# Patient Record
Sex: Female | Born: 1955
Health system: Southern US, Community
[De-identification: ages and names within clinical notes are randomized; demographics above are authoritative.]

## PROBLEM LIST (undated history)

## (undated) DIAGNOSIS — F329 Major depressive disorder, single episode, unspecified: Secondary | ICD-10-CM

## (undated) DIAGNOSIS — F32A Depression, unspecified: Secondary | ICD-10-CM

## (undated) DIAGNOSIS — G4730 Sleep apnea, unspecified: Secondary | ICD-10-CM

## (undated) DIAGNOSIS — E039 Hypothyroidism, unspecified: Secondary | ICD-10-CM

## (undated) DIAGNOSIS — E079 Disorder of thyroid, unspecified: Secondary | ICD-10-CM

## (undated) DIAGNOSIS — E7800 Pure hypercholesterolemia, unspecified: Secondary | ICD-10-CM

## (undated) DIAGNOSIS — E119 Type 2 diabetes mellitus without complications: Secondary | ICD-10-CM

## (undated) DIAGNOSIS — T8859XA Other complications of anesthesia, initial encounter: Secondary | ICD-10-CM

## (undated) DIAGNOSIS — I10 Essential (primary) hypertension: Secondary | ICD-10-CM

## (undated) DIAGNOSIS — T4145XA Adverse effect of unspecified anesthetic, initial encounter: Secondary | ICD-10-CM

## (undated) DIAGNOSIS — I639 Cerebral infarction, unspecified: Secondary | ICD-10-CM

## (undated) HISTORY — PX: APPENDECTOMY: SHX54

## (undated) HISTORY — PX: OTHER SURGICAL HISTORY: SHX169

## (undated) HISTORY — PX: TRACHEOSTOMY: SUR1362

## (undated) HISTORY — PX: ABDOMINAL HYSTERECTOMY: SHX81

---

## 1997-11-30 ENCOUNTER — Emergency Department (HOSPITAL_COMMUNITY): Admission: EM | Admit: 1997-11-30 | Discharge: 1997-11-30 | Payer: Self-pay | Admitting: Emergency Medicine

## 2010-05-19 ENCOUNTER — Emergency Department (HOSPITAL_COMMUNITY)
Admission: EM | Admit: 2010-05-19 | Discharge: 2010-05-19 | Payer: Self-pay | Source: Home / Self Care | Admitting: Emergency Medicine

## 2012-06-06 ENCOUNTER — Emergency Department (HOSPITAL_COMMUNITY)
Admission: EM | Admit: 2012-06-06 | Discharge: 2012-06-06 | Disposition: A | Discharge status: Home / Self Care | Payer: Self-pay | Attending: Emergency Medicine | Admitting: Emergency Medicine

## 2012-06-06 ENCOUNTER — Encounter (HOSPITAL_COMMUNITY): Payer: Self-pay | Admitting: Family Medicine

## 2012-06-06 ENCOUNTER — Emergency Department (HOSPITAL_COMMUNITY): Payer: Self-pay

## 2012-06-06 DIAGNOSIS — Z8639 Personal history of other endocrine, nutritional and metabolic disease: Secondary | ICD-10-CM | POA: Insufficient documentation

## 2012-06-06 DIAGNOSIS — E7800 Pure hypercholesterolemia, unspecified: Secondary | ICD-10-CM | POA: Insufficient documentation

## 2012-06-06 DIAGNOSIS — Z862 Personal history of diseases of the blood and blood-forming organs and certain disorders involving the immune mechanism: Secondary | ICD-10-CM | POA: Insufficient documentation

## 2012-06-06 DIAGNOSIS — Z791 Long term (current) use of non-steroidal anti-inflammatories (NSAID): Secondary | ICD-10-CM | POA: Insufficient documentation

## 2012-06-06 DIAGNOSIS — Z79899 Other long term (current) drug therapy: Secondary | ICD-10-CM | POA: Insufficient documentation

## 2012-06-06 DIAGNOSIS — I10 Essential (primary) hypertension: Secondary | ICD-10-CM | POA: Insufficient documentation

## 2012-06-06 DIAGNOSIS — J40 Bronchitis, not specified as acute or chronic: Secondary | ICD-10-CM | POA: Insufficient documentation

## 2012-06-06 DIAGNOSIS — R609 Edema, unspecified: Secondary | ICD-10-CM | POA: Insufficient documentation

## 2012-06-06 DIAGNOSIS — R0602 Shortness of breath: Secondary | ICD-10-CM | POA: Insufficient documentation

## 2012-06-06 HISTORY — DX: Disorder of thyroid, unspecified: E07.9

## 2012-06-06 HISTORY — DX: Essential (primary) hypertension: I10

## 2012-06-06 HISTORY — DX: Pure hypercholesterolemia, unspecified: E78.00

## 2012-06-06 LAB — POCT I-STAT, CHEM 8
BUN: 17 mg/dL (ref 6–23)
Calcium, Ion: 1.21 mmol/L (ref 1.12–1.23)
Chloride: 107 mEq/L (ref 96–112)
Creatinine, Ser: 0.9 mg/dL (ref 0.50–1.10)
Glucose, Bld: 79 mg/dL (ref 70–99)
HCT: 45 % (ref 36.0–46.0)
Hemoglobin: 15.3 g/dL — ABNORMAL HIGH (ref 12.0–15.0)
Potassium: 4.3 mEq/L (ref 3.5–5.1)
Sodium: 136 mEq/L (ref 135–145)
TCO2: 26 mmol/L (ref 0–100)

## 2012-06-06 LAB — PRO B NATRIURETIC PEPTIDE: Pro B Natriuretic peptide (BNP): 9.2 pg/mL (ref 0–125)

## 2012-06-06 MED ORDER — AZITHROMYCIN 250 MG PO TABS: 250.0000 mg | ORAL_TABLET | Freq: Every day | ORAL | Status: DC

## 2012-06-06 MED ORDER — HYDROCOD POLST-CHLORPHEN POLST 10-8 MG/5ML PO LQCR: 5.0000 mL | Freq: Two times a day (BID) | ORAL | Status: DC | PRN

## 2012-06-06 MED ORDER — LISINOPRIL-HYDROCHLOROTHIAZIDE 20-12.5 MG PO TABS: 1.0000 | ORAL_TABLET | Freq: Every day | ORAL | Status: DC

## 2012-06-06 NOTE — ED Provider Notes (Signed)
History   This chart was scribed for Nelia Shi, MD, by Frederik Pear, ER scribe. The patient was seen in room TR05C/TR05C and the patient's care was started at 1318.   CSN: [id number]  Arrival date & time 06/06/12  1305   First MD Initiated Contact with Patient 06/06/12 1318      Chief Complaint  Patient presents with  . Cough    (Consider location/radiation/quality/duration/timing/severity/associated sxs/prior treatment) HPI Comments: Morgan Collins is a 56 y.o. female with a h/o of hypertension and high cholesterol who presents to the Emergency Department complaining of constant, moderate productive cough with associated SOB and abdominal pain that began 2 weeks ago. She also states that when the symptoms because she had associated wheezing, which has since resolved. She also reports associated lower leg edema, and has not been taking her hypertension medication. In ED her BP is 172/115. She reports that she is a nonsmoker. She states that she currently sleeps on two pillows at night and has for a long time because one pillow is uncomfortable.  No current PCP. Dr. Renaye Rakers is previous PCP.   Past Medical History  Diagnosis Date  . Hypertension   . Thyroid disease   . High cholesterol     History reviewed. No pertinent past surgical history.  History reviewed. No pertinent family history.  History  Substance Use Topics  . Smoking status: Never Smoker   . Smokeless tobacco: Not on file  . Alcohol Use: No    OB History    Grav Para Term Preterm Abortions TAB SAB Ect Mult Living                  Review of Systems A complete 10 system review of systems was obtained and all systems are negative except as noted in the HPI and PMH.   Allergies  Review of patient's allergies indicates no known allergies.  Home Medications   Current Outpatient Rx  Name  Route  Sig  Dispense  Refill  . BENAZEPRIL HCL 20 MG PO TABS   Oral   Take 20 mg by mouth at bedtime.         . CORICIDIN HBP CONGESTION/COUGH PO   Oral   Take 1 tablet by mouth 2 (two) times daily as needed. For congestion & cold         . NAPROXEN SODIUM 220 MG PO TABS   Oral   Take 220 mg by mouth 2 (two) times daily with a meal.         . AZITHROMYCIN 250 MG PO TABS   Oral   Take 1 tablet (250 mg total) by mouth daily. Take first 2 tablets together, then 1 every day until finished.   6 tablet   0   . LISINOPRIL-HYDROCHLOROTHIAZIDE 20-12.5 MG PO TABS   Oral   Take 1 tablet by mouth daily.   30 tablet   0     BP 172/115  Temp 98.4 F (36.9 C) (Oral)  Resp 22  SpO2 97%  Physical Exam  Nursing note and vitals reviewed. Constitutional: She is oriented to person, place, and time. She appears well-developed and well-nourished. No distress.  HENT:  Head: Normocephalic and atraumatic.  Eyes: Pupils are equal, round, and reactive to light.  Neck: Normal range of motion.  Cardiovascular: Normal rate and intact distal pulses.   Pulmonary/Chest: No respiratory distress. She has no wheezes. She has no rales.  Abdominal: Normal appearance. She exhibits no distension.  Musculoskeletal: Normal range of motion.  Neurological: She is alert and oriented to person, place, and time. No cranial nerve deficit.  Skin: Skin is warm and dry. No rash noted.  Psychiatric: She has a normal mood and affect. Her behavior is normal.    ED Course  Procedures (including critical care time)  DIAGNOSTIC STUDIES: Oxygen Saturation is 97% on room air, normal by my interpretation.    COORDINATION OF CARE:  14:21- Discussed planned course of treatment with the patient, including blood work, who is agreeable at this time.  Results for orders placed during the hospital encounter of 06/06/12  PRO B NATRIURETIC PEPTIDE      Component Value Range   Pro B Natriuretic peptide (BNP) 9.2  0 - 125 pg/mL  POCT I-STAT, CHEM 8      Component Value Range   Sodium 136  135 - 145 mEq/L   Potassium 4.3   3.5 - 5.1 mEq/L   Chloride 107  96 - 112 mEq/L   BUN 17  6 - 23 mg/dL   Creatinine, Ser 1.61  0.50 - 1.10 mg/dL   Glucose, Bld 79  70 - 99 mg/dL   Calcium, Ion 0.96  0.45 - 1.23 mmol/L   TCO2 26  0 - 100 mmol/L   Hemoglobin 15.3 (*) 12.0 - 15.0 g/dL   HCT 40.9  81.1 - 91.4 %     Labs Reviewed  POCT I-STAT, CHEM 8 - Abnormal; Notable for the following:    Hemoglobin 15.3 (*)     All other components within normal limits  PRO B NATRIURETIC PEPTIDE   Dg Chest 2 View  06/06/2012  *RADIOLOGY REPORT*  Clinical Data: Cough, wheezing, shortness of breath  CHEST - 2 VIEW  Comparison: None.  Findings: Borderline cardiomegaly noted.  No acute infiltrate or pulmonary edema.  Central mild bronchitic changes.  Mild degenerative changes thoracic spine.  IMPRESSION: No acute infiltrate or pulmonary edema.  Central mild bronchitic changes.   Original Report Authenticated By: Natasha Mead, M.D.      1. Bronchitis   2. Hypertension       MDM  I personally performed the services described in this documentation, which was scribed in my presence. The recorded information has been reviewed and considered.      Nelia Shi, MD 06/06/12 [phone]

## 2012-06-06 NOTE — ED Notes (Signed)
Per pt sts productive cough x 2 weeks. sts also she has been out of her BP meds for a while.

## 2012-12-11 ENCOUNTER — Emergency Department (HOSPITAL_COMMUNITY)
Admission: EM | Admit: 2012-12-11 | Discharge: 2012-12-11 | Disposition: A | Discharge status: Home / Self Care | Payer: BC Managed Care – PPO | Attending: Emergency Medicine | Admitting: Emergency Medicine

## 2012-12-11 ENCOUNTER — Emergency Department (HOSPITAL_COMMUNITY)
Admission: EM | Admit: 2012-12-11 | Discharge: 2012-12-11 | Disposition: A | Discharge status: Other Acute Inpatient Hospital | Payer: BC Managed Care – PPO | Source: Home / Self Care | Attending: Emergency Medicine | Admitting: Emergency Medicine

## 2012-12-11 ENCOUNTER — Encounter (HOSPITAL_COMMUNITY): Payer: Self-pay | Admitting: Emergency Medicine

## 2012-12-11 ENCOUNTER — Emergency Department (HOSPITAL_COMMUNITY): Payer: BC Managed Care – PPO

## 2012-12-11 ENCOUNTER — Encounter (HOSPITAL_COMMUNITY): Payer: Self-pay

## 2012-12-11 DIAGNOSIS — R079 Chest pain, unspecified: Secondary | ICD-10-CM

## 2012-12-11 DIAGNOSIS — R0609 Other forms of dyspnea: Secondary | ICD-10-CM

## 2012-12-11 DIAGNOSIS — R110 Nausea: Secondary | ICD-10-CM | POA: Insufficient documentation

## 2012-12-11 DIAGNOSIS — Z862 Personal history of diseases of the blood and blood-forming organs and certain disorders involving the immune mechanism: Secondary | ICD-10-CM | POA: Insufficient documentation

## 2012-12-11 DIAGNOSIS — R0602 Shortness of breath: Secondary | ICD-10-CM | POA: Insufficient documentation

## 2012-12-11 DIAGNOSIS — R0600 Dyspnea, unspecified: Secondary | ICD-10-CM

## 2012-12-11 DIAGNOSIS — Z791 Long term (current) use of non-steroidal anti-inflammatories (NSAID): Secondary | ICD-10-CM | POA: Insufficient documentation

## 2012-12-11 DIAGNOSIS — R609 Edema, unspecified: Secondary | ICD-10-CM

## 2012-12-11 DIAGNOSIS — Z79899 Other long term (current) drug therapy: Secondary | ICD-10-CM | POA: Insufficient documentation

## 2012-12-11 DIAGNOSIS — R0989 Other specified symptoms and signs involving the circulatory and respiratory systems: Secondary | ICD-10-CM

## 2012-12-11 DIAGNOSIS — R0789 Other chest pain: Secondary | ICD-10-CM | POA: Insufficient documentation

## 2012-12-11 DIAGNOSIS — Z792 Long term (current) use of antibiotics: Secondary | ICD-10-CM | POA: Insufficient documentation

## 2012-12-11 DIAGNOSIS — Z8639 Personal history of other endocrine, nutritional and metabolic disease: Secondary | ICD-10-CM | POA: Insufficient documentation

## 2012-12-11 DIAGNOSIS — R61 Generalized hyperhidrosis: Secondary | ICD-10-CM | POA: Insufficient documentation

## 2012-12-11 DIAGNOSIS — I10 Essential (primary) hypertension: Secondary | ICD-10-CM | POA: Insufficient documentation

## 2012-12-11 DIAGNOSIS — Z8659 Personal history of other mental and behavioral disorders: Secondary | ICD-10-CM | POA: Insufficient documentation

## 2012-12-11 HISTORY — DX: Major depressive disorder, single episode, unspecified: F32.9

## 2012-12-11 HISTORY — DX: Depression, unspecified: F32.A

## 2012-12-11 LAB — COMPREHENSIVE METABOLIC PANEL
ALT: 18 U/L (ref 0–35)
AST: 18 U/L (ref 0–37)
Albumin: 3.5 g/dL (ref 3.5–5.2)
Alkaline Phosphatase: 95 U/L (ref 39–117)
BUN: 18 mg/dL (ref 6–23)
CO2: 26 mEq/L (ref 19–32)
Calcium: 10.7 mg/dL — ABNORMAL HIGH (ref 8.4–10.5)
Chloride: 104 mEq/L (ref 96–112)
Creatinine, Ser: 0.84 mg/dL (ref 0.50–1.10)
GFR calc Af Amer: 88 mL/min — ABNORMAL LOW (ref >90)
GFR calc non Af Amer: 76 mL/min — ABNORMAL LOW (ref >90)
Glucose, Bld: 115 mg/dL — ABNORMAL HIGH (ref 70–99)
Potassium: 3.9 mEq/L (ref 3.5–5.1)
Sodium: 139 mEq/L (ref 135–145)
Total Bilirubin: 0.1 mg/dL — ABNORMAL LOW (ref 0.3–1.2)
Total Protein: 7.5 g/dL (ref 6.0–8.3)

## 2012-12-11 LAB — CBC WITH DIFFERENTIAL/PLATELET
Basophils Absolute: 0 10*3/uL (ref 0.0–0.1)
Basophils Relative: 0 % (ref 0–1)
Eosinophils Absolute: 0.1 10*3/uL (ref 0.0–0.7)
Eosinophils Relative: 1 % (ref 0–5)
HCT: 39 % (ref 36.0–46.0)
Hemoglobin: 12.8 g/dL (ref 12.0–15.0)
Lymphocytes Relative: 31 % (ref 12–46)
Lymphs Abs: 2.8 10*3/uL (ref 0.7–4.0)
MCH: 28.5 pg (ref 26.0–34.0)
MCHC: 32.8 g/dL (ref 30.0–36.0)
MCV: 86.9 fL (ref 78.0–100.0)
Monocytes Absolute: 0.7 10*3/uL (ref 0.1–1.0)
Monocytes Relative: 8 % (ref 3–12)
Neutro Abs: 5.3 10*3/uL (ref 1.7–7.7)
Neutrophils Relative %: 60 % (ref 43–77)
Platelets: 238 10*3/uL (ref 150–400)
RBC: 4.49 MIL/uL (ref 3.87–5.11)
RDW: 14.3 % (ref 11.5–15.5)
WBC: 8.8 10*3/uL (ref 4.0–10.5)

## 2012-12-11 LAB — POCT I-STAT TROPONIN I: Troponin i, poc: 0.01 ng/mL (ref 0.00–0.08)

## 2012-12-11 LAB — PRO B NATRIURETIC PEPTIDE: Pro B Natriuretic peptide (BNP): 20.7 pg/mL (ref 0–125)

## 2012-12-11 MED ORDER — SODIUM CHLORIDE 0.9 % IV SOLN
Freq: Once | INTRAVENOUS | Status: AC
  Administered 2012-12-11: 19:00:00 via INTRAVENOUS

## 2012-12-11 MED ORDER — LEVOTHYROXINE SODIUM 50 MCG PO TABS: 75.0000 ug | ORAL_TABLET | Freq: Every day | ORAL | Status: DC

## 2012-12-11 MED ORDER — LISINOPRIL-HYDROCHLOROTHIAZIDE 20-12.5 MG PO TABS: 1.0000 | ORAL_TABLET | Freq: Every day | ORAL | Status: DC

## 2012-12-11 NOTE — ED Notes (Addendum)
Reports 2 week duration discomfort left mid chest/clavical area , worse w exertion, movement of arms , deep breath. C/o swelling in both legs that usually goes away w rest and elevation, but not so much lately . NAD at present. Also co pain and numbness  in left thumb States she has not taken her BP or thyroid medication in at least 6 months

## 2012-12-11 NOTE — ED Notes (Signed)
Patient is an [facility] transfer, complaining of chest pain for last two weeks, patient is chest pain free at this time.  Patient at [facility] with hypertension 136/106.  Patient is 137/86 at this time of transfer.  Patient does not have any complaints at this time.

## 2012-12-11 NOTE — ED Notes (Signed)
Pt denies any questions upon discharge. 

## 2012-12-11 NOTE — ED Provider Notes (Signed)
I saw and evaluated the patient, reviewed the resident's note and I agree with the findings and plan.   Pt with h/o HTN, no h/o ACS, intermittent, atypical CP for past several weeks, seen at urgent care, sent to the ED for further eval.  Pt has no sig ST or T wave abn';s to suggest acute ischemia, troponin is negative.  Pt denies any CP currently.  I agree with Dr. Fayrene Fearing' ECG interpretation.  Will recommend close follow up with cardiology and will refer to [facility] cardiology.  Pt and family are comfortable going home, watchful waiting over weekend, can return if worse, will follow up with cardiology.      Gavin Pound. Cordell Coke, MD 12/11/12 4098

## 2012-12-11 NOTE — ED Provider Notes (Signed)
Medical screening examination/treatment/procedure(s) were performed by non-physician practitioner and as supervising physician I was immediately available for consultation/collaboration.  Leslee Home, M.D.  Reuben Likes, MD 12/11/12 [phone]

## 2012-12-11 NOTE — ED Provider Notes (Signed)
History    CSN: [id number] Arrival date & time 12/11/12  1954  First MD Initiated Contact with Patient 12/11/12 2019     Chief Complaint  Patient presents with  . Chest Pain  . Hypertension   (Consider location/radiation/quality/duration/timing/severity/associated sxs/prior Treatment) Patient is a 57 y.o. female presenting with chest pain. The history is provided by the patient.  Chest Pain Pain location:  L chest Pain quality: aching   Pain radiates to:  Does not radiate Pain radiates to the back: no   Pain severity:  Mild Onset quality:  Gradual Duration:  2 weeks Timing:  Intermittent Progression:  Worsening (becmoing more frequent. ) Chronicity:  New Context comment:  Pt cannot associated with anything Relieved by:  Nothing Worsened by:  Nothing tried Ineffective treatments:  None tried Associated symptoms: diaphoresis, nausea and shortness of breath (with exertion)   Associated symptoms: no abdominal pain, no cough, no dizziness, no fever, not vomiting and no weakness    Past Medical History  Diagnosis Date  . Hypertension   . Thyroid disease   . High cholesterol   . Depressed    History reviewed. No pertinent past surgical history. No family history on file. History  Substance Use Topics  . Smoking status: Never Smoker   . Smokeless tobacco: Not on file  . Alcohol Use: No   OB History   Grav Para Term Preterm Abortions TAB SAB Ect Mult Living                 Review of Systems  Constitutional: Positive for diaphoresis. Negative for fever and chills.  HENT: Negative for congestion and rhinorrhea.   Respiratory: Positive for shortness of breath (with exertion). Negative for cough and chest tightness.   Cardiovascular: Positive for chest pain. Negative for leg swelling.  Gastrointestinal: Positive for nausea. Negative for vomiting, abdominal pain, diarrhea and rectal pain.  Genitourinary: Negative for dysuria.  Neurological: Negative for dizziness and  weakness.  All other systems reviewed and are negative.    Allergies  Review of patient's allergies indicates no known allergies.  Home Medications   Current Outpatient Rx  Name  Route  Sig  Dispense  Refill  . azithromycin (ZITHROMAX) 250 MG tablet   Oral   Take 1 tablet (250 mg total) by mouth daily. Take first 2 tablets together, then 1 every day until finished.   6 tablet   0   . benazepril (LOTENSIN) 20 MG tablet   Oral   Take 20 mg by mouth at bedtime.         . chlorpheniramine-HYDROcodone (TUSSIONEX PENNKINETIC ER) 10-8 MG/5ML LQCR   Oral   Take 5 mLs by mouth every 12 (twelve) hours as needed.   140 mL   0   . Dextromethorphan-Guaifenesin (CORICIDIN HBP CONGESTION/COUGH PO)   Oral   Take 1 tablet by mouth 2 (two) times daily as needed. For congestion & cold         . lisinopril-hydrochlorothiazide (PRINZIDE) 20-12.5 MG per tablet   Oral   Take 1 tablet by mouth daily.   30 tablet   0   . naproxen sodium (ANAPROX) 220 MG tablet   Oral   Take 220 mg by mouth 2 (two) times daily with a meal.          BP 153/110  Pulse 80  Temp(Src) 97.4 F (36.3 C) (Oral)  Resp 16  SpO2 97% Physical Exam  Nursing note and vitals reviewed. Constitutional: She is oriented  to person, place, and time. She appears well-developed and well-nourished. No distress.  HENT:  Head: Normocephalic and atraumatic.  Mouth/Throat: Oropharynx is clear and moist.  Eyes: EOM are normal. Pupils are equal, round, and reactive to light.  Neck: Normal range of motion. Neck supple.  Cardiovascular: Normal rate, regular rhythm and normal heart sounds.  Exam reveals no friction rub.   No murmur heard. Pulmonary/Chest: Effort normal and breath sounds normal. No respiratory distress. She has no wheezes. She has no rales.  Abdominal: Soft. There is no tenderness. There is no rebound and no guarding.  Obese.   Musculoskeletal: Normal range of motion. She exhibits edema (trace bilateral  pitting edema BLEs. ). She exhibits no tenderness.  Lymphadenopathy:    She has no cervical adenopathy.  Neurological: She is alert and oriented to person, place, and time.  Skin: Skin is warm and dry. No rash noted.  Psychiatric: She has a normal mood and affect. Her behavior is normal.    ED Course  Procedures (including critical care time) Labs Reviewed  COMPREHENSIVE METABOLIC PANEL - Abnormal; Notable for the following:    Glucose, Bld 115 (*)    Calcium 10.7 (*)    Total Bilirubin 0.1 (*)    GFR calc non Af Amer 76 (*)    GFR calc Af Amer 88 (*)    All other components within normal limits  CBC WITH DIFFERENTIAL  PRO B NATRIURETIC PEPTIDE  POCT I-STAT TROPONIN I   Dg Chest 2 View  12/11/2012   *RADIOLOGY REPORT*  Clinical Data: Chest pain, shortness of breath, lower extremity swelling  CHEST - 2 VIEW  Comparison: 06/06/2012  Findings:  Grossly unchanged borderline enlarged cardiac silhouette. Atherosclerotic calcifications within the aortic arch.  There is mild diffuse thickening of the pulmonary interstitium.  Grossly unchanged minimal left basilar linear heterogeneous opacities favored to represent atelectasis.  No focal airspace opacity.  No pleural effusion or pneumothorax.  No definite evidence of edema. Unchanged bones.  IMPRESSION: Mild bronchitic change without acute cardiopulmonary disease.   Original Report Authenticated By: Tacey Ruiz, MD   Date: 12/11/2012  Rate: 83  Rhythm: normal sinus rhythm  QRS Axis: normal  Intervals: normal  ST/T Wave abnormalities: normal  Conduction Disutrbances:none  Narrative Interpretation: LVH  Old EKG Reviewed: unchanged   1. Chest pain   2. Hypertension     MDM  84:88 PM 57 year old female with a history of hypertension, hyperlipidemia and hypothyroidism presenting with 2 weeks of intermittent left chest pain has progressively getting more frequent with nausea and diaphoresis. She also endorses worsening dyspnea on exertion.  She states she has to sleep with 3 pillows due to the fact she is "uncomfortable". Patient was in urgent care today and was sent to the ED for further evaluation of her chest pain and elevated blood pressure. She has not been on antihypertensives for the past 6 months as she lost her insurance. She appears well on exam. Lung sounds are clear. Will check labs, troponin, BNP, EKG and chest x-ray.  11:28 PM labs show no significant abnormality. Chest x-ray cleaned. Patient has remained chest pain-free while here. Do not feel delta tn is necessary at this has been ongoing for 2 weeks and she has had no pain here. Discussed with cardiology on the phone and they will followup. She is also provided with resources to call to establish with a primary physician. This was discussed with the patient, she voiced understanding. She was given strong return  precautions and discharged home in stable condition.  Caren Hazy, MD 12/11/12 [phone]

## 2012-12-11 NOTE — ED Provider Notes (Signed)
History    CSN: [id number] Arrival date & time 12/11/12  1701  First MD Initiated Contact with Patient 12/11/12 1824     Chief Complaint  Patient presents with  . Chest Pain   (Consider location/radiation/quality/duration/timing/severity/associated sxs/prior Treatment) HPI Comments: 57 year old morbidly obese female presents with left anterior chest pain for approximately 2 weeks. She describes it as both a tightness and pressure feeling. It is intermittent and exacerbated with exertion and sometimes movement. Is also complaining of exertional dyspnea and occasional unexpected diaphoresis. She has intermittent lower extremity edema that in the past few days the edema has not ameliorated as it usually does. Her history and risk factors include uncontrolled hypertension, peripheral edema, morbid obesity, 3 pillow orthopnea, borderline cardiomegaly, EKG suggestive of ventricular hypertrophy and sedentary lifestyle.  Patient is a 57 y.o. female presenting with chest pain.  Chest Pain Associated symptoms: fatigue and shortness of breath   Associated symptoms: no cough and no fever    Past Medical History  Diagnosis Date  . Hypertension   . Thyroid disease   . High cholesterol   . Depressed    History reviewed. No pertinent past surgical history. History reviewed. No pertinent family history. History  Substance Use Topics  . Smoking status: Never Smoker   . Smokeless tobacco: Not on file  . Alcohol Use: No   OB History   Grav Para Term Preterm Abortions TAB SAB Ect Mult Living                 Review of Systems  Constitutional: Positive for fatigue. Negative for fever and appetite change.  HENT: Negative.   Respiratory: Positive for chest tightness and shortness of breath. Negative for cough and wheezing.   Cardiovascular: Positive for chest pain and leg swelling.  Gastrointestinal: Negative.   Genitourinary: Negative.   Neurological: Negative.     Allergies  Review of  patient's allergies indicates no known allergies.  Home Medications   Current Outpatient Rx  Name  Route  Sig  Dispense  Refill  . azithromycin (ZITHROMAX) 250 MG tablet   Oral   Take 1 tablet (250 mg total) by mouth daily. Take first 2 tablets together, then 1 every day until finished.   6 tablet   0   . benazepril (LOTENSIN) 20 MG tablet   Oral   Take 20 mg by mouth at bedtime.         . chlorpheniramine-HYDROcodone (TUSSIONEX PENNKINETIC ER) 10-8 MG/5ML LQCR   Oral   Take 5 mLs by mouth every 12 (twelve) hours as needed.   140 mL   0   . Dextromethorphan-Guaifenesin (CORICIDIN HBP CONGESTION/COUGH PO)   Oral   Take 1 tablet by mouth 2 (two) times daily as needed. For congestion & cold         . lisinopril-hydrochlorothiazide (PRINZIDE) 20-12.5 MG per tablet   Oral   Take 1 tablet by mouth daily.   30 tablet   0   . naproxen sodium (ANAPROX) 220 MG tablet   Oral   Take 220 mg by mouth 2 (two) times daily with a meal.          BP 136/106  Pulse 90  Temp(Src) 97.8 F (36.6 C) (Oral)  Resp 12  SpO2 100% Physical Exam  Nursing note and vitals reviewed. Constitutional: She is oriented to person, place, and time. She appears well-nourished. No distress.  Morbidly obese. Currently sitting up on bed with relaxed posturing and in no acute distress.  She is obviously concerned about her symptoms and is currently having chest tightness in the left anterior chest now. No dyspnea at rest.  Eyes: Conjunctivae and EOM are normal.  Neck: Normal range of motion.  Cardiovascular: Normal rate, regular rhythm and normal heart sounds.   Pulmonary/Chest: Effort normal and breath sounds normal. No respiratory distress.  Musculoskeletal: She exhibits edema.  Neurological: She is alert and oriented to person, place, and time.  Skin: Skin is warm and dry. No rash noted. No erythema.  Psychiatric: She has a normal mood and affect.    ED Course  Procedures (including critical  care time) Labs Reviewed - No data to display No results found. No diagnosis found.  MDM  Patient is being transferred to [facility] for evaluation of left anterior chest pain for the past 2 weeks. Associated symptoms include orthopnea, exertional dyspnea, peripheral edema and diaphoresis She is currently stable with a normal O2 sat. Blood pressure 136/106  Hayden Rasmussen, NP 12/11/12 [place]  Hayden Rasmussen, NP 12/11/12 [phone]

## 2012-12-11 NOTE — ED Notes (Signed)
Report to ED  CN, Weston Anna; advised pending transfer ; called [facility], will send truck ASAP

## 2013-06-17 DIAGNOSIS — I639 Cerebral infarction, unspecified: Secondary | ICD-10-CM

## 2013-06-17 HISTORY — DX: Cerebral infarction, unspecified: I63.9

## 2013-06-27 ENCOUNTER — Emergency Department (INDEPENDENT_AMBULATORY_CARE_PROVIDER_SITE_OTHER)
Admission: EM | Admit: 2013-06-27 | Discharge: 2013-06-27 | Disposition: A | Discharge status: Home / Self Care | Payer: BC Managed Care – PPO | Source: Home / Self Care | Attending: Family Medicine | Admitting: Family Medicine

## 2013-06-27 ENCOUNTER — Encounter (HOSPITAL_COMMUNITY): Payer: Self-pay | Admitting: Emergency Medicine

## 2013-06-27 DIAGNOSIS — J40 Bronchitis, not specified as acute or chronic: Secondary | ICD-10-CM

## 2013-06-27 MED ORDER — ALBUTEROL SULFATE HFA 108 (90 BASE) MCG/ACT IN AERS: 1.0000 | INHALATION_SPRAY | Freq: Four times a day (QID) | RESPIRATORY_TRACT | Status: DC | PRN

## 2013-06-27 MED ORDER — AZITHROMYCIN 250 MG PO TABS: 250.0000 mg | ORAL_TABLET | Freq: Every day | ORAL | Status: DC

## 2013-06-27 NOTE — ED Notes (Signed)
Patient reports wheezing this morning, reports cough, productive cough-yellow phlegm.  Reports aching all over.  Denies fever.  Patient reports intermittent left chest pain, seen here for the same and sent to the ed for evaluation and told not heart related.  No chest pain now-pain is present with coughing spells per patient.  Pain is not dull, somewhat sharp.  Also c/o sneezing and poor appetite.

## 2013-06-27 NOTE — Discharge Instructions (Signed)

## 2013-06-27 NOTE — ED Notes (Signed)
Patient requested work note at discharge

## 2013-06-27 NOTE — ED Provider Notes (Signed)
CSN: [id number]     Arrival date & time 06/27/13  1221 History   First MD Initiated Contact with Patient 06/27/13 1444     Chief Complaint  Patient presents with  . URI  . Chest Pain   (Consider location/radiation/quality/duration/timing/severity/associated sxs/prior Treatment) Patient is a 58 y.o. female presenting with URI and chest pain. The history is provided by the patient. No language interpreter was used.  URI Presenting symptoms: congestion and cough   Severity:  Moderate Onset quality:  Sudden Timing:  Constant Progression:  Worsening Chronicity:  New Relieved by:  Nothing Worsened by:  Nothing tried Ineffective treatments:  None tried Associated symptoms: headaches and wheezing   Chest Pain Associated symptoms: cough and headache     Past Medical History  Diagnosis Date  . Hypertension   . Thyroid disease   . High cholesterol   . Depressed    No past surgical history on file. No family history on file. History  Substance Use Topics  . Smoking status: Never Smoker   . Smokeless tobacco: Not on file  . Alcohol Use: No   OB History   Grav Para Term Preterm Abortions TAB SAB Ect Mult Living                 Review of Systems  HENT: Positive for congestion.   Respiratory: Positive for cough and wheezing.   Cardiovascular: Positive for chest pain.  Neurological: Positive for headaches.  All other systems reviewed and are negative.    Allergies  Review of patient's allergies indicates no known allergies.  Home Medications   Current Outpatient Rx  Name  Route  Sig  Dispense  Refill  . guaiFENesin (MUCINEX) 600 MG 12 hr tablet   Oral   Take by mouth 2 (two) times daily.         Marland Kitchen albuterol (PROVENTIL HFA;VENTOLIN HFA) 108 (90 BASE) MCG/ACT inhaler   Inhalation   Inhale 1-2 puffs into the lungs every 6 (six) hours as needed for wheezing or shortness of breath.   1 Inhaler   0   . azithromycin (ZITHROMAX) 250 MG tablet   Oral   Take 1 tablet  (250 mg total) by mouth daily. Take first 2 tablets together, then 1 every day until finished.   6 tablet   0   . benazepril (LOTENSIN) 20 MG tablet   Oral   Take 20 mg by mouth at bedtime.         Marland Kitchen levothyroxine (SYNTHROID, LEVOTHROID) 50 MCG tablet   Oral   Take 1.5 tablets (75 mcg total) by mouth daily before breakfast.   45 tablet   1   . lisinopril-hydrochlorothiazide (PRINZIDE) 20-12.5 MG per tablet   Oral   Take 1 tablet by mouth daily.   30 tablet   0   . lisinopril-hydrochlorothiazide (ZESTORETIC) 20-12.5 MG per tablet   Oral   Take 1 tablet by mouth daily.   30 tablet   1    BP 101/70  Pulse 108  Temp(Src) 98.9 F (37.2 C) (Oral)  Resp 18  SpO2 100% Physical Exam  Nursing note and vitals reviewed. Constitutional: She is oriented to person, place, and time. She appears well-developed and well-nourished.  HENT:  Head: Normocephalic.  Right Ear: External ear normal.  Left Ear: External ear normal.  Eyes: EOM are normal. Pupils are equal, round, and reactive to light.  Neck: Normal range of motion.  Pulmonary/Chest: Effort normal and breath sounds normal.  Abdominal: Soft.  She exhibits no distension.  Musculoskeletal: Normal range of motion.  Neurological: She is alert and oriented to person, place, and time. She has normal reflexes.  Psychiatric: She has a normal mood and affect.    ED Course  Procedures (including critical care time) Labs Review Labs Reviewed - No data to display Imaging Review No results found.  EKG Interpretation    Date/Time:    Ventricular Rate:    PR Interval:    QRS Duration:   QT Interval:    QTC Calculation:   R Axis:     Text Interpretation:              MDM   1. Bronchitis    zithromax and albuterol    Fransico Meadow, PA-C 06/27/13 1457

## 2013-06-28 NOTE — ED Provider Notes (Signed)
Medical screening examination/treatment/procedure(s) were performed by resident physician or non-physician practitioner and as supervising physician I was immediately available for consultation/collaboration.   Pauline Good MD.   Billy Fischer, MD 06/28/13 [phone]

## 2013-07-27 ENCOUNTER — Inpatient Hospital Stay (HOSPITAL_COMMUNITY): Payer: BC Managed Care – PPO

## 2013-07-27 ENCOUNTER — Emergency Department (HOSPITAL_COMMUNITY): Payer: BC Managed Care – PPO

## 2013-07-27 ENCOUNTER — Inpatient Hospital Stay (HOSPITAL_COMMUNITY)
Admission: EM | Admit: 2013-07-27 | Discharge: 2013-08-04 | DRG: 023 | Disposition: A | Discharge status: Rehabilitation Facility | Payer: BC Managed Care – PPO | Attending: Neurology | Admitting: Neurology

## 2013-07-27 ENCOUNTER — Encounter (HOSPITAL_COMMUNITY): Payer: Self-pay | Admitting: Radiology

## 2013-07-27 DIAGNOSIS — G911 Obstructive hydrocephalus: Secondary | ICD-10-CM

## 2013-07-27 DIAGNOSIS — K5900 Constipation, unspecified: Secondary | ICD-10-CM | POA: Diagnosis not present

## 2013-07-27 DIAGNOSIS — G936 Cerebral edema: Secondary | ICD-10-CM | POA: Diagnosis present

## 2013-07-27 DIAGNOSIS — F329 Major depressive disorder, single episode, unspecified: Secondary | ICD-10-CM | POA: Diagnosis present

## 2013-07-27 DIAGNOSIS — R51 Headache: Secondary | ICD-10-CM | POA: Diagnosis present

## 2013-07-27 DIAGNOSIS — Z6841 Body Mass Index (BMI) 40.0 and over, adult: Secondary | ICD-10-CM

## 2013-07-27 DIAGNOSIS — N390 Urinary tract infection, site not specified: Secondary | ICD-10-CM

## 2013-07-27 DIAGNOSIS — I10 Essential (primary) hypertension: Secondary | ICD-10-CM | POA: Diagnosis present

## 2013-07-27 DIAGNOSIS — B964 Proteus (mirabilis) (morganii) as the cause of diseases classified elsewhere: Secondary | ICD-10-CM

## 2013-07-27 DIAGNOSIS — E7800 Pure hypercholesterolemia, unspecified: Secondary | ICD-10-CM | POA: Diagnosis present

## 2013-07-27 DIAGNOSIS — E785 Hyperlipidemia, unspecified: Secondary | ICD-10-CM | POA: Diagnosis present

## 2013-07-27 DIAGNOSIS — Z9119 Patient's noncompliance with other medical treatment and regimen: Secondary | ICD-10-CM

## 2013-07-27 DIAGNOSIS — F29 Unspecified psychosis not due to a substance or known physiological condition: Secondary | ICD-10-CM | POA: Diagnosis present

## 2013-07-27 DIAGNOSIS — Z91199 Patient's noncompliance with other medical treatment and regimen due to unspecified reason: Secondary | ICD-10-CM

## 2013-07-27 DIAGNOSIS — R471 Dysarthria and anarthria: Secondary | ICD-10-CM | POA: Diagnosis present

## 2013-07-27 DIAGNOSIS — I619 Nontraumatic intracerebral hemorrhage, unspecified: Principal | ICD-10-CM | POA: Diagnosis present

## 2013-07-27 DIAGNOSIS — E039 Hypothyroidism, unspecified: Secondary | ICD-10-CM | POA: Diagnosis present

## 2013-07-27 DIAGNOSIS — I639 Cerebral infarction, unspecified: Secondary | ICD-10-CM | POA: Diagnosis present

## 2013-07-27 DIAGNOSIS — F3289 Other specified depressive episodes: Secondary | ICD-10-CM | POA: Diagnosis present

## 2013-07-27 DIAGNOSIS — G8190 Hemiplegia, unspecified affecting unspecified side: Secondary | ICD-10-CM | POA: Diagnosis present

## 2013-07-27 HISTORY — DX: Obstructive hydrocephalus: G91.1

## 2013-07-27 LAB — COMPREHENSIVE METABOLIC PANEL
ALT: 15 U/L (ref 0–35)
AST: 16 U/L (ref 0–37)
Albumin: 3.7 g/dL (ref 3.5–5.2)
Alkaline Phosphatase: 70 U/L (ref 39–117)
BUN: 14 mg/dL (ref 6–23)
CO2: 25 mEq/L (ref 19–32)
Calcium: 10.5 mg/dL (ref 8.4–10.5)
Chloride: 104 mEq/L (ref 96–112)
Creatinine, Ser: 0.75 mg/dL (ref 0.50–1.10)
GFR calc Af Amer: >90 mL/min (ref >90)
GFR calc non Af Amer: >90 mL/min (ref >90)
Glucose, Bld: 102 mg/dL — ABNORMAL HIGH (ref 70–99)
Potassium: 4.7 mEq/L (ref 3.7–5.3)
Sodium: 140 mEq/L (ref 137–147)
Total Bilirubin: <0.2 mg/dL — ABNORMAL LOW (ref 0.3–1.2)
Total Protein: 7.8 g/dL (ref 6.0–8.3)

## 2013-07-27 LAB — CBC
HCT: 38.5 % (ref 36.0–46.0)
Hemoglobin: 12.3 g/dL (ref 12.0–15.0)
MCH: 27.5 pg (ref 26.0–34.0)
MCHC: 31.9 g/dL (ref 30.0–36.0)
MCV: 85.9 fL (ref 78.0–100.0)
Platelets: 244 10*3/uL (ref 150–400)
RBC: 4.48 MIL/uL (ref 3.87–5.11)
RDW: 14.9 % (ref 11.5–15.5)
WBC: 6.6 10*3/uL (ref 4.0–10.5)

## 2013-07-27 LAB — URINALYSIS, ROUTINE W REFLEX MICROSCOPIC
Bilirubin Urine: NEGATIVE
Glucose, UA: NEGATIVE mg/dL
Hgb urine dipstick: NEGATIVE
Ketones, ur: NEGATIVE mg/dL
Leukocytes, UA: NEGATIVE
Nitrite: NEGATIVE
Protein, ur: NEGATIVE mg/dL
Specific Gravity, Urine: 1.006 (ref 1.005–1.030)
Urobilinogen, UA: 0.2 mg/dL (ref 0.0–1.0)
pH: 7.5 (ref 5.0–8.0)

## 2013-07-27 LAB — POCT I-STAT, CHEM 8
BUN: 14 mg/dL (ref 6–23)
Calcium, Ion: 1.45 mmol/L — ABNORMAL HIGH (ref 1.12–1.23)
Chloride: 105 mEq/L (ref 96–112)
Creatinine, Ser: 0.8 mg/dL (ref 0.50–1.10)
Glucose, Bld: 118 mg/dL — ABNORMAL HIGH (ref 70–99)
HCT: 44 % (ref 36.0–46.0)
Hemoglobin: 15 g/dL (ref 12.0–15.0)
Potassium: 4.6 mEq/L (ref 3.7–5.3)
Sodium: 139 mEq/L (ref 137–147)
TCO2: 25 mmol/L (ref 0–100)

## 2013-07-27 LAB — GLUCOSE, CAPILLARY: Glucose-Capillary: 106 mg/dL — ABNORMAL HIGH (ref 70–99)

## 2013-07-27 LAB — DIFFERENTIAL
Basophils Absolute: 0 10*3/uL (ref 0.0–0.1)
Basophils Relative: 0 % (ref 0–1)
Eosinophils Absolute: 0.1 10*3/uL (ref 0.0–0.7)
Eosinophils Relative: 2 % (ref 0–5)
Lymphocytes Relative: 25 % (ref 12–46)
Lymphs Abs: 1.7 10*3/uL (ref 0.7–4.0)
Monocytes Absolute: 0.4 10*3/uL (ref 0.1–1.0)
Monocytes Relative: 6 % (ref 3–12)
Neutro Abs: 4.5 10*3/uL (ref 1.7–7.7)
Neutrophils Relative %: 67 % (ref 43–77)

## 2013-07-27 LAB — TROPONIN I: Troponin I: <0.3 ng/mL (ref <0.30)

## 2013-07-27 LAB — MRSA PCR SCREENING: MRSA by PCR: NEGATIVE

## 2013-07-27 LAB — RAPID URINE DRUG SCREEN, HOSP PERFORMED
Amphetamines: NOT DETECTED
Barbiturates: NOT DETECTED
Benzodiazepines: NOT DETECTED
Cocaine: NOT DETECTED
Opiates: NOT DETECTED
Tetrahydrocannabinol: NOT DETECTED

## 2013-07-27 LAB — PROTIME-INR
INR: 0.92 (ref 0.00–1.49)
Prothrombin Time: 12.2 seconds (ref 11.6–15.2)

## 2013-07-27 LAB — ETHANOL: Alcohol, Ethyl (B): <11 mg/dL (ref 0–11)

## 2013-07-27 LAB — POCT I-STAT TROPONIN I: Troponin i, poc: 0.01 ng/mL (ref 0.00–0.08)

## 2013-07-27 LAB — APTT: aPTT: 28 seconds (ref 24–37)

## 2013-07-27 IMAGING — CT CT HEAD W/O CM
1 series · 16 of 30 positions shown, 20 images · non-contrast
Comparison: Prior scan same day

CLINICAL DATA: Worsening mental status left side weakness

EXAM:
CT HEAD WITHOUT CONTRAST
TECHNIQUE: Contiguous axial images were obtained from the base of the skull
through the vertex without intravenous contrast.

[Series 2: head 5.0 h30s · axial · 0.43mm/px · z∈[-91,+54]mm · 16 of 33 slices shown, 20 images]
[im 2/33  brain]
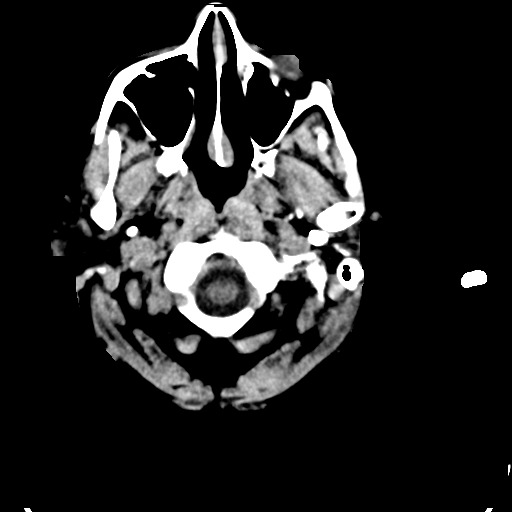
[im 2/33  bone]
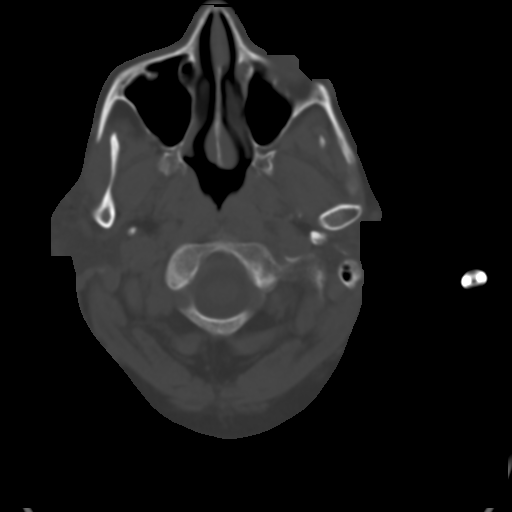
[im 4/33  brain]
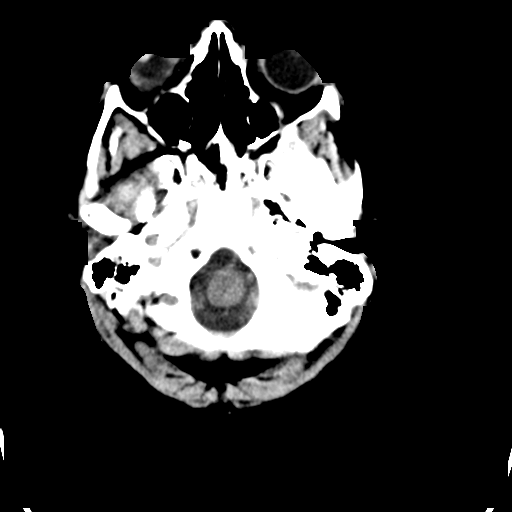
[im 6/33  brain]
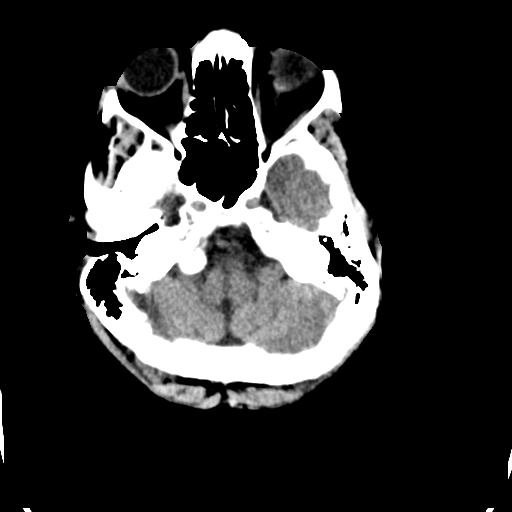
[im 8/33  brain]
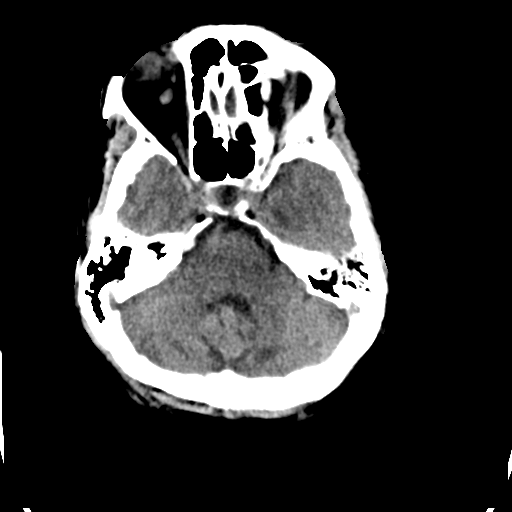
[im 9/33  brain]
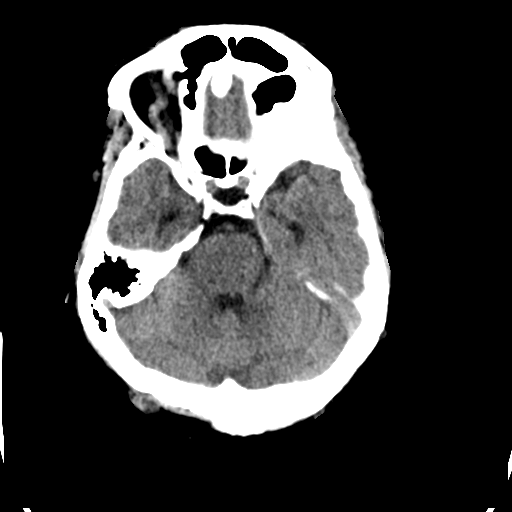
[im 9/33  bone]
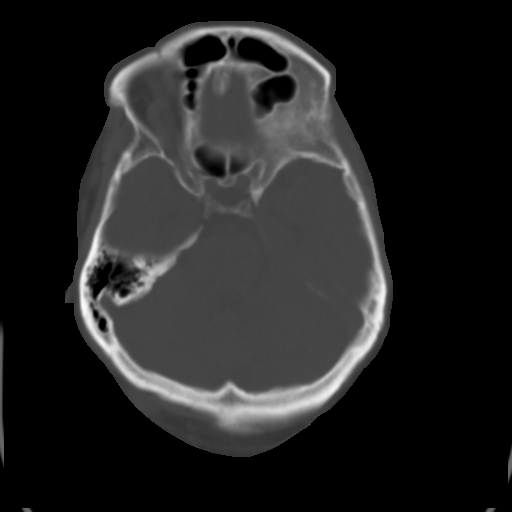
[im 12/33  brain]
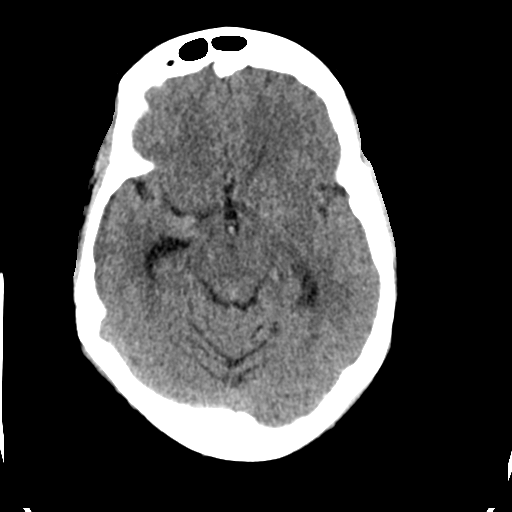
[im 14/33  brain]
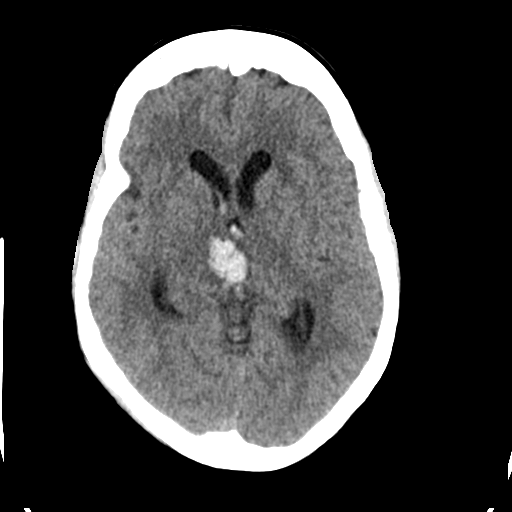
[im 16/33  brain]
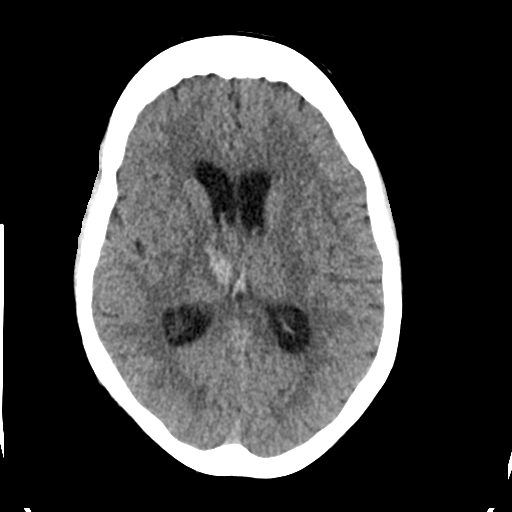
[im 17/33  brain]
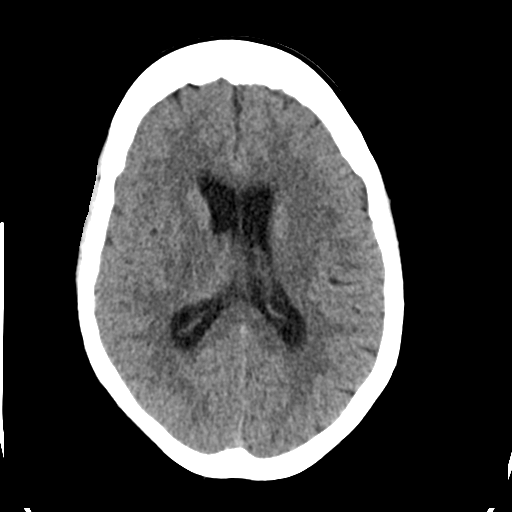
[im 17/33  bone]
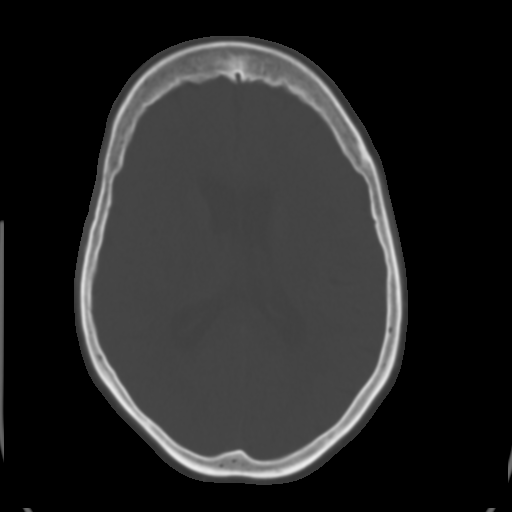
[im 19/33  brain]
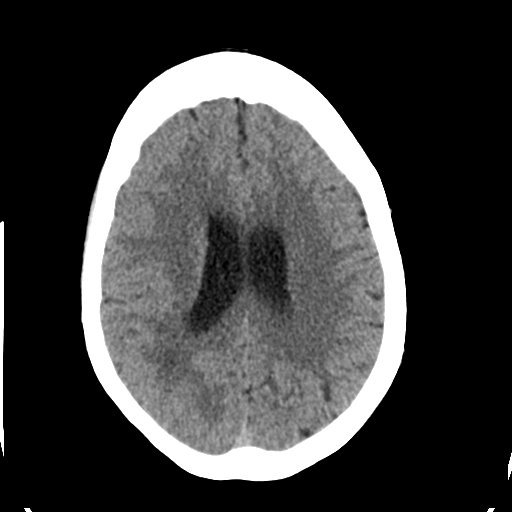
[im 21/33  brain]
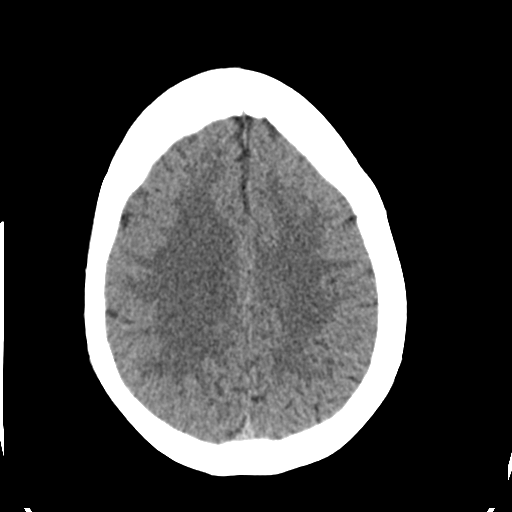
[im 24/33  brain]
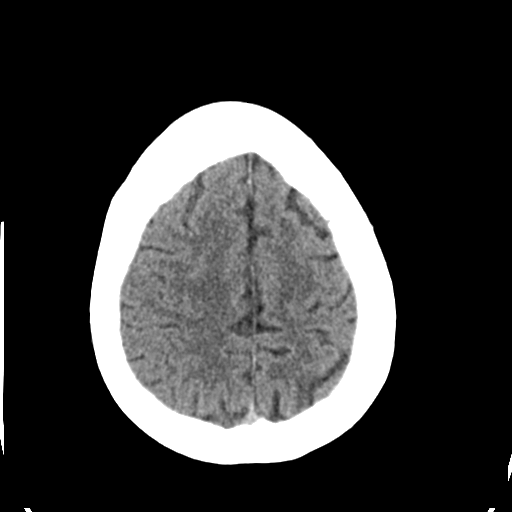
[im 25/33  brain]
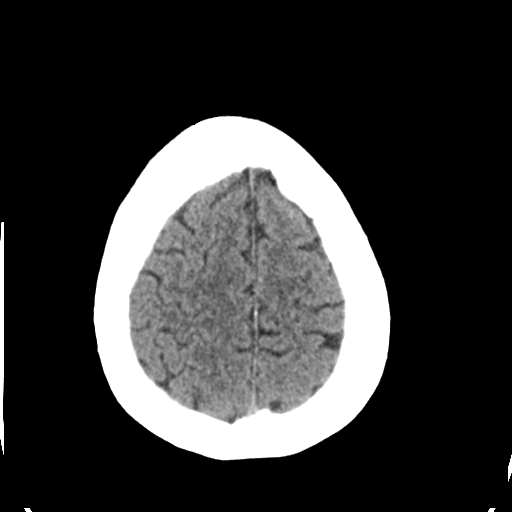
[im 25/33  bone]
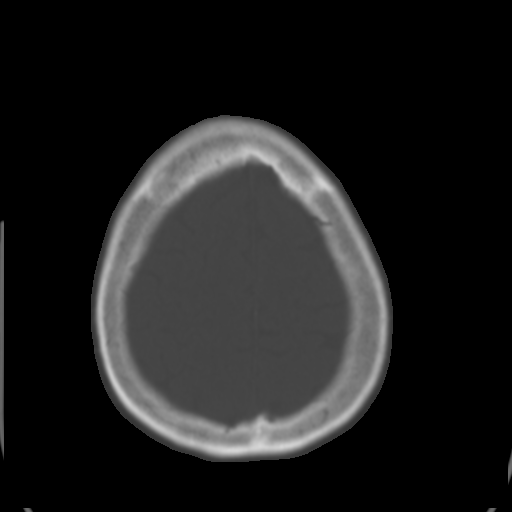
[im 27/33  brain]
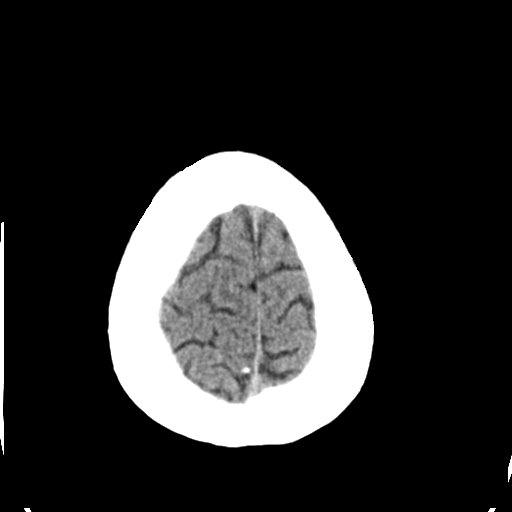
[im 29/33  brain]
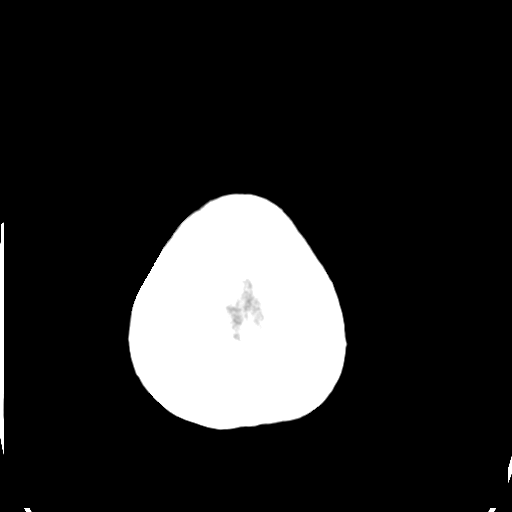
[im 31/33  brain]
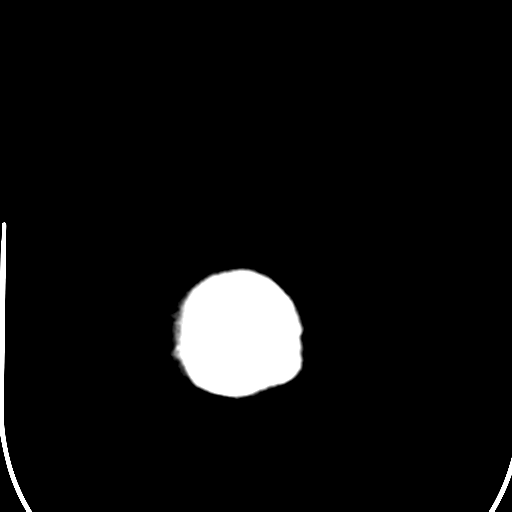

[16 of 30 positions shown; findings below may reference images not displayed]

FINDINGS: Again noted right thalamic hemorrhage with extension into the third
ventricle and some blood in anterior horn of the right lateral
ventricle. Again noted blood in duct of Sylvius to the top of fourth
ventricle. Mild increase in right to left mass effect axial image 14
measures about 5 mm. Again noted the obstructive hydrocephalus with
minimal progression. No new focus of hemorrhage is noted.
IMPRESSION: Again noted right thalamic hemorrhage with extension into the third
ventricle and some blood in anterior horn of the right lateral
ventricle. Again noted blood in duct of Sylvius to the top of fourth
ventricle. Mild increase in right to left mass effect axial image 14
measures about 5 mm. Again noted the obstructive hydrocephalus with
minimal progression. No new focus of hemorrhage is noted.

## 2013-07-27 MED ORDER — FENTANYL CITRATE 0.05 MG/ML IJ SOLN
100.0000 ug | Freq: Once | INTRAMUSCULAR | Status: AC
  Administered 2013-07-27: 100 ug via INTRAVENOUS

## 2013-07-27 MED ORDER — ACETAMINOPHEN 650 MG RE SUPP
650.0000 mg | RECTAL | Status: DC | PRN
  Filled 2013-07-27 (×2): qty 1

## 2013-07-27 MED ORDER — LABETALOL HCL 5 MG/ML IV SOLN
10.0000 mg | INTRAVENOUS | Status: DC | PRN
  Administered 2013-07-27 – 2013-07-28 (×2): 20 mg via INTRAVENOUS
  Administered 2013-07-28: 15 mg via INTRAVENOUS
  Administered 2013-07-31: 20 mg via INTRAVENOUS
  Filled 2013-07-27 (×4): qty 4

## 2013-07-27 MED ORDER — SODIUM CHLORIDE 0.9 % IV SOLN: INTRAVENOUS | Status: DC

## 2013-07-27 MED ORDER — INFLUENZA VAC SPLIT QUAD 0.5 ML IM SUSP
0.5000 mL | INTRAMUSCULAR | Status: DC
  Filled 2013-07-27: qty 0.5

## 2013-07-27 MED ORDER — BIOTENE DRY MOUTH MT LIQD
15.0000 mL | Freq: Two times a day (BID) | OROMUCOSAL | Status: DC
  Administered 2013-07-27 – 2013-08-02 (×11): 15 mL via OROMUCOSAL

## 2013-07-27 MED ORDER — SODIUM CHLORIDE 0.9 % IV SOLN
INTRAVENOUS | Status: DC
  Administered 2013-07-27 – 2013-08-01 (×8): via INTRAVENOUS

## 2013-07-27 MED ORDER — FENTANYL CITRATE 0.05 MG/ML IJ SOLN
INTRAMUSCULAR | Status: AC
  Administered 2013-07-27: 100 ug via INTRAVENOUS
  Filled 2013-07-27: qty 2

## 2013-07-27 MED ORDER — SENNOSIDES-DOCUSATE SODIUM 8.6-50 MG PO TABS
1.0000 | ORAL_TABLET | Freq: Two times a day (BID) | ORAL | Status: DC
  Administered 2013-07-27 – 2013-08-04 (×15): 1 via ORAL
  Filled 2013-07-27 (×18): qty 1

## 2013-07-27 MED ORDER — ACETAMINOPHEN 325 MG PO TABS
650.0000 mg | ORAL_TABLET | ORAL | Status: DC | PRN
  Administered 2013-07-27 – 2013-08-04 (×10): 650 mg via ORAL
  Filled 2013-07-27 (×13): qty 2

## 2013-07-27 MED ORDER — PANTOPRAZOLE SODIUM 40 MG IV SOLR
40.0000 mg | Freq: Every day | INTRAVENOUS | Status: DC
  Administered 2013-07-27 – 2013-07-28 (×2): 40 mg via INTRAVENOUS
  Filled 2013-07-27 (×3): qty 40

## 2013-07-27 MED ORDER — HYDRALAZINE HCL 20 MG/ML IJ SOLN
INTRAMUSCULAR | Status: AC
  Filled 2013-07-27: qty 1

## 2013-07-27 NOTE — Progress Notes (Signed)
UR completed.  Ame Heagle, RN BSN M[facility] CCM Trauma/Neuro ICU Case Manager [phone]  

## 2013-07-27 NOTE — ED Provider Notes (Signed)
Angiocath insertion Performed by: Osvaldo Shipper  Consent: Verbal consent obtained. Risks and benefits: risks, benefits and alternatives were discussed Time out: Immediately prior to procedure a "time out" was called to verify the correct patient, procedure, equipment, support staff and site/side marked as required.  Preparation: Patient was prepped and draped in the usual sterile fashion.  Vein Location: R AC  Yes Ultrasound Guided  Gauge: 20  Normal blood return and flush without difficulty Patient tolerance: Patient tolerated the procedure well with no immediate complications.     Osvaldo Shipper, MD 07/27/13 [id number]

## 2013-07-27 NOTE — ED Notes (Signed)
To CT on way to 3100. Resp therapy notifies to standby. Pt has snoring respirations, lethargic, arousable with sternal rubs. When moved onto CT table---pt talking, alert/oriented x3

## 2013-07-27 NOTE — ED Notes (Signed)
Apresoline 10mg  given IV

## 2013-07-27 NOTE — Progress Notes (Signed)
Op note [id number]. RIGHT IVC to the frontal ventricle

## 2013-07-27 NOTE — H&P (Signed)
Admission H&P    Chief Complaint: New-onset headache and side weakness and numbness.  HPI: Morgan Collins is an 58 y.o. female history hypertension, hyperlipidemia and hypothyroidism and poor compliance with taking prescribed medications presenting with complaint of headache this morning and sudden onset of weakness and numbness involving left side at 10 AM. Is no previous history of stroke nor TIA. CT scan of her head showed right thalamic hemorrhage with extension into lateral and third ventricles with mass effect on the third ventricle and signs of early ventricle enlargement. NIH stroke score was 11. Patient became increasingly more lethargic in the emergency room. Repeat CT of her head showed a slight increase in size of her lateral ventricles indicative of early hydrocephalus. There was no significant increase in size of patient's thalamic hemorrhage.  LSN: 10 AM on 07/27/2013 tPA Given: No: Acute thalamic hemorrhage MRankin: 3  Past Medical History  Diagnosis Date  . Hypertension   . Thyroid disease   . High cholesterol   . Depressed     No past surgical history on file.  No family history on file. Social History:  reports that she has never smoked. She does not have any smokeless tobacco history on file. She reports that she does not drink alcohol. Her drug history is not on file.  Allergies: No Known Allergies  Medications: Medications prior to admission reviewed.  ROS: History obtained from child and the patient  General ROS: negative for - chills, fatigue, fever, night sweats, weight gain or weight loss Psychological ROS: negative for - behavioral disorder, hallucinations, memory difficulties, mood swings or suicidal ideation Ophthalmic ROS: negative for - blurry vision, double vision, eye pain or loss of vision ENT ROS: negative for - epistaxis, nasal discharge, oral lesions, sore throat, tinnitus or vertigo Allergy and Immunology ROS: negative for - hives or  itchy/watery eyes Hematological and Lymphatic ROS: negative for - bleeding problems, bruising or swollen lymph nodes Endocrine ROS: negative for - galactorrhea, hair pattern changes, polydipsia/polyuria or temperature intolerance Respiratory ROS: negative for - cough, hemoptysis, shortness of breath or wheezing Cardiovascular ROS: negative for - chest pain, dyspnea on exertion, edema or irregular heartbeat Gastrointestinal ROS: negative for - abdominal pain, diarrhea, hematemesis, nausea/vomiting or stool incontinence Genito-Urinary ROS: negative for - dysuria, hematuria, incontinence or urinary frequency/urgency Musculoskeletal ROS: negative for - joint swelling or muscular weakness Neurological ROS: as noted in HPI Dermatological ROS: negative for rash and skin lesion changes  Physical Examination: There were no vitals taken for this visit.  HEENT-  Normocephalic, no lesions, without obvious abnormality.  Normal external eye and conjunctiva.  Normal TM's bilaterally.  Normal auditory canals and external ears. Normal external nose, mucus membranes and septum.  Normal pharynx. Neck supple with no masses, nodes, nodules or enlargement. Cardiovascular - regular rate and rhythm, S1, S2 normal, no murmur, click, rub or gallop Lungs - chest clear, no wheezing, rales, normal symmetric air entry, Heart exam - S1, S2 normal, no murmur, no gallop, rate regular Abdomen - soft, non-tender; bowel sounds normal; no masses,  no organomegaly Extremities - no joint deformities, effusion, or inflammation, no edema and no skin discoloration  Neurologic Examination: Mental Status: Lethargic, oriented, slightly confused.  Speech slightly dysarthric without evidence of aphasia. Able to follow commands without right extremities than left extremities. Cranial Nerves: II-Visual fields were normal. III/IV/VI-Pupils were equal and reacted. Extraocular movements were full and conjugate.    V/VII-no facial numbness  and no facial weakness. VIII-normal. X-mild dysarthria. Motor:  Moderate weakness of left upper extremity proximally and distally and mild weakness of left lower extremity proximally. Sensory: Reduced perception of tactile sensation over left extremities her extremities. Deep Tendon Reflexes: 2+ and symmetric. Plantars: Extensor on the left and flexor on the right Cerebellar: Unable to adequately assess.  Results for orders placed during the hospital encounter of 07/27/13 (from the past 48 hour(s))  PROTIME-INR     Status: None   Collection Time    07/27/13 11:21 AM      Result Value Range   Prothrombin Time 12.2  11.6 - 15.2 seconds   INR 0.92  0.00 - 1.49  APTT     Status: None   Collection Time    07/27/13 11:21 AM      Result Value Range   aPTT 28  24 - 37 seconds  CBC     Status: None   Collection Time    07/27/13 11:21 AM      Result Value Range   WBC 6.6  4.0 - 10.5 K/uL   RBC 4.48  3.87 - 5.11 MIL/uL   Hemoglobin 12.3  12.0 - 15.0 g/dL   HCT 09.3  81.8 - 29.9 %   MCV 85.9  78.0 - 100.0 fL   MCH 27.5  26.0 - 34.0 pg   MCHC 31.9  30.0 - 36.0 g/dL   RDW 37.1  69.6 - 78.9 %   Platelets 244  150 - 400 K/uL  DIFFERENTIAL     Status: None   Collection Time    07/27/13 11:21 AM      Result Value Range   Neutrophils Relative % 67  43 - 77 %   Neutro Abs 4.5  1.7 - 7.7 K/uL   Lymphocytes Relative 25  12 - 46 %   Lymphs Abs 1.7  0.7 - 4.0 K/uL   Monocytes Relative 6  3 - 12 %   Monocytes Absolute 0.4  0.1 - 1.0 K/uL   Eosinophils Relative 2  0 - 5 %   Eosinophils Absolute 0.1  0.0 - 0.7 K/uL   Basophils Relative 0  0 - 1 %   Basophils Absolute 0.0  0.0 - 0.1 K/uL  POCT I-STAT TROPONIN I     Status: None   Collection Time    07/27/13 11:39 AM      Result Value Range   Troponin i, poc 0.01  0.00 - 0.08 ng/mL   Comment 3            Comment: Due to the release kinetics of cTnI,     a negative result within the first hours     of the onset of symptoms does not rule out      myocardial infarction with certainty.     If myocardial infarction is still suspected,     repeat the test at appropriate intervals.  POCT I-STAT, CHEM 8     Status: Abnormal   Collection Time    07/27/13 11:41 AM      Result Value Range   Sodium 139  137 - 147 mEq/L   Potassium 4.6  3.7 - 5.3 mEq/L   Chloride 105  96 - 112 mEq/L   BUN 14  6 - 23 mg/dL   Creatinine, Ser 3.81  0.50 - 1.10 mg/dL   Glucose, Bld 017 (*) 70 - 99 mg/dL   Calcium, Ion 5.10 (*) 1.12 - 1.23 mmol/L   TCO2 25  0 - 100 mmol/L  Hemoglobin 15.0  12.0 - 15.0 g/dL   HCT 44.0  36.0 - 46.0 %   Ct Head Wo Contrast  07/27/2013   CLINICAL DATA:  Acute onset of left-sided weakness and slurred speech. Code stroke.  EXAM: CT HEAD WITHOUT CONTRAST  TECHNIQUE: Contiguous axial images were obtained from the base of the skull through the vertex without intravenous contrast.  COMPARISON:  None.  FINDINGS: There is an acute intracerebral hemorrhage in the right thalamus measuring 24 x 18 mm. The hemorrhage has ruptured into the third ventricle with some blood in the anterior horn of the right lateral ventricle with blood extending into the aqueduct of Sylvius to the top of the fourth ventricle.  The lateral ventricles appear slightly prominent including the temporal horns consistent with early hydrocephalus.  There is a faint area of white matter low density in the right posterior parietal lobe consistent with chronic small vessel ischemic disease. Osseous structures are normal.  IMPRESSION: 1. Acute hemorrhage in the right thalamus with extension into the third and right lateral ventricles and into the aqueduct of Sylvius. 2. Dilatation of the lateral ventricles consistent with obstructive hydrocephalus. Critical Value/emergent results were called by telephone at the time of interpretation on 07/27/2013 at 11:43 AM to Dr. Nicole Kindred, who verbally acknowledged these results.   Electronically Signed   By: Rozetta Nunnery M.D.   On: 07/27/2013  11:43    Assessment: 58 y.o. female with hypertension and poor compliance with treatment presenting with acute right thalamic hemorrhage with extension into lateral and third ventricles and early signs of obstructive hydrocephalus.  Stroke Risk Factors - family history, hyperlipidemia and hypertension  Plan: 1. Neurosurgical consultation for consideration for placement of external ventricular drain 2. MRI, MRA  of the brain without contrast 3. PT consult, OT consult, Speech consult 4. Echocardiogram 5. Carotid dopplers 6. Prophylactic therapy-None 7. Risk factor modification 8. HgbA1c, fasting lipid panel  Patient's admission evaluation and management required complex decision-making as well as repeat diagnostic studies because of worsening clinically, and neurosurgery consultation and family counseling, in addition to admission to intensive care unit. Total critical care time involved was 90 minutes.  C.R. Nicole Kindred, MD [facility] [phone]  07/27/2013, 12:03 PM

## 2013-07-27 NOTE — ED Notes (Signed)
Family at bedside-- snoring respirations will arouse with sternal rub

## 2013-07-27 NOTE — Consult Note (Signed)
Reason for Consult:ICH Referring Physician: JAZZMEN RESTIVO is an 58 y.o. female.  HPI: patient brought to the er after sudden onset of headache.ct showed ICH at the level of the right thalamus associated with early hydrocephalus  Past Medical History  Diagnosis Date  . Hypertension   . Thyroid disease   . High cholesterol   . Depressed     History reviewed. No pertinent past surgical history.  No family history on file.  Social History:  reports that she has never smoked. She does not have any smokeless tobacco history on file. She reports that she does not drink alcohol. Her drug history is not on file.  Allergies: No Known Allergies  Medications: see HP  Results for orders placed during the hospital encounter of 07/27/13 (from the past 48 hour(s))  PROTIME-INR     Status: None   Collection Time    07/27/13 11:21 AM      Result Value Range   Prothrombin Time 12.2  11.6 - 15.2 seconds   INR 0.92  0.00 - 1.49  APTT     Status: None   Collection Time    07/27/13 11:21 AM      Result Value Range   aPTT 28  24 - 37 seconds  CBC     Status: None   Collection Time    07/27/13 11:21 AM      Result Value Range   WBC 6.6  4.0 - 10.5 K/uL   RBC 4.48  3.87 - 5.11 MIL/uL   Hemoglobin 12.3  12.0 - 15.0 g/dL   HCT 38.5  36.0 - 46.0 %   MCV 85.9  78.0 - 100.0 fL   MCH 27.5  26.0 - 34.0 pg   MCHC 31.9  30.0 - 36.0 g/dL   RDW 14.9  11.5 - 15.5 %   Platelets 244  150 - 400 K/uL  DIFFERENTIAL     Status: None   Collection Time    07/27/13 11:21 AM      Result Value Range   Neutrophils Relative % 67  43 - 77 %   Neutro Abs 4.5  1.7 - 7.7 K/uL   Lymphocytes Relative 25  12 - 46 %   Lymphs Abs 1.7  0.7 - 4.0 K/uL   Monocytes Relative 6  3 - 12 %   Monocytes Absolute 0.4  0.1 - 1.0 K/uL   Eosinophils Relative 2  0 - 5 %   Eosinophils Absolute 0.1  0.0 - 0.7 K/uL   Basophils Relative 0  0 - 1 %   Basophils Absolute 0.0  0.0 - 0.1 K/uL  POCT I-STAT TROPONIN I      Status: None   Collection Time    07/27/13 11:39 AM      Result Value Range   Troponin i, poc 0.01  0.00 - 0.08 ng/mL   Comment 3            Comment: Due to the release kinetics of cTnI,     a negative result within the first hours     of the onset of symptoms does not rule out     myocardial infarction with certainty.     If myocardial infarction is still suspected,     repeat the test at appropriate intervals.  POCT I-STAT, CHEM 8     Status: Abnormal   Collection Time    07/27/13 11:41 AM      Result Value Range  Sodium 139  137 - 147 mEq/L   Potassium 4.6  3.7 - 5.3 mEq/L   Chloride 105  96 - 112 mEq/L   BUN 14  6 - 23 mg/dL   Creatinine, Ser 0.80  0.50 - 1.10 mg/dL   Glucose, Bld 118 (*) 70 - 99 mg/dL   Calcium, Ion 1.45 (*) 1.12 - 1.23 mmol/L   TCO2 25  0 - 100 mmol/L   Hemoglobin 15.0  12.0 - 15.0 g/dL   HCT 44.0  36.0 - 46.0 %  ETHANOL     Status: None   Collection Time    07/27/13 12:00 PM      Result Value Range   Alcohol, Ethyl (B) <11  0 - 11 mg/dL   Comment:            LOWEST DETECTABLE LIMIT FOR     SERUM ALCOHOL IS 11 mg/dL     FOR MEDICAL PURPOSES ONLY  COMPREHENSIVE METABOLIC PANEL     Status: Abnormal   Collection Time    07/27/13 12:00 PM      Result Value Range   Sodium 140  137 - 147 mEq/L   Potassium 4.7  3.7 - 5.3 mEq/L   Chloride 104  96 - 112 mEq/L   CO2 25  19 - 32 mEq/L   Glucose, Bld 102 (*) 70 - 99 mg/dL   BUN 14  6 - 23 mg/dL   Creatinine, Ser 0.75  0.50 - 1.10 mg/dL   Calcium 10.5  8.4 - 10.5 mg/dL   Total Protein 7.8  6.0 - 8.3 g/dL   Albumin 3.7  3.5 - 5.2 g/dL   AST 16  0 - 37 U/L   ALT 15  0 - 35 U/L   Alkaline Phosphatase 70  39 - 117 U/L   Total Bilirubin <0.2 (*) 0.3 - 1.2 mg/dL   GFR calc non Af Amer >90  >90 mL/min   GFR calc Af Amer >90  >90 mL/min   Comment: (NOTE)     The eGFR has been calculated using the CKD EPI equation.     This calculation has not been validated in all clinical situations.     eGFR's  persistently <90 mL/min signify possible Chronic Kidney     Disease.  TROPONIN I     Status: None   Collection Time    07/27/13 12:00 PM      Result Value Range   Troponin I <0.30  <0.30 ng/mL   Comment:            Due to the release kinetics of cTnI,     a negative result within the first hours     of the onset of symptoms does not rule out     myocardial infarction with certainty.     If myocardial infarction is still suspected,     repeat the test at appropriate intervals.  URINALYSIS, ROUTINE W REFLEX MICROSCOPIC     Status: None   Collection Time    07/27/13  1:33 PM      Result Value Range   Color, Urine YELLOW  YELLOW   APPearance CLEAR  CLEAR   Specific Gravity, Urine 1.006  1.005 - 1.030   pH 7.5  5.0 - 8.0   Glucose, UA NEGATIVE  NEGATIVE mg/dL   Hgb urine dipstick NEGATIVE  NEGATIVE   Bilirubin Urine NEGATIVE  NEGATIVE   Ketones, ur NEGATIVE  NEGATIVE mg/dL   Protein, ur NEGATIVE  NEGATIVE mg/dL   Urobilinogen, UA 0.2  0.0 - 1.0 mg/dL   Nitrite NEGATIVE  NEGATIVE   Leukocytes, UA NEGATIVE  NEGATIVE   Comment: MICROSCOPIC NOT DONE ON URINES WITH NEGATIVE PROTEIN, BLOOD, LEUKOCYTES, NITRITE, OR GLUCOSE <1000 mg/dL.    Ct Head Wo Contrast  07/27/2013   CLINICAL DATA:  Worsening mental status left side weakness  EXAM: CT HEAD WITHOUT CONTRAST  TECHNIQUE: Contiguous axial images were obtained from the base of the skull through the vertex without intravenous contrast.  COMPARISON:  Prior scan same day  FINDINGS: Again noted right thalamic hemorrhage with extension into the third ventricle and some blood in anterior horn of the right lateral ventricle. Again noted blood in duct of Sylvius to the top of fourth ventricle. Mild increase in right to left mass effect axial image 14 measures about 5 mm. Again noted the obstructive hydrocephalus with minimal progression. No new focus of hemorrhage is noted.  IMPRESSION: Again noted right thalamic hemorrhage with extension into the  third ventricle and some blood in anterior horn of the right lateral ventricle. Again noted blood in duct of Sylvius to the top of fourth ventricle. Mild increase in right to left mass effect axial image 14 measures about 5 mm. Again noted the obstructive hydrocephalus with minimal progression. No new focus of hemorrhage is noted.   Electronically Signed   By: Lahoma Crocker M.D.   On: 07/27/2013 12:50   Ct Head Wo Contrast  07/27/2013   CLINICAL DATA:  Acute onset of left-sided weakness and slurred speech. Code stroke.  EXAM: CT HEAD WITHOUT CONTRAST  TECHNIQUE: Contiguous axial images were obtained from the base of the skull through the vertex without intravenous contrast.  COMPARISON:  None.  FINDINGS: There is an acute intracerebral hemorrhage in the right thalamus measuring 24 x 18 mm. The hemorrhage has ruptured into the third ventricle with some blood in the anterior horn of the right lateral ventricle with blood extending into the aqueduct of Sylvius to the top of the fourth ventricle.  The lateral ventricles appear slightly prominent including the temporal horns consistent with early hydrocephalus.  There is a faint area of white matter low density in the right posterior parietal lobe consistent with chronic small vessel ischemic disease. Osseous structures are normal.  IMPRESSION: 1. Acute hemorrhage in the right thalamus with extension into the third and right lateral ventricles and into the aqueduct of Sylvius. 2. Dilatation of the lateral ventricles consistent with obstructive hydrocephalus. Critical Value/emergent results were called by telephone at the time of interpretation on 07/27/2013 at 11:43 AM to Dr. Nicole Kindred, who verbally acknowledged these results.   Electronically Signed   By: Rozetta Nunnery M.D.   On: 07/27/2013 11:43    Review of Systems  Constitutional: Negative.   Eyes: Negative.   Respiratory: Negative.   Cardiovascular:       Arterial hypertension  Gastrointestinal: Negative.    Musculoskeletal: Negative.   Skin: Negative.   Neurological: Positive for loss of consciousness. Negative for headaches.  Endo/Heme/Allergies: Negative.   Psychiatric/Behavioral: Negative.    Blood pressure 149/92, pulse 74, resp. rate 13, height '4\' 9"'  (1.448 m), weight 101.969 kg (224 lb 12.8 oz), SpO2 99.00%. Physical Exam HENT, NL. NECK, NL. CV, NL. LUGS CLEAR. ABDOMEN SOFT extremities, nl. NEURO obtunded, answer to name. No weakness. 2 ct head showed right thalamin ich with early hydrocephalus Assessment/Plan: i did speak with the family. Agree with insertion of IVC to help with the hydrocephalus  Landry Lookingbill M 07/27/2013, 2:21 PM

## 2013-07-27 NOTE — ED Notes (Signed)
TO ED via [facility] with c/o sudden onset left sided weakness and right sided severe headache. Last seen normal at10am. On arrival - responding to questions appropriately, sleepy acting. Able to move all extremities. Weaker left hand grasp-- EMS stated was able to walk to stretcher. To CT after ED clear at bridge.

## 2013-07-27 NOTE — ED Provider Notes (Signed)
CSN: [id number]     Arrival date & time 07/27/13  1118 History   First MD Initiated Contact with Patient 07/27/13 1120     No chief complaint on file.    HPI  Patient presents as a code stroke. Patient's lasting normal time was 1.5 hours prior to my evaluation.  I evaluated the patient initially on arrival to the emergency department to ascertain the patency of her airway, for initial neurologic exam. Patient herself cannot verbalize anything, this is a level V caveat. EMS reports the patient was last seen normal by coworkers, and found with altered mental status after complaining of headache on the right side. Per EMS the patient was hypertensive en route, otherwise hemodynamically stable. Coworkers reports the patient has been inconsistent with blood pressure medication.   Past Medical History  Diagnosis Date  . Hypertension   . Thyroid disease   . High cholesterol   . Depressed    No past surgical history on file. No family history on file. History  Substance Use Topics  . Smoking status: Never Smoker   . Smokeless tobacco: Not on file  . Alcohol Use: No   OB History   Grav Para Term Preterm Abortions TAB SAB Ect Mult Living                 Review of Systems  Unable to perform ROS: Mental status change      Allergies  Review of patient's allergies indicates no known allergies.  Home Medications   Current Outpatient Rx  Name  Route  Sig  Dispense  Refill  . albuterol (PROVENTIL HFA;VENTOLIN HFA) 108 (90 BASE) MCG/ACT inhaler   Inhalation   Inhale 1-2 puffs into the lungs every 6 (six) hours as needed for wheezing or shortness of breath.   1 Inhaler   0   . azithromycin (ZITHROMAX) 250 MG tablet   Oral   Take 1 tablet (250 mg total) by mouth daily. Take first 2 tablets together, then 1 every day until finished.   6 tablet   0   . benazepril (LOTENSIN) 20 MG tablet   Oral   Take 20 mg by mouth at bedtime.         Marland Kitchen guaiFENesin (MUCINEX) 600 MG 12 hr  tablet   Oral   Take by mouth 2 (two) times daily.         Marland Kitchen levothyroxine (SYNTHROID, LEVOTHROID) 50 MCG tablet   Oral   Take 1.5 tablets (75 mcg total) by mouth daily before breakfast.   45 tablet   1   . lisinopril-hydrochlorothiazide (PRINZIDE) 20-12.5 MG per tablet   Oral   Take 1 tablet by mouth daily.   30 tablet   0   . lisinopril-hydrochlorothiazide (ZESTORETIC) 20-12.5 MG per tablet   Oral   Take 1 tablet by mouth daily.   30 tablet   1    There were no vitals taken for this visit. Physical Exam  Constitutional: She appears distressed.  Obese middle-aged female listless  Eyes: Conjunctivae are normal. Right eye exhibits no discharge. Left eye exhibits no discharge.  Patient has disconjugate gaze, but does then recover capacity to track symmetrically, though with slowly. And no gross nystagmus, pupils are equal round and reactive.  Neck: No tracheal deviation present.  Cardiovascular: Normal rate and intact distal pulses.   Pulmonary/Chest: Effort normal. No stridor. No respiratory distress.  Abdominal: Soft. She exhibits no distension.  Musculoskeletal:  No gross musculoskeletal deformities  Neurological:  Patient is listless, with nearly flaccid left side 1/5 strength in the upper and lower extremity initially. Patient has inconsistent cooperation, but strength in the right upper extremities 5/5 initially. There is left facial droop, gross dysphasia  Skin: She is diaphoretic.  Psychiatric: She is slowed. Cognition and memory are impaired.    ED Course  Procedures (including critical care time) Labs Review Labs Reviewed  POCT I-STAT, CHEM 8 - Abnormal; Notable for the following:    Glucose, Bld 118 (*)    Calcium, Ion 1.45 (*)    All other components within normal limits  PROTIME-INR  APTT  CBC  DIFFERENTIAL  URINE RAPID DRUG SCREEN (HOSP PERFORMED)  URINALYSIS, ROUTINE W REFLEX MICROSCOPIC  ETHANOL  COMPREHENSIVE METABOLIC PANEL  TROPONIN I   POCT I-STAT TROPONIN I   Imaging Review Ct Head Wo Contrast  07/27/2013   CLINICAL DATA:  Acute onset of left-sided weakness and slurred speech. Code stroke.  EXAM: CT HEAD WITHOUT CONTRAST  TECHNIQUE: Contiguous axial images were obtained from the base of the skull through the vertex without intravenous contrast.  COMPARISON:  None.  FINDINGS: There is an acute intracerebral hemorrhage in the right thalamus measuring 24 x 18 mm. The hemorrhage has ruptured into the third ventricle with some blood in the anterior horn of the right lateral ventricle with blood extending into the aqueduct of Sylvius to the top of the fourth ventricle.  The lateral ventricles appear slightly prominent including the temporal horns consistent with early hydrocephalus.  There is a faint area of white matter low density in the right posterior parietal lobe consistent with chronic small vessel ischemic disease. Osseous structures are normal.  IMPRESSION: 1. Acute hemorrhage in the right thalamus with extension into the third and right lateral ventricles and into the aqueduct of Sylvius. 2. Dilatation of the lateral ventricles consistent with obstructive hydrocephalus. Critical Value/emergent results were called by telephone at the time of interpretation on 07/27/2013 at 11:43 AM to Dr. Nicole Kindred, who verbally acknowledged these results.   Electronically Signed   By: Rozetta Nunnery M.D.   On: 07/27/2013 11:43    Neuro exam at 11:20 on arrival - L sided weakness, Slow visual response, dysphasia  Update: On arrival to the exam room, the patient continues to have left-sided deficits, inability to describe these events.  Update: Thorough neurologic exam conducted with our neurology colleagues. Immediately discussed this case with our neurologist, including her presentation  Update: Head CT demonstrates thalamic bleed. On repeat exam the patient has persistently altered mental status. Initial vitals are notable for elevated blood  pressure. IV access was difficult, though it was obtained via ultrasound by a colleague.  NIH stroke scale 11     12:26 PM Patient w progressive decline in MS.  Repeat CT ordered.  MDM   Final diagnoses:  Stroke    Patient presents with gross neurologic deficits 1.5 hours after last being seen normal, and after complaining of headache.  Patient presentation is concerning for bleeding initially, and this is demonstrated on CT scan.  No intracranial hemorrhage, the competition in mental status, the patient was admitted to the ICU after discussing her care with our neurology team, obtaining IV access in the emergency department, and initial stabilization of BP.  With decreased interactivity the patient went from the ED, to CT scan in route to ICU and neurosurgical intervention. , The patient's CT scan at this point showed progression of her intracranial hemorrhage.    CRITICAL CARE Performed by: Vanita Panda,  Ermel Verne Total critical care time:40 Critical care time was exclusive of separately billable procedures and treating other patients. Critical care was necessary to treat or prevent imminent or life-threatening deterioration. Critical care was time spent personally by me on the following activities: development of treatment plan with patient and/or surrogate as well as nursing, discussions with consultants, evaluation of patient's response to treatment, examination of patient, obtaining history from patient or surrogate, ordering and performing treatments and interventions, ordering and review of laboratory studies, ordering and review of radiographic studies, pulse oximetry and re-evaluation of patient's condition.     Carmin Muskrat, MD 07/27/13 [phone]

## 2013-07-27 NOTE — Code Documentation (Signed)
58yo female arriving to [facility] via [place].  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  She was witnessed by coworkers to develop sudden onset left sided weakness.  When EMS arrived she was leaning to the left in the chair and hypertensive.  Code stroke called.  Patient has a history of hypertension and is noncompliant with taking her medications.  Patient taken to CT on arrival, CT showing ICH.  NIHSS 11 on arrival, see documentation for details.  Family at bedside and updated on plan of care.  Patient becoming increasingly lethargic in the ED, Dr. Nicole Kindred aware, Neurosurgery consulted.  Report given to [place], [place] RN by Santiago Glad, ED RN.  Patient taken for repeat CT scan, Dr. Nicole Kindred in CT to read scan.  Hydralazine IVP given in CT.  Patient transported to 3M11 and bedside handoff given to [place], [place].

## 2013-07-27 NOTE — ED Notes (Signed)
To 3100 

## 2013-07-28 DIAGNOSIS — I6350 Cerebral infarction due to unspecified occlusion or stenosis of unspecified cerebral artery: Secondary | ICD-10-CM

## 2013-07-28 MED ORDER — ONDANSETRON HCL 4 MG/2ML IJ SOLN
4.0000 mg | Freq: Four times a day (QID) | INTRAMUSCULAR | Status: DC | PRN
  Administered 2013-07-30 – 2013-07-31 (×2): 4 mg via INTRAVENOUS
  Filled 2013-07-28 (×2): qty 2

## 2013-07-28 MED ORDER — TRAMADOL HCL 50 MG PO TABS
100.0000 mg | ORAL_TABLET | Freq: Four times a day (QID) | ORAL | Status: DC | PRN
  Administered 2013-07-28 – 2013-08-04 (×12): 100 mg via ORAL
  Filled 2013-07-28 (×12): qty 2

## 2013-07-28 NOTE — Evaluation (Signed)
Speech Language Pathology Evaluation Patient Details Name: Morgan Collins MRN: [id number] DOB: Aug 08, 1955 Today's Date: 07/28/2013 Time: 6767-2094 SLP Time Calculation (min): 15 min  Problem List:  Patient Active Problem List   Diagnosis Date Noted  . Stroke 07/27/2013  . ICH (intracerebral hemorrhage) 07/27/2013  . Accelerated hypertension 07/27/2013  . Obstructive hydrocephalus 07/27/2013   Past Medical History:  Past Medical History  Diagnosis Date  . Hypertension   . Thyroid disease   . High cholesterol   . Depressed    Past Surgical History: History reviewed. No pertinent past surgical history. HPI:  58 y.o. female history hypertension, hyperlipidemia and hypothyroidism and poor compliance with taking prescribed medications presenting with complaint of headache this morning and sudden onset of weakness and numbness involving left side at 10 AM.  CT scan of her head showed right thalamic hemorrhage with extension into lateral and third ventricles with mass effect on the third ventricle and signs of early ventricle enlargement. NIH stroke score was 11. Patient became increasingly more lethargic in the emergency room. Repeat CT of her head showed a slight increase in size of her lateral ventricles indicative of early hydrocephalus.  On 2/11 pt. underwent insertion of a right frontal ventricular catheter for CSF.    Assessment / Plan / Recommendation Clinical Impression  Pt. exhibits moderate cognitive deficits in the areas of sustained attention, pragmatics, functional problem solving, awareness, reasoning, left inattention/neglect and working memory.  Pt.'s daughter states has an eccentric/headstrong personality at baseline and presently utilized humor frequently during interaction with SLP (trying to mask deficits?).  She would benefit from continued ST to facilitate cognitive independence on acute and inpatient rehab.    SLP Assessment  Patient needs continued Speech Lanaguage  Pathology Services    Follow Up Recommendations  Inpatient Rehab    Frequency and Duration min 2x/week  2 weeks   Pertinent Vitals/Pain WDL   SLP Goals  SLP Goals Potential to Achieve Goals: Good Potential Considerations: Cooperation/participation level  SLP Evaluation Prior Functioning  Cognitive/Linguistic Baseline: Within functional limits Vocation: Full time employment   Cognition  Overall Cognitive Status: Impaired/Different from baseline Arousal/Alertness: Awake/alert Orientation Level: Oriented to person;Oriented to place;Oriented to time;Oriented to situation Attention: Sustained Sustained Attention: Impaired Sustained Attention Impairment: Verbal basic Memory: Impaired Memory Impairment: Decreased recall of new information;Decreased short term memory;Retrieval deficit Decreased Short Term Memory: Verbal basic;Functional basic Awareness: Impaired Awareness Impairment: Intellectual impairment;Emergent impairment;Anticipatory impairment Problem Solving: Impaired Problem Solving Impairment: Verbal basic;Functional basic Executive Function: Self Monitoring;Self Correcting;Decision Making Behaviors: Poor frustration tolerance Safety/Judgment: Impaired    Comprehension  Auditory Comprehension Overall Auditory Comprehension: Appears within functional limits for tasks assessed Interfering Components: Attention;Processing speed Visual Recognition/Discrimination Discrimination: Not tested Reading Comprehension Reading Status:  (tba)    Expression Expression Primary Mode of Expression: Verbal Verbal Expression Overall Verbal Expression: Appears within functional limits for tasks assessed Initiation: No impairment Level of Generative/Spontaneous Verbalization: Conversation Repetition: No impairment Naming: No impairment Pragmatics: Impairment Impairments: Topic maintenance;Turn Taking Interfering Components: Attention Written Expression Written Expression:   (tba)   Oral / Motor Oral Motor/Sensory Function Overall Oral Motor/Sensory Function: Impaired Labial ROM: Reduced left Labial Symmetry: Abnormal symmetry left Labial Strength: Reduced Labial Sensation: Within Functional Limits Lingual ROM: Within Functional Limits Lingual Symmetry: Within Functional Limits Lingual Strength: Within Functional Limits Facial ROM: Within Functional Limits Facial Symmetry: Within Functional Limits Facial Strength: Within Functional Limits Mandible: Within Functional Limits Motor Speech Overall Motor Speech: Appears within functional limits for tasks assessed Respiration: Within  functional limits Phonation: Normal Resonance: Within functional limits Articulation: Within functional limitis Intelligibility: Intelligible Motor Planning: Witnin functional limits   GO     Orbie Pyo Tamaj Jurgens M.Ed [employer] [phone]  07/28/2013

## 2013-07-28 NOTE — Progress Notes (Addendum)
Pt reports intermittent blurriness in her eyes and pain behind her eyes that was present for weeks prior to the stroke but would resolve with rest. She mentioned that she meant to go to the doctor but was unable to. Pt reports that since the stroke, the pain & blurriness have been consistent. No change in neuro status noted. Will continue to monitor.   Latrelle Dodrill

## 2013-07-28 NOTE — Progress Notes (Signed)
Stroke Team Progress Note  HISTORY Morgan Collins is an 58 y.o. female history hypertension, hyperlipidemia and hypothyroidism and poor compliance with taking prescribed medications presenting with complaint of headache this morning and sudden onset of weakness and numbness involving left side at 10 AM 07/27/2013. She has no previous history of stroke nor TIA. CT scan of her head showed right thalamic hemorrhage with extension into lateral and third ventricles with mass effect on the third ventricle and signs of early ventricle enlargement. NIH stroke score was 11. Patient became increasingly more lethargic in the emergency room. Repeat CT of her head showed a slight increase in size of her lateral ventricles indicative of early hydrocephalus. There was no significant increase in size of patient's thalamic hemorrhage.  Patient was not administerd TPA secondary to hemorrhage. She was admitted to the neuro ICU for further evaluation and treatment.  SUBJECTIVE Her daughter is at the bedside.  Overall she feels her condition is stable. She is convinced her stroke is secondary to hemorrhage.  OBJECTIVE Most recent Vital Signs: Filed Vitals:   07/28/13 0500 07/28/13 0600 07/28/13 0630 07/28/13 0700  BP: 146/96 138/89 135/82 123/88  Pulse: 87 67 58 93  Temp:      TempSrc:      Resp: 19 17 18 22   Height:      Weight:      SpO2: 97% 98% 96% 96%   CBG (last 3)   Recent Labs  07/27/13 1549  GLUCAP 106*    IV Fluid Intake:   . sodium chloride 75 mL/hr at 07/28/13 0700    MEDICATIONS  . antiseptic oral rinse  15 mL Mouth Rinse BID  . influenza vac split quadrivalent PF  0.5 mL Intramuscular Tomorrow-1000  . pantoprazole (PROTONIX) IV  40 mg Intravenous QHS  . senna-docusate  1 tablet Oral BID   PRN:  acetaminophen, acetaminophen, labetalol  Diet:  Cardiac thin liquids Activity:  Bedrest DVT Prophylaxis:  SCDs   CLINICALLY SIGNIFICANT STUDIES Basic Metabolic Panel:  Recent Labs Lab  07/27/13 1141 07/27/13 1200  NA 139 140  K 4.6 4.7  CL 105 104  CO2  --  25  GLUCOSE 118* 102*  BUN 14 14  CREATININE 0.80 0.75  CALCIUM  --  10.5   Liver Function Tests:  Recent Labs Lab 07/27/13 1200  AST 16  ALT 15  ALKPHOS 70  BILITOT <0.2*  PROT 7.8  ALBUMIN 3.7   CBC:  Recent Labs Lab 07/27/13 1121 07/27/13 1141  WBC 6.6  --   NEUTROABS 4.5  --   HGB 12.3 15.0  HCT 38.5 44.0  MCV 85.9  --   PLT 244  --    Coagulation:  Recent Labs Lab 07/27/13 1121  LABPROT 12.2  INR 0.92   Cardiac Enzymes:  Recent Labs Lab 07/27/13 1200  TROPONINI <0.30   Urinalysis:  Recent Labs Lab 07/27/13 1333  COLORURINE YELLOW  LABSPEC 1.006  PHURINE 7.5  GLUCOSEU NEGATIVE  HGBUR NEGATIVE  BILIRUBINUR NEGATIVE  KETONESUR NEGATIVE  PROTEINUR NEGATIVE  UROBILINOGEN 0.2  NITRITE NEGATIVE  LEUKOCYTESUR NEGATIVE   Lipid Panel No results found for this basename: chol, trig, hdl, cholhdl, vldl, ldlcalc   HgbA1C  No results found for this basename: HGBA1C    Urine Drug Screen:     Component Value Date/Time   LABOPIA NONE DETECTED 07/27/2013 1333   COCAINSCRNUR NONE DETECTED 07/27/2013 1333   LABBENZ NONE DETECTED 07/27/2013 1333   AMPHETMU NONE DETECTED 07/27/2013 1333  THCU NONE DETECTED 07/27/2013 1333   LABBARB NONE DETECTED 07/27/2013 1333    Alcohol Level:  Recent Labs Lab 07/27/13 1200  ETH <11   Physical Exam: HEENT- Normocephalic, s/p EVD placement Neck supple with no masses, nodes, nodules or enlargement.  Cardiovascular - regular rate and rhythm, S1, S2 normal, no murmur, click, rub or gallop  Lungs - chest clear, no wheezing, rales, normal symmetric air entry, Heart exam - S1, S2 normal, no murmur, no gallop, rate regular  Abdomen - soft, non-tender; bowel sounds normal; no masses, no organomegaly  Extremities - no joint deformities, effusion, or inflammation, no edema and no skin discoloration  Neurologic Examination:  Mental Status:  oriented,  slightly confused. Speech slightly dysarthric without evidence of aphasia. Able to follow commands better with right extremities than left extremities.  Cranial Nerves:  II-Visual fields were normal.  III/IV/VI-Pupils were equal and reacted. Extraocular movements were full and conjugate.  V/VII-no facial numbness and no facial weakness.  VIII-normal.  X-mild dysarthria.  Motor: Moderate weakness of left upper extremity proximally and distally and mild weakness of left lower extremity proximally.  Sensory: Reduced perception of tactile sensation over left extremities her extremities.  Deep Tendon Reflexes: 2+ and symmetric.  Plantars: Extensor on the left and flexor on the right     CT of the brain   07/27/2013   Again noted right thalamic hemorrhage with extension into the third ventricle and some blood in anterior horn of the right lateral ventricle. Again noted blood in duct of Sylvius to the top of fourth ventricle. Mild increase in right to left mass effect axial image 14 measures about 5 mm. Again noted the obstructive hydrocephalus with minimal progression. No new focus of hemorrhage is noted.    07/27/2013   1. Acute hemorrhage in the right thalamus with extension into the third and right lateral ventricles and into the aqueduct of Sylvius. 2. Dilatation of the lateral ventricles consistent with obstructive hydrocephalus.  MRI of the brain    MRA of the brain    2D Echocardiogram    Carotid Doppler    CXR    EKG    Therapy Recommendations   Physical Exam    ASSESSMENT Morgan Collins is a 58 y.o. female presenting with new-onset headache and side weakness and numbness. Imaging confirms a right thalamic hemorrhage with intraventricular extension and cytotoxic cerebral edema, midline shift of 74mm, obstructive hydrocephalus. IVC placed by Dr. Joya Salm. Hemorrhage most likely secondary to accelerated hypertension.  On no antithrombotics prior to admission. Patient with  resultant left hemiparesis, headache. Work up underway.  Hypertension, 174/113 Hyperlipidemia Hx depression Thyroid disease  Hospital day # 1  TREATMENT/PLAN  Continue ICU level care  SBP goal < 160  Bedrest given IVC  Ultram for headache management along with tylenol.  Check MRI, MRA  CT head in am  Burnetta Sabin, MSN, RN, ANVP-BC, ANP-BC, [facility] Pager: [phone] 07/28/2013 7:49 AM  Patient's continued evaluation and management required complex decision-making as well as repeat diagnostic studies because of critical clinical status, discussed care with consultants and family. Total critical care time involved was 35 minutes.   Jim Like, DO Stroke-Neurology

## 2013-07-28 NOTE — Op Note (Signed)
NAMEMarland Kitchen  MAKITA, BLOW NO.:  [id number]  MEDICAL RECORD NO.:  [id number]  LOCATION:  3M11C                        FACILITY:  [facility]  PHYSICIAN:  Leeroy Cha, M.D.   DATE OF BIRTH:  02-21-1956  DATE OF PROCEDURE: DATE OF DISCHARGE:                              OPERATIVE REPORT   PREOPERATIVE DIAGNOSIS:  Right thalamic hemorrhage with early hydrocephalus.  POSTOPERATIVE DIAGNOSIS:  Right thalamic hemorrhage with early hydrocephalus.  PROCEDURE:  Insertion of a right frontal ventricular catheter for CSF drain.  SURGEON:  Leeroy Cha, MD  CLINICAL HISTORY:  Ms. Bisesi is a lady who was admitted after having sudden onset of headache with decreased level of conscious.  The patient had 2 CT scans done in the emergency room which showed thalamic hemorrhage with early hydrocephalus.  I spoke with the family.  I spoke with the neurologist, and all of [place] agreed with the need of intraventricular catheter to drain the CSF and prevent worsening of the hydrocephalus.  DESCRIPTION OF PROCEDURE:  The right side of the head was shaved and cleaned with Betadine and DuraPrep.  Then, the scalp was infiltrated and incision in front of the coronal suture was done.  With the help of the drill, we made a burr hole, and the dura mater was opened with the 18- gauge needle.  Then, catheter was inserted into the frontal area and CSF came with a pink-tint color with increase of pressure.  The catheter, the distal end, was tunneled underneath the scalp and brought through a different incision.  The previous incision was closed with nylon.  The catheter was secured in place and is going to be draining into the bag, 10 cm water.  The patient did well during the procedure.          ______________________________ Leeroy Cha, M.D.     EB/MEDQ  D:  07/27/2013  T:  07/28/2013  Job:  [id number]

## 2013-07-28 NOTE — Progress Notes (Signed)
PT Cancellation Note  Patient Details Name: Morgan Collins MRN: [id number] DOB: [birth year]   Cancelled Treatment:    Reason Eval/Treat Not Completed: Medical issues which prohibited therapy;Patient not medically ready. Pt continues to be on bedrest per RN due to IVC drain placement. Will attempt to see pt when activity orders are updated.    Elie Confer Allison, [place] 07/28/2013, 9:46 AM

## 2013-07-28 NOTE — Plan of Care (Signed)
Problem: Acute Treatment Outcomes Goal: Neuro exam at baseline or improved Outcome: Progressing improving

## 2013-07-28 NOTE — Progress Notes (Signed)
OT Cancellation Note  Patient Details Name: Morgan Collins MRN: [id number] DOB: Feb 25, 1956   Cancelled Treatment:    Reason Eval/Treat Not Completed: Patient not medically ready Please update activity orders when pt able to participate. [place], OTR/L  [phone] 07/28/2013 07/28/2013, 10:47 AM

## 2013-07-28 NOTE — Progress Notes (Signed)
Patient ID: Morgan Collins, female   DOB: 16-Feb-1956, 58 y.o.   MRN: [id number] Stable, ivc working well. Pinkish csf. To repeat ct head in the next24  - 48 hours

## 2013-07-28 NOTE — Progress Notes (Signed)
Received in report from day shift nurse that pt had vomited at [place]. Emesis was described as undigested food. Pt states she was coughing and the emesis came up. She says she did not experience nausea. Pt also reports intermittent blurry vision when trying to focus her eyes that had started prior to admission. She states she wears reading glasses at home. Pt neuro status intact, pupils round and reactive. Day shift nurse paged Neurology, but did not receive a response. I paged a 2nd time at 2021 and the unit secretary paged a 3rd time at 2055. Page was returned at 2057 by Dr. Aram Beecham. Orders given for zofran PRN and to continue to monitor.   2300 Pt has not had any nausea or vomiting since.

## 2013-07-29 ENCOUNTER — Inpatient Hospital Stay (HOSPITAL_COMMUNITY): Payer: BC Managed Care – PPO

## 2013-07-29 LAB — URINALYSIS, ROUTINE W REFLEX MICROSCOPIC
Bilirubin Urine: NEGATIVE
Glucose, UA: NEGATIVE mg/dL
Ketones, ur: NEGATIVE mg/dL
Nitrite: NEGATIVE
Protein, ur: 100 mg/dL — AB
Specific Gravity, Urine: 1.023 (ref 1.005–1.030)
Urobilinogen, UA: 0.2 mg/dL (ref 0.0–1.0)
pH: 8 (ref 5.0–8.0)

## 2013-07-29 LAB — URINE MICROSCOPIC-ADD ON

## 2013-07-29 MED ORDER — PANTOPRAZOLE SODIUM 40 MG PO TBEC
40.0000 mg | DELAYED_RELEASE_TABLET | Freq: Every day | ORAL | Status: DC
  Administered 2013-07-30 – 2013-08-03 (×6): 40 mg via ORAL
  Filled 2013-07-29 (×6): qty 1

## 2013-07-29 NOTE — Evaluation (Signed)
Physical Therapy Evaluation Patient Details Name: Morgan Collins MRN: [id number] DOB: 24-Aug-1955 Today's Date: 07/29/2013 Time: 7829-5621 PT Time Calculation (min): 21 min  PT Assessment / Plan / Recommendation History of Present Illness  Morgan Collins is an 58 y.o. female history hypertension, hyperlipidemia and hypothyroidism and poor compliance with taking prescribed medications presenting with complaint of headache this morning and sudden onset of weakness and numbness involving left side at 10 AM 07/27/2013. She has no previous history of stroke nor TIA. CT scan of her head showed right thalamic hemorrhage with extension into lateral and third ventricles with mass effect on the third ventricle and signs of early ventricle enlargement. NIH stroke score was 11. Patient became increasingly more lethargic in the emergency room. Repeat CT of her head showed a slight increase in size of her lateral ventricles indicative of early hydrocephalus. There was no significant increase in size of patient's thalamic hemorrhage.  Patient was not administerd TPA secondary to hemorrhage.  Clinical Impression  Pt adm due to the above. Pt presents with decreased independence with functional mobility secondary to deficits indicated below (see PT problem list). Pt to benefit from skilled PT to address deficits and increase mobility prior to D/C to next venue. Pt very defensive and becomes agitated with assessments and questions. Pt with decreased gaze to left; will benefit from more in depth visual exam, when pt is agreeable and will to participate. Pt to be a great candidate for CIR upon acute D/C. Pt was independent PTA and daughter reports family will be able to support her upon D/C.   PT Assessment  Patient needs continued PT services    Follow Up Recommendations  CIR    Does the patient have the potential to tolerate intense rehabilitation      Barriers to Discharge        Equipment Recommendations  Other (comment) (TBD)    Recommendations for Other Services OT consult;Rehab consult   Frequency Min 4X/week    Precautions / Restrictions Precautions Precautions: Fall Precaution Comments: IVC drain  Restrictions Weight Bearing Restrictions: No   Pertinent Vitals/Pain Stable t/o session; denies pain; c/o numbness/tingling in bil LEs.       Mobility  Bed Mobility Overal bed mobility: Needs Assistance Bed Mobility: Supine to Sit Supine to sit: Min assist;HOB elevated General bed mobility comments: pt requires incr time; cues for hand placement and sequencing; pt impulsive with bed mobility; cues for safety with lines/leads Transfers Overall transfer level: Needs assistance Equipment used: 2 person hand held assist Transfers: Sit to/from Stand;Stand Pivot Transfers Sit to Stand: +2 safety/equipment;Min assist Stand pivot transfers: +2 safety/equipment;Min assist General transfer comment: pt very impulsive and demo decr safety awareness during transfer; pt unsteady with initial sit to stand; + sway noted; (A) to maintain balance; max directional cues for sequencing  Ambulation/Gait General Gait Details: not assessed  Modified Rankin (Stroke Patients Only) Pre-Morbid Rankin Score: No symptoms Modified Rankin: Moderately severe disability         PT Diagnosis: Difficulty walking  PT Problem List: Decreased activity tolerance;Decreased balance;Decreased mobility;Decreased cognition;Decreased knowledge of use of DME;Decreased safety awareness;Decreased knowledge of precautions;Impaired sensation PT Treatment Interventions: DME instruction;Gait training;Stair training;Functional mobility training;Therapeutic activities;Therapeutic exercise;Balance training;Neuromuscular re-education;Patient/family education     PT Goals(Current goals can be found in the care plan section) Acute Rehab PT Goals Patient Stated Goal: to go home tomorrow PT Goal Formulation: With patient Time For  Goal Achievement: 08/12/13 Potential to Achieve Goals: Good  Visit Information  Last PT Received On: 07/29/13 Assistance Needed: +2 (for safety and lines/leads) History of Present Illness: Morgan Collins is an 58 y.o. female history hypertension, hyperlipidemia and hypothyroidism and poor compliance with taking prescribed medications presenting with complaint of headache this morning and sudden onset of weakness and numbness involving left side at 10 AM 07/27/2013. She has no previous history of stroke nor TIA. CT scan of her head showed right thalamic hemorrhage with extension into lateral and third ventricles with mass effect on the third ventricle and signs of early ventricle enlargement. NIH stroke score was 11. Patient became increasingly more lethargic in the emergency room. Repeat CT of her head showed a slight increase in size of her lateral ventricles indicative of early hydrocephalus. There was no significant increase in size of patient's thalamic hemorrhage.  Patient was not administerd TPA secondary to hemorrhage.       Prior [place] expects to be discharged to:: Private residence Living Arrangements: Alone Available Help at Discharge: Family;Available 24 hours/day Type of Home: Other(Comment) (split level apartment ) Home Access: Stairs to enter [employer] of Steps: 5 Entrance Stairs-Rails: Left;Right;Can reach both Home Layout: Two level;Bed/bath upstairs Alternate Level Stairs-Number of Steps: 12 Alternate Level Stairs-Rails: Left Home Equipment: None Prior Function Level of Independence: Independent Communication Communication: No difficulties Dominant Hand: Right    Cognition  Cognition Arousal/Alertness: Awake/alert Behavior During Therapy: Impulsive;Anxious;Agitated Overall Cognitive Status: Impaired/Different from baseline Area of Impairment: Orientation;Attention;Following commands;Safety/judgement;Awareness;Problem  solving Orientation Level: Disoriented to;Place;Situation Current Attention Level: Sustained Memory: Decreased short-term memory Following Commands: Follows one step commands with increased time Safety/Judgement: Decreased awareness of deficits;Decreased awareness of safety Awareness: Emergent Problem Solving: Slow processing;Difficulty sequencing;Requires verbal cues;Requires tactile cues General Comments: pt appeared to be very agitated with questions and during assessment; family present to help gather information regarding PLOF and baseline    Extremity/Trunk Assessment Upper Extremity Assessment Upper Extremity Assessment: Defer to OT evaluation Lower Extremity Assessment Lower Extremity Assessment: Overall WFL for tasks assessed (c/o bil LE "numbness/tingling" ) Cervical / Trunk Assessment Cervical / Trunk Assessment: Normal   Balance Balance Overall balance assessment: Needs assistance Sitting-balance support: Feet supported;Single extremity supported Sitting balance-Leahy Scale: Fair Sitting balance - Comments: tolerated sitting EOB ~4 min Standing balance support: During functional activity;Bilateral upper extremity supported Standing balance-Leahy Scale: Poor Standing balance comment: UEs supported by 2 person handheld (A)  General Comments General comments (skin integrity, edema, etc.): pt c/o bil LE numbness/tingling; seemed to perseverate on wanting candy from family entire session   End of Session PT - End of Session Equipment Utilized During Treatment: Gait belt Activity Tolerance: Patient tolerated treatment well Patient left: in chair;with call bell/phone within reach;with family/visitor present Nurse Communication: Mobility status;Precautions  GP     Gustavus Bryant, [place] 07/29/2013, 3:06 PM

## 2013-07-29 NOTE — Progress Notes (Signed)
Patient ID: Morgan Collins, female   DOB: [birth year], 58 y.o.   MRN: [id number] Satble, eating, no weakness. ivc working. Ct head in am

## 2013-07-29 NOTE — Progress Notes (Addendum)
Stroke Team Progress Note  HISTORY Morgan Collins is an 58 y.o. female history hypertension, hyperlipidemia and hypothyroidism and poor compliance with taking prescribed medications presenting with complaint of headache this morning and sudden onset of weakness and numbness involving left side at 10 AM 07/27/2013. She has no previous history of stroke nor TIA. CT scan of her head showed right thalamic hemorrhage with extension into lateral and third ventricles with mass effect on the third ventricle and signs of early ventricle enlargement. NIH stroke score was 11. Patient became increasingly more lethargic in the emergency room. Repeat CT of her head showed a slight increase in size of her lateral ventricles indicative of early hydrocephalus. There was no significant increase in size of patient's thalamic hemorrhage.  Patient was not administerd TPA secondary to hemorrhage. She was admitted to the neuro ICU for further evaluation and treatment.  SUBJECTIVE Had episode of emesis, neurologically stable, no further episodes. Had repeat head CT which was stable. Patient alert, responsive, mildly confused, reports emesis episode was 2 nights ago. Having leaking from around Foley site.   OBJECTIVE Most recent Vital Signs: Filed Vitals:   07/29/13 0324 07/29/13 0400 07/29/13 0500 07/29/13 0600  BP:  138/95 148/92 141/87  Pulse:  63 62 61  Temp: 98.4 F (36.9 C)     TempSrc: Oral     Resp:  12 11 10   Height:      Weight:      SpO2:  97% 93% 95%   CBG (last 3)   Recent Labs  07/27/13 1549  GLUCAP 106*    IV Fluid Intake:   . sodium chloride 75 mL/hr at 07/29/13 0700    MEDICATIONS  . antiseptic oral rinse  15 mL Mouth Rinse BID  . influenza vac split quadrivalent PF  0.5 mL Intramuscular Tomorrow-1000  . pantoprazole (PROTONIX) IV  40 mg Intravenous QHS  . senna-docusate  1 tablet Oral BID   PRN:  acetaminophen, acetaminophen, labetalol, ondansetron (ZOFRAN) IV, traMADol  Diet:   Cardiac thin liquids Activity:  Bedrest DVT Prophylaxis:  SCDs   CLINICALLY SIGNIFICANT STUDIES Basic Metabolic Panel:   Recent Labs Lab 07/27/13 1141 07/27/13 1200  NA 139 140  K 4.6 4.7  CL 105 104  CO2  --  25  GLUCOSE 118* 102*  BUN 14 14  CREATININE 0.80 0.75  CALCIUM  --  10.5   Liver Function Tests:   Recent Labs Lab 07/27/13 1200  AST 16  ALT 15  ALKPHOS 70  BILITOT <0.2*  PROT 7.8  ALBUMIN 3.7   CBC:   Recent Labs Lab 07/27/13 1121 07/27/13 1141  WBC 6.6  --   NEUTROABS 4.5  --   HGB 12.3 15.0  HCT 38.5 44.0  MCV 85.9  --   PLT 244  --    Coagulation:   Recent Labs Lab 07/27/13 1121  LABPROT 12.2  INR 0.92   Cardiac Enzymes:   Recent Labs Lab 07/27/13 1200  TROPONINI <0.30   Urinalysis:   Recent Labs Lab 07/27/13 1333  COLORURINE YELLOW  LABSPEC 1.006  PHURINE 7.5  GLUCOSEU NEGATIVE  HGBUR NEGATIVE  BILIRUBINUR NEGATIVE  KETONESUR NEGATIVE  PROTEINUR NEGATIVE  UROBILINOGEN 0.2  NITRITE NEGATIVE  LEUKOCYTESUR NEGATIVE   Lipid Panel No results found for this basename: chol,  trig,  hdl,  cholhdl,  vldl,  ldlcalc   HgbA1C  No results found for this basename: HGBA1C    Urine Drug Screen:     Component  Value Date/Time   LABOPIA NONE DETECTED 07/27/2013 1333   COCAINSCRNUR NONE DETECTED 07/27/2013 1333   LABBENZ NONE DETECTED 07/27/2013 1333   AMPHETMU NONE DETECTED 07/27/2013 1333   THCU NONE DETECTED 07/27/2013 1333   LABBARB NONE DETECTED 07/27/2013 1333    Alcohol Level:   Recent Labs Lab 07/27/13 1200  ETH <11   Physical Exam: HEENT- Normocephalic, s/p EVD placement Neck supple with no masses, nodes, nodules or enlargement.  Cardiovascular - regular rate and rhythm, S1, S2 normal, no murmur, click, rub or gallop  Lungs - chest clear, no wheezing, rales, normal symmetric air entry Abdomen - soft, non-tender; bowel sounds normal; no masses, no organomegaly  Extremities - no joint deformities, effusion, or  inflammation, no edema and no skin discoloration  Neurologic Examination:  Mental Status:  oriented, slightly confused. Speech slightly dysarthric without evidence of aphasia. Cranial Nerves:  II-Visual fields were normal.  III/IV/VI-Pupils were equal and reacted. Extraocular movements were full and conjugate.  V/VII-no facial numbness and no facial weakness.  VIII-normal.  X-mild dysarthria.  Motor: Moderate weakness of left upper extremity proximally and distally and mild weakness of left lower extremity proximally.  Sensory: Reduced perception of tactile sensation over left extremities her extremities.  Deep Tendon Reflexes: 2+ and symmetric.  Plantars: Extensor on the left and flexor on the right     CT of the brain   07/29/2013 1. Stable size of right the lytic hemorrhage with intraventricular extension into the third ventricle with small amount of blood present within the cerebral aqueduct. Localized mass effect with slight right-to-left midline shift at the level of the hemorrhage is unchanged. 2. Interval placement of right frontal approach ventricular catheter with tip near the foramen of Monro. Overall ventricular size is decreased relative to prior exam from 07/27/2013 without CT evidence of obstructive hydrocephalus. 07/27/2013   Again noted right thalamic hemorrhage with extension into the third ventricle and some blood in anterior horn of the right lateral ventricle. Again noted blood in duct of Sylvius to the top of fourth ventricle. Mild increase in right to left mass effect axial image 14 measures about 5 mm. Again noted the obstructive hydrocephalus with minimal progression. No new focus of hemorrhage is noted.    07/27/2013   1. Acute hemorrhage in the right thalamus with extension into the third and right lateral ventricles and into the aqueduct of Sylvius. 2. Dilatation of the lateral ventricles consistent with obstructive hydrocephalus.  MRI of the brain    MRA of the brain     EKG    Therapy Recommendations   ASSESSMENT Morgan Collins is a 58 y.o. female presenting with new-onset headache and side weakness and numbness. Imaging confirms a right thalamic hemorrhage with intraventricular extension and cytotoxic cerebral edema, midline shift of 18mm, obstructive hydrocephalus. IVC placed by Dr. Joya Salm. CT remains stable without evidence of hydrocephalus. Hemorrhage most likely secondary to accelerated hypertension.  On no antithrombotics prior to admission. Patient with resultant left hemiparesis, headache. Work up underway.  Hypertension, 174/113 Hyperlipidemia Hx depression Thyroid disease Headache, on ultram prn  Hospital day # 2  TREATMENT/PLAN  Continue ICU level care  SBP goal < 180, will restart home BP meds when indicated  Bedrest given IVC, IVC management per NSX  Check UA and culture  F/u MRI, MRA    Jim Like, DO Stroke-Neurology

## 2013-07-29 NOTE — Progress Notes (Signed)
OT Cancellation Note  Patient Details Name: Morgan Collins MRN: [id number] DOB: 1955-11-14   Cancelled Treatment:    Reason Eval/Treat Not Completed: Patient not medically ready (Pt on bedrest)  [place], OTR/L  [phone] 07/29/2013 07/29/2013, 9:41 AM

## 2013-07-29 NOTE — Progress Notes (Signed)
Rehab Admissions Coordinator Note:  Patient was screened by Retta Diones for appropriateness for an Inpatient Acute Rehab Consult.  At this time, we are recommending Inpatient Rehab consult.  Retta Diones 07/29/2013, 4:59 PM  I can be reached at [phone].

## 2013-07-30 ENCOUNTER — Inpatient Hospital Stay (HOSPITAL_COMMUNITY): Payer: BC Managed Care – PPO

## 2013-07-30 MED ORDER — SULFAMETHOXAZOLE-TMP DS 800-160 MG PO TABS
1.0000 | ORAL_TABLET | Freq: Two times a day (BID) | ORAL | Status: DC
  Administered 2013-07-30 – 2013-08-04 (×11): 1 via ORAL
  Filled 2013-07-30 (×13): qty 1

## 2013-07-30 NOTE — Progress Notes (Signed)
Physical Therapy Treatment Patient Details Name: Morgan Collins MRN: [id number] DOB: Nov 06, 1955 Today's Date: 07/30/2013 Time: 2595-6387 PT Time Calculation (min): 25 min  PT Assessment / Plan / Recommendation  History of Present Illness Morgan Collins is an 58 y.o. female history hypertension, hyperlipidemia and hypothyroidism and poor compliance with taking prescribed medications presenting with complaint of headache this morning and sudden onset of weakness and numbness involving left side at 10 AM 07/27/2013. She has no previous history of stroke nor TIA. CT scan of her head showed right thalamic hemorrhage with extension into lateral and third ventricles with mass effect on the third ventricle and signs of early ventricle enlargement. NIH stroke score was 11. Patient became increasingly more lethargic in the emergency room. Repeat CT of her head showed a slight increase in size of her lateral ventricles indicative of early hydrocephalus. There was no significant increase in size of patient's thalamic hemorrhage.  Patient was not administerd TPA secondary to hemorrhage.   PT Comments   Pt continued to deflect deficits throughout treatment session. Pt with Rt gaze throughout session. Able to increase ambulation today; pt demo gt abnormalities and decreased safety awareness. Pt is a fall risk. Pt continues to be a good candidate for CIR. Will cont to follow per POC.   Follow Up Recommendations  CIR     Does the patient have the potential to tolerate intense rehabilitation     Barriers to Discharge        Equipment Recommendations  Other (comment)    Recommendations for Other Services OT consult;Rehab consult  Frequency Min 4X/week   Progress towards PT Goals Progress towards PT goals: Progressing toward goals  Plan Current plan remains appropriate    Precautions / Restrictions Precautions Precautions: Fall Precaution Comments: IVC drain  Restrictions Weight Bearing  Restrictions: No   Pertinent Vitals/Pain C/o "pain in eyes" did not rate pain.     Mobility  Bed Mobility Overal bed mobility: Needs Assistance Bed Mobility: Supine to Sit Supine to sit: Min assist;HOB elevated General bed mobility comments: (A) to elevate trunk to sitting position on EOB; incr time to sequence  Transfers Overall transfer level: Needs assistance Equipment used: 2 person hand held assist Transfers: Sit to/from Stand Sit to Stand: Mod assist General transfer comment: (A) to maintain balance with upright standing; pt leaning posteriorly with standing; + sway Ambulation/Gait Ambulation/Gait assistance: Min assist Ambulation Distance (Feet): 50 Feet Assistive device: 1 person hand held assist Gait Pattern/deviations: Shuffle;Step-through pattern;Wide base of support;Decreased stride length;Drifts right/left Gait velocity: decreased Gait velocity interpretation: Below normal speed for age/gender General Gait Details: pt unsteady with ambulation; continues to deflect deficits; pt gazing Rt during ambulating; fatigues quickly; cues for sequencing; encouraged to increase step length  Modified Rankin (Stroke Patients Only) Pre-Morbid Rankin Score: No symptoms Modified Rankin: Moderately severe disability    Exercises Other Exercises Other Exercises: continued to attempt assessing vision; pt deflected and demo incr gaze to Rt throughout session    PT Diagnosis:    PT Problem List:   PT Treatment Interventions:     PT Goals (current goals can now be found in the care plan section) Acute Rehab PT Goals Patient Stated Goal: "im just ready to go home"  PT Goal Formulation: With patient Time For Goal Achievement: 08/12/13 Potential to Achieve Goals: Good  Visit Information  Last PT Received On: 07/30/13 Assistance Needed: +2 History of Present Illness: Morgan Collins is an 58 y.o. female history hypertension, hyperlipidemia and  hypothyroidism and poor compliance with  taking prescribed medications presenting with complaint of headache this morning and sudden onset of weakness and numbness involving left side at 10 AM 07/27/2013. She has no previous history of stroke nor TIA. CT scan of her head showed right thalamic hemorrhage with extension into lateral and third ventricles with mass effect on the third ventricle and signs of early ventricle enlargement. NIH stroke score was 11. Patient became increasingly more lethargic in the emergency room. Repeat CT of her head showed a slight increase in size of her lateral ventricles indicative of early hydrocephalus. There was no significant increase in size of patient's thalamic hemorrhage.  Patient was not administerd TPA secondary to hemorrhage.    Subjective Data  Subjective: Pt lying supine; communicated that she was just 'frustrated'; stated "you dont know what its like when people are doing things to you that you dont want" pt given max encouragement for participation  Patient Stated Goal: "im just ready to go home"    Cognition  Cognition Arousal/Alertness: Awake/alert Behavior During Therapy: Impulsive;Anxious;Agitated Overall Cognitive Status: Impaired/Different from baseline Area of Impairment: Problem solving;Safety/judgement Following Commands: Follows one step commands with increased time Safety/Judgement: Decreased awareness of deficits;Decreased awareness of safety General Comments: Pt continues to deflect when questioning      Balance  Balance Overall balance assessment: Needs assistance Sitting-balance support: Feet supported;Bilateral upper extremity supported Sitting balance-Leahy Scale: Fair Sitting balance - Comments: sat EOB for orthostatics; BP stable  Postural control: Posterior lean Standing balance support: During functional activity;Single extremity supported Standing balance-Leahy Scale: Fair Standing balance comment: Lt UE supported throughout session + sway noted at times   End of  Session PT - End of Session Equipment Utilized During Treatment: Gait belt Activity Tolerance: Patient tolerated treatment well Nurse Communication: Mobility status;Precautions   GP     Gustavus Bryant, [place] [phone] 07/30/2013, 4:31 PM

## 2013-07-30 NOTE — Progress Notes (Signed)
Stroke Team Progress Note  HISTORY Morgan Collins is an 57 y.o. female history hypertension, hyperlipidemia and hypothyroidism and poor compliance with taking prescribed medications presenting with complaint of headache this morning and sudden onset of weakness and numbness involving left side at 10 AM 07/27/2013. She has no previous history of stroke nor TIA. CT scan of her head showed right thalamic hemorrhage with extension into lateral and third ventricles with mass effect on the third ventricle and signs of early ventricle enlargement. NIH stroke score was 11. Patient became increasingly more lethargic in the emergency room. Repeat CT of her head showed a slight increase in size of her lateral ventricles indicative of early hydrocephalus. There was no significant increase in size of patient's thalamic hemorrhage.  Patient was not administerd TPA secondary to hemorrhage. She was admitted to the neuro ICU for further evaluation and treatment.  SUBJECTIVE Per RN no overnight events, blood pressure stable. Patient resting comfortably, slightly disoriented though, per family, this appears to be baseline   OBJECTIVE Most recent Vital Signs: Filed Vitals:   07/30/13 0400 07/30/13 0500 07/30/13 0600 07/30/13 0700  BP: 127/94 150/89 151/94 161/97  Pulse: 66 56 61 55  Temp:      TempSrc:      Resp: 11 9 12 17   Height:      Weight:      SpO2: 96% 93% 97% 95%   CBG (last 3)   Recent Labs  07/27/13 1549  GLUCAP 106*    IV Fluid Intake:   . sodium chloride 75 mL/hr at 07/30/13 0700    MEDICATIONS  . antiseptic oral rinse  15 mL Mouth Rinse BID  . influenza vac split quadrivalent PF  0.5 mL Intramuscular Tomorrow-1000  . pantoprazole  40 mg Oral QHS  . senna-docusate  1 tablet Oral BID   PRN:  acetaminophen, acetaminophen, labetalol, ondansetron (ZOFRAN) IV, traMADol  Diet:  Cardiac thin liquids Activity:  Bedrest DVT Prophylaxis:  SCDs   CLINICALLY SIGNIFICANT STUDIES Basic  Metabolic Panel:   Recent Labs Lab 07/27/13 1141 07/27/13 1200  NA 139 140  K 4.6 4.7  CL 105 104  CO2  --  25  GLUCOSE 118* 102*  BUN 14 14  CREATININE 0.80 0.75  CALCIUM  --  10.5   Liver Function Tests:   Recent Labs Lab 07/27/13 1200  AST 16  ALT 15  ALKPHOS 70  BILITOT <0.2*  PROT 7.8  ALBUMIN 3.7   CBC:   Recent Labs Lab 07/27/13 1121 07/27/13 1141  WBC 6.6  --   NEUTROABS 4.5  --   HGB 12.3 15.0  HCT 38.5 44.0  MCV 85.9  --   PLT 244  --    Coagulation:   Recent Labs Lab 07/27/13 1121  LABPROT 12.2  INR 0.92   Cardiac Enzymes:   Recent Labs Lab 07/27/13 1200  TROPONINI <0.30   Urinalysis:   Recent Labs Lab 07/27/13 1333 07/29/13 0857  COLORURINE YELLOW YELLOW  LABSPEC 1.006 1.023  PHURINE 7.5 8.0  GLUCOSEU NEGATIVE NEGATIVE  HGBUR NEGATIVE LARGE*  BILIRUBINUR NEGATIVE NEGATIVE  KETONESUR NEGATIVE NEGATIVE  PROTEINUR NEGATIVE 100*  UROBILINOGEN 0.2 0.2  NITRITE NEGATIVE NEGATIVE  LEUKOCYTESUR NEGATIVE LARGE*   Lipid Panel No results found for this basename: chol,  trig,  hdl,  cholhdl,  vldl,  ldlcalc   HgbA1C  No results found for this basename: HGBA1C    Urine Drug Screen:     Component Value Date/Time   LABOPIA  NONE DETECTED 07/27/2013 1333   COCAINSCRNUR NONE DETECTED 07/27/2013 1333   LABBENZ NONE DETECTED 07/27/2013 1333   AMPHETMU NONE DETECTED 07/27/2013 1333   THCU NONE DETECTED 07/27/2013 1333   LABBARB NONE DETECTED 07/27/2013 1333    Alcohol Level:   Recent Labs Lab 07/27/13 1200  ETH <11   Physical Exam: HEENT- Normocephalic, s/p EVD placement Neck supple with no masses, nodes, nodules or enlargement.  Cardiovascular - regular rate and rhythm, S1, S2 normal, no murmur, click, rub or gallop  Lungs - chest clear, no wheezing, rales, normal symmetric air entry Abdomen - soft, non-tender; bowel sounds normal; no masses, no organomegaly  Extremities - no joint deformities, effusion, or inflammation, no edema  and no skin discoloration  Neurologic Examination:  Mental Status:  oriented, slightly confused. Speech slightly dysarthric without evidence of aphasia. Cranial Nerves:  II-Visual fields were normal.  III/IV/VI-Pupils were equal and reacted. Extraocular movements were full and conjugate.  V/VII-no facial numbness and no facial weakness.  VIII-normal.  X-mild dysarthria.  Motor: Mild weakness of left upper extremity proximally and distally and mild weakness of left lower extremity proximally.  Sensory: Reduced perception of tactile sensation over left extremities   Deep Tendon Reflexes: 2+ and symmetric.  Plantars: Extensor on the left and flexor on the right     CT of the brain   07/29/2013 1. Stable size of right the lytic hemorrhage with intraventricular extension into the third ventricle with small amount of blood present within the cerebral aqueduct. Localized mass effect with slight right-to-left midline shift at the level of the hemorrhage is unchanged. 2. Interval placement of right frontal approach ventricular catheter with tip near the foramen of Monro. Overall ventricular size is decreased relative to prior exam from 07/27/2013 without CT evidence of obstructive hydrocephalus. 07/27/2013   Again noted right thalamic hemorrhage with extension into the third ventricle and some blood in anterior horn of the right lateral ventricle. Again noted blood in duct of Sylvius to the top of fourth ventricle. Mild increase in right to left mass effect axial image 14 measures about 5 mm. Again noted the obstructive hydrocephalus with minimal progression. No new focus of hemorrhage is noted.    07/27/2013   1. Acute hemorrhage in the right thalamus with extension into the third and right lateral ventricles and into the aqueduct of Sylvius. 2. Dilatation of the lateral ventricles consistent with obstructive hydrocephalus.  MRI of the brain   MRI head: Right mesial thalamic hematoma with  intraventricular  extension. No hydrocephalus, right frontal ventriculostomy catheter  in situ. 1-2 mm right parietal subdural hematoma may be post  procedural.  Symmetric abnormal cerebellar signal abnormality with volume loss,  in addition to left occipital remote hemorrhage, constellation of  findings may reflect sequelae of PRES, though are nonspecific.  Scattered intracranial foci of susceptibility artifact/micro  hemorrhages may reflect chronic hypertension.  MRA head: Dolicoectatic appearance of the intracranial vessels  suggest sequela of chronic hypertension. No hemodynamically  significant luminal irregularity of the right M2/3 branches suggest  intracranial atherosclerosis.   EKG    Therapy Recommendations   ASSESSMENT Morgan Collins is a 58 y.o. female presenting with new-onset headache and side weakness and numbness. Imaging confirms a right thalamic hemorrhage with intraventricular extension and cytotoxic cerebral edema, midline shift of 34mm, obstructive hydrocephalus. IVC placed by Dr. Joya Salm. CT remains stable without evidence of hydrocephalus. Hemorrhage most likely secondary to accelerated hypertension.  On no antithrombotics prior to admission. Antithrombotics  now held due to [place]. Patient with resultant left hemiparesis, headache. Work up underway.  Hypertension, 174/113 Hyperlipidemia Hx depression Thyroid disease Headache, on ultram prn  Hospital day # 3  TREATMENT/PLAN  Continue ICU level care  SBP goal < 180, will restart home BP meds when indicated  Bedrest given IVC, IVC management per NSX  Will hold on repeat head CT as MRI from evening 2/12 appears stable  Check UA and culture, will start bactrim with culture pending  Rehab evaluation pending   This patient is critically ill and at significant risk of neurological worsening, death and care requires constant monitoring of vital signs, hemodynamics,respiratory and cardiac monitoring,  neurological assessment, discussion with family, other specialists and medical decision making of high complexity. I spent 35 minutes of neurocritical care time in the care of this patient   Jim Like, DO Stroke-Neurology

## 2013-07-30 NOTE — Progress Notes (Signed)
Occupational Therapy Evaluation Patient Details Name: OZELLE BRUBACHER MRN: [id number] DOB: 31-May-1956 Today's Date: 07/30/2013 Time: 4818-5909 OT Time Calculation (min): 18 min  OT Assessment / Plan / Recommendation History of present illness SELEENA REIMERS is an 58 y.o. female history hypertension, hyperlipidemia and hypothyroidism and poor compliance with taking prescribed medications presenting with complaint of headache this morning and sudden onset of weakness and numbness involving left side at 10 AM 07/27/2013. She has no previous history of stroke nor TIA. CT scan of her head showed right thalamic hemorrhage with extension into lateral and third ventricles with mass effect on the third ventricle and signs of early ventricle enlargement. NIH stroke score was 11. Patient became increasingly more lethargic in the emergency room. Repeat CT of her head showed a slight increase in size of her lateral ventricles indicative of early hydrocephalus. There was no significant increase in size of patient's thalamic hemorrhage.  Patient was not administerd TPA secondary to hemorrhage.   Clinical Impression   Evaluation somewhat limited this pm due to lethargy. PTA, pt lived alone and was independent with all ADL and mobility. Pt with functional decline and will benefit from CIR to max independence with ADL and mobility to facilitate safe D/C home. Will further assess visual deficits next visit when less lethargic. Pt will benefit from skilled OT services to facilitate D/C to next venue due to below deficits.    OT Assessment  Patient needs continued OT Services    Follow Up Recommendations  CIR;Supervision/Assistance - 24 hour    Barriers to Discharge      Equipment Recommendations  3 in 1 bedside comode;Tub/shower bench    Recommendations for Other Services Rehab consult  Frequency  Min 3X/week    Precautions / Restrictions Precautions Precautions: Fall Precaution Comments: IVC drain  clamped at this time Restrictions Weight Bearing Restrictions: No   Pertinent Vitals/Pain C/o HA    ADL  Eating/Feeding: Supervision/safety Grooming: Supervision/safety;Set up Where Assessed - Grooming: Supported sitting Upper Body Bathing: Minimal assistance Where Assessed - Upper Body Bathing: Supported sitting Lower Body Bathing: Moderate assistance Where Assessed - Lower Body Bathing: Supported sit to stand Upper Body Dressing: Minimal assistance Where Assessed - Upper Body Dressing: Supported sitting Lower Body Dressing: Maximal assistance Where Assessed - Lower Body Dressing: Supported sit to Lobbyist: Moderate assistance Transfers/Ambulation Related to ADLs: mod A ADL Comments: significant decline    OT Diagnosis: Generalized weakness;Disturbance of vision;Cognitive deficits  OT Problem List: Decreased strength;Decreased activity tolerance;Impaired balance (sitting and/or standing);Impaired vision/perception;Decreased cognition;Pain;Decreased knowledge of precautions;Decreased safety awareness OT Treatment Interventions: Self-care/ADL training;Therapeutic exercise;Energy conservation;DME and/or AE instruction;Therapeutic activities;Cognitive remediation/compensation;Visual/perceptual remediation/compensation;Patient/family education;Balance training   OT Goals(Current goals can be found in the care plan section) Acute Rehab OT Goals Patient Stated Goal: I want to go home OT Goal Formulation: With patient Time For Goal Achievement: 08/13/13 Potential to Achieve Goals: Good  Visit Information  Last OT Received On: 07/30/13 Assistance Needed: +2 History of Present Illness: CLOE SOCKWELL is an 58 y.o. female history hypertension, hyperlipidemia and hypothyroidism and poor compliance with taking prescribed medications presenting with complaint of headache this morning and sudden onset of weakness and numbness involving left side at 10 AM 07/27/2013. She has no  previous history of stroke nor TIA. CT scan of her head showed right thalamic hemorrhage with extension into lateral and third ventricles with mass effect on the third ventricle and signs of early ventricle enlargement. NIH stroke score was 11. Patient became increasingly  more lethargic in the emergency room. Repeat CT of her head showed a slight increase in size of her lateral ventricles indicative of early hydrocephalus. There was no significant increase in size of patient's thalamic hemorrhage.  Patient was not administerd TPA secondary to hemorrhage.       Prior [place] expects to be discharged to:: Private residence Living Arrangements: Alone Available Help at Discharge: Family;Available 24 hours/day Type of Home: Other(Comment) Home Access: Stairs to enter Entrance Stairs-Number of Steps: 5 Entrance Stairs-Rails: Left;Right;Can reach both Home Layout: Two level;Bed/bath upstairs Alternate Level Stairs-Number of Steps: 12 Alternate Level Stairs-Rails: Left Home Equipment: None Prior Function Level of Independence: Independent Communication Communication: No difficulties Dominant Hand: Right         Vision/Perception Vision - History Baseline Vision: No visual deficits Vision - Assessment Eye Alignment: Impaired (comment) Vision Assessment: Vision impaired - to be further tested in functional context Additional Comments: dysconjugate gaze. Pt reports blurred vision. Difficult to fully assess due to lethargy Perception Perception: Not tested Praxis Praxis: Intact   Cognition  Cognition Arousal/Alertness: Lethargic Behavior During Therapy: Flat affect Overall Cognitive Status: Impaired/Different from baseline Area of Impairment: Attention;Following commands;Awareness;Safety/judgement;Problem solving Current Attention Level: Sustained Following Commands: Follows one step commands with increased time Safety/Judgement: Decreased  awareness of deficits Awareness: Emergent Problem Solving: Slow processing;Decreased initiation;Difficulty sequencing General Comments: very lethargic this session    Extremity/Trunk Assessment Upper Extremity Assessment Upper Extremity Assessment: Overall WFL for tasks assessed Lower Extremity Assessment Lower Extremity Assessment: Overall WFL for tasks assessed Cervical / Trunk Assessment Cervical / Trunk Assessment: Normal     Mobility Bed Mobility Overal bed mobility: Needs Assistance Bed Mobility: Supine to Sit Supine to sit: Min assist;HOB elevated General bed mobility comments: (A) to elevate trunk to sitting position on EOB; incr time to sequence  Transfers Overall transfer level: Needs assistance Equipment used: 2 person hand held assist Transfers: Sit to/from Stand Sit to Stand: Mod assist Stand pivot transfers: +2 safety/equipment;Min assist General transfer comment: (A) to maintain balance with upright standing; pt leaning posteriorly with standing; + sway     Exercise Other Exercises Other Exercises: continued to attempt assessing vision; pt deflected and demo incr gaze to Rt throughout session    Balance Balance Overall balance assessment: Needs assistance Sitting-balance support: Bilateral upper extremity supported;Feet supported Sitting balance-Leahy Scale: Fair Sitting balance - Comments: sat EOB for orthostatics; BP stable  Postural control: Posterior lean Standing balance support: During functional activity;Single extremity supported Standing balance-Leahy Scale: Fair Standing balance comment: Lt UE supported throughout session + sway noted at times    End of Session OT - End of Session Activity Tolerance: Patient limited by lethargy Patient left: in chair;with call bell/phone within reach Nurse Communication: Mobility status  GO     Cheyrl Buley,HILLARY 07/30/2013, 6:32 PM [facility], OTR/L  [phone] 07/30/2013

## 2013-07-30 NOTE — Plan of Care (Signed)
Problem: Progression Outcomes Goal: Progressive activity as tolerated Outcome: Progressing OOB to chair w/ 2 assist, tolerates well

## 2013-07-30 NOTE — Progress Notes (Signed)
UR completed.  Tyanne Derocher, RN BSN [facility] CCM Trauma/Neuro ICU Case Manager [phone]  

## 2013-07-30 NOTE — Progress Notes (Signed)
Patient ID: Morgan Collins, female   DOB: 1956/01/03, 58 y.o.   MRN: [id number] Ct head better. No weakness . Will clamp ivc for the next 24 hours and see how she does clinically

## 2013-07-31 LAB — CBC WITH DIFFERENTIAL/PLATELET
Basophils Absolute: 0 10*3/uL (ref 0.0–0.1)
Basophils Relative: 0 % (ref 0–1)
Eosinophils Absolute: 0.1 10*3/uL (ref 0.0–0.7)
Eosinophils Relative: 0 % (ref 0–5)
HCT: 41.8 % (ref 36.0–46.0)
Hemoglobin: 13.4 g/dL (ref 12.0–15.0)
Lymphocytes Relative: 17 % (ref 12–46)
Lymphs Abs: 2 10*3/uL (ref 0.7–4.0)
MCH: 27.8 pg (ref 26.0–34.0)
MCHC: 32.1 g/dL (ref 30.0–36.0)
MCV: 86.7 fL (ref 78.0–100.0)
Monocytes Absolute: 0.8 10*3/uL (ref 0.1–1.0)
Monocytes Relative: 6 % (ref 3–12)
Neutro Abs: 9.2 10*3/uL — ABNORMAL HIGH (ref 1.7–7.7)
Neutrophils Relative %: 77 % (ref 43–77)
Platelets: 292 10*3/uL (ref 150–400)
RBC: 4.82 MIL/uL (ref 3.87–5.11)
RDW: 15 % (ref 11.5–15.5)
WBC: 12 10*3/uL — ABNORMAL HIGH (ref 4.0–10.5)

## 2013-07-31 LAB — BASIC METABOLIC PANEL
BUN: 8 mg/dL (ref 6–23)
CO2: 24 mEq/L (ref 19–32)
Calcium: 11 mg/dL — ABNORMAL HIGH (ref 8.4–10.5)
Chloride: 97 mEq/L (ref 96–112)
Creatinine, Ser: 0.66 mg/dL (ref 0.50–1.10)
GFR calc Af Amer: >90 mL/min (ref >90)
GFR calc non Af Amer: >90 mL/min (ref >90)
Glucose, Bld: 108 mg/dL — ABNORMAL HIGH (ref 70–99)
Potassium: 4.8 mEq/L (ref 3.7–5.3)
Sodium: 133 mEq/L — ABNORMAL LOW (ref 137–147)

## 2013-07-31 LAB — URINE CULTURE: Colony Count: >=100000

## 2013-07-31 NOTE — Progress Notes (Signed)
Patient having progressively worsening Head ache, and nausea/vomiting. Spoke with Dr. Hal Neer and he ordered a Head Ct to see how she was since the IVC drain had been clamped since 1200. The Ct results showed a slight increase in the size of her ventricle and Dr.Kritzer ordered to re-open the IVC drain at 9cm H2O.

## 2013-07-31 NOTE — Progress Notes (Signed)
Patient ID: Morgan Collins, female   DOB: 1955/08/06, 58 y.o.   MRN: [id number] Afeb, vss No new neuro sisues. Her drain was clamped yesterday, and during the night she noted incresing headache with nausea. We got a CT head that showed some increased ventricular size with increased temporal horns.  We therefore re opened the drain and she feels somewhat better now. Obviously at this point she is still drain dependent.

## 2013-07-31 NOTE — Progress Notes (Signed)
Stroke Team Progress Note  HISTORY Morgan Collins is an 58 y.o. female history hypertension, hyperlipidemia and hypothyroidism and poor compliance with taking prescribed medications presenting with complaint of headache this morning and sudden onset of weakness and numbness involving left side at 10 AM 07/27/2013. She has no previous history of stroke nor TIA. CT scan of her head showed right thalamic hemorrhage with extension into lateral and third ventricles with mass effect on the third ventricle and signs of early ventricle enlargement. NIH stroke score was 11. Patient became increasingly more lethargic in the emergency room. Repeat CT of her head showed a slight increase in size of her lateral ventricles indicative of early hydrocephalus. There was no significant increase in size of patient's thalamic hemorrhage.  Patient was not administerd TPA secondary to hemorrhage. She was admitted to the neuro ICU for further evaluation and treatment.  SUBJECTIVE  The family reports that she's doing pretty well. She does complain of some having frontal headaches. It appears that the headaches recurred after the drain was clamped indicating that she probably will need permanent shunting.  OBJECTIVE Most recent Vital Signs: Filed Vitals:   07/31/13 0409 07/31/13 0500 07/31/13 0600 07/31/13 0700  BP:  170/103 174/98 157/93  Pulse:  81 87 85  Temp: 99 F (37.2 C)     TempSrc: Oral     Resp:  25 14 15   Height:      Weight:      SpO2:  91% 93% 92%   CBG (last 3)  No results found for this basename: GLUCAP,  in the last 72 hours  IV Fluid Intake:   . sodium chloride 75 mL/hr at 07/30/13 2356    MEDICATIONS  . antiseptic oral rinse  15 mL Mouth Rinse BID  . influenza vac split quadrivalent PF  0.5 mL Intramuscular Tomorrow-1000  . pantoprazole  40 mg Oral QHS  . senna-docusate  1 tablet Oral BID  . sulfamethoxazole-trimethoprim  1 tablet Oral Q12H   PRN:  acetaminophen, acetaminophen,  labetalol, ondansetron (ZOFRAN) IV, traMADol  Diet:  Cardiac thin liquids Activity:  Bedrest DVT Prophylaxis:  SCDs   CLINICALLY SIGNIFICANT STUDIES Basic Metabolic Panel:   Recent Labs Lab 07/27/13 1141 07/27/13 1200  NA 139 140  K 4.6 4.7  CL 105 104  CO2  --  25  GLUCOSE 118* 102*  BUN 14 14  CREATININE 0.80 0.75  CALCIUM  --  10.5   Liver Function Tests:   Recent Labs Lab 07/27/13 1200  AST 16  ALT 15  ALKPHOS 70  BILITOT <0.2*  PROT 7.8  ALBUMIN 3.7   CBC:   Recent Labs Lab 07/27/13 1121 07/27/13 1141  WBC 6.6  --   NEUTROABS 4.5  --   HGB 12.3 15.0  HCT 38.5 44.0  MCV 85.9  --   PLT 244  --    Coagulation:   Recent Labs Lab 07/27/13 1121  LABPROT 12.2  INR 0.92   Cardiac Enzymes:   Recent Labs Lab 07/27/13 1200  TROPONINI <0.30   Urinalysis:   Recent Labs Lab 07/27/13 1333 07/29/13 0857  COLORURINE YELLOW YELLOW  LABSPEC 1.006 1.023  PHURINE 7.5 8.0  GLUCOSEU NEGATIVE NEGATIVE  HGBUR NEGATIVE LARGE*  BILIRUBINUR NEGATIVE NEGATIVE  KETONESUR NEGATIVE NEGATIVE  PROTEINUR NEGATIVE 100*  UROBILINOGEN 0.2 0.2  NITRITE NEGATIVE NEGATIVE  LEUKOCYTESUR NEGATIVE LARGE*   Lipid Panel No results found for this basename: chol,  trig,  hdl,  cholhdl,  vldl,  ldlcalc  HgbA1C  No results found for this basename: HGBA1C    Urine Drug Screen:     Component Value Date/Time   LABOPIA NONE DETECTED 07/27/2013 1333   COCAINSCRNUR NONE DETECTED 07/27/2013 1333   LABBENZ NONE DETECTED 07/27/2013 1333   AMPHETMU NONE DETECTED 07/27/2013 1333   THCU NONE DETECTED 07/27/2013 1333   LABBARB NONE DETECTED 07/27/2013 1333    Alcohol Level:   Recent Labs Lab 07/27/13 1200  ETH <11   Physical Exam: HEENT- Normocephalic, s/p EVD placement Neck supple with no masses, nodes, nodules or enlargement.  Cardiovascular - regular rate and rhythm, S1, S2 normal, no murmur, click, rub or gallop  Lungs - chest clear, no wheezing, rales, normal  symmetric air entry Abdomen - soft, non-tender; bowel sounds normal; no masses, no organomegaly  Extremities - no joint deformities, effusion, or inflammation, no edema and no skin discoloration  Neurologic Examination:  Mental Status:  She is sleeping but easily arousable. She is oriented to person and place hospital and year. She follows commands well.  Cranial Nerves:  II-Visual fields were normal.  III/IV/VI-Pupils were equal and reacted. Extraocular movements were full and conjugate.  V/VII-no facial numbness and no facial weakness.  VIII-normal.  X-mild dysarthria.  Motor:  Right upper extremity 5; left upper extremity mild pronator drift but actually has good strength on direct testing. She has antigravity strength of the legs. She has support stockings and the SCD. Sensory: Reduced perception of tactile sensation over left extremities   Deep Tendon Reflexes: 2+ and symmetric.  Plantars: Extensor on the left and flexor on the right     CT of the brain   07/29/2013 1. Stable size of right the lytic hemorrhage with intraventricular extension into the third ventricle with small amount of blood present within the cerebral aqueduct. Localized mass effect with slight right-to-left midline shift at the level of the hemorrhage is unchanged. 2. Interval placement of right frontal approach ventricular catheter with tip near the foramen of Monro. Overall ventricular size is decreased relative to prior exam from 07/27/2013 without CT evidence of obstructive hydrocephalus. 07/27/2013   Again noted right thalamic hemorrhage with extension into the third ventricle and some blood in anterior horn of the right lateral ventricle. Again noted blood in duct of Sylvius to the top of fourth ventricle. Mild increase in right to left mass effect axial image 14 measures about 5 mm. Again noted the obstructive hydrocephalus with minimal progression. No new focus of hemorrhage is noted.    07/27/2013   1. Acute  hemorrhage in the right thalamus with extension into the third and right lateral ventricles and into the aqueduct of Sylvius. 2. Dilatation of the lateral ventricles consistent with obstructive hydrocephalus.  MRI of the brain   MRI head: Right mesial thalamic hematoma with intraventricular  extension. No hydrocephalus, right frontal ventriculostomy catheter  in situ. 1-2 mm right parietal subdural hematoma may be post  procedural.  Symmetric abnormal cerebellar signal abnormality with volume loss,  in addition to left occipital remote hemorrhage, constellation of  findings may reflect sequelae of PRES, though are nonspecific.  Scattered intracranial foci of susceptibility artifact/micro  hemorrhages may reflect chronic hypertension.  MRA head: Dolicoectatic appearance of the intracranial vessels  suggest sequela of chronic hypertension. No hemodynamically  significant luminal irregularity of the right M2/3 branches suggest  intracranial atherosclerosis.   EKG    Therapy Recommendations   ASSESSMENT Ms. HERBERT KLEVE is a 58 y.o. female presenting with new-onset headache  and side weakness and numbness. Imaging confirms a right thalamic hemorrhage with intraventricular extension and cytotoxic cerebral edema, midline shift of 9mm, obstructive hydrocephalus. IVC placed by Dr. Joya Salm. CT remains stable without evidence of hydrocephalus. Hemorrhage most likely secondary to accelerated hypertension.  On no antithrombotics prior to admission. Antithrombotics now held due to [place]. Patient with resultant left hemiparesis, headache. Work up underway.  Hypertension, 174/113 Hyperlipidemia Hx depression Thyroid disease Headache, on ultram prn  Hospital day # 4  TREATMENT/PLAN  Continue ICU level care  SBP goal < 180, will restart home BP meds when indicated  Bedrest given IVC, IVC management per NSX  Will hold on repeat head CT as MRI from evening 2/12 appears stable  Check UA and  culture, will start bactrim with culture pending  Rehab evaluation pending   This patient is critically ill and at significant risk of neurological worsening, death and care requires constant monitoring of vital signs, hemodynamics,respiratory and cardiac monitoring, neurological assessment, discussion with family, other specialists and medical decision making of high complexity. I spent 35 minutes of neurocritical care time in the care of this patient

## 2013-07-31 NOTE — Progress Notes (Signed)
Physical Therapy Treatment Patient Details Name: Morgan Collins MRN: [id number] DOB: Jan 17, 1956 Today's Date: 07/31/2013 Time: 8469-6295 PT Time Calculation (min): 21 min  PT Assessment / Plan / Recommendation  History of Present Illness Rt thalamic hemorrhage with extension into lateral ventricles. Pt with IVC placed. NIH stroke score initially 11.   PT Comments   Pt more lethargic during session today. Pt was limited in mobility due to increasing balance deficits and lethargy. Pt c/o headache during session. Rn notified of changes in mobility. Pt continues to have dominate Rt gaze, encouraged to increase looking Lt. Pt continues to be great candidate for CIR. Will cont to follow per POC.   Follow Up Recommendations  CIR     Does the patient have the potential to tolerate intense rehabilitation     Barriers to Discharge        Equipment Recommendations  Other (comment)    Recommendations for Other Services OT consult;Rehab consult  Frequency Min 4X/week   Progress towards PT Goals Progress towards PT goals: Progressing toward goals  Plan Current plan remains appropriate    Precautions / Restrictions Precautions Precautions: Fall Precaution Comments: IVC drain clamped at this time Restrictions Weight Bearing Restrictions: No   Pertinent Vitals/Pain Stable t/o session.     Mobility  Bed Mobility Overal bed mobility: Needs Assistance Bed Mobility: Supine to Sit Supine to sit: Min assist;HOB elevated General bed mobility comments: (A) to elevate trunk to sitting position on EOB; incr time to sequence  Transfers Overall transfer level: Needs assistance Equipment used: 2 person hand held assist Transfers: Sit to/from Stand Sit to Stand: +2 safety/equipment;Min assist General transfer comment: Pt much more unsteady with transfers today vs last session; pt took 3 attempts to shift weight anteriorly for momentum to achieve upright standing position; cues for hand placement  and sequencing; 2 person (A) to maintain balance; pt bracing against bed and +sway posterioly  Ambulation/Gait Ambulation/Gait assistance: Mod assist Ambulation Distance (Feet): 25 Feet Assistive device: Rolling walker (2 wheeled) Gait Pattern/deviations: Shuffle;Drifts right/left;Wide base of support;Decreased stride length Gait velocity: decreased Gait velocity interpretation: <1.8 ft/sec, indicative of risk for recurrent falls General Gait Details: pt had 2 LOB today with ambulation and was leaning posteriorly and to left throughout session; pt greatly limited in mobility today due to being unsafe; cues to keep eyes open during ambulation; pt was verbal but seemed unaware of balance deficits; required (A) to maintain balance and manage RW; 2nd person very helpful for safety and to manage lines; pt inconsisten with mobility   Modified Rankin (Stroke Patients Only) Pre-Morbid Rankin Score: No symptoms Modified Rankin: Moderately severe disability    Exercises Other Exercises Other Exercises: encouraged to look left more; pt continues to have increased gaze to the Rt    PT Diagnosis:    PT Problem List:   PT Treatment Interventions:     PT Goals (current goals can now be found in the care plan section) Acute Rehab PT Goals Patient Stated Goal: none stated today PT Goal Formulation: With patient Time For Goal Achievement: 08/12/13 Potential to Achieve Goals: Good  Visit Information  Last PT Received On: 07/31/13 Assistance Needed: +2 History of Present Illness: Rt thalamic hemorrhage with extension into lateral ventricles. Pt with IVC placed. NIH stroke score initially 11.    Subjective Data  Subjective: pt lying supine; sleepy but arousable with verbal cues; keeps eyes closed but communicates with increased time  Patient Stated Goal: none stated today   Cognition  Cognition Arousal/Alertness: Lethargic Behavior During Therapy: Flat affect Overall Cognitive Status:  Impaired/Different from baseline Area of Impairment: Attention;Following commands;Awareness;Safety/judgement;Problem solving Current Attention Level: Sustained Memory: Decreased short-term memory Following Commands: Follows one step commands with increased time Safety/Judgement: Decreased awareness of deficits Problem Solving: Slow processing;Decreased initiation;Difficulty sequencing General Comments: pt more lethargic this session; delayed responses today     Balance  Balance Overall balance assessment: Needs assistance Sitting-balance support: Feet supported;Bilateral upper extremity supported Sitting balance-Leahy Scale: Fair Sitting balance - Comments: sat EOB ~5 min  Postural control: Posterior lean Standing balance support: During functional activity;Bilateral upper extremity supported Standing balance-Leahy Scale: Poor Standing balance comment: +sway posteriorly; unsteady today; min (A) to maintain balance   End of Session PT - End of Session Equipment Utilized During Treatment: Gait belt Activity Tolerance: Patient tolerated treatment well Patient left: in chair;with call bell/phone within reach Nurse Communication: Mobility status;Precautions   GP     Gustavus Bryant, [place] 07/31/2013, 2:46 PM

## 2013-07-31 NOTE — Progress Notes (Signed)
Pt c/o of HA this morning. Given PO Ultram per orders. Pt became nauseous and vomited very small amount in basin. No neuro change noted. Zofran given. MD notified. Will cont to monitor.

## 2013-08-01 MED ORDER — BISACODYL 10 MG RE SUPP
10.0000 mg | Freq: Every day | RECTAL | Status: DC | PRN
  Administered 2013-08-01: 10 mg via RECTAL
  Filled 2013-08-01: qty 1

## 2013-08-01 MED ORDER — HYDROCHLOROTHIAZIDE 12.5 MG PO CAPS
12.5000 mg | ORAL_CAPSULE | Freq: Every day | ORAL | Status: DC
Start: 2013-08-01 — End: 2013-08-04
  Administered 2013-08-01 – 2013-08-02 (×2): 12.5 mg via ORAL
  Filled 2013-08-01 (×4): qty 1

## 2013-08-01 MED ORDER — LISINOPRIL 20 MG PO TABS
20.0000 mg | ORAL_TABLET | Freq: Every day | ORAL | Status: DC
  Administered 2013-08-01 – 2013-08-02 (×2): 20 mg via ORAL
  Filled 2013-08-01 (×4): qty 1

## 2013-08-01 NOTE — Progress Notes (Signed)
Stroke Team Progress Note  HISTORY Morgan Collins is an 58 y.o. female history hypertension, hyperlipidemia and hypothyroidism and poor compliance with taking prescribed medications presenting with complaint of headache on the morning of 07/27/2013 and sudden onset of weakness and numbness involving left side at 10 AM 07/27/2013. She hadno previous history of stroke nor TIA. CT scan of her head showed right thalamic hemorrhage with extension into lateral and third ventricles with mass effect on the third ventricle and signs of early ventricle enlargement. NIH stroke score was 11. Patient became increasingly more lethargic in the emergency room. Repeat CT of her head showed a slight increase in size of her lateral ventricles indicative of early hydrocephalus. There was no significant increase in size of patient's thalamic hemorrhage.  Patient was not administerd TPA secondary to hemorrhage. She was admitted to the neuro ICU for further evaluation and treatment.  SUBJECTIVE  She does not complain of a headache today. She however complains of abdominal discomfort especially in the epigastrium. She complains of being constipated. It appears she has not had a bowel movement since being hospitalized.  OBJECTIVE Most recent Vital Signs: Filed Vitals:   08/01/13 0500 08/01/13 0600 08/01/13 0700 08/01/13 0800  BP: 157/92 157/98 145/95 164/96  Pulse: 79 70 84 71  Temp:      TempSrc:      Resp: 15 16 18 16   Height:      Weight:      SpO2: 100% 99% 99% 95%   CBG (last 3)  No results found for this basename: GLUCAP,  in the last 72 hours  IV Fluid Intake:   . sodium chloride 75 mL/hr at 08/01/13 0700    MEDICATIONS  . antiseptic oral rinse  15 mL Mouth Rinse BID  . influenza vac split quadrivalent PF  0.5 mL Intramuscular Tomorrow-1000  . pantoprazole  40 mg Oral QHS  . senna-docusate  1 tablet Oral BID  . sulfamethoxazole-trimethoprim  1 tablet Oral Q12H   PRN:  acetaminophen, acetaminophen,  labetalol, ondansetron (ZOFRAN) IV, traMADol  Diet:  Cardiac thin liquids Activity:  Bedrest DVT Prophylaxis:  SCDs   CLINICALLY SIGNIFICANT STUDIES Basic Metabolic Panel:   Recent Labs Lab 07/27/13 1200 07/31/13 0912  NA 140 133*  K 4.7 4.8  CL 104 97  CO2 25 24  GLUCOSE 102* 108*  BUN 14 8  CREATININE 0.75 0.66  CALCIUM 10.5 11.0*   Liver Function Tests:   Recent Labs Lab 07/27/13 1200  AST 16  ALT 15  ALKPHOS 70  BILITOT <0.2*  PROT 7.8  ALBUMIN 3.7   CBC:   Recent Labs Lab 07/27/13 1121 07/27/13 1141 07/31/13 0912  WBC 6.6  --  12.0*  NEUTROABS 4.5  --  9.2*  HGB 12.3 15.0 13.4  HCT 38.5 44.0 41.8  MCV 85.9  --  86.7  PLT 244  --  292   Coagulation:   Recent Labs Lab 07/27/13 1121  LABPROT 12.2  INR 0.92   Cardiac Enzymes:   Recent Labs Lab 07/27/13 1200  TROPONINI <0.30   Urinalysis:   Recent Labs Lab 07/27/13 1333 07/29/13 0857  COLORURINE YELLOW YELLOW  LABSPEC 1.006 1.023  PHURINE 7.5 8.0  GLUCOSEU NEGATIVE NEGATIVE  HGBUR NEGATIVE LARGE*  BILIRUBINUR NEGATIVE NEGATIVE  KETONESUR NEGATIVE NEGATIVE  PROTEINUR NEGATIVE 100*  UROBILINOGEN 0.2 0.2  NITRITE NEGATIVE NEGATIVE  LEUKOCYTESUR NEGATIVE LARGE*   Lipid Panel No results found for this basename: chol,  trig,  hdl,  cholhdl,  vldl,  ldlcalc   HgbA1C  No results found for this basename: HGBA1C    Urine Drug Screen:     Component Value Date/Time   LABOPIA NONE DETECTED 07/27/2013 1333   COCAINSCRNUR NONE DETECTED 07/27/2013 1333   LABBENZ NONE DETECTED 07/27/2013 1333   AMPHETMU NONE DETECTED 07/27/2013 1333   THCU NONE DETECTED 07/27/2013 1333   LABBARB NONE DETECTED 07/27/2013 1333    Alcohol Level:   Recent Labs Lab 07/27/13 1200  ETH <11   Physical Exam: HEENT- Normocephalic, s/p EVD placement Neck supple with no masses, nodes, nodules or enlargement.  Cardiovascular - regular rate and rhythm, S1, S2 normal, no murmur, click, rub or gallop  Lungs - chest  clear, no wheezing, rales, normal symmetric air entry Abdomen - soft, non-tender; bowel sounds normal; no masses, no organomegaly  Extremities - no joint deformities, effusion, or inflammation, no edema and no skin discoloration  Neurologic Examination:  Mental Status:  She is sleeping but easily arousable. She is oriented to person and place hospital and year. She follows commands well.  Cranial Nerves:  II-Visual fields were normal.  III/IV/VI-Pupils were equal and reacted. Extraocular movements were full and conjugate.  V/VII-no facial numbness and no facial weakness.  VIII-normal.  X-mild dysarthria.  Motor:  Right upper extremity 5; left upper extremity mild pronator drift but actually has good strength on direct testing. She has antigravity 4-/5 strength of the legs. She has support stockings and the SCD. Sensory: Reduced perception of tactile sensation over left extremities   Deep Tendon Reflexes: 2+ and symmetric.  Plantars: Extensor on the left and flexor on the right     CT of the brain   07/29/2013 1. Stable size of right the lytic hemorrhage with intraventricular extension into the third ventricle with small amount of blood present within the cerebral aqueduct. Localized mass effect with slight right-to-left midline shift at the level of the hemorrhage is unchanged. 2. Interval placement of right frontal approach ventricular catheter with tip near the foramen of Monro. Overall ventricular size is decreased relative to prior exam from 07/27/2013 without CT evidence of obstructive hydrocephalus. 07/27/2013   Again noted right thalamic hemorrhage with extension into the third ventricle and some blood in anterior horn of the right lateral ventricle. Again noted blood in duct of Sylvius to the top of fourth ventricle. Mild increase in right to left mass effect axial image 14 measures about 5 mm. Again noted the obstructive hydrocephalus with minimal progression. No new focus of hemorrhage  is noted.    07/27/2013   1. Acute hemorrhage in the right thalamus with extension into the third and right lateral ventricles and into the aqueduct of Sylvius. 2. Dilatation of the lateral ventricles consistent with obstructive hydrocephalus.  CT of the brain 07/30/2013 1. Slight interval increase in size of the lateral ventricles status  post clamping of the ventricular catheter as detailed above. .  2. Stable size and appearance of right thalamic hemorrhage with  intraventricular extension. No new intracranial hemorrhage or other  process identified.  3. Nonvisualization of previously identified tiny right parietal  subdural hematoma.  MRI of the brain   07/29/2013 MRI head: Right mesial thalamic hematoma with intraventricular  extension. No hydrocephalus, right frontal ventriculostomy catheter  in situ. 1-2 mm right parietal subdural hematoma may be post  procedural.  Symmetric abnormal cerebellar signal abnormality with volume loss,  in addition to left occipital remote hemorrhage, constellation of  findings may reflect sequelae of PRES,  though are nonspecific.  Scattered intracranial foci of susceptibility artifact/micro  hemorrhages may reflect chronic hypertension.  MRA head: Dolicoectatic appearance of the intracranial vessels  suggest sequela of chronic hypertension. No hemodynamically  significant luminal irregularity of the right M2/3 branches suggest  intracranial atherosclerosis.   EKG  official reading is pending  Therapy Recommendations inpatient rehabilitation recommended  ASSESSMENT [name] is a 58 y.o. female presenting with new-onset headache and side weakness and numbness. Imaging confirms a right thalamic hemorrhage with intraventricular extension and cytotoxic cerebral edema, midline shift of 69mm, obstructive hydrocephalus. IVC placed by Dr. Joya Salm. CT remains stable without evidence of hydrocephalus. Hemorrhage most likely secondary to  accelerated hypertension.  On no antithrombotics prior to admission. Antithrombotics now held due to [place]. Patient with resultant left hemiparesis, headache. Work up underway.  Hypertension - 164/96 today - Labetalol PRN Hyperlipidemia - check lipid panel Hx depression Thyroid disease Headache, on ultram prn Constipation.  Hospital day # 5  TREATMENT/PLAN  Continue ICU level care  SBP goal < 180, will restart home BP meds when indicated  Bedrest given IVC, IVC management per NSX  Will hold on repeat head CT as MRI from evening 2/12 appears stable  Check UA and culture, will start bactrim with culture pending  Rehab evaluation pending  Dulcolax suppository when necessary  Mikey Bussing PA-C [facility] Pager [phone] 08/01/2013, 9:49 AM  This patient is critically ill and at significant risk of neurological worsening, death and care requires constant monitoring of vital signs, hemodynamics,respiratory and cardiac monitoring, neurological assessment, discussion with family, other specialists and medical decision making of high complexity.

## 2013-08-01 NOTE — Progress Notes (Signed)
Patient ID: Morgan Collins, female   DOB: [birth year], 58 y.o.   MRN: [id number] Afeb, vss CSF drain still working well Feels better than last few days. Hopefully will regain circulation of csf so she does not need a shunt. RX per Dr Joya Salm

## 2013-08-02 DIAGNOSIS — I619 Nontraumatic intracerebral hemorrhage, unspecified: Secondary | ICD-10-CM

## 2013-08-02 LAB — LIPID PANEL
Cholesterol: 343 mg/dL — ABNORMAL HIGH (ref 0–200)
HDL: 61 mg/dL (ref >39)
LDL Cholesterol: 263 mg/dL — ABNORMAL HIGH (ref 0–99)
Total CHOL/HDL Ratio: 5.6 RATIO
Triglycerides: 97 mg/dL (ref <150)
VLDL: 19 mg/dL (ref 0–40)

## 2013-08-02 MED ORDER — ENOXAPARIN SODIUM 40 MG/0.4ML ~~LOC~~ SOLN
40.0000 mg | SUBCUTANEOUS | Status: DC
  Administered 2013-08-02 – 2013-08-04 (×3): 40 mg via SUBCUTANEOUS
  Filled 2013-08-02 (×4): qty 0.4

## 2013-08-02 MED ORDER — ATORVASTATIN CALCIUM 10 MG PO TABS
10.0000 mg | ORAL_TABLET | Freq: Every day | ORAL | Status: DC
  Administered 2013-08-02 – 2013-08-03 (×2): 10 mg via ORAL
  Filled 2013-08-02 (×3): qty 1

## 2013-08-02 NOTE — Progress Notes (Signed)
Physical Therapy Treatment Patient Details Name: Morgan Collins MRN: [id number] DOB: Dec 25, 1955 Today's Date: 08/02/2013 Time: 1010-1039 PT Time Calculation (min): 29 min  PT Assessment / Plan / Recommendation  History of Present Illness Rt thalamic hemorrhage with extension into lateral ventricles. Pt with IVC placed. NIH stroke score initially 11.   PT Comments   Pt with improved ambulation tolerance this date but remains to have L sided weakness, decreased awareness of deficits, impaired safety awareness and impaired balance. Pt to con't to benefit from CIR upon d/c to maximal functional recovery prior to transitioning home.   Follow Up Recommendations  CIR     Does the patient have the potential to tolerate intense rehabilitation     Barriers to Discharge        Equipment Recommendations       Recommendations for Other Services OT consult;Rehab consult  Frequency Min 4X/week   Progress towards PT Goals Progress towards PT goals: Progressing toward goals  Plan Current plan remains appropriate    Precautions / Restrictions Precautions Precautions: Fall Precaution Comments: IVC drain clamped at this time Restrictions Weight Bearing Restrictions: No   Pertinent Vitals/Pain Denies pain    Mobility  Bed Mobility Overal bed mobility: Needs Assistance Bed Mobility: Supine to Sit Supine to sit: Min assist;HOB elevated General bed mobility comments: significant increase in time due to pt refusing assist, minA to prevent pt from falling backwards Transfers Overall transfer level: Needs assistance Equipment used: Rolling walker (2 wheeled) Transfers: Sit to/from Stand Sit to Stand: Mod assist;+2 safety/equipment General transfer comment: v/c's for safe hand placement, modA for anterior weight shift Ambulation/Gait Ambulation/Gait assistance: Mod assist;Max assist;+2 safety/equipment Ambulation Distance (Feet): 50 Feet Assistive device: Rolling walker (2 wheeled) Gait  Pattern/deviations: Step-to pattern;Decreased stride length;Wide base of support Gait velocity: decreased General Gait Details: pt wax/wean from mod to maxA. Pt with strong L lateral lean with onset of fatigue. max directional v/c's for safe walker management and pick up feet to clear floor vs shuffling steps Modified Rankin (Stroke Patients Only) Pre-Morbid Rankin Score: No symptoms Modified Rankin: Moderately severe disability    Exercises     PT Diagnosis:    PT Problem List:   PT Treatment Interventions:     PT Goals (current goals can now be found in the care plan section)    Visit Information  Last PT Received On: 08/02/13 Assistance Needed: +2 History of Present Illness: Rt thalamic hemorrhage with extension into lateral ventricles. Pt with IVC placed. NIH stroke score initially 11.    Subjective Data  Subjective: pt sleeping upon arrival but easily aroused. appears agitated with daughter but then became civil   Cognition  Cognition Arousal/Alertness: Awake/alert (once awokened) Behavior During Therapy: Restless (easily frustrated) Overall Cognitive Status: Impaired/Different from baseline Area of Impairment: Attention;Following commands;Awareness;Safety/judgement;Problem solving Current Attention Level: Sustained Memory: Decreased recall of precautions;Decreased short-term memory Following Commands: Follows one step commands with increased time Safety/Judgement: Decreased awareness of safety;Decreased awareness of deficits Awareness: Emergent Problem Solving: Slow processing;Decreased initiation;Difficulty sequencing General Comments: pt resisting assist with transfers and unaware of deficits    Balance  Balance Overall balance assessment: Needs assistance Sitting-balance support: Feet supported;No upper extremity supported Sitting balance-Leahy Scale: Fair Sitting balance - Comments: sat EOB ~5 min   End of Session PT - End of Session Equipment Utilized During  Treatment: Gait belt Activity Tolerance: Patient tolerated treatment well Patient left: in chair;with call bell/phone within reach Nurse Communication: Mobility status;Precautions   GP  Morgan Collins 08/02/2013, 11:47 AM  Morgan Collins, PT, DPT Pager #: [phone] Office #: [phone]

## 2013-08-02 NOTE — Progress Notes (Signed)
Patient ID: Morgan Collins, female   DOB: [birth year], 58 y.o.   MRN: [id number] Satble. ivc open this weekend. Ct shows a slight incraese of the ventricles. Will get a new ct in am

## 2013-08-02 NOTE — Progress Notes (Signed)
Stroke Team Progress Note  HISTORY Morgan Collins is an 58 y.o. female history hypertension, hyperlipidemia and hypothyroidism and poor compliance with taking prescribed medications presenting with complaint of headache on the morning of 07/27/2013 and sudden onset of weakness and numbness involving left side at 10 AM 07/27/2013. She had no previous history of stroke nor TIA. CT scan of her head showed right thalamic hemorrhage with extension into lateral and third ventricles with mass effect on the third ventricle and signs of early ventricle enlargement. NIH stroke score was 11. Patient became increasingly more lethargic in the emergency room. Repeat CT of her head showed a slight increase in size of her lateral ventricles indicative of early hydrocephalus. There was no significant increase in size of patient's thalamic hemorrhage.  Patient was not administerd TPA secondary to hemorrhage. She was admitted to the neuro ICU for further evaluation and treatment.  SUBJECTIVE Her daughter Morgan Collins is at the bedside.   OBJECTIVE Most recent Vital Signs: Filed Vitals:   08/02/13 0400 08/02/13 0500 08/02/13 0600 08/02/13 0700  BP: 110/69 112/79 101/71 130/87  Pulse: 72 71 77 75  Temp: 98.5 F (36.9 C)     TempSrc: Oral     Resp: 11 13 13 17   Height:      Weight: 98.9 kg (218 lb 0.6 oz)     SpO2: 92% 95% 94% 98%   CBG (last 3)  No results found for this basename: GLUCAP,  in the last 72 hours  IV Fluid Intake:   . sodium chloride 75 mL/hr at 08/02/13 0700    MEDICATIONS  . antiseptic oral rinse  15 mL Mouth Rinse BID  . hydrochlorothiazide  12.5 mg Oral Daily  . influenza vac split quadrivalent PF  0.5 mL Intramuscular Tomorrow-1000  . lisinopril  20 mg Oral Daily  . pantoprazole  40 mg Oral QHS  . senna-docusate  1 tablet Oral BID  . sulfamethoxazole-trimethoprim  1 tablet Oral Q12H   PRN:  acetaminophen, acetaminophen, bisacodyl, labetalol, ondansetron (ZOFRAN) IV, traMADol  Diet:   Cardiac thin liquids Activity:  Bedrest DVT Prophylaxis:  SCDs   CLINICALLY SIGNIFICANT STUDIES Basic Metabolic Panel:   Recent Labs Lab 07/27/13 1200 07/31/13 0912  NA 140 133*  K 4.7 4.8  CL 104 97  CO2 25 24  GLUCOSE 102* 108*  BUN 14 8  CREATININE 0.75 0.66  CALCIUM 10.5 11.0*   Liver Function Tests:   Recent Labs Lab 07/27/13 1200  AST 16  ALT 15  ALKPHOS 70  BILITOT <0.2*  PROT 7.8  ALBUMIN 3.7   CBC:   Recent Labs Lab 07/27/13 1121 07/27/13 1141 07/31/13 0912  WBC 6.6  --  12.0*  NEUTROABS 4.5  --  9.2*  HGB 12.3 15.0 13.4  HCT 38.5 44.0 41.8  MCV 85.9  --  86.7  PLT 244  --  292   Coagulation:   Recent Labs Lab 07/27/13 1121  LABPROT 12.2  INR 0.92   Cardiac Enzymes:   Recent Labs Lab 07/27/13 1200  TROPONINI <0.30   Urinalysis:   Recent Labs Lab 07/27/13 1333 07/29/13 0857  COLORURINE YELLOW YELLOW  LABSPEC 1.006 1.023  PHURINE 7.5 8.0  GLUCOSEU NEGATIVE NEGATIVE  HGBUR NEGATIVE LARGE*  BILIRUBINUR NEGATIVE NEGATIVE  KETONESUR NEGATIVE NEGATIVE  PROTEINUR NEGATIVE 100*  UROBILINOGEN 0.2 0.2  NITRITE NEGATIVE NEGATIVE  LEUKOCYTESUR NEGATIVE LARGE*   Lipid Panel     Component Value Date/Time   CHOL 343* 08/02/2013 0250   TRIG  97 08/02/2013 0250   HDL 61 08/02/2013 0250   CHOLHDL 5.6 08/02/2013 0250   VLDL 19 08/02/2013 0250   LDLCALC 263* 08/02/2013 0250    HgbA1C  No results found for this basename: HGBA1C    Urine Drug Screen:     Component Value Date/Time   LABOPIA NONE DETECTED 07/27/2013 1333   COCAINSCRNUR NONE DETECTED 07/27/2013 1333   LABBENZ NONE DETECTED 07/27/2013 1333   AMPHETMU NONE DETECTED 07/27/2013 1333   THCU NONE DETECTED 07/27/2013 1333   LABBARB NONE DETECTED 07/27/2013 1333    Alcohol Level:   Recent Labs Lab 07/27/13 1200  ETH <11    CT of the brain   07/30/2013 1. Slight interval increase in size of the lateral ventricles status post clamping of the ventricular catheter as detailed  above. . 2. Stable size and appearance of right thalamic hemorrhage with intraventricular extension. No new intracranial hemorrhage or other process identified.  3.Nonvisualization of previously identified tiny right parietal subdural hematoma. 07/29/2013 1. Stable size of right the lytic hemorrhage with intraventricular extension into the third ventricle with small amount of blood present within the cerebral aqueduct. Localized mass effect with slight right-to-left midline shift at the level of the hemorrhage is unchanged. 2. Interval placement of right frontal approach ventricular catheter with tip near the foramen of Monro. Overall ventricular size is decreased relative to prior exam from 07/27/2013 without CT evidence of obstructive hydrocephalus. 07/27/2013   Again noted right thalamic hemorrhage with extension into the third ventricle and some blood in anterior horn of the right lateral ventricle. Again noted blood in duct of Sylvius to the top of fourth ventricle. Mild increase in right to left mass effect axial image 14 measures about 5 mm. Again noted the obstructive hydrocephalus with minimal progression. No new focus of hemorrhage is noted.    07/27/2013   1. Acute hemorrhage in the right thalamus with extension into the third and right lateral ventricles and into the aqueduct of Sylvius. 2. Dilatation of the lateral ventricles consistent with obstructive hydrocephalus.  MRI of the brain  07/29/2013 Right mesial thalamic hematoma with intraventricular extension. No hydrocephalus, right frontal ventriculostomy catheter in situ. 1-2 mm right parietal subdural hematoma may be post procedural. Symmetric abnormal cerebellar signal abnormality with volume loss, in addition toleft occipital remote hemorrhage, constellation of findings may reflect sequelae of PRES, though are nonspecific. Scattered intracranial foci of susceptibility artifact/micro hemorrhages may reflect chronic hypertension.   MRA of the  brain [id number] Dolicoectatic appearance of the intracranial vessels suggest sequela of chronic hypertension. No hemodynamically significant luminal irregularity of the right M2/3 branches suggest intracranial atherosclerosis.   Physical Exam: HEENT- Normocephalic, s/p EVD placement Neck supple with no masses, nodes, nodules or enlargement.  Cardiovascular - regular rate and rhythm, S1, S2 normal, no murmur, click, rub or gallop  Lungs - chest clear, no wheezing, rales, normal symmetric air entry Abdomen - soft, non-tender; bowel sounds normal; no masses, no organomegaly  Extremities - no joint deformities, effusion, or inflammation, no edema and no skin discoloration  Neurologic Examination:  Mental Status:  She is awake alert She is oriented to person and place hospital and year. She follows commands well.  Cranial Nerves:  II-Visual fields were normal.  III/IV/VI-Pupils were equal and reacted. Extraocular movements were full and conjugate.  V/VII-no facial numbness and no facial weakness.  VIII-normal.  X-mild dysarthria.  Motor:  Right upper extremity 5; left upper extremity mild pronator drift but actually has good strength  on direct testing. She has antigravity 4-/5 strength of the legs. She has support stockings and the SCD. Sensory: Reduced perception of tactile sensation over left extremities   Deep Tendon Reflexes: 2+ and symmetric.  Plantars: Extensor on the left and flexor on the right    Therapy Recommendations inpatient rehabilitation    ASSESSMENT [name] is a 58 y.o. female presenting with new-onset headache and side weakness and numbness. Imaging confirms a right thalamic hemorrhage with intraventricular extension and cytotoxic cerebral edema, midline shift of 8mm, obstructive hydrocephalus. IVC placed by Dr. Joya Salm. CT remains stable without evidence of hydrocephalus. Hemorrhage most likely secondary to accelerated hypertension.  On no antithrombotics  prior to admission. Antithrombotics now held due to [place]. Patient with resultant left hemiparesis, headache, vertical gaze abnormality, dysarhtria.  Hypertension - 178/112 - Labetalol PRN, prinvil 20, hctz 12.5 Hyperlipidemia, LDL 263, on no statin PTA, now on no statin  Hx depression Thyroid disease Headache, on ultram prn Constipation. Dulcolax suppository when necessary UTI - proteus mirabilis, bactrim D4  IVC clamped 2/13 with increase in ventricular size & temporal horns. Drain reopened with relief.  Hospital day # 6  TREATMENT/PLAN  Continue ICU level care secondary to IVC. Dr. Leonie Man to discuss with neursurgery - thinks ok to clamp.  Continue SBP goal < 180. On home meds  Ok to be OOB, Rehab consult in place  lovenox for VTE prophy as hemorrhage is stable  Add statin given elevated LDL  NSL IVF  Morgan BIBY, MSN, RN, ANVP-BC, ANP-BC, GNP-BC [facility] Pager: [phone] 08/02/2013 7:38 AM This patient is critically ill and at significant risk of neurological worsening, death and care requires constant monitoring of vital signs, hemodynamics,respiratory and cardiac monitoring,review of multiple databases, neurological assessment, discussion with family, other specialists and medical decision making of high complexity. I spent 30 minutes of neurocritical care time  in the care of  this patient. I have personally obtained a history, examined the patient, evaluated imaging results, and formulated the assessment and plan of care. I agree with the above.  Antony Contras, MD

## 2013-08-02 NOTE — Progress Notes (Signed)
I met with pt and her daughter at bedside. Discussed inpt rehab venue as an option for her recovery. They are both in agreement. I will begin [employer] approval for when pt medically ready. Noted IVC drain. [phone]

## 2013-08-02 NOTE — Progress Notes (Signed)
SLP Cancellation Note  Patient Details Name: Morgan Collins MRN: [id number] DOB: 1956-01-18   Cancelled treatment:        SLP attempted to work with pt. on cognitive reorganization.  Sleeping upon arrival, eyes open with verbal and tactile stimuli, responds with one word "uh huh" but unable to maintain an awake state with multiple cues.  ST will continue efforts.   Orbie Pyo [place].[employer] [phone]  08/02/2013

## 2013-08-02 NOTE — Consult Note (Signed)
Physical Medicine and Rehabilitation Consult Reason for Consult: Right thalamic hemorrhage Referring Physician: Dr. Leonie Man   HPI: Morgan Collins is a 58 y.o. right-handed female with history of hypertension. Admitted 07/27/2013 with acute onset of headache and left-sided weakness. Cranial CT scan showed right thalamic hemorrhage with extension into lateral and third ventricles with mass effect on the third ventricle and signs of early ventricle enlargement indicative of early hydrocephalus. Underwent insertion of right frontal ventricular catheter for CSF drain 07/28/2013 per Dr. Joya Salm. Followup MRI of the brain 07/29/2013 showed no signs of hydrocephalus. Hospital course Proteus urinary tract infection placed on Bactrim. Tolerating a regular consistency diet. Physical and occupational therapy evaluations completed 07/29/2013 with recommendations of physical medicine rehabilitation consult.   Review of Systems  Neurological: Positive for dizziness, weakness and headaches.  All other systems reviewed and are negative.   Past Medical History  Diagnosis Date  . Hypertension   . Thyroid disease   . High cholesterol   . Depressed    History reviewed. No pertinent past surgical history. No family history on file. Social History:  reports that she has never smoked. She does not have any smokeless tobacco history on file. She reports that she does not drink alcohol. Her drug history is not on file. Allergies: No Known Allergies Medications Prior to Admission  Medication Sig Dispense Refill  . Fexofenadine HCl (MUCINEX ALLERGY PO) Take 7.5 mLs by mouth at bedtime as needed (allergies).      Marland Kitchen levothyroxine (SYNTHROID, LEVOTHROID) 75 MCG tablet Take 75 mcg by mouth daily before breakfast.      . lisinopril-hydrochlorothiazide (PRINZIDE,ZESTORETIC) 20-12.5 MG per tablet Take 1 tablet by mouth 2 (two) times daily.      Marland Kitchen NAPROXEN PO Take 1 tablet by mouth 2 (two) times daily as needed  (pain).      Marland Kitchen albuterol (PROVENTIL HFA;VENTOLIN HFA) 108 (90 BASE) MCG/ACT inhaler Inhale 1-2 puffs into the lungs every 6 (six) hours as needed for wheezing or shortness of breath.  1 Inhaler  0  . azithromycin (ZITHROMAX) 250 MG tablet Take 1 tablet (250 mg total) by mouth daily. Take first 2 tablets together, then 1 every day until finished.  6 tablet  0    Home: Home Living Family/patient expects to be discharged to:: Private residence Living Arrangements: Alone Available Help at Discharge: Family;Available 24 hours/day Type of Home: Other(Comment) Home Access: Stairs to enter Entrance Stairs-Number of Steps: 5 Entrance Stairs-Rails: Left;Right;Can reach both Home Layout: Two level;Bed/bath upstairs Alternate Level Stairs-Number of Steps: 12 Alternate Level Stairs-Rails: Left Home Equipment: None  Functional History: Prior Function Vocation: Full time employment Functional Status:  Mobility:     Ambulation/Gait Ambulation Distance (Feet): 25 Feet Gait velocity: decreased General Gait Details: pt had 2 LOB today with ambulation and was leaning posteriorly and to left throughout session; pt greatly limited in mobility today due to being unsafe; cues to keep eyes open during ambulation; pt was verbal but seemed unaware of balance deficits; required (A) to maintain balance and manage RW; 2nd person very helpful for safety and to manage lines; pt inconsisten with mobility      ADL: ADL Eating/Feeding: Supervision/safety Grooming: Supervision/safety;Set up Where Assessed - Grooming: Supported sitting Upper Body Bathing: Minimal assistance Where Assessed - Upper Body Bathing: Supported sitting Lower Body Bathing: Moderate assistance Where Assessed - Lower Body Bathing: Supported sit to stand Upper Body Dressing: Minimal assistance Where Assessed - Upper Body Dressing: Supported sitting Lower  Body Dressing: Maximal assistance Where Assessed - Lower Body Dressing: Supported sit  to stand Toilet Transfer: Moderate assistance Transfers/Ambulation Related to ADLs: mod A ADL Comments: significant decline  Cognition: Cognition Overall Cognitive Status: Impaired/Different from baseline Arousal/Alertness: Awake/alert Orientation Level: Oriented X4 (intermittently disoriented to month.) Attention: Sustained Sustained Attention: Impaired Sustained Attention Impairment: Verbal basic Memory: Impaired Memory Impairment: Decreased recall of new information;Decreased short term memory;Retrieval deficit Decreased Short Term Memory: Verbal basic;Functional basic Awareness: Impaired Awareness Impairment: Intellectual impairment;Emergent impairment;Anticipatory impairment Problem Solving: Impaired Problem Solving Impairment: Verbal basic;Functional basic Executive Function: Self Monitoring;Self Correcting;Decision Making Behaviors: Poor frustration tolerance Safety/Judgment: Impaired Cognition Arousal/Alertness: Lethargic Behavior During Therapy: Flat affect Overall Cognitive Status: Impaired/Different from baseline Area of Impairment: Attention;Following commands;Awareness;Safety/judgement;Problem solving Orientation Level: Disoriented to;Place;Situation Current Attention Level: Sustained Memory: Decreased short-term memory Following Commands: Follows one step commands with increased time Safety/Judgement: Decreased awareness of deficits Awareness: Emergent Problem Solving: Slow processing;Decreased initiation;Difficulty sequencing General Comments: pt more lethargic this session; delayed responses today   Blood pressure 101/71, pulse 77, temperature 98.5 F (36.9 C), temperature source Oral, resp. rate 13, height 4\' 9"  (1.448 m), weight 98.9 kg (218 lb 0.6 oz), SpO2 94.00%. Physical Exam  Vitals reviewed. Constitutional: She appears well-developed.  HENT:  IVC drain in place  Eyes: EOM are normal.  Neck: Normal range of motion. Neck supple. No thyromegaly  present.  Cardiovascular: Normal rate and regular rhythm.   Respiratory: Effort normal and breath sounds normal. No respiratory distress.  GI: Soft. Bowel sounds are normal. She exhibits no distension.  Neurological: She is alert.  Mood is flat but appropriate. She makes good eye contact with examiner. She was able to state her name, age, date of birth in place. Moves all 4's. Decreased fine motor skills on left. LUE grossly 3+. LLE 3+ to 4. No sensory deficits.   Skin: Skin is warm and dry.  Psychiatric:  Easily frustrated    Results for orders placed during the hospital encounter of 07/27/13 (from the past 24 hour(s))  LIPID PANEL     Status: Abnormal   Collection Time    08/02/13  2:50 AM      Result Value Ref Range   Cholesterol 343 (*) 0 - 200 mg/dL   Triglycerides 97  <150 mg/dL   HDL 61  >39 mg/dL   Total CHOL/HDL Ratio 5.6     VLDL 19  0 - 40 mg/dL   LDL Cholesterol 263 (*) 0 - 99 mg/dL   No results found.  Assessment/Plan: Diagnosis: right thalamic hemorrhage with hydrocephalus 1. Does the need for close, 24 hr/day medical supervision in concert with the patient's rehab needs make it unreasonable for this patient to be served in a less intensive setting? Yes 2. Co-Morbidities requiring supervision/potential complications: htn 3. Due to bladder management, bowel management, safety, skin/wound care, disease management, medication administration, pain management and patient education, does the patient require 24 hr/day rehab nursing? Yes 4. Does the patient require coordinated care of a physician, rehab nurse, PT (1-2 hrs/day, 5 days/week), OT (1-2 hrs/day, 5 days/week) and SLP (1 hrs/day, 5 days/week) to address physical and functional deficits in the context of the above medical diagnosis(es)? Yes Addressing deficits in the following areas: balance, endurance, locomotion, strength, transferring, bowel/bladder control, bathing, dressing, feeding, grooming, toileting and  cognition 5. Can the patient actively participate in an intensive therapy program of at least 3 hrs of therapy per day at least 5 days per week? Yes 6. The potential for  patient to make measurable gains while on inpatient rehab is good 7. Anticipated functional outcomes upon discharge from inpatient rehab are supervision with PT, supervision with OT, supervision to mod I with SLP. 8. Estimated rehab length of stay to reach the above functional goals is: 12-14 days 9. Does the patient have adequate social supports to accommodate these discharge functional goals? Yes 10. Anticipated D/C setting: Home 11. Anticipated post D/C treatments: [place] therapy 12. Overall Rehab/Functional Prognosis: excellent  RECOMMENDATIONS: This patient's condition is appropriate for continued rehabilitative care in the following setting: CIR Patient has agreed to participate in recommended program. Yes Note that insurance prior authorization may be required for reimbursement for recommended care.  Comment: Will follow along for medical progress.   Meredith Staggers, MD, [place] [facility]     08/02/2013

## 2013-08-03 ENCOUNTER — Inpatient Hospital Stay (HOSPITAL_COMMUNITY): Payer: BC Managed Care – PPO

## 2013-08-03 ENCOUNTER — Encounter (HOSPITAL_COMMUNITY): Payer: Self-pay | Admitting: *Deleted

## 2013-08-03 NOTE — Progress Notes (Signed)
Stroke Team Progress Note  HISTORY Morgan Collins is an 58 y.o. female history hypertension, hyperlipidemia and hypothyroidism and poor compliance with taking prescribed medications presenting with complaint of headache on the morning of 07/27/2013 and sudden onset of weakness and numbness involving left side at 10 AM 07/27/2013. She had no previous history of stroke nor TIA. CT scan of her head showed right thalamic hemorrhage with extension into lateral and third ventricles with mass effect on the third ventricle and signs of early ventricle enlargement. NIH stroke score was 11. Patient became increasingly more lethargic in the emergency room. Repeat CT of her head showed a slight increase in size of her lateral ventricles indicative of early hydrocephalus. There was no significant increase in size of patient's thalamic hemorrhage.  Patient was not administerd TPA secondary to hemorrhage. She was admitted to the neuro ICU for further evaluation and treatment.  SUBJECTIVE Pt up in chair at bedside. No complaints this am.  OBJECTIVE Most recent Vital Signs: Filed Vitals:   08/03/13 0500 08/03/13 0600 08/03/13 0700 08/03/13 0800  BP: 103/68 97/75 94/71  100/64  Pulse: 71 70 75 65  Temp:      TempSrc:      Resp: 12 11 13 11   Height:      Weight:      SpO2: 92% 93% 95% 94%   CBG (last 3)  No results found for this basename: GLUCAP,  in the last 72 hours  IV Fluid Intake:      MEDICATIONS  . antiseptic oral rinse  15 mL Mouth Rinse BID  . atorvastatin  10 mg Oral q1800  . enoxaparin (LOVENOX) injection  40 mg Subcutaneous Q24H  . hydrochlorothiazide  12.5 mg Oral Daily  . influenza vac split quadrivalent PF  0.5 mL Intramuscular Tomorrow-1000  . lisinopril  20 mg Oral Daily  . pantoprazole  40 mg Oral QHS  . senna-docusate  1 tablet Oral BID  . sulfamethoxazole-trimethoprim  1 tablet Oral Q12H   PRN:  acetaminophen, acetaminophen, bisacodyl, labetalol, ondansetron (ZOFRAN) IV,  traMADol  Diet:  Cardiac thin liquids Activity:  As tolerated DVT Prophylaxis:  Lovenox 40 mg sq daily   CLINICALLY SIGNIFICANT STUDIES Basic Metabolic Panel:   Recent Labs Lab 07/27/13 1200 07/31/13 0912  NA 140 133*  K 4.7 4.8  CL 104 97  CO2 25 24  GLUCOSE 102* 108*  BUN 14 8  CREATININE 0.75 0.66  CALCIUM 10.5 11.0*   Liver Function Tests:   Recent Labs Lab 07/27/13 1200  AST 16  ALT 15  ALKPHOS 70  BILITOT <0.2*  PROT 7.8  ALBUMIN 3.7   CBC:   Recent Labs Lab 07/27/13 1121 07/27/13 1141 07/31/13 0912  WBC 6.6  --  12.0*  NEUTROABS 4.5  --  9.2*  HGB 12.3 15.0 13.4  HCT 38.5 44.0 41.8  MCV 85.9  --  86.7  PLT 244  --  292   Coagulation:   Recent Labs Lab 07/27/13 1121  LABPROT 12.2  INR 0.92   Cardiac Enzymes:   Recent Labs Lab 07/27/13 1200  TROPONINI <0.30   Urinalysis:   Recent Labs Lab 07/27/13 1333 07/29/13 0857  COLORURINE YELLOW YELLOW  LABSPEC 1.006 1.023  PHURINE 7.5 8.0  GLUCOSEU NEGATIVE NEGATIVE  HGBUR NEGATIVE LARGE*  BILIRUBINUR NEGATIVE NEGATIVE  KETONESUR NEGATIVE NEGATIVE  PROTEINUR NEGATIVE 100*  UROBILINOGEN 0.2 0.2  NITRITE NEGATIVE NEGATIVE  LEUKOCYTESUR NEGATIVE LARGE*   Lipid Panel     Component Value Date/Time  CHOL 343* 08/02/2013 0250   TRIG 97 08/02/2013 0250   HDL 61 08/02/2013 0250   CHOLHDL 5.6 08/02/2013 0250   VLDL 19 08/02/2013 0250   LDLCALC 263* 08/02/2013 0250    HgbA1C  No results found for this basename: HGBA1C    Urine Drug Screen:     Component Value Date/Time   LABOPIA NONE DETECTED 07/27/2013 1333   COCAINSCRNUR NONE DETECTED 07/27/2013 1333   LABBENZ NONE DETECTED 07/27/2013 1333   AMPHETMU NONE DETECTED 07/27/2013 1333   THCU NONE DETECTED 07/27/2013 1333   LABBARB NONE DETECTED 07/27/2013 1333    Alcohol Level:   Recent Labs Lab 07/27/13 1200  ETH <11    CT of the brain   07/30/2013 1. Slight interval increase in size of the lateral ventricles status post clamping of  the ventricular catheter as detailed above. . 2. Stable size and appearance of right thalamic hemorrhage with intraventricular extension. No new intracranial hemorrhage or other process identified.  3.Nonvisualization of previously identified tiny right parietal subdural hematoma. 07/29/2013 1. Stable size of right the lytic hemorrhage with intraventricular extension into the third ventricle with small amount of blood present within the cerebral aqueduct. Localized mass effect with slight right-to-left midline shift at the level of the hemorrhage is unchanged. 2. Interval placement of right frontal approach ventricular catheter with tip near the foramen of Monro. Overall ventricular size is decreased relative to prior exam from 07/27/2013 without CT evidence of obstructive hydrocephalus. 07/27/2013   Again noted right thalamic hemorrhage with extension into the third ventricle and some blood in anterior horn of the right lateral ventricle. Again noted blood in duct of Sylvius to the top of fourth ventricle. Mild increase in right to left mass effect axial image 14 measures about 5 mm. Again noted the obstructive hydrocephalus with minimal progression. No new focus of hemorrhage is noted.    07/27/2013   1. Acute hemorrhage in the right thalamus with extension into the third and right lateral ventricles and into the aqueduct of Sylvius. 2. Dilatation of the lateral ventricles consistent with obstructive hydrocephalus.  MRI of the brain  07/29/2013 Right mesial thalamic hematoma with intraventricular extension. No hydrocephalus, right frontal ventriculostomy catheter in situ. 1-2 mm right parietal subdural hematoma may be post procedural. Symmetric abnormal cerebellar signal abnormality with volume loss, in addition toleft occipital remote hemorrhage, constellation of findings may reflect sequelae of PRES, though are nonspecific. Scattered intracranial foci of susceptibility artifact/micro hemorrhages may reflect  chronic hypertension.   MRA of the brain 0/08/7046 Dolicoectatic appearance of the intracranial vessels suggest sequela of chronic hypertension. No hemodynamically significant luminal irregularity of the right M2/3 branches suggest intracranial atherosclerosis.   Physical Exam: HEENT- Normocephalic, s/p EVD placement Neck supple with no masses, nodes, nodules or enlargement.  Cardiovascular - regular rate and rhythm, S1, S2 normal, no murmur, click, rub or gallop  Lungs - chest clear, no wheezing, rales, normal symmetric air entry Abdomen - soft, non-tender; bowel sounds normal; no masses, no organomegaly  Extremities - no joint deformities, effusion, or inflammation, no edema and no skin discoloration  Neurologic Examination:  Mental Status:  She is awake alert She is oriented to person and place hospital and year. She follows commands well.  Cranial Nerves:  II-Visual fields were normal.  III/IV/VI-Pupils were equal and reacted. Extraocular movements were full and conjugate.  V/VII-no facial numbness and no facial weakness.  VIII-normal.  X-mild dysarthria.  Motor:  Right upper extremity 5; left upper extremity mild  pronator drift but actually has good strength on direct testing. She has antigravity 4-/5 strength of the legs. She has support stockings and the SCD. Sensory: Reduced perception of tactile sensation over left extremities   Deep Tendon Reflexes: 2+ and symmetric.  Plantars: Extensor on the left and flexor on the right    Therapy Recommendations inpatient rehabilitation    ASSESSMENT [name] is a 58 y.o. female presenting with new-onset headache and side weakness and numbness. Imaging confirms a right thalamic hemorrhage with intraventricular extension and cytotoxic cerebral edema, midline shift of 67mm, obstructive hydrocephalus. IVC placed by Dr. Joya Salm. CT remains stable without evidence of hydrocephalus. Hemorrhage most likely secondary to accelerated  hypertension.  On no antithrombotics prior to admission. Antithrombotics now held due to [place]. Patient with resultant left hemiparesis, headache, vertical gaze abnormality, dysarhtria.  Hypertension - home meds resumed - better today -  Labetalol PRN, prinvil 20, hctz 12.5 Hyperlipidemia, LDL 263, on no statin PTA, now on lipitor 10 mg daily  Hx depression Thyroid disease Headache, on ultram prn Constipation. Dulcolax suppository when necessary UTI - proteus mirabilis, bactrim D5  IVC clamped 2/13 with increase in ventricular size & temporal horns. Drain reopened with relief. Minimal drainage over night per RN.  Hospital day # 7  TREATMENT/PLAN  Dr. Joya Salm just clamped IVC. Would like to get CT this afternoon at 4p. CT ordered. Results to be called to [facility].  Continue SBP goal < 180.   Rehab consult in place for disposition once medically stable  SHARON BIBY, MSN, RN, ANVP-BC, ANP-BC, [facility] Pager: [phone] 08/03/2013 8:31 AM  I have personally obtained a history, examined the patient, evaluated imaging results, and formulated the assessment and plan of care. I agree with the above.  Antony Contras, MD

## 2013-08-03 NOTE — Progress Notes (Signed)
Physical Therapy Treatment Patient Details Name: Morgan Collins MRN: [id number] DOB: 1955/09/18 Today's Date: 08/03/2013 Time: 9476-5465 PT Time Calculation (min): 35 min  PT Assessment / Plan / Recommendation  History of Present Illness Rt thalamic hemorrhage with extension into lateral ventricles. Pt with IVC placed. NIH stroke score initially 11.   PT Comments   Pt with improved spirits today and ability to follow commands. Pt demo'd decreased impulsivity and responded well to assist for safety. Pt with improved ambulation tolerance but con't to have balance impairment and requires assist for safe transfers and ambulation. Pt con't to be appropriate for CIR upon d/c to achieve safe mod I function.   Follow Up Recommendations  CIR     Does the patient have the potential to tolerate intense rehabilitation     Barriers to Discharge        Equipment Recommendations       Recommendations for Other Services    Frequency Min 4X/week   Progress towards PT Goals Progress towards PT goals: Progressing toward goals  Plan Current plan remains appropriate    Precautions / Restrictions Precautions Precautions: Fall Precaution Comments: IVC drain clamped at this time Restrictions Weight Bearing Restrictions: No   Pertinent Vitals/Pain Denies pain    Mobility  Bed Mobility Overal bed mobility: Needs Assistance Bed Mobility: Supine to Sit Supine to sit: Min assist;HOB elevated General bed mobility comments: mina for trunk elevation, definate use of hands Transfers Overall transfer level: Needs assistance Equipment used: Rolling walker (2 wheeled) Transfers: Sit to/from Stand Sit to Stand: Min assist Stand pivot transfers: +2 safety/equipment;Min assist General transfer comment: pt assisted to [facility] Ambulation/Gait Ambulation/Gait assistance: Mod assist Ambulation Distance (Feet): 90 Feet Assistive device: Rolling walker (2 wheeled) Gait Pattern/deviations: Step-through  pattern;Decreased stride length;Shuffle Gait velocity: decreased General Gait Details: pt con't to "hug" the Left side requiring max tactile cues at hips to assist in appropriate weight shift throughout gait pattern Modified Rankin (Stroke Patients Only) Pre-Morbid Rankin Score: No symptoms Modified Rankin: Moderately severe disability    Exercises     PT Diagnosis:    PT Problem List:   PT Treatment Interventions:     PT Goals (current goals can now be found in the care plan section)    Visit Information  Last PT Received On: 08/03/13 Assistance Needed: +2 History of Present Illness: Rt thalamic hemorrhage with extension into lateral ventricles. Pt with IVC placed. NIH stroke score initially 11.    Subjective Data  Subjective: pt in pleasant mood this AM   Cognition  Cognition Arousal/Alertness: Awake/alert Behavior During Therapy: WFL for tasks assessed/performed Overall Cognitive Status: Impaired/Different from baseline Current Attention Level: Selective Memory: Decreased recall of precautions;Decreased short-term memory Safety/Judgement: Decreased awareness of safety;Decreased awareness of deficits Awareness: Emergent Problem Solving: Requires verbal cues;Requires tactile cues General Comments: pt with appropriate transfer technique this date and appropriately asked for assist    Balance  Balance Standing balance support: During functional activity Standing balance-Leahy Scale: Fair Standing balance comment: pt attempted to complete pericare s/p urination. pt requiring increased time and problem solving to achieve goal. Pt able to squat and reach with modA to steady self.  End of Session PT - End of Session Equipment Utilized During Treatment: Gait belt Activity Tolerance: Patient tolerated treatment well Patient left: in chair;with call bell/phone within reach Nurse Communication: Mobility status;Precautions   GP     Kingsley Callander 08/03/2013, 12:27  PM  Kittie Plater, PT, DPT Pager #: [phone] Office #:  [phone]   

## 2013-08-03 NOTE — Progress Notes (Signed)
Patient ID: Morgan Collins, female   DOB: 29-Jun-1955, 58 y.o.   MRN: [id number] Neuro stable. Ct head shows no incresw=e of ventricles. Will keep IVC clamped ti am. Spoke with her family

## 2013-08-03 NOTE — Progress Notes (Signed)
Patient ID: Morgan Collins, female   DOB: 05-03-1956, 58 y.o.   MRN: [id number] Stable, awake, minimal csf in the collection bag. Plan, clamp IVC and repeat ct head this pm

## 2013-08-03 NOTE — Progress Notes (Signed)
Occupational Therapy Treatment Patient Details Name: Morgan Collins MRN: [id number] DOB: 1955-09-16 Today's Date: 08/03/2013 Time: 1093-2355 OT Time Calculation (min): 32 min  OT Assessment / Plan / Recommendation  History of present illness Rt thalamic hemorrhage with extension into lateral ventricles. Pt with IVC placed. NIH stroke score initially 11.   OT comments  Addressing self feeding and family education. Pt with L inattention. "Clumsy L hand". Will further assess vision next session. Pt making progress. Cont to rec CIR for D/C.  Follow Up Recommendations  CIR;Supervision/Assistance - 24 hour    Barriers to Discharge       Equipment Recommendations  3 in 1 bedside comode;Tub/shower bench    Recommendations for Other Services Rehab consult  Frequency Min 3X/week   Progress towards OT Goals Progress towards OT goals: Progressing toward goals  Plan Discharge plan remains appropriate    Precautions / Restrictions Precautions Precautions: Fall Precaution Comments: IVC drain clamped at this time Restrictions Weight Bearing Restrictions: No   Pertinent Vitals/Pain IVC drain clamped during session Vitals stable.    ADL  Eating/Feeding: Supervision/safety Where Assessed - Eating/Feeding: Bed level ADL Comments: Pt seen for self feeding with family. Pt with apparent L inattention and depth perception deficits. Pt spontaneously using LUE. Misjudging distance with L hand. Increased time for problem solving and processing.     OT Diagnosis:    OT Problem List:   OT Treatment Interventions:     OT Goals(current goals can now be found in the care plan section) Acute Rehab OT Goals Patient Stated Goal: none stated today OT Goal Formulation: With patient Time For Goal Achievement: 08/13/13 Potential to Achieve Goals: Good ADL Goals Pt Will Perform Lower Body Bathing: with supervision;sit to/from stand Pt Will Perform Lower Body Dressing: with supervision;sit to/from  stand Pt Will Transfer to Toilet: with supervision;bedside commode Pt Will Perform Toileting - Clothing Manipulation and hygiene: with supervision;sit to/from stand Additional ADL Goal #1: Complete further visual assessment and establish appropriate goals  Visit Information  Last OT Received On: 08/03/13 Assistance Needed: +2 History of Present Illness: Rt thalamic hemorrhage with extension into lateral ventricles. Pt with IVC placed. NIH stroke score initially 11.    Subjective Data      Prior Functioning       Cognition  Cognition Arousal/Alertness: Awake/alert Behavior During Therapy: Flat affect Overall Cognitive Status: Impaired/Different from baseline Current Attention Level: Selective Memory: Decreased recall of precautions;Decreased short-term memory Following Commands: Follows one step commands consistently Safety/Judgement: Decreased awareness of safety;Decreased awareness of deficits Awareness: Emergent Problem Solving: Requires verbal cues;Requires tactile cues General Comments: pt with appropriate transfer technique this date and appropriately asked for assist    Mobility  Bed Mobility Overal bed mobility: Needs Assistance General bed mobility comments: pt scooting up in bed with min A for problem solving    Exercises  Other Exercises Other Exercises: Educated family on approaching pt from left Other Exercises: decreasing distractions during meal time Other Exercises: increasing funcitonal use LUE and BUE integration   Balance    End of Session OT - End of Session Activity Tolerance: Patient tolerated treatment well Patient left: with call bell/phone within reach;in bed;with family/visitor present Nurse Communication: Mobility status  GO     Viney Acocella,HILLARY 08/03/2013, 10:10 PM [facility], OTR/L  [phone] 08/03/2013

## 2013-08-04 ENCOUNTER — Inpatient Hospital Stay (HOSPITAL_COMMUNITY)
Admission: RE | Admit: 2013-08-04 | Discharge: 2013-08-11 | DRG: 945 | Disposition: A | Discharge status: Home / Self Care | Payer: BC Managed Care – PPO | Source: Intra-hospital | Attending: Physical Medicine & Rehabilitation | Admitting: Physical Medicine & Rehabilitation

## 2013-08-04 DIAGNOSIS — F329 Major depressive disorder, single episode, unspecified: Secondary | ICD-10-CM | POA: Diagnosis present

## 2013-08-04 DIAGNOSIS — E039 Hypothyroidism, unspecified: Secondary | ICD-10-CM | POA: Diagnosis present

## 2013-08-04 DIAGNOSIS — I10 Essential (primary) hypertension: Secondary | ICD-10-CM | POA: Diagnosis present

## 2013-08-04 DIAGNOSIS — N289 Disorder of kidney and ureter, unspecified: Secondary | ICD-10-CM | POA: Diagnosis not present

## 2013-08-04 DIAGNOSIS — I610 Nontraumatic intracerebral hemorrhage in hemisphere, subcortical: Secondary | ICD-10-CM

## 2013-08-04 DIAGNOSIS — G911 Obstructive hydrocephalus: Secondary | ICD-10-CM | POA: Diagnosis present

## 2013-08-04 DIAGNOSIS — N390 Urinary tract infection, site not specified: Secondary | ICD-10-CM

## 2013-08-04 DIAGNOSIS — E785 Hyperlipidemia, unspecified: Secondary | ICD-10-CM | POA: Diagnosis present

## 2013-08-04 DIAGNOSIS — F3289 Other specified depressive episodes: Secondary | ICD-10-CM | POA: Diagnosis present

## 2013-08-04 DIAGNOSIS — B964 Proteus (mirabilis) (morganii) as the cause of diseases classified elsewhere: Secondary | ICD-10-CM | POA: Diagnosis present

## 2013-08-04 DIAGNOSIS — I619 Nontraumatic intracerebral hemorrhage, unspecified: Secondary | ICD-10-CM | POA: Diagnosis present

## 2013-08-04 DIAGNOSIS — E7800 Pure hypercholesterolemia, unspecified: Secondary | ICD-10-CM | POA: Diagnosis present

## 2013-08-04 DIAGNOSIS — Z5189 Encounter for other specified aftercare: Principal | ICD-10-CM

## 2013-08-04 HISTORY — DX: Nontraumatic intracerebral hemorrhage in hemisphere, subcortical: I61.0

## 2013-08-04 LAB — CBC
HCT: 42.4 % (ref 36.0–46.0)
Hemoglobin: 13.7 g/dL (ref 12.0–15.0)
MCH: 27.6 pg (ref 26.0–34.0)
MCHC: 32.3 g/dL (ref 30.0–36.0)
MCV: 85.3 fL (ref 78.0–100.0)
Platelets: 346 10*3/uL (ref 150–400)
RBC: 4.97 MIL/uL (ref 3.87–5.11)
RDW: 15.2 % (ref 11.5–15.5)
WBC: 11.7 10*3/uL — ABNORMAL HIGH (ref 4.0–10.5)

## 2013-08-04 LAB — CREATININE, SERUM
Creatinine, Ser: 1.1 mg/dL (ref 0.50–1.10)
GFR calc Af Amer: 63 mL/min — ABNORMAL LOW (ref >90)
GFR calc non Af Amer: 55 mL/min — ABNORMAL LOW (ref >90)

## 2013-08-04 MED ORDER — SENNOSIDES-DOCUSATE SODIUM 8.6-50 MG PO TABS
1.0000 | ORAL_TABLET | Freq: Two times a day (BID) | ORAL | Status: DC
  Administered 2013-08-04 – 2013-08-11 (×13): 1 via ORAL
  Filled 2013-08-04 (×19): qty 1

## 2013-08-04 MED ORDER — ENOXAPARIN SODIUM 40 MG/0.4ML ~~LOC~~ SOLN
40.0000 mg | SUBCUTANEOUS | Status: DC
  Administered 2013-08-05 – 2013-08-11 (×7): 40 mg via SUBCUTANEOUS
  Filled 2013-08-04 (×7): qty 0.4

## 2013-08-04 MED ORDER — ONDANSETRON HCL 4 MG/2ML IJ SOLN: 4.0000 mg | Freq: Four times a day (QID) | INTRAMUSCULAR | Status: DC | PRN

## 2013-08-04 MED ORDER — ACETAMINOPHEN 650 MG RE SUPP
650.0000 mg | RECTAL | Status: DC | PRN
Start: 2013-08-04 — End: 2013-08-11

## 2013-08-04 MED ORDER — LEVOTHYROXINE SODIUM 75 MCG PO TABS
75.0000 ug | ORAL_TABLET | Freq: Every day | ORAL | Status: DC
  Administered 2013-08-05 – 2013-08-11 (×7): 75 ug via ORAL
  Filled 2013-08-04 (×8): qty 1

## 2013-08-04 MED ORDER — SULFAMETHOXAZOLE-TMP DS 800-160 MG PO TABS
1.0000 | ORAL_TABLET | Freq: Two times a day (BID) | ORAL | Status: DC
  Administered 2013-08-04 – 2013-08-08 (×9): 1 via ORAL
  Filled 2013-08-04 (×12): qty 1

## 2013-08-04 MED ORDER — ONDANSETRON HCL 4 MG PO TABS: 4.0000 mg | ORAL_TABLET | Freq: Four times a day (QID) | ORAL | Status: DC | PRN

## 2013-08-04 MED ORDER — ACETAMINOPHEN 325 MG PO TABS
650.0000 mg | ORAL_TABLET | ORAL | Status: DC | PRN
Start: 2013-08-04 — End: 2013-08-11
  Administered 2013-08-06 – 2013-08-10 (×11): 650 mg via ORAL
  Filled 2013-08-04 (×12): qty 2

## 2013-08-04 MED ORDER — BISACODYL 10 MG RE SUPP: 10.0000 mg | Freq: Every day | RECTAL | Status: DC | PRN

## 2013-08-04 MED ORDER — PANTOPRAZOLE SODIUM 40 MG PO TBEC
40.0000 mg | DELAYED_RELEASE_TABLET | Freq: Every day | ORAL | Status: DC
  Administered 2013-08-04 – 2013-08-10 (×7): 40 mg via ORAL
  Filled 2013-08-04 (×11): qty 1

## 2013-08-04 MED ORDER — ENOXAPARIN SODIUM 30 MG/0.3ML ~~LOC~~ SOLN: 30.0000 mg | SUBCUTANEOUS | Status: DC

## 2013-08-04 MED ORDER — ATORVASTATIN CALCIUM 10 MG PO TABS
10.0000 mg | ORAL_TABLET | Freq: Every day | ORAL | Status: DC
  Administered 2013-08-04 – 2013-08-10 (×7): 10 mg via ORAL
  Filled 2013-08-04 (×8): qty 1

## 2013-08-04 MED ORDER — TRAMADOL HCL 50 MG PO TABS
100.0000 mg | ORAL_TABLET | Freq: Four times a day (QID) | ORAL | Status: DC | PRN
  Administered 2013-08-08: 100 mg via ORAL
  Filled 2013-08-04 (×2): qty 2

## 2013-08-04 MED ORDER — SORBITOL 70 % SOLN
30.0000 mL | Freq: Every day | Status: DC | PRN
  Administered 2013-08-05: 30 mL via ORAL
  Filled 2013-08-04: qty 30

## 2013-08-04 NOTE — Discharge Summary (Signed)
Stroke Discharge Summary  Patient ID: Morgan Collins   MRN: [id number]      DOB: 05-21-1956  Date of Admission: 07/27/2013 Date of Discharge: 08/04/2013  Attending Physician:  Darcella Cheshire, MD, Stroke MD  Consulting Physician(s):  Treatment Team:  Karn Cassis, MD; Faith Rogue, MD (Physical Medicine & Rehabtilitation)  Discharge Diagnoses:  Principal Problem:   ICH (intracerebral hemorrhage) - right thalamic hemorrhage with intraventricular extension and cytotoxic cerebral edema, obstructive hydrocephalus, secondary to hypertension  Active Problems:   Accelerated hypertension   Hyperlipidemia,    Obstructive hydrocephalus   Hx depression    Headache secondary to hemorrhage   Constipation.    UTI - proteus mirabilis   Left hemiparesis, secondary to stroke morbid obesity, BMI  Body mass index is 47.17 kg/(m^2).   Past Medical History  Diagnosis Date  . Hypertension   . Thyroid disease   . High cholesterol   . Depressed    History reviewed. No pertinent past surgical history.  Medications to be continued on Rehab . atorvastatin  10 mg Oral q1800  . enoxaparin (LOVENOX) injection  40 mg Subcutaneous Q24H  . influenza vac split quadrivalent PF  0.5 mL Intramuscular Tomorrow-1000  . pantoprazole  40 mg Oral QHS  . senna-docusate  1 tablet Oral BID  . sulfamethoxazole-trimethoprim  1 tablet Oral Q12H    LABORATORY STUDIES CBC    Component Value Date/Time   WBC 12.0* 07/31/2013 0912   RBC 4.82 07/31/2013 0912   HGB 13.4 07/31/2013 0912   HCT 41.8 07/31/2013 0912   PLT 292 07/31/2013 0912   MCV 86.7 07/31/2013 0912   MCH 27.8 07/31/2013 0912   MCHC 32.1 07/31/2013 0912   RDW 15.0 07/31/2013 0912   LYMPHSABS 2.0 07/31/2013 0912   MONOABS 0.8 07/31/2013 0912   EOSABS 0.1 07/31/2013 0912   BASOSABS 0.0 07/31/2013 0912   CMP    Component Value Date/Time   NA 133* 07/31/2013 0912   K 4.8 07/31/2013 0912   CL 97 07/31/2013 0912   CO2 24 07/31/2013 0912   GLUCOSE  108* 07/31/2013 0912   BUN 8 07/31/2013 0912   CREATININE 0.66 07/31/2013 0912   CALCIUM 11.0* 07/31/2013 0912   PROT 7.8 07/27/2013 1200   ALBUMIN 3.7 07/27/2013 1200   AST 16 07/27/2013 1200   ALT 15 07/27/2013 1200   ALKPHOS 70 07/27/2013 1200   BILITOT <0.2* 07/27/2013 1200   GFRNONAA >90 07/31/2013 0912   GFRAA >90 07/31/2013 0912   COAGS Lab Results  Component Value Date   INR 0.92 07/27/2013   Lipid Panel    Component Value Date/Time   CHOL 343* 08/02/2013 0250   TRIG 97 08/02/2013 0250   HDL 61 08/02/2013 0250   CHOLHDL 5.6 08/02/2013 0250   VLDL 19 08/02/2013 0250   LDLCALC 263* 08/02/2013 0250   HgbA1C  No results found for this basename: HGBA1C   Cardiac Panel (last 3 results) No results found for this basename: CKTOTAL, CKMB, TROPONINI, RELINDX,  in the last 72 hours Urinalysis    Component Value Date/Time   COLORURINE YELLOW 07/29/2013 0857   APPEARANCEUR TURBID* 07/29/2013 0857   LABSPEC 1.023 07/29/2013 0857   PHURINE 8.0 07/29/2013 0857   GLUCOSEU NEGATIVE 07/29/2013 0857   HGBUR LARGE* 07/29/2013 0857   BILIRUBINUR NEGATIVE 07/29/2013 0857   KETONESUR NEGATIVE 07/29/2013 0857   PROTEINUR 100* 07/29/2013 0857   UROBILINOGEN 0.2 07/29/2013 0857   NITRITE NEGATIVE 07/29/2013 0857  LEUKOCYTESUR LARGE* 07/29/2013 0857   Urine Drug Screen     Component Value Date/Time   LABOPIA NONE DETECTED 07/27/2013 1333   COCAINSCRNUR NONE DETECTED 07/27/2013 1333   LABBENZ NONE DETECTED 07/27/2013 1333   AMPHETMU NONE DETECTED 07/27/2013 1333   THCU NONE DETECTED 07/27/2013 1333   LABBARB NONE DETECTED 07/27/2013 1333    Alcohol Level    Component Value Date/Time   ETH <11 07/27/2013 1200     SIGNIFICANT DIAGNOSTIC STUDIES CT of the brain  08/03/2013 1. Stable ventricle size and configuration. No significant ventriculomegaly. 2. Stable small volume right thalamic hemorrhage. No intraventricular extension of blood and no significant mass effect.  07/30/2013 1. Slight interval increase in  size of the lateral ventricles status post clamping of the ventricular catheter as detailed above. . 2. Stable size and appearance of right thalamic hemorrhage with intraventricular extension. No new intracranial hemorrhage or other process identified. 3.Nonvisualization of previously identified tiny right parietal subdural hematoma.  07/29/2013 1. Stable size of right the lytic hemorrhage with intraventricular extension into the third ventricle with small amount of blood present within the cerebral aqueduct. Localized mass effect with slight right-to-left midline shift at the level of the hemorrhage is unchanged. 2. Interval placement of right frontal approach ventricular catheter with tip near the foramen of Monro. Overall ventricular size is decreased relative to prior exam from 07/27/2013 without CT evidence of obstructive hydrocephalus.  07/27/2013 Again noted right thalamic hemorrhage with extension into the third ventricle and some blood in anterior horn of the right lateral ventricle. Again noted blood in duct of Sylvius to the top of fourth ventricle. Mild increase in right to left mass effect axial image 14 measures about 5 mm. Again noted the obstructive hydrocephalus with minimal progression. No new focus of hemorrhage is noted.  07/27/2013 1. Acute hemorrhage in the right thalamus with extension into the third and right lateral ventricles and into the aqueduct of Sylvius. 2. Dilatation of the lateral ventricles consistent with obstructive hydrocephalus.  MRI of the brain 07/29/2013 Right mesial thalamic hematoma with intraventricular extension. No hydrocephalus, right frontal ventriculostomy catheter in situ. 1-2 mm right parietal subdural hematoma may be post procedural. Symmetric abnormal cerebellar signal abnormality with volume loss, in addition toleft occipital remote hemorrhage, constellation of findings may reflect sequelae of PRES, though are nonspecific. Scattered intracranial foci of  susceptibility artifact/micro hemorrhages may reflect chronic hypertension.  MRA of the brain 1/69/6789 Dolicoectatic appearance of the intracranial vessels suggest sequela of chronic hypertension. No hemodynamically significant luminal irregularity of the right M2/3 branches suggest intracranial atherosclerosis.      History of Present Illness   Morgan Collins is an 58 y.o. female history hypertension, hyperlipidemia and hypothyroidism and poor compliance with taking prescribed medications presenting with complaint of headache on the morning of 07/27/2013 and sudden onset of weakness and numbness involving left side at 10 AM 07/27/2013. She had no previous history of stroke nor TIA. CT scan of her head showed right thalamic hemorrhage with extension into lateral and third ventricles with mass effect on the third ventricle and signs of early ventricle enlargement. NIH stroke score was 11. Patient became increasingly more lethargic in the emergency room. Repeat CT of her head showed a slight increase in size of her lateral ventricles indicative of early hydrocephalus. There was no significant increase in size of patient's thalamic hemorrhage.  Patient was not administerd TPA secondary to hemorrhage. She was admitted to the neuro ICU for further evaluation and treatment.  Hospital Course Imaging confirms a right thalamic hemorrhage with intraventricular extension and cytotoxic cerebral edema, midline shift of 12mm, obstructive hydrocephalus. Dr. Joya Salm, neurosurgeon consulted with IVC placement. Initially patient overtly confused, imrpoved during her monitored stay in NICU. Her IVC was clamped 2/13 with an increase in ventricular size & temporal horns. Drain reopened with relief. Reclamped 3 days later with no increase in ventricular size as CT stable without evidence of hydrocephalus. Hemorrhage most likely secondary to accelerated hypertension. On no antithrombotics prior to admission. Antithrombotics held  due to [place].   Patient with vascular risk factors of:  Hypertension - home meds resumed at half dose - after a day or so, BP dropped to the high 90-100s. BP med on hold at time of discharge. Needs monitoring Hyperlipidemia, LDL 263, on no statin PTA, placed on lipitor 10 mg daily   Patient also with: Hx depression  Thyroid disease  Headache secondary to hemorrhage, on ultram prn  Constipation. Dulcolax suppository when necessary  UTI - proteus mirabilis, treated with bactrim   Patient with resultant left hemiparesis, headache, vertical gaze abnormality, dysarhtria - all which have improved. She does have some memory deficits. . Physical therapy, occupational therapy and speech therapy evaluated patient. All agreed inpatient rehab is needed. Patient's family is/are supportive and can provide care at discharge. CIR bed is available today and patient will be transferred there.  Discharge Exam  Blood pressure 107/78, pulse 89, temperature 99.1 F (37.3 C), temperature source Oral, resp. rate 23, height 4\' 9"  (1.448 m), weight 98.9 kg (218 lb 0.6 oz), SpO2 94.00%.  Physical Exam:  HEENT- Normocephalic, s/p EVD placement  Neck supple with no masses, nodes, nodules or enlargement.  Cardiovascular - regular rate and rhythm, S1, S2 normal, no murmur, click, rub or gallop  Lungs - chest clear, no wheezing, rales, normal symmetric air entry  Abdomen - soft, non-tender; bowel sounds normal; no masses, no organomegaly  Extremities - no joint deformities, effusion, or inflammation, no edema and no skin discoloration  Neurologic Examination:  Mental Status:  She is awake alert She is oriented to person and place hospital and year. She follows commands well.  Cranial Nerves:  II-Visual fields were normal.  III/IV/VI-Pupils were equal and reacted. Extraocular movements were full and conjugate.  V/VII-no facial numbness and no facial weakness.  VIII-normal.  X-mild dysarthria.  Motor: Right upper  extremity 5; left upper extremity mild pronator drift but actually has good strength on direct testing. She has antigravity 4-/5 strength of the legs. She has support stockings and the SCD.  Sensory: Reduced perception of tactile sensation over left extremities  Deep Tendon Reflexes: 2+ and symmetric.  Plantars: Extensor on the left and flexor on the right    Discharge Diet  Cardiac thin liquids  Discharge Plan  Disposition:  Transfer to [facility] for ongoing PT, OT and ST Due to hemorrhage and risk of bleeding, do not take aspirin, aspirin-containing medications, or ibuprofen products. Monitor BP. Resume home meds as appropriate on rehab.  Recommend ongoing risk factor control by Primary Care Physician at time of discharge from inpatient rehabilitation.  Follow-up Primary MD in 1 month following discharge from rehab. Get one if you do not have.  Follow-up with Dr. Antony Contras, Stroke Clinic in 2 months.  30 minutes were spent preparing discharge.  Signed Burnetta Sabin, AVNP, ANP-BC, [facility] Nurse Practitioner 08/04/2013, 2:34 PM  I have personally examined this patient, reviewed pertinent data and developed the plan  of care. I agree with above. Antony Contras, MD

## 2013-08-04 NOTE — Progress Notes (Signed)
I have insurance approval to admit pt to inpt rehab today. I have discussed with Burnetta Sabin, [facility] and RN at bedside that we await discontinuing of IVC before admitting pt. Await Dr. Joya Salm to followup today. I have discussed with pt , her brother and her daughter. They are all aware and in agreement. [phone]

## 2013-08-04 NOTE — PMR Pre-admission (Signed)
PMR Admission Coordinator Pre-Admission Assessment  Patient: Morgan Collins is an 58 y.o., female MRN: [id number] DOB: 11/14/1955 Height: 4\' 9"  (144.8 cm) Weight: 98.9 kg (218 lb 0.6 oz)              Insurance Information HMO:     PPO: yes     PCP:      IPA:      80/20:      OTHER:  PRIMARY: BCBS of       Policy#: [id number]      Subscriber: pt CM Name: Ulyses Southward      Phone#: [phone]     Fax#: [phone] auth until 12/20/21 Pre-Cert#: [id number]      Employer: Obamacare Benefits:  Phone #: [phone]     Name: 08/04/13 Eff. Date: 06/17/13     Deduct: $5000      Out of Pocket Max: [id number] includes deductible      Life Max: none CIR: 80%      SNF: 80% Outpatient: 80%     Co-Pay: 20% 30 visits combined Home Health: 80%      Co-Pay: 30 visits combined DME: 80%     Co-Pay: 20% Providers: in network  SECONDARY: none       Medicaid Application Date:       Case Manager:  Disability Application Date: dtr requesting disability and medicaid apps. I instructed dtr to go to [employer] adm      Case Worker:   Emergency Facilities manager Information   Name Relation Home Work Mobile   Kreuzer,Jocelyn Daughter [phone]    Javier Docker   [phone]   Caoilainn, Sacks  [phone]     [name]  [phone]       Current Medical History  Patient Admitting Diagnosis: right thalamic hemorrhage with hydrocephalus  History of Present Illness:Morgan Collins is a 58 y.o. right-handed female with history of hypertension. Admitted 07/27/2013 with acute onset of headache and left-sided weakness. Cranial CT scan showed right thalamic hemorrhage with extension into lateral and third ventricles with mass effect on the third ventricle and signs of early ventricle enlargement indicative of early hydrocephalus. Underwent insertion of right frontal ventricular catheter for CSF drain 07/28/2013 per Dr. Joya Salm. Followup MRI of the brain  07/29/2013 showed no signs of hydrocephalus. Hospital course Proteus urinary tract infection placed on Bactrim. Tolerating a regular consistency diet. IVC drain removed 08/04/13  Total: 1 NIH    Past Medical History  Past Medical History  Diagnosis Date  . Hypertension   . Thyroid disease   . High cholesterol   . Depressed     Family History  family history is not on file.  Prior Rehab/Hospitalizations: none   Current Medications  Current facility-administered medications:acetaminophen (TYLENOL) suppository 650 mg, 650 mg, Rectal, Q4H PRN, Wallie Char;  acetaminophen (TYLENOL) tablet 650 mg, 650 mg, Oral, Q4H PRN, Wallie Char, 650 mg at 08/04/13 0049;  atorvastatin (LIPITOR) tablet 10 mg, 10 mg, Oral, q1800, Donzetta Starch, NP, 10 mg at 08/03/13 1840;  bisacodyl (DULCOLAX) suppository 10 mg, 10 mg, Rectal, Daily PRN, Phillips Odor, MD, 10 mg at 08/01/13 1500 enoxaparin (LOVENOX) injection 40 mg, 40 mg, Subcutaneous, Q24H, Donzetta Starch, NP, 40 mg at 08/04/13 1317;  influenza vac split quadrivalent PF (FLUARIX) injection 0.5 mL, 0.5 mL, Intramuscular, Tomorrow-1000, Garvin Fila, MD;  labetalol (NORMODYNE,TRANDATE) injection 10-40 mg, 10-40 mg, Intravenous, Q10 min PRN, Hulen Luster, DO, 20 mg at 07/31/13 0920 ondansetron [facility])  injection 4 mg, 4 mg, Intravenous, Q6H PRN, Dorian Pod, MD, 4 mg at 07/31/13 0908;  pantoprazole (PROTONIX) EC tablet 40 mg, 40 mg, Oral, QHS, Garvin Fila, MD, 40 mg at 08/03/13 2108;  senna-docusate (Senokot-S) tablet 1 tablet, 1 tablet, Oral, BID, Wallie Char, 1 tablet at 08/04/13 1117 sulfamethoxazole-trimethoprim (BACTRIM DS) 800-160 MG per tablet 1 tablet, 1 tablet, Oral, Q12H, Hulen Luster, DO, 1 tablet at 08/04/13 1117;  traMADol (ULTRAM) tablet 100 mg, 100 mg, Oral, Q6H PRN, Donzetta Starch, NP, 100 mg at 08/04/13 1417  Patients Current Diet: Cardiac  Precautions / Restrictions Precautions Precautions: Fall Precaution  Comments: IVC drain clamped at this time Restrictions Weight Bearing Restrictions: No   Prior Activity Level Community (5-7x/wk): 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 active 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 Devices/Equipment: None Home Equipment: None  Prior Functional Level Prior Function Level of Independence: Independent  Current Functional Level Cognition  Arousal/Alertness: Awake/alert Overall Cognitive Status: Impaired/Different from baseline Current Attention Level: Selective Orientation Level: Oriented X4 Following Commands: Follows one step commands consistently Safety/Judgement: Decreased awareness of safety;Decreased awareness of deficits General Comments: pt with appropriate transfer technique this date and appropriately asked for assist Attention: Sustained Sustained Attention: Impaired Sustained Attention Impairment: Verbal basic Memory: Impaired Memory Impairment: Decreased recall of new information;Decreased short term memory;Retrieval deficit Decreased Short Term Memory: Verbal basic;Functional basic Awareness: Impaired Awareness Impairment: Intellectual impairment;Emergent impairment;Anticipatory impairment Problem Solving: Impaired Problem Solving Impairment: Verbal basic;Functional basic Executive Function: Self Monitoring;Self Correcting;Decision Making Behaviors: Poor frustration tolerance Safety/Judgment: Impaired    Extremity Assessment (includes Sensation/Coordination)          ADLs  Eating/Feeding: Supervision/safety Where Assessed - Eating/Feeding: Bed level Grooming: Supervision/safety;Set up Where Assessed - Grooming: Supported sitting Upper Body Bathing: Minimal assistance Where Assessed - Upper Body Bathing: Supported sitting Lower Body Bathing: Moderate assistance Where Assessed - Lower Body Bathing: Supported sit to stand Upper Body Dressing: Minimal assistance Where Assessed - Upper Body Dressing: Supported sitting Lower Body  Dressing: Maximal assistance Where Assessed - Lower Body Dressing: Supported sit to Lobbyist: Moderate assistance Transfers/Ambulation Related to ADLs: mod A ADL Comments: Pt seen for self feeding with family. Pt with apparent L inattention and depth perception deficits. Pt spontaneously using LUE. Misjudging distance with L hand. Increased time for problem solving and processing.     Mobility  Overal bed mobility: Needs Assistance Bed Mobility: Supine to Sit Supine to sit: Min assist;HOB elevated General bed mobility comments: pt scooting up in bed with min A for problem solving    Transfers  Overall transfer level: Needs assistance Equipment used: Rolling walker (2 wheeled) Transfers: Sit to/from Stand Sit to Stand: Min assist Stand pivot transfers: +2 safety/equipment;Min assist General transfer comment: pt assisted to [place] / Gait / Stairs / Pharmacist, hospital (Feet): 90 Feet Gait velocity: decreased General Gait Details: pt con't to "hug" the Left side requiring max tactile cues at hips to assist in appropriate weight shift throughout gait pattern    Posture / Balance Dynamic Sitting Balance Sitting balance - Comments: sat EOB ~5 min     Special needs/care consideration Bowel mgmt: continent Bladder mgmt: continent    Previous Home Environment Living Arrangements: Alone  Lives With: Alone Available Help at Discharge: Family;Available 24 hours/day;Other (Comment) (to go stay with her brother, Eustace Moore) Type of Home: Apartment Home Layout: Two level;Bed/bath upstairs Alternate Level Stairs-Rails: Left Alternate Level Stairs-Number of Steps: 12 Home  Access: Stairs to enter Entrance Stairs-Rails: Left;Right;Can reach both Technical brewer of Steps: 5 Bathroom Shower/Tub: Optometrist: Yes How Accessible: Accessible via walker Home Care Services:  No  Discharge Living Setting Plans for Discharge Living Setting: Other (Comment) (to go stay with her brother, Eustace Moore) Type of Home at Discharge: House Discharge Home Layout: One level Discharge Home Access: Stairs to enter Discharge Bathroom Shower/Tub: Walk-in shower Discharge Bathroom Toilet: Standard Discharge Bathroom Accessibility: Yes How Accessible: Accessible via walker Does the patient have any problems obtaining your medications?: No  Social/Family/Support Systems 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;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) Contact Information: Jocelyn and Eustace Moore; daughter and brother Anticipated Caregiver: family Anticipated Caregiver's Contact Information: see above Ability/Limitations of Caregiver: family to take shifts to provide 24/7 supervision initally at brother's home Caregiver Availability: 24/7 Discharge Plan Discussed with Primary Caregiver: Yes Is Caregiver In Agreement with Plan?: Yes Does Caregiver/Family have Issues with Lodging/Transportation while Pt is in Rehab?: No  Goals/Additional Needs Patient/Family Goal for Rehab: supervision with PT, OT, and SLP Expected length of stay: ELOS 12 to 14 days Pt/Family Agrees to Admission and willing to participate: Yes Program Orientation Provided & Reviewed with Pt/Caregiver Including Roles  & Responsibilities: Yes  Decrease burden of Care through IP rehab admission: n/a  Possible need for SNF placement upon discharge:no  Patient Condition: This patient's medical and functional status has changed since the consult dated: 08/01/13 in which the Rehabilitation Physician determined and documented that the patient's condition is appropriate for intensive rehabilitative care in an inpatient rehabilitation facility. See "History of Present Illness" (above) for medical update. Functional changes are: /. Patient's medical and functional status update has been discussed with the Rehabilitation physician and patient remains appropriate  for inpatient rehabilitation. Will admit to inpatient rehab today.  Preadmission Screen Completed By:  Cleatrice Burke, 08/04/2013 2:27 PM ______________________________________________________________________   Discussed status with Dr. Naaman Plummer on 08/04/13 at  1427 and received telephone approval for admission today.  Admission Coordinator:  Cleatrice Burke, time [id number] Date 08/04/13.

## 2013-08-04 NOTE — Interval H&P Note (Signed)
Morgan Collins was admitted today to Inpatient Rehabilitation with the diagnosis of  Right thalamic hemorrhage.  The patient's history has been reviewed, patient examined, and there is no change in status.  Patient continues to be appropriate for intensive inpatient rehabilitation.  I have reviewed the patient's chart and labs.  Questions were answered to the patient's satisfaction.  Jaira Canady T 08/04/2013, 8:17 PM

## 2013-08-04 NOTE — Progress Notes (Signed)
Patient ID: Morgan Collins, female   DOB: 1956/06/04, 58 y.o.   MRN: [id number] Alert,oriented. IVC removed. To rehabilitation today. Staples applied to the drain site

## 2013-08-04 NOTE — Progress Notes (Signed)
Physical Therapy Treatment Patient Details Name: AMNAH BREUER MRN: [id number] DOB: 27-Oct-1955 Today's Date: 08/04/2013 Time: 4259-5638 PT Time Calculation (min): 25 min  PT Assessment / Plan / Recommendation  History of Present Illness Rt thalamic hemorrhage with extension into lateral ventricles. Pt with IVC placed. NIH stroke score initially 11.   PT Comments   Pt continues to demo gt abnormalities and balance deficits with mobility. Pt also demos decreased safety awareness throughout session. Pt seems unaware that she had LOB x2 during ambulation. Pt continues to be great candidate for CIR to increase PLOF prior to returning home with family.   Follow Up Recommendations  CIR     Does the patient have the potential to tolerate intense rehabilitation     Barriers to Discharge        Equipment Recommendations  Other (comment)    Recommendations for Other Services OT consult;Rehab consult  Frequency Min 4X/week   Progress towards PT Goals Progress towards PT goals: Progressing toward goals  Plan Current plan remains appropriate    Precautions / Restrictions Precautions Precautions: Fall Precaution Comments: IVC drain clamped at this time Restrictions Weight Bearing Restrictions: No   Pertinent Vitals/Pain Stable t/o session     Mobility  Bed Mobility Overal bed mobility: Needs Assistance Bed Mobility: Supine to Sit;Sit to Supine Supine to sit: Min assist;HOB elevated Sit to supine: Min assist General bed mobility comments: pt required HOB to be elevated; cues for sequencing and hand placement; required (A) to elevate trunk to sitting position and then when returned to supine required (A) to bring LEs into bed  Transfers Overall transfer level: Needs assistance Equipment used: Rolling walker (2 wheeled) Transfers: Sit to/from Stand Sit to Stand: Mod assist General transfer comment: pt continues to demo decr safety awareness with transfers; cues for hand placement  and safety; pt with left lateral lean bias; pt has increased difficulty managing RW ? due to visual deficits ?   Ambulation/Gait Ambulation/Gait assistance: Mod assist Ambulation Distance (Feet): 70 Feet Assistive device: Rolling walker (2 wheeled) Gait Pattern/deviations: Step-through pattern;Decreased stride length;Staggering left;Wide base of support Gait velocity: decreased Gait velocity interpretation: Below normal speed for age/gender General Gait Details: pt attempts to incr gt speed but is unsteady to at this time; pt with 2 LOB to Left and posterior, requries (A) to maintain balance; becomes defensive when being assisted; cues for safety throughout session  Modified Rankin (Stroke Patients Only) Pre-Morbid Rankin Score: No symptoms Modified Rankin: Moderately severe disability         PT Diagnosis:    PT Problem List:   PT Treatment Interventions:     PT Goals (current goals can now be found in the care plan section) Acute Rehab PT Goals Patient Stated Goal: to get this thing out of my head  PT Goal Formulation: With patient Time For Goal Achievement: 08/12/13 Potential to Achieve Goals: Good  Visit Information  Last PT Received On: 08/04/13 Assistance Needed: +2 (safety) History of Present Illness: Rt thalamic hemorrhage with extension into lateral ventricles. Pt with IVC placed. NIH stroke score initially 11.    Subjective Data  Subjective: pt initially reluctant to ambulate; agreeable if she could return to bed and watch TV after  Patient Stated Goal: to get this thing out of my head    Cognition  Cognition Arousal/Alertness: Awake/alert Behavior During Therapy: Flat affect Overall Cognitive Status: Impaired/Different from baseline Area of Impairment: Attention;Following commands;Awareness;Safety/judgement;Problem solving Current Attention Level: Selective Memory: Decreased recall of precautions;Decreased  short-term memory Following Commands: Follows one step  commands consistently Safety/Judgement: Decreased awareness of safety;Decreased awareness of deficits Awareness: Emergent Problem Solving: Requires verbal cues;Requires tactile cues General Comments: pt continues to deflect from deficits     Balance  Balance Overall balance assessment: Needs assistance Sitting-balance support: Feet supported;No upper extremity supported Sitting balance-Leahy Scale: Fair Sitting balance - Comments: Sat EOB ~5 min to assess BP; BP stable and no dizziniess reported Postural control: Posterior lean;Left lateral lean Standing balance support: Bilateral upper extremity supported;During functional activity Standing balance-Leahy Scale: Fair Standing balance comment: bil UE supported by RW   End of Session PT - End of Session Equipment Utilized During Treatment: Gait belt Activity Tolerance: Patient tolerated treatment well Patient left: in bed;with call bell/phone within reach Nurse Communication: Mobility status;Precautions   GP     Gustavus Bryant, [place] 08/04/2013, 4:33 PM

## 2013-08-04 NOTE — H&P (View-Only) (Signed)
Physical Medicine and Rehabilitation Admission H&P    Chief Complaint  Patient presents with  .  headache   :  Chief complaint: Headache  HPI: Morgan Collins is a 58 y.o. right-handed female with history of hypertension. Admitted 07/27/2013 with acute onset of headache and left-sided weakness. Cranial CT scan showed right thalamic hemorrhage with extension into lateral and third ventricles with mass effect on the third ventricle and signs of early ventricle enlargement indicative of early hydrocephalus. Underwent insertion of right frontal ventricular catheter for CSF drain 07/28/2013 per Dr. Joya Salm. Followup MRI of the brain 07/29/2013 showed no signs of hydrocephalus. Her IVC drain was removed 08/04/2013. Hospital course Proteus urinary tract infection placed on Bactrim 07/30/2013. Subcutaneous Lovenox was added for DVT prophylaxis 08/02/2013 Tolerating a regular consistency diet. Physical and occupational therapy evaluations completed 07/29/2013 with recommendations of physical medicine rehabilitation consult   ROS Review of Systems  Neurological: Positive for dizziness, weakness and headaches.  All other systems reviewed and are negative  Past Medical History  Diagnosis Date  . Hypertension   . Thyroid disease   . High cholesterol   . Depressed    History reviewed. No pertinent past surgical history. No family history on file. Social History:  reports that she has never smoked. She does not have any smokeless tobacco history on file. She reports that she does not drink alcohol. Her drug history is not on file. Allergies: No Known Allergies Medications Prior to Admission  Medication Sig Dispense Refill  . Fexofenadine HCl (MUCINEX ALLERGY PO) Take 7.5 mLs by mouth at bedtime as needed (allergies).      Marland Kitchen levothyroxine (SYNTHROID, LEVOTHROID) 75 MCG tablet Take 75 mcg by mouth daily before breakfast.      . lisinopril-hydrochlorothiazide (PRINZIDE,ZESTORETIC) 20-12.5 MG per  tablet Take 1 tablet by mouth 2 (two) times daily.      Marland Kitchen NAPROXEN PO Take 1 tablet by mouth 2 (two) times daily as needed (pain).      Marland Kitchen albuterol (PROVENTIL HFA;VENTOLIN HFA) 108 (90 BASE) MCG/ACT inhaler Inhale 1-2 puffs into the lungs every 6 (six) hours as needed for wheezing or shortness of breath.  1 Inhaler  0  . azithromycin (ZITHROMAX) 250 MG tablet Take 1 tablet (250 mg total) by mouth daily. Take first 2 tablets together, then 1 every day until finished.  6 tablet  0    Home: Home Living Family/patient expects to be discharged to:: Private residence Living Arrangements: Alone Available Help at Discharge: Family;Available 24 hours/day Type of Home: Other(Comment) Home Access: Stairs to enter Entrance Stairs-Number of Steps: 5 Entrance Stairs-Rails: Left;Right;Can reach both Home Layout: Two level;Bed/bath upstairs Alternate Level Stairs-Number of Steps: 12 Alternate Level Stairs-Rails: Left Home Equipment: None   Functional History: Prior Function Vocation: Full time employment  Functional Status:  Mobility:     Ambulation/Gait Ambulation Distance (Feet): 90 Feet Gait velocity: decreased General Gait Details: pt con't to "hug" the Left side requiring max tactile cues at hips to assist in appropriate weight shift throughout gait pattern    ADL: ADL Eating/Feeding: Supervision/safety Where Assessed - Eating/Feeding: Bed level Grooming: Supervision/safety;Set up Where Assessed - Grooming: Supported sitting Upper Body Bathing: Minimal assistance Where Assessed - Upper Body Bathing: Supported sitting Lower Body Bathing: Moderate assistance Where Assessed - Lower Body Bathing: Supported sit to stand Upper Body Dressing: Minimal assistance Where Assessed - Upper Body Dressing: Supported sitting Lower Body Dressing: Maximal assistance Where Assessed - Lower Body Dressing: Supported sit to  stand Toilet Transfer: Moderate assistance Transfers/Ambulation Related to  ADLs: mod A ADL Comments: Pt seen for self feeding with family. Pt with apparent L inattention and depth perception deficits. Pt spontaneously using LUE. Misjudging distance with L hand. Increased time for problem solving and processing.   Cognition: Cognition Overall Cognitive Status: Impaired/Different from baseline Arousal/Alertness: Awake/alert Orientation Level: Oriented X4 Attention: Sustained Sustained Attention: Impaired Sustained Attention Impairment: Verbal basic Memory: Impaired Memory Impairment: Decreased recall of new information;Decreased short term memory;Retrieval deficit Decreased Short Term Memory: Verbal basic;Functional basic Awareness: Impaired Awareness Impairment: Intellectual impairment;Emergent impairment;Anticipatory impairment Problem Solving: Impaired Problem Solving Impairment: Verbal basic;Functional basic Executive Function: Self Monitoring;Self Correcting;Decision Making Behaviors: Poor frustration tolerance Safety/Judgment: Impaired Cognition Arousal/Alertness: Awake/alert Behavior During Therapy: Flat affect Overall Cognitive Status: Impaired/Different from baseline Area of Impairment: Attention;Following commands;Awareness;Safety/judgement;Problem solving Orientation Level: Disoriented to;Place;Situation Current Attention Level: Selective Memory: Decreased recall of precautions;Decreased short-term memory Following Commands: Follows one step commands consistently Safety/Judgement: Decreased awareness of safety;Decreased awareness of deficits Awareness: Emergent Problem Solving: Requires verbal cues;Requires tactile cues General Comments: pt with appropriate transfer technique this date and appropriately asked for assist   Blood pressure 102/69, pulse 78, temperature 98.2 F (36.8 C), temperature source Oral, resp. rate 18, height 4\' 9"  (1.448 m), weight 98.9 kg (218 lb 0.6 oz), SpO2 96.00%. Physical Exam Constitutional: She appears  well-developed.  HENT:  IVC drain site is clean and dry, staples in place, a little tender Eyes: EOM are normal.  Neck: Normal range of motion. Neck supple. No thyromegaly present.  Cardiovascular: Normal rate and regular rhythm.  Respiratory: Effort normal and breath sounds normal. No respiratory distress.  GI: Soft. Bowel sounds are normal. She exhibits no distension.  Neurological: She is alert.  Mood is flat but appropriate. She makes good eye contact with examiner. She was able to state her name, age, date of birth in place. Very alert.  Moves all 4's. Strength 5/5 grossly RUE and RLE and 4 to 4+ left bicep, tricep, deltoid, wrist, hand-- 4 to 4+ left HF, KE, ankle.  Decreased fine motor skills on left.  Appears to have intact pain and light touch.  Skin: Skin is warm and dry.  Psychiatric:  In good spirits. Smiles on occasions. Very cooperative  No results found for this or any previous visit (from the past 23 hour(s)). Ct Head Wo Contrast  08/03/2013   CLINICAL DATA:  58 year old female with recent thalamic hemorrhage. Evaluate status post EVD clamped for most of today. Headache. Initial encounter.  EXAM: CT HEAD WITHOUT CONTRAST  TECHNIQUE: Contiguous axial images were obtained from the base of the skull through the vertex without intravenous contrast.  COMPARISON:  07/30/2013 and earlier.  FINDINGS: Stable visualized osseous structures. Visualized paranasal sinuses and mastoids are clear. Stable orbit and scalp soft tissues.  Stable course of the ventricular drain, terminating at or near the anterior third ventricle. Resolved small volume hemorrhage along the tract.  Stable size and configuration of hyperdense blood products centered at the right thalamus (22 x 14 mm versus 25 x 12 mm previously). No intraventricular hemorrhage identified. Stable ventricle size and configuration, without significant ventriculomegaly.  Stable basilar cisterns. Stable gray-white matter differentiation  elsewhere in the brain. No new intracranial hemorrhage. No new cortically based infarct.  IMPRESSION: 1. Stable ventricle size and configuration. No significant ventriculomegaly. 2. Stable small volume right thalamic hemorrhage. No intraventricular extension of blood and no significant mass effect.   Electronically Signed   By: Lezlie Octave.D.  On: 08/03/2013 16:53    Post Admission Physician Evaluation: 1. Functional deficits secondary  to right thalamic hemorrhage with hydrocephalus s/p IVC. 2. Patient is admitted to receive collaborative, interdisciplinary care between the physiatrist, rehab nursing staff, and therapy team. 3. Patient's level of medical complexity and substantial therapy needs in context of that medical necessity cannot be provided at a lesser intensity of care such as a SNF. 4. Patient has experienced substantial functional loss from his/her baseline which was documented above under the "Functional History" and "Functional Status" headings.  Judging by the patient's diagnosis, physical exam, and functional history, the patient has potential for functional progress which will result in measurable gains while on inpatient rehab.  These gains will be of substantial and practical use upon discharge  in facilitating mobility and self-care at the household level. 5. Physiatrist will provide 24 hour management of medical needs as well as oversight of the therapy plan/treatment and provide guidance as appropriate regarding the interaction of the two. 6. 24 hour rehab nursing will assist with bladder management, bowel management, safety, skin/wound care, disease management, medication administration, pain management and patient education  and help integrate therapy concepts, techniques,education, etc. 7. PT will assess and treat for/with: Lower extremity strength, range of motion, stamina, balance, functional mobility, safety, adaptive techniques and equipment, NMR, cognitive awareness,  education.   Goals are: mod I to supervision. 8. OT will assess and treat for/with: ADL's, functional mobility, safety, upper extremity strength, adaptive techniques and equipment, NMR, cognitive awareness, education.   Goals are: mod I to supervision. 9. SLP will assess and treat for/with: speech, cognition, communication.  Goals are: mod I to supervision. 10. Case Management and Social Worker will assess and treat for psychological issues and discharge planning. 11. Team conference will be held weekly to assess progress toward goals and to determine barriers to discharge. 12. Patient will receive at least 3 hours of therapy per day at least 5 days per week. 13. ELOS: 10-14       14. Prognosis:  excellent   Medical Problem List and Plan: 1. Right thalamic hemorrhage with hydrocephalus. IVC   removed 08/04/2013 2. DVT Prophylaxis/Anticoagulation: Subcutaneous Lovenox for DVT prophylaxis initiated 08/02/2013. Monitor for any bleeding episodes 3. Pain Management: Ultram as needed. 4. Neuropsych: This patient is not yet capable of making decisions on her own behalf. 5. Hyperlipidemia. Lipitor 6. Proteus urinary tract infection. Bactrim initiated 07/30/2013 7. Hypothyroidism. Resume Synthroid as prior to admission. 8. Hypertension. No current antihypertensive meds. Patient on Prinizide 1 tab twice daily prior to admission. Will resume as needed  Meredith Staggers, MD, [facility]  08/04/2013

## 2013-08-04 NOTE — H&P (Signed)
Physical Medicine and Rehabilitation Admission H&P    Chief Complaint  Patient presents with  .  headache   :  Chief complaint: Headache  HPI: Morgan Collins is a 58 y.o. right-handed female with history of hypertension. Admitted 07/27/2013 with acute onset of headache and left-sided weakness. Cranial CT scan showed right thalamic hemorrhage with extension into lateral and third ventricles with mass effect on the third ventricle and signs of early ventricle enlargement indicative of early hydrocephalus. Underwent insertion of right frontal ventricular catheter for CSF drain 07/28/2013 per Dr. Joya Salm. Followup MRI of the brain 07/29/2013 showed no signs of hydrocephalus. Her IVC drain was removed 08/04/2013. Hospital course Proteus urinary tract infection placed on Bactrim 07/30/2013. Subcutaneous Lovenox was added for DVT prophylaxis 08/02/2013 Tolerating a regular consistency diet. Physical and occupational therapy evaluations completed 07/29/2013 with recommendations of physical medicine rehabilitation consult   ROS Review of Systems  Neurological: Positive for dizziness, weakness and headaches.  All other systems reviewed and are negative  Past Medical History  Diagnosis Date  . Hypertension   . Thyroid disease   . High cholesterol   . Depressed    History reviewed. No pertinent past surgical history. No family history on file. Social History:  reports that she has never smoked. She does not have any smokeless tobacco history on file. She reports that she does not drink alcohol. Her drug history is not on file. Allergies: No Known Allergies Medications Prior to Admission  Medication Sig Dispense Refill  . Fexofenadine HCl (MUCINEX ALLERGY PO) Take 7.5 mLs by mouth at bedtime as needed (allergies).      Marland Kitchen levothyroxine (SYNTHROID, LEVOTHROID) 75 MCG tablet Take 75 mcg by mouth daily before breakfast.      . lisinopril-hydrochlorothiazide (PRINZIDE,ZESTORETIC) 20-12.5 MG per  tablet Take 1 tablet by mouth 2 (two) times daily.      Marland Kitchen NAPROXEN PO Take 1 tablet by mouth 2 (two) times daily as needed (pain).      Marland Kitchen albuterol (PROVENTIL HFA;VENTOLIN HFA) 108 (90 BASE) MCG/ACT inhaler Inhale 1-2 puffs into the lungs every 6 (six) hours as needed for wheezing or shortness of breath.  1 Inhaler  0  . azithromycin (ZITHROMAX) 250 MG tablet Take 1 tablet (250 mg total) by mouth daily. Take first 2 tablets together, then 1 every day until finished.  6 tablet  0    Home: Home Living Family/patient expects to be discharged to:: Private residence Living Arrangements: Alone Available Help at Discharge: Family;Available 24 hours/day Type of Home: Other(Comment) Home Access: Stairs to enter Entrance Stairs-Number of Steps: 5 Entrance Stairs-Rails: Left;Right;Can reach both Home Layout: Two level;Bed/bath upstairs Alternate Level Stairs-Number of Steps: 12 Alternate Level Stairs-Rails: Left Home Equipment: None   Functional History: Prior Function Vocation: Full time employment  Functional Status:  Mobility:     Ambulation/Gait Ambulation Distance (Feet): 90 Feet Gait velocity: decreased General Gait Details: pt con't to "hug" the Left side requiring max tactile cues at hips to assist in appropriate weight shift throughout gait pattern    ADL: ADL Eating/Feeding: Supervision/safety Where Assessed - Eating/Feeding: Bed level Grooming: Supervision/safety;Set up Where Assessed - Grooming: Supported sitting Upper Body Bathing: Minimal assistance Where Assessed - Upper Body Bathing: Supported sitting Lower Body Bathing: Moderate assistance Where Assessed - Lower Body Bathing: Supported sit to stand Upper Body Dressing: Minimal assistance Where Assessed - Upper Body Dressing: Supported sitting Lower Body Dressing: Maximal assistance Where Assessed - Lower Body Dressing: Supported sit to  stand Toilet Transfer: Moderate assistance Transfers/Ambulation Related to  ADLs: mod A ADL Comments: Pt seen for self feeding with family. Pt with apparent L inattention and depth perception deficits. Pt spontaneously using LUE. Misjudging distance with L hand. Increased time for problem solving and processing.   Cognition: Cognition Overall Cognitive Status: Impaired/Different from baseline Arousal/Alertness: Awake/alert Orientation Level: Oriented X4 Attention: Sustained Sustained Attention: Impaired Sustained Attention Impairment: Verbal basic Memory: Impaired Memory Impairment: Decreased recall of new information;Decreased short term memory;Retrieval deficit Decreased Short Term Memory: Verbal basic;Functional basic Awareness: Impaired Awareness Impairment: Intellectual impairment;Emergent impairment;Anticipatory impairment Problem Solving: Impaired Problem Solving Impairment: Verbal basic;Functional basic Executive Function: Self Monitoring;Self Correcting;Decision Making Behaviors: Poor frustration tolerance Safety/Judgment: Impaired Cognition Arousal/Alertness: Awake/alert Behavior During Therapy: Flat affect Overall Cognitive Status: Impaired/Different from baseline Area of Impairment: Attention;Following commands;Awareness;Safety/judgement;Problem solving Orientation Level: Disoriented to;Place;Situation Current Attention Level: Selective Memory: Decreased recall of precautions;Decreased short-term memory Following Commands: Follows one step commands consistently Safety/Judgement: Decreased awareness of safety;Decreased awareness of deficits Awareness: Emergent Problem Solving: Requires verbal cues;Requires tactile cues General Comments: pt with appropriate transfer technique this date and appropriately asked for assist   Blood pressure 102/69, pulse 78, temperature 98.2 F (36.8 C), temperature source Oral, resp. rate 18, height 4\' 9"  (1.448 m), weight 98.9 kg (218 lb 0.6 oz), SpO2 96.00%. Physical Exam Constitutional: She appears  well-developed.  HENT:  IVC drain site is clean and dry, staples in place, a little tender Eyes: EOM are normal.  Neck: Normal range of motion. Neck supple. No thyromegaly present.  Cardiovascular: Normal rate and regular rhythm.  Respiratory: Effort normal and breath sounds normal. No respiratory distress.  GI: Soft. Bowel sounds are normal. She exhibits no distension.  Neurological: She is alert.  Mood is flat but appropriate. She makes good eye contact with examiner. She was able to state her name, age, date of birth in place. Very alert.  Moves all 4's. Strength 5/5 grossly RUE and RLE and 4 to 4+ left bicep, tricep, deltoid, wrist, hand-- 4 to 4+ left HF, KE, ankle.  Decreased fine motor skills on left.  Appears to have intact pain and light touch.  Skin: Skin is warm and dry.  Psychiatric:  In good spirits. Smiles on occasions. Very cooperative  No results found for this or any previous visit (from the past 23 hour(s)). Ct Head Wo Contrast  08/03/2013   CLINICAL DATA:  58 year old female with recent thalamic hemorrhage. Evaluate status post EVD clamped for most of today. Headache. Initial encounter.  EXAM: CT HEAD WITHOUT CONTRAST  TECHNIQUE: Contiguous axial images were obtained from the base of the skull through the vertex without intravenous contrast.  COMPARISON:  07/30/2013 and earlier.  FINDINGS: Stable visualized osseous structures. Visualized paranasal sinuses and mastoids are clear. Stable orbit and scalp soft tissues.  Stable course of the ventricular drain, terminating at or near the anterior third ventricle. Resolved small volume hemorrhage along the tract.  Stable size and configuration of hyperdense blood products centered at the right thalamus (22 x 14 mm versus 25 x 12 mm previously). No intraventricular hemorrhage identified. Stable ventricle size and configuration, without significant ventriculomegaly.  Stable basilar cisterns. Stable gray-white matter differentiation  elsewhere in the brain. No new intracranial hemorrhage. No new cortically based infarct.  IMPRESSION: 1. Stable ventricle size and configuration. No significant ventriculomegaly. 2. Stable small volume right thalamic hemorrhage. No intraventricular extension of blood and no significant mass effect.   Electronically Signed   By: Lezlie Octave.D.  On: 08/03/2013 16:53    Post Admission Physician Evaluation: 1. Functional deficits secondary  to right thalamic hemorrhage with hydrocephalus s/p IVC. 2. Patient is admitted to receive collaborative, interdisciplinary care between the physiatrist, rehab nursing staff, and therapy team. 3. Patient's level of medical complexity and substantial therapy needs in context of that medical necessity cannot be provided at a lesser intensity of care such as a SNF. 4. Patient has experienced substantial functional loss from his/her baseline which was documented above under the "Functional History" and "Functional Status" headings.  Judging by the patient's diagnosis, physical exam, and functional history, the patient has potential for functional progress which will result in measurable gains while on inpatient rehab.  These gains will be of substantial and practical use upon discharge  in facilitating mobility and self-care at the household level. 5. Physiatrist will provide 24 hour management of medical needs as well as oversight of the therapy plan/treatment and provide guidance as appropriate regarding the interaction of the two. 6. 24 hour rehab nursing will assist with bladder management, bowel management, safety, skin/wound care, disease management, medication administration, pain management and patient education  and help integrate therapy concepts, techniques,education, etc. 7. PT will assess and treat for/with: Lower extremity strength, range of motion, stamina, balance, functional mobility, safety, adaptive techniques and equipment, NMR, cognitive awareness,  education.   Goals are: mod I to supervision. 8. OT will assess and treat for/with: ADL's, functional mobility, safety, upper extremity strength, adaptive techniques and equipment, NMR, cognitive awareness, education.   Goals are: mod I to supervision. 9. SLP will assess and treat for/with: speech, cognition, communication.  Goals are: mod I to supervision. 10. Case Management and Social Worker will assess and treat for psychological issues and discharge planning. 11. Team conference will be held weekly to assess progress toward goals and to determine barriers to discharge. 12. Patient will receive at least 3 hours of therapy per day at least 5 days per week. 13. ELOS: 10-14       14. Prognosis:  excellent   Medical Problem List and Plan: 1. Right thalamic hemorrhage with hydrocephalus. IVC   removed 08/04/2013 2. DVT Prophylaxis/Anticoagulation: Subcutaneous Lovenox for DVT prophylaxis initiated 08/02/2013. Monitor for any bleeding episodes 3. Pain Management: Ultram as needed. 4. Neuropsych: This patient is not yet capable of making decisions on her own behalf. 5. Hyperlipidemia. Lipitor 6. Proteus urinary tract infection. Bactrim initiated 07/30/2013 7. Hypothyroidism. Resume Synthroid as prior to admission. 8. Hypertension. No current antihypertensive meds. Patient on Prinizide 1 tab twice daily prior to admission. Will resume as needed  Meredith Staggers, MD, [facility]  08/04/2013

## 2013-08-04 NOTE — Progress Notes (Signed)
I have discussed with pt, daughter, Dr. Joya Salm and Burnetta Sabin, [facility] to admit pt to inpt rehab today. I will arrange. [phone]

## 2013-08-04 NOTE — Progress Notes (Signed)
Stroke Team Progress Note  HISTORY Morgan Collins is an 58 y.o. female history hypertension, hyperlipidemia and hypothyroidism and poor compliance with taking prescribed medications presenting with complaint of headache on the morning of 07/27/2013 and sudden onset of weakness and numbness involving left side at 10 AM 07/27/2013. She had no previous history of stroke nor TIA. CT scan of her head showed right thalamic hemorrhage with extension into lateral and third ventricles with mass effect on the third ventricle and signs of early ventricle enlargement. NIH stroke score was 11. Patient became increasingly more lethargic in the emergency room. Repeat CT of her head showed a slight increase in size of her lateral ventricles indicative of early hydrocephalus. There was no significant increase in size of patient's thalamic hemorrhage.  Patient was not administerd TPA secondary to hemorrhage. She was admitted to the neuro ICU for further evaluation and treatment.  SUBJECTIVE No family at bedside. Pt sitting up in bed, eating breakfast. Patient denies she has any neurologic problems.  OBJECTIVE Most recent Vital Signs: Filed Vitals:   08/04/13 0550 08/04/13 0600 08/04/13 0700 08/04/13 0750  BP: 101/75 104/78 97/75   Pulse: 70 68 78   Temp:    98.2 F (36.8 C)  TempSrc:    Oral  Resp: 16 15 15    Height:      Weight:      SpO2: 97% 94% 96%    CBG (last 3)  No results found for this basename: GLUCAP,  in the last 72 hours  IV Fluid Intake:      MEDICATIONS  . atorvastatin  10 mg Oral q1800  . enoxaparin (LOVENOX) injection  40 mg Subcutaneous Q24H  . hydrochlorothiazide  12.5 mg Oral Daily  . influenza vac split quadrivalent PF  0.5 mL Intramuscular Tomorrow-1000  . lisinopril  20 mg Oral Daily  . pantoprazole  40 mg Oral QHS  . senna-docusate  1 tablet Oral BID  . sulfamethoxazole-trimethoprim  1 tablet Oral Q12H   PRN:  acetaminophen, acetaminophen, bisacodyl, labetalol,  ondansetron (ZOFRAN) IV, traMADol  Diet:  Cardiac thin liquids Activity:  As tolerated DVT Prophylaxis:  Lovenox 40 mg sq daily   CLINICALLY SIGNIFICANT STUDIES Basic Metabolic Panel:   Recent Labs Lab 07/31/13 0912  NA 133*  K 4.8  CL 97  CO2 24  GLUCOSE 108*  BUN 8  CREATININE 0.66  CALCIUM 11.0*   Liver Function Tests:  No results found for this basename: AST, ALT, ALKPHOS, BILITOT, PROT, ALBUMIN,  in the last 168 hours CBC:   Recent Labs Lab 07/31/13 0912  WBC 12.0*  NEUTROABS 9.2*  HGB 13.4  HCT 41.8  MCV 86.7  PLT 292   Coagulation:  No results found for this basename: LABPROT, INR,  in the last 168 hours Cardiac Enzymes:  No results found for this basename: CKTOTAL, CKMB, CKMBINDEX, TROPONINI,  in the last 168 hours Urinalysis:   Recent Labs Lab 07/29/13 0857  COLORURINE YELLOW  LABSPEC 1.023  PHURINE 8.0  GLUCOSEU NEGATIVE  HGBUR LARGE*  BILIRUBINUR NEGATIVE  KETONESUR NEGATIVE  PROTEINUR 100*  UROBILINOGEN 0.2  NITRITE NEGATIVE  LEUKOCYTESUR LARGE*   Lipid Panel     Component Value Date/Time   CHOL 343* 08/02/2013 0250   TRIG 97 08/02/2013 0250   HDL 61 08/02/2013 0250   CHOLHDL 5.6 08/02/2013 0250   VLDL 19 08/02/2013 0250   LDLCALC 263* 08/02/2013 0250    HgbA1C  No results found for this basename: HGBA1C  Urine Drug Screen:     Component Value Date/Time   LABOPIA NONE DETECTED 07/27/2013 1333   COCAINSCRNUR NONE DETECTED 07/27/2013 1333   LABBENZ NONE DETECTED 07/27/2013 1333   AMPHETMU NONE DETECTED 07/27/2013 1333   THCU NONE DETECTED 07/27/2013 1333   LABBARB NONE DETECTED 07/27/2013 1333    Alcohol Level:  No results found for this basename: ETH,  in the last 168 hours  CT of the brain   08/03/2013 1. Stable ventricle size and configuration. No significant ventriculomegaly. 2. Stable small volume right thalamic hemorrhage. No intraventricular extension of blood and no significant mass effect. 07/30/2013 1. Slight interval  increase in size of the lateral ventricles status post clamping of the ventricular catheter as detailed above. . 2. Stable size and appearance of right thalamic hemorrhage with intraventricular extension. No new intracranial hemorrhage or other process identified.  3.Nonvisualization of previously identified tiny right parietal subdural hematoma. 07/29/2013 1. Stable size of right the lytic hemorrhage with intraventricular extension into the third ventricle with small amount of blood present within the cerebral aqueduct. Localized mass effect with slight right-to-left midline shift at the level of the hemorrhage is unchanged. 2. Interval placement of right frontal approach ventricular catheter with tip near the foramen of Monro. Overall ventricular size is decreased relative to prior exam from 07/27/2013 without CT evidence of obstructive hydrocephalus. 07/27/2013   Again noted right thalamic hemorrhage with extension into the third ventricle and some blood in anterior horn of the right lateral ventricle. Again noted blood in duct of Sylvius to the top of fourth ventricle. Mild increase in right to left mass effect axial image 14 measures about 5 mm. Again noted the obstructive hydrocephalus with minimal progression. No new focus of hemorrhage is noted.    07/27/2013   1. Acute hemorrhage in the right thalamus with extension into the third and right lateral ventricles and into the aqueduct of Sylvius. 2. Dilatation of the lateral ventricles consistent with obstructive hydrocephalus.  MRI of the brain  07/29/2013 Right mesial thalamic hematoma with intraventricular extension. No hydrocephalus, right frontal ventriculostomy catheter in situ. 1-2 mm right parietal subdural hematoma may be post procedural. Symmetric abnormal cerebellar signal abnormality with volume loss, in addition toleft occipital remote hemorrhage, constellation of findings may reflect sequelae of PRES, though are nonspecific. Scattered  intracranial foci of susceptibility artifact/micro hemorrhages may reflect chronic hypertension.   MRA of the brain 1/61/0960 Dolicoectatic appearance of the intracranial vessels suggest sequela of chronic hypertension. No hemodynamically significant luminal irregularity of the right M2/3 branches suggest intracranial atherosclerosis.   Physical Exam: HEENT- Normocephalic, s/p EVD placement Neck supple with no masses, nodes, nodules or enlargement.  Cardiovascular - regular rate and rhythm, S1, S2 normal, no murmur, click, rub or gallop  Lungs - chest clear, no wheezing, rales, normal symmetric air entry Abdomen - soft, non-tender; bowel sounds normal; no masses, no organomegaly  Extremities - no joint deformities, effusion, or inflammation, no edema and no skin discoloration  Neurologic Examination:  Mental Status:  She is awake alert She is oriented to person and place hospital and year. She follows commands well.  Cranial Nerves:  II-Visual fields were normal.  III/IV/VI-Pupils were equal and reacted. Extraocular movements were full and conjugate.  V/VII-no facial numbness and no facial weakness.  VIII-normal.  X-mild dysarthria.  Motor:  Right upper extremity 5; left upper extremity mild pronator drift but actually has good strength on direct testing. She has antigravity 4-/5 strength of the legs. She has  support stockings and the SCD. Sensory: Reduced perception of tactile sensation over left extremities   Deep Tendon Reflexes: 2+ and symmetric.  Plantars: Extensor on the left and flexor on the right    Therapy Recommendations inpatient rehabilitation    ASSESSMENT Morgan Collins is a 58 y.o. female presenting with new-onset headache and side weakness and numbness. Imaging confirms a right thalamic hemorrhage with intraventricular extension and cytotoxic cerebral edema, midline shift of 74mm, obstructive hydrocephalus. IVC placed by Dr. Joya Salm. IVC clamped 2/13 with  increase in ventricular size & temporal horns. Drain reopened with relief; now clamped with no increase in ventricular size as CT stable without evidence of hydrocephalus. Hemorrhage most likely secondary to accelerated hypertension.  On no antithrombotics prior to admission. Antithrombotics now held due to [place]. Patient with resultant left hemiparesis, headache, vertical gaze abnormality, dysarhtria - all which have improved. She does have some memory deficits.  Hypertension - home meds resumed at half dose - after a day or so, BP dropped to the high 90-100s. BP med on hold.  Hyperlipidemia, LDL 263, on no statin PTA, now on lipitor 10 mg daily  Hx depression Thyroid disease Headache, on ultram prn Constipation. Dulcolax suppository when necessary UTI - proteus mirabilis, bactrim D5  Hospital day # 8  TREATMENT/PLAN  Hold BP meds for now. Monitor BP. Will re-introduce BP meds once pt up more consistently  Anticipate withdrawal of IVC today  Once IVC out, ok for transfer to the floor  Rehab consult in place for disposition once medically stable  SHARON BIBY, MSN, RN, ANVP-BC, ANP-BC, GNP-BC [facility] Pager: [phone] 08/04/2013 8:47 AM  I have personally obtained a history, examined the patient, evaluated imaging results, and formulated the assessment and plan of care. I agree with the above. Antony Contras, MD

## 2013-08-04 NOTE — Progress Notes (Signed)
Patient arrived from [facility] with RN via wheelchair. Patient transferred from wheelchair to bed with min A. Explained rehab process, fall prevention plan and safety plan with verbal understanding. Patient verbalized understanding of call bell usage and bed alarm. Patient resting comfortably in bed with call bell in place and bed alarm on.

## 2013-08-05 ENCOUNTER — Inpatient Hospital Stay (HOSPITAL_COMMUNITY): Payer: BC Managed Care – PPO | Admitting: Occupational Therapy

## 2013-08-05 ENCOUNTER — Inpatient Hospital Stay (HOSPITAL_COMMUNITY): Payer: BC Managed Care – PPO | Admitting: *Deleted

## 2013-08-05 ENCOUNTER — Inpatient Hospital Stay (HOSPITAL_COMMUNITY): Payer: BC Managed Care – PPO | Admitting: Speech Pathology

## 2013-08-05 DIAGNOSIS — N390 Urinary tract infection, site not specified: Secondary | ICD-10-CM

## 2013-08-05 DIAGNOSIS — I619 Nontraumatic intracerebral hemorrhage, unspecified: Secondary | ICD-10-CM

## 2013-08-05 DIAGNOSIS — I10 Essential (primary) hypertension: Secondary | ICD-10-CM

## 2013-08-05 LAB — COMPREHENSIVE METABOLIC PANEL
ALT: 18 U/L (ref 0–35)
AST: 19 U/L (ref 0–37)
Albumin: 3.2 g/dL — ABNORMAL LOW (ref 3.5–5.2)
Alkaline Phosphatase: 58 U/L (ref 39–117)
BUN: 24 mg/dL — ABNORMAL HIGH (ref 6–23)
CO2: 23 mEq/L (ref 19–32)
Calcium: 11.4 mg/dL — ABNORMAL HIGH (ref 8.4–10.5)
Chloride: 96 mEq/L (ref 96–112)
Creatinine, Ser: 1.2 mg/dL — ABNORMAL HIGH (ref 0.50–1.10)
GFR calc Af Amer: 57 mL/min — ABNORMAL LOW (ref >90)
GFR calc non Af Amer: 49 mL/min — ABNORMAL LOW (ref >90)
Glucose, Bld: 115 mg/dL — ABNORMAL HIGH (ref 70–99)
Potassium: 4.2 mEq/L (ref 3.7–5.3)
Sodium: 133 mEq/L — ABNORMAL LOW (ref 137–147)
Total Bilirubin: <0.2 mg/dL — ABNORMAL LOW (ref 0.3–1.2)
Total Protein: 7.5 g/dL (ref 6.0–8.3)

## 2013-08-05 LAB — CBC WITH DIFFERENTIAL/PLATELET
Basophils Absolute: 0 10*3/uL (ref 0.0–0.1)
Basophils Relative: 0 % (ref 0–1)
Eosinophils Absolute: 0.1 10*3/uL (ref 0.0–0.7)
Eosinophils Relative: 1 % (ref 0–5)
HCT: 40.5 % (ref 36.0–46.0)
Hemoglobin: 13 g/dL (ref 12.0–15.0)
Lymphocytes Relative: 31 % (ref 12–46)
Lymphs Abs: 3.1 10*3/uL (ref 0.7–4.0)
MCH: 27.4 pg (ref 26.0–34.0)
MCHC: 32.1 g/dL (ref 30.0–36.0)
MCV: 85.3 fL (ref 78.0–100.0)
Monocytes Absolute: 1 10*3/uL (ref 0.1–1.0)
Monocytes Relative: 10 % (ref 3–12)
Neutro Abs: 5.8 10*3/uL (ref 1.7–7.7)
Neutrophils Relative %: 58 % (ref 43–77)
Platelets: 338 10*3/uL (ref 150–400)
RBC: 4.75 MIL/uL (ref 3.87–5.11)
RDW: 15.2 % (ref 11.5–15.5)
WBC: 10 10*3/uL (ref 4.0–10.5)

## 2013-08-05 NOTE — Progress Notes (Signed)
Subjective/Complaints: No complaints. Up with therapy today. Had a good night. Headache better A 12 point review of systems has been performed and if not noted above is otherwise negative.   Objective: Vital Signs: Blood pressure 92/66, pulse 82, temperature 98.3 F (36.8 C), temperature source Oral, resp. rate 17, height 5\' 1"  (1.549 m), weight 97.5 kg (214 lb 15.2 oz), SpO2 95.00%. Ct Head Wo Contrast  08/03/2013   CLINICAL DATA:  58 year old female with recent thalamic hemorrhage. Evaluate status post EVD clamped for most of today. Headache. Initial encounter.  EXAM: CT HEAD WITHOUT CONTRAST  TECHNIQUE: Contiguous axial images were obtained from the base of the skull through the vertex without intravenous contrast.  COMPARISON:  07/30/2013 and earlier.  FINDINGS: Stable visualized osseous structures. Visualized paranasal sinuses and mastoids are clear. Stable orbit and scalp soft tissues.  Stable course of the ventricular drain, terminating at or near the anterior third ventricle. Resolved small volume hemorrhage along the tract.  Stable size and configuration of hyperdense blood products centered at the right thalamus (22 x 14 mm versus 25 x 12 mm previously). No intraventricular hemorrhage identified. Stable ventricle size and configuration, without significant ventriculomegaly.  Stable basilar cisterns. Stable gray-white matter differentiation elsewhere in the brain. No new intracranial hemorrhage. No new cortically based infarct.  IMPRESSION: 1. Stable ventricle size and configuration. No significant ventriculomegaly. 2. Stable small volume right thalamic hemorrhage. No intraventricular extension of blood and no significant mass effect.   Electronically Signed   By: Lars Pinks M.D.   On: 08/03/2013 16:53    Recent Labs  08/04/13 1851 08/05/13 0215  WBC 11.7* 10.0  HGB 13.7 13.0  HCT 42.4 40.5  PLT 346 338    Recent Labs  08/04/13 1851 08/05/13 0215  NA  --  133*  K  --   4.2  CL  --  96  GLUCOSE  --  115*  BUN  --  24*  CREATININE 1.10 1.20*  CALCIUM  --  11.4*   CBG (last 3)  No results found for this basename: GLUCAP,  in the last 72 hours  Wt Readings from Last 3 Encounters:  08/04/13 97.5 kg (214 lb 15.2 oz)  08/02/13 98.9 kg (218 lb 0.6 oz)    Physical Exam:  Constitutional: She appears well-developed.  HENT:  IVC drain site is clean and dry, staples in place, a little tender  Eyes: EOM are normal.  Neck: Normal range of motion. Neck supple. No thyromegaly present.  Cardiovascular: Normal rate and regular rhythm.  Respiratory: Effort normal and breath sounds normal. No respiratory distress.  GI: Soft. Bowel sounds are normal. She exhibits no distension.  Neurological: She is alert.  Mood is flat but appropriate. She makes good eye contact with examiner. She was able to state her name, age, date of birth in place. Very alert. Moves all 4's. Strength 5/5 grossly RUE and RLE and 4 to 4+ left bicep, tricep, deltoid, wrist, hand-- 4 to 4+ left HF, KE, ankle. Decreased fine motor skills on left. Appears to have intact pain and light touch.  Skin: Skin is warm and dry.  Psychiatric:  Cooperative. A little flat.    Assessment/Plan: 1. Functional deficits secondary to right thalamic hemorrhage which require 3+ hours per day of interdisciplinary therapy in a comprehensive inpatient rehab setting. Physiatrist is providing close team supervision and 24 hour management of active medical problems listed below. Physiatrist and rehab team continue to  assess barriers to discharge/monitor patient progress toward functional and medical goals. FIM:                   Comprehension Comprehension Mode: Auditory Comprehension: 5-Follows basic conversation/direction: With extra time/assistive device  Expression Expression Mode: Verbal Expression: 4-Expresses basic 75 - 89% of the time/requires cueing 10 - 24% of the time. Needs helper to occlude  trach/needs to repeat words.  Social Interaction Social Interaction: 4-Interacts appropriately 75 - 89% of the time - Needs redirection for appropriate language or to initiate interaction.  Problem Solving Problem Solving: 4-Solves basic 75 - 89% of the time/requires cueing 10 - 24% of the time  Memory Memory: 4-Recognizes or recalls 75 - 89% of the time/requires cueing 10 - 24% of the time   Medical Problem List and Plan:  1. Right thalamic hemorrhage with hydrocephalus. IVC removed 08/04/2013  2. DVT Prophylaxis/Anticoagulation: Subcutaneous Lovenox for DVT prophylaxis initiated 08/02/2013. Monitor for any bleeding episodes  3. Pain Management: Ultram as needed.  4. Neuropsych: This patient is not yet capable of making decisions on her own behalf.  5. Hyperlipidemia. Lipitor  6. Proteus urinary tract infection. Bactrim initiated 07/30/2013  7. Hypothyroidism. Synthroid as prior to admission.  8. Hypertension. No current antihypertensive meds. Patient on Prinizide 1 tab twice daily prior to admission. Will resume as needed 9. Renal insufficiency: encourage fluids  LOS (Days) 1 A FACE TO FACE EVALUATION WAS PERFORMED  Hassen Bruun T 08/05/2013 7:42 AM

## 2013-08-05 NOTE — Evaluation (Signed)
Physical Therapy Assessment and Plan  Patient Details  Name: Morgan Collins MRN: [id number] Date of Birth: 1956-05-04  PT Diagnosis: Abnormal posture, Abnormality of gait, Cognitive deficits, Coordination disorder, Hemiparesis non-dominant, Impaired cognition and Muscle weakness Rehab Potential: Good ELOS: 10-12 days   Today's Date: 08/05/2013 Time: 0930-1030 Time Calculation (min): 60 min  Problem List:  Patient Active Problem List   Diagnosis Date Noted  . Thalamic hemorrhage 08/04/2013  . Stroke 07/27/2013  . ICH (intracerebral hemorrhage) 07/27/2013  . Accelerated hypertension 07/27/2013  . Obstructive hydrocephalus 07/27/2013    Past Medical History:  Past Medical History  Diagnosis Date  . Hypertension   . Thyroid disease   . High cholesterol   . Depressed    Past Surgical History: No past surgical history on file.  Assessment & Plan Clinical Impression:  MAVERY MILLING is a 58 y.o. right-handed female with history of hypertension. Admitted 07/27/2013 with acute onset of headache and left-sided weakness. Cranial CT scan showed right thalamic hemorrhage with extension into lateral and third ventricles with mass effect on the third ventricle and signs of early ventricle enlargement indicative of early hydrocephalus. Underwent insertion of right frontal ventricular catheter for CSF drain 07/28/2013 per Dr. Joya Salm. Followup MRI of the brain 07/29/2013 showed no signs of hydrocephalus. Her IVC drain was removed 08/04/2013. Hospital course Proteus urinary tract infection placed on Bactrim 07/30/2013. Subcutaneous Lovenox was added for DVT prophylaxis 08/02/2013 Tolerating a regular consistency diet. Physical and occupational therapy evaluations completed 07/29/2013 with recommendations of physical medicine rehabilitation consult. Patient transferred to CIR on 08/04/2013 .   Patient currently requires min with mobility secondary to muscle weakness, decreased cardiorespiratoy  endurance, impaired timing and sequencing, unbalanced muscle activation, decreased coordination and decreased motor planning, decreased visual perceptual skills, decreased midline orientation, decreased attention to left and decreased motor planning, decreased initiation, decreased attention, decreased awareness, decreased problem solving, decreased safety awareness, decreased memory and delayed processing and decreased standing balance, decreased postural control, hemiplegia and decreased balance strategies.  Prior to hospitalization, patient was independent  with mobility and lived with Alone in a House home.  Home access is "a few"Stairs to enter.  Patient will benefit from skilled PT intervention to maximize safe functional mobility, minimize fall risk and decrease caregiver burden for planned discharge home with 24 hour supervision.  Anticipate patient will benefit from follow up OP at discharge.  PT - End of Session Activity Tolerance: Tolerates 30+ min activity with multiple rests Endurance Deficit: Yes Endurance Deficit Description: fatigues quickly, requires frequent rest breaks PT Assessment Rehab Potential: Good PT Patient demonstrates impairments in the following area(s): Balance;Endurance;Motor;Perception;Safety;Sensory PT Transfers Functional Problem(s): Bed Mobility;Bed to Chair;Car;Furniture PT Locomotion Functional Problem(s): Ambulation;Wheelchair Mobility;Stairs PT Plan PT Intensity: Minimum of 1-2 x/day ,45 to 90 minutes PT Frequency: 5 out of 7 days PT Duration Estimated Length of Stay: 10-12 days PT Treatment/Interventions: Ambulation/gait training;Balance/vestibular training;Cognitive remediation/compensation;Community reintegration;Discharge planning;Neuromuscular re-education;Functional mobility training;DME/adaptive equipment instruction;Disease management/prevention;Pain management;Patient/family education;Psychosocial support;Splinting/orthotics;UE/LE Coordination  activities;UE/LE Strength taining/ROM;Therapeutic Exercise;Therapeutic Activities;Wheelchair propulsion/positioning;Stair training;Visual/perceptual remediation/compensation PT Transfers Anticipated Outcome(s): supervision PT Locomotion Anticipated Outcome(s): supervision PT Recommendation Recommendations for Other Services: Speech consult Patient destination: Home Equipment Recommended: To be determined Equipment Details: Patient does not own any DME; recommendations TBD upon discharge  Skilled Therapeutic Intervention Therapeutic intervention initiated after completion of evaluation. Discussed falls risk, safety within room, and focus of therapy during stay. Discussed possible LOS, goals, and f/u therapy. Unsure if patient will be able to retain this information. Patient very externally  distracted throughout session and requires max cues to attend to task at hand. Patient left supine in bed with bed alarm on and all needs within reach.  PT Evaluation Precautions/Restrictions Precautions Precautions: Fall Restrictions Weight Bearing Restrictions: No General Chart Reviewed: Yes Family/Caregiver Present: No  Pain Pain Assessment Pain Assessment: No/denies pain Pain Score: 0-No pain Home Living/Prior Functioning Home Living Available Help at Discharge: Family;Available 24 hours/day (will d/c to brother's home) Type of Home: House Home Access: Stairs to enter [employer] of Steps: "a few" Entrance Stairs-Rails: Left;Right;Can reach both Home Layout: One level Alternate Level Stairs-Number of Steps: 12 Alternate Level Stairs-Rails: Left Additional Comments: Home environment based on reports from patient about brother's home set up; unsure if patient is reliable historian  Lives With: Alone Prior Function Level of Independence: Independent with transfers;Independent with gait;Independent with homemaking with ambulation  Able to Take Stairs?: Yes Driving: Yes Vocation:  Full time employment 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 Vision/Perception  Vision - History Baseline Vision: Wears glasses only for reading Patient Visual Report: No change from baseline Perception Perception: Impaired Inattention/Neglect: Does not attend to left visual field;Does not attend to left side of body Praxis Praxis: Intact  Cognition Overall Cognitive Status: Impaired/Different from baseline Arousal/Alertness: Lethargic Orientation Level: Oriented X4 Attention: Sustained Sustained Attention: Impaired Memory: Impaired Memory Impairment: Decreased recall of new information;Decreased short term memory;Retrieval deficit Decreased Short Term Memory: Verbal basic;Functional basic Awareness: Impaired Awareness Impairment: Emergent impairment;Anticipatory impairment Safety/Judgment: Impaired Sensation Sensation Light Touch: Appears Intact Proprioception: Appears Intact Coordination Gross Motor Movements are Fluid and Coordinated: No Fine Motor Movements are Fluid and Coordinated: No Coordination and Movement Description: decreased speed and accuracy with rapid, alternating movements Heel Shin Test: unable to perform with L LE; no dysmetria noted om R LE Motor  Motor Motor: Hemiplegia;Abnormal postural alignment and control Motor - Skilled Clinical Observations: generalized weakness  Mobility Bed Mobility Bed Mobility: Supine to Sit;Sit to Supine;Sitting - Scoot to Edge of Bed Supine to Sit: 4: Min assist;HOB elevated;With rails Supine to Sit Details: Verbal cues for sequencing;Verbal cues for technique;Verbal cues for precautions/safety;Manual facilitation for weight shifting;Tactile cues for initiation Sitting - Scoot to Edge of Bed: 5: Supervision;With rail Sitting - Scoot to Edge of Bed Details: Verbal cues for precautions/safety Sit to Supine: 4: Min assist;HOB flat;With rail Sit to Supine - Details: Verbal cues for sequencing;Manual facilitation for weight  shifting;Verbal cues for technique;Verbal cues for precautions/safety;Tactile cues for initiation Transfers Transfers: Yes Sit to Stand: 4: Min assist;From chair/3-in-1;From bed;With armrests;With upper extremity assist Sit to Stand Details: Verbal cues for sequencing;Verbal cues for technique;Verbal cues for precautions/safety;Manual facilitation for weight shifting;Tactile cues for initiation Stand to Sit: 4: Min assist;With upper extremity assist;With armrests;To bed;To chair/3-in-1 Stand to Sit Details (indicate cue type and reason): Verbal cues for sequencing;Manual facilitation for weight shifting;Verbal cues for technique;Verbal cues for precautions/safety;Tactile cues for initiation Stand Pivot Transfers: 4: Min assist;With armrests Stand Pivot Transfer Details: Tactile cues for initiation;Verbal cues for sequencing;Verbal cues for technique;Verbal cues for precautions/safety;Manual facilitation for weight shifting Locomotion  Ambulation Ambulation: Yes Ambulation/Gait Assistance: 4: Min assist;3: Mod assist Ambulation Distance (Feet): 52 Feet Assistive device: 1 person hand held assist;Rolling walker Ambulation/Gait Assistance Details: Tactile cues for initiation;Manual facilitation for weight shifting;Verbal cues for precautions/safety;Verbal cues for gait pattern;Tactile cues for posture Ambulation/Gait Assistance Details: Patient performed gait training in controlled environment and in ADL apartment 42' x1 with R HHA and minA and 23' x1 with RW and minA. Patient demonstrates L inattention  and is very externally distracted by posters on walls, people passing by, etc. Max cues for attention to task and to maintain gaze in front of her. Gait Gait: Yes Gait Pattern: Impaired Gait Pattern: Step-through pattern;Decreased step length - left;Decreased stride length;Narrow base of support;Trunk flexed;Decreased stance time - right;Decreased weight shift to left Stairs / Additional  Locomotion Stairs: Yes Stairs Assistance: 4: Min assist Stairs Assistance Details: Verbal cues for sequencing;Verbal cues for technique;Verbal cues for precautions/safety;Tactile cues for initiation;Manual facilitation for weight shifting;Tactile cues for sequencing Stairs Assistance Details (indicate cue type and reason): Requires repeated multimodal cues for proper sequencing Stair Management Technique: Two rails;Step to pattern;Forwards Number of Stairs: 2 Height of Stairs: 6 Wheelchair Mobility Wheelchair Mobility: Yes Wheelchair Assistance: 4: Advertising account executive Details: Tactile cues for initiation;Verbal cues for sequencing;Verbal cues for technique;Verbal cues for precautions/safety;Verbal cues for safe use of DME/AE Wheelchair Propulsion: Both lower extermities Wheelchair Parts Management: Needs assistance Distance: 80  Trunk/Postural Assessment  Cervical Assessment Cervical Assessment: Exceptions to [facility] (forward head posture) Thoracic Assessment Thoracic Assessment: Within Functional Limits Lumbar Assessment Lumbar Assessment: Within Functional Limits Postural Control Postural Control: Deficits on evaluation Righting Reactions: delayed Protective Responses: delayed Postural Limitations: L lateral trunk lean in standing  Balance Balance Balance Assessed: Yes Static Sitting Balance Static Sitting - Balance Support: No upper extremity supported;Right upper extremity supported;Left upper extremity supported;Feet supported Static Sitting - Level of Assistance: 5: Stand by assistance Dynamic Sitting Balance Dynamic Sitting - Balance Support: No upper extremity supported;Feet supported Dynamic Sitting - Level of Assistance: 5: Stand by assistance Sitting balance - Comments: donning shoes Static Standing Balance Static Standing - Balance Support: No upper extremity supported;Bilateral upper extremity supported;During functional activity Static Standing - Level of  Assistance: 4: Min assist Extremity Assessment  RLE Assessment RLE Assessment: Exceptions to [facility] RLE Strength RLE Overall Strength: Deficits RLE Overall Strength Comments: Generalized weakness; grossly 3+/5 LLE Assessment LLE Assessment: Exceptions to [facility] LLE Strength LLE Overall Strength: Deficits LLE Overall Strength Comments: Generalized weakness; grossly 3+/5  FIM:  FIM - Bed/Chair Transfer Bed/Chair Transfer Assistive Devices: Arm rests;HOB elevated;Bed rails Bed/Chair Transfer: 4: Supine > Sit: Min A (steadying Pt. > 75%/lift 1 leg);4: Sit > Supine: Min A (steadying pt. > 75%/lift 1 leg);4: Chair or W/C > Bed: Min A (steadying Pt. > 75%);4: Bed > Chair or W/C: Min A (steadying Pt. > 75%) FIM - Locomotion: Wheelchair Distance: 80 Locomotion: Wheelchair: 2: Travels 50 - 149 ft with minimal assistance (Pt.>75%) FIM - Locomotion: Ambulation Locomotion: Ambulation Assistive Devices: Walker - Rolling;Other (comment) (RW and R HHA) Ambulation/Gait Assistance: 4: Min assist;3: Mod assist Locomotion: Ambulation: 2: Travels 50 - 149 ft with moderate assistance (Pt: 50 - 74%) FIM - Locomotion: Stairs Locomotion: Scientist, physiological: Hand rail - 2 Locomotion: Stairs: 1: Up and Down < 4 stairs with minimal assistance (Pt.>75%)   Refer to Care Plan for Long Term Goals  Recommendations for other services: None  Discharge Criteria: Patient will be discharged from PT if patient refuses treatment 3 consecutive times without medical reason, if treatment goals not met, if there is a change in medical status, if patient makes no progress towards goals or if patient is discharged from hospital.  The above assessment, treatment plan, treatment alternatives and goals were discussed and mutually agreed upon: by patient  Lillia Abed. Tuyen Uncapher, PT, DPT 08/05/2013, 10:54 AM

## 2013-08-05 NOTE — Evaluation (Signed)
Speech Language Pathology Assessment and Plan  Patient Details  Name: Morgan Collins MRN: [id number] Date of Birth: [birth year]  SLP Diagnosis: Cognitive Impairments  Rehab Potential: Excellent ELOS: 10-12 days   Today's Date: 08/05/2013 Time: 0800-0855 Time Calculation (min): 55 min  Problem List:  Patient Active Problem List   Diagnosis Date Noted  . Thalamic hemorrhage 08/04/2013  . Stroke 07/27/2013  . ICH (intracerebral hemorrhage) 07/27/2013  . Accelerated hypertension 07/27/2013  . Obstructive hydrocephalus 07/27/2013   Past Medical History:  Past Medical History  Diagnosis Date  . Hypertension   . Thyroid disease   . High cholesterol   . Depressed    Past Surgical History: No past surgical history on file.  Assessment / Plan / Recommendation Clinical Impression Patient is a 58 y.o. right-handed female with history of hypertension. Admitted 07/27/2013 with acute onset of headache and left-sided weakness. Cranial CT scan showed right thalamic hemorrhage with extension into lateral and third ventricles with mass effect on the third ventricle and signs of early ventricle enlargement indicative of early hydrocephalus. Underwent insertion of right frontal ventricular catheter for CSF drain 07/28/2013 per Dr. Joya Salm. Follow-up MRI of the brain 07/29/2013 showed no signs of hydrocephalus. Her IVC drain was removed 08/04/2013. Subcutaneous Lovenox was added for DVT prophylaxis 08/02/2013. Patient is tolerating a regular diet with thin liquids. Physical and occupational therapy evaluations completed 07/29/2013 with recommendations of physical medicine rehabilitation consult. Patient transferred to CIR on 08/04/2013 and presents with moderate cognitive impairments characterized by decreased attention to left, decreased initiation, decreased sustained attention, decreased intellectual awareness, decreased functional problem solving, decreased safety awareness, decreased memory and  delayed processing all of which impact the patient's overall safety with functional tasks. Pt would benefit from skilled SLP intervention to maximize cognitive function and overall functional independence prior to discharge home. Anticipate pt will require 24 hour supervision at discharge and would benefit from f/u outpatient services.   Skilled Therapeutic Interventions          Administered cognitive-linguistic evaluation. Please see above for details. Educated pt on current cognitive-linguistic impairments and goals of skilled SLP intervention. Pt requires reinforcement of information.   SLP Assessment  Patient will need skilled [facility] during CIR admission    Recommendations  Oral Care Recommendations: Oral care BID Recommendations for Other Services: Neuropsych consult Patient destination: Home Follow up Recommendations: Outpatient SLP;Home Health SLP;24 hour supervision/assistance Equipment Recommended: None recommended by SLP    SLP Frequency 5 out of 7 days   SLP Treatment/Interventions Cognitive remediation/compensation;Cueing hierarchy;Functional tasks;Environmental controls;Internal/external aids;Therapeutic Activities;Patient/family education    Pain Pain Assessment Pain Assessment: No/denies pain Prior Functioning    Short Term Goals: Week 1: SLP Short Term Goal 1 (Week 1): Pt will sustain attention to a functional task for 5 minutes with Mod A verbal cues for redirection. SLP Short Term Goal 2 (Week 1): Pt will identify 2 cognitive deficits with Min A question and contextual cues.  SLP Short Term Goal 3 (Week 1): Pt will utilize call bell to request wants/needs with Min A question and verbal cues.  SLP Short Term Goal 4 (Week 1): Pt will demonstrate functional problem solving for basic and familiar tasks with Min A multimodal cues.  SLP Short Term Goal 5 (Week 1): Pt will attend to left field of enviornment/body during functional tasks with Min A  verbal and question cues.  SLP Short Term Goal 6 (Week 1): Pt will utilize schedule to recall new, dialy information with Min  A multimodal cueing  See FIM for current functional status Refer to Care Plan for Long Term Goals  Recommendations for other services: Neuropsych  Discharge Criteria: Patient will be discharged from SLP if patient refuses treatment 3 consecutive times without medical reason, if treatment goals not met, if there is a change in medical status, if patient makes no progress towards goals or if patient is discharged from hospital.  The above assessment, treatment plan, treatment alternatives and goals were discussed and mutually agreed upon: by patient  Morgan Collins 08/05/2013, 5:00 PM

## 2013-08-05 NOTE — Evaluation (Signed)
Occupational Therapy Assessment and Plan  Patient Details  Name: Morgan Collins MRN: [id number] Date of Birth: [birth year]  OT Diagnosis: cognitive deficits, disturbance of vision and muscle weakness (generalized) Rehab Potential: Rehab Potential: Excellent ELOS: 10-12 days   Today's Date: 08/05/2013 Time: 700-800  Time Calculation (min): 60 min  Problem List:  Patient Active Problem List   Diagnosis Date Noted  . Thalamic hemorrhage 08/04/2013  . Stroke 07/27/2013  . ICH (intracerebral hemorrhage) 07/27/2013  . Accelerated hypertension 07/27/2013  . Obstructive hydrocephalus 07/27/2013    Past Medical History:  Past Medical History  Diagnosis Date  . Hypertension   . Thyroid disease   . High cholesterol   . Depressed    Past Surgical History: No past surgical history on file.  Assessment & Plan Clinical Impression: Morgan Collins is a 58 y.o. RH female w/ hx of HTN. Admitted 07/27/13 w/ acute onset of headache and L weakness. Cranial CT scan showed R thalamic hemorrhage w/ extension into lateral and third ventricles w/ mass effect on the third ventricle and signs of early ventricle enlargement indicative of early hydrocephalus. Underwent insertion of R frontal ventricular catheter for CSF drain 07/28/13. Followup MRI of the brain 07/29/13 showed no signs of hydrocephalus. IVC drain was removed 08/04/13.  Patient transferred to CIR on 08/04/2013 .    Patient currently requires min with basic self-care skills secondary to muscle weakness, decreased visual acuity and reports seeing objects that are not present, decreased initiation, decreased problem solving, decreased safety awareness and delayed processing and decreased standing balance, decreased balance strategies and difficulty maintaining precautions.  Prior to hospitalization, patient could complete adl with independent .  Patient will benefit from skilled intervention to increase independence with basic self-care skills prior  to discharge home with care partner.  Anticipate patient will require intermittent supervision and follow up outpatient.  OT - End of Session Activity Tolerance: Improving Endurance Deficit: Yes Endurance Deficit Description: fatigues quickly, requires frequent rest breaks OT Assessment Rehab Potential: Excellent OT Patient demonstrates impairments in the following area(s): Balance;Cognition;Endurance;Safety;Vision OT Basic ADL's Functional Problem(s): Grooming;Bathing;Dressing;Toileting OT Advanced ADL's Functional Problem(s): Simple Meal Preparation;Laundry;Light Housekeeping OT Transfers Functional Problem(s): Toilet;Tub/Shower OT Plan OT Intensity: Minimum of 1-2 x/day, 45 to 90 minutes OT Frequency: 5 out of 7 days OT Duration/Estimated Length of Stay: 10-12 days OT Treatment/Interventions: Balance/vestibular training;Cognitive remediation/compensation;Community reintegration;Discharge planning;DME/adaptive equipment instruction;Functional mobility training;Therapeutic Activities;Self Care/advanced ADL retraining;Patient/family education OT Self Feeding Anticipated Outcome(s): independent OT Basic Self-Care Anticipated Outcome(s): supervision OT Toileting Anticipated Outcome(s): supervision OT Bathroom Transfers Anticipated Outcome(s): supervision OT Recommendation Patient destination: Home Equipment Recommended: Rolling walker with 5" wheels;3 in 1 bedside comode Equipment Details: to be further assessed   Skilled Therapeutic Intervention Pt seen for initial evaluation and established plan of care for pending d/c home to brother's house. Pt will have assistance of siblings and daughter ( 61 yo)  1:1 self care retraining: Pt complete supine <>Sit using bed rail hob 20 degrees. Pt with delayed responses to all request. Pt progressed with RW to sink level. Pt needed mod cueing to initiate task. Pt with decr sustained attention. Pt is able to reach bil LE at this time for hygiene and  dressing. Pt requesting void of bowel and bladder. Pt complete transfer with standard commode and grab bars. Pt needed extended time for every grooming and adl task. Pt needed tactile input and verbal input to attend to left side of body. Pt washing chest but neglected left arm. Pt  With static  stare at times during session and when provided questioning cue pt states I was just thinking. Pt reports no visual changes at this time. Pt is able to locate all necessary items however second session to focus on more visual assessment. Pt without glasses. Pt setup for breakfast with SLP arriving.  Second session 1300- 1330 Pt supine in bed and reports not knowing where she was upon waking up. Pt reports I knew i was okay when i saw the "red" Pt has a red piece of cloth on bed rail. Pt explains further that red cloth is prayer cloth from church and she knew she was okay when she saw it. Pt reports falling asleep and waking up thinking she was at church. Pt needed bed rail and max cueing to progress to eob. Pt able to progress and agreeable however needed cues for initiation. Pt sitting eob. Pt with visual assessment. Pt with Lt eye decr smooth pursuits. Pt with Rt eye occluded and deficits tracking therapist with undershooting.pt with LT eye occluded and more brisk responses of Rt eye. Pt asked to copy 3 images triangle house and flower. Pt adding x2 extra window panes to house windows and writing daughters name on front door of house. Pt drawing 1 less flower petal. Pt correcting errors when asked to compare two images. Pt tilting head. Pt turning paper to the left. Pt reports seeing a man jumping out of the chimney. Pt says "or it could be smoke but it looks like a man jumping out"  Pt next presented with circle. Pt given verbal cues to draw a clock saying 1:30. Pt cued to write numbers and clock hands. Pt writing extra numbers and x3 hands on the clock. One long hand pointing to 12 and 2 shorter hands pointing to 6. Pt  provided visual of actual clock 1:30 pt verbalized errors 100% correct. Pt return to supine with brother present. Bed alarm set and patient educated alarm set in case she becomes confused.  OT Evaluation Precautions/Restrictions  Precautions Precautions: Fall Precaution Comments: 3 staples Rt side scalp Restrictions Weight Bearing Restrictions: No General   Vital Signs   Pain Pain Assessment Pain Assessment: No/denies pain Pain Score: 0-No pain Home Living/Prior Functioning Home Living Available Help at Discharge: Family;Available 24 hours/day (will d/c to brother's home) Type of Home: House Home Access: Stairs to enter [employer] of Steps: "a few" Entrance Stairs-Rails: Left;Right;Can reach both Home Layout: One level Alternate Level Stairs-Number of Steps: 12 Alternate Level Stairs-Rails: Left Additional Comments: Home environment based on reports from patient about brother's home set up; unsure if patient is reliable historian  Lives With: Alone IADL History Homemaking Responsibilities: Yes Meal Prep Responsibility: Primary Laundry Responsibility: Primary Cleaning Responsibility: Primary Bill Paying/Finance Responsibility: Primary Shopping Responsibility: Primary Child Care Responsibility: No Current License: Yes Mode of Transportation: Car Occupation: Full time employment Type of Occupation: 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 ADL ADL ADL Comments: see FIM Vision/Perception   Vision - History Baseline Vision: Wears glasses only for reading Patient Visual Report: blurry  Vision - Assessment impaired decr smooth pursuits left eye Eye Alignment: Impaired (comment) Perception Perception: Impaired Inattention/Neglect: Does not attend to left visual field;Does not attend to left side of body Praxis Praxis: Intact  Cognition Overall Cognitive Status: Impaired/Different from baseline Arousal/Alertness: Awake/alert Orientation Level: Oriented X4 Attention: Sustained Sustained Attention: Impaired Sustained Attention Impairment: Verbal basic;Functional basic Memory: Impaired Memory Impairment: Decreased recall of new information;Decreased short term memory;Retrieval deficit Decreased Short Term Memory: Verbal basic;Functional basic Awareness: Impaired Awareness Impairment: Emergent impairment;Anticipatory impairment Safety/Judgment: Impaired Sensation Sensation Light Touch: Appears Intact Hot/Cold: Appears Intact Proprioception: Appears Intact Coordination Gross Motor Movements are Fluid and Coordinated: No Fine Motor Movements are Fluid and Coordinated: No Coordination and Movement Description: decreased speed and accuracy with rapid, alternating movements Heel Shin Test: unable to perform with L LE; no dysmetria noted om R LE Motor  Motor Motor: Hemiplegia;Abnormal postural alignment and control Motor - Skilled Clinical Observations: generalized weakness Mobility  Bed Mobility Bed Mobility: Supine to Sit;Sitting - Scoot to Edge of Bed Supine to Sit: 4: Min assist;HOB elevated;With rails Supine to Sit Details: Verbal cues for sequencing;Verbal cues for technique;Verbal cues for precautions/safety;Manual facilitation for weight shifting;Tactile cues for initiation Sitting - Scoot to Edge of Bed: 5: Supervision;With rail Sitting - Scoot to Edge of Bed Details: Verbal cues for precautions/safety Sit to Supine: 4: Min assist;HOB flat;With rail Sit to  Supine - Details: Verbal cues for sequencing;Manual facilitation for weight shifting;Verbal cues for technique;Verbal cues for precautions/safety;Tactile cues for initiation Transfers Sit to Stand: 4: Min guard;With upper extremity assist;From chair/3-in-1 Sit to Stand Details: Tactile cues for initiation Stand to Sit: 4: Min guard;With upper extremity assist;To chair/3-in-1 Stand to Sit Details (indicate cue type and reason): Verbal cues for sequencing;Manual facilitation for weight shifting;Verbal cues for technique;Verbal cues for precautions/safety;Tactile cues for initiation  Trunk/Postural Assessment  Cervical Assessment Cervical Assessment: Exceptions to [facility] (forward head posture) Thoracic Assessment Thoracic Assessment: Within Functional Limits Lumbar Assessment Lumbar Assessment: Within Functional Limits Postural Control Postural Control: Deficits on evaluation Righting Reactions: delayed Protective Responses: delayed Postural Limitations: L lateral trunk lean in standing  Balance Balance Balance Assessed: Yes Static Sitting Balance Static Sitting - Balance Support: No upper extremity supported;Right upper extremity supported;Left upper extremity supported;Feet supported Static Sitting - Level of Assistance: 5: Stand by assistance Dynamic Sitting Balance Dynamic Sitting - Balance Support: No upper extremity supported;Feet supported Dynamic Sitting - Level of Assistance: 5: Stand by assistance Sitting balance - Comments: donning shoes Static Standing Balance Static Standing - Balance Support: No upper extremity supported;Bilateral upper extremity supported;During functional activity Static Standing - Level of Assistance: 4: Min assist Extremity/Trunk Assessment RUE Assessment RUE Assessment: Within Functional Limits LUE Assessment LUE Assessment: Within Functional Limits  FIM:  FIM - Bed/Chair Transfer Bed/Chair Transfer Assistive Devices: Arm rests;HOB elevated;Bed  rails Bed/Chair Transfer: 4: Supine > Sit: Min A (steadying Pt. > 75%/lift 1 leg);4: Sit > Supine: Min A (steadying pt. > 75%/lift 1 leg);4: Chair or W/C > Bed: Min A (steadying Pt. > 75%);4: Bed > Chair or W/C: Min A (steadying Pt. > 75%)   Refer to Care Plan for Long Term Goals  Recommendations for other services: Neuropsych  Discharge Criteria: Patient will be discharged from OT if patient refuses treatment 3 consecutive times without medical reason, if treatment goals not met, if there is a change in medical status, if patient makes no progress towards goals or if patient is discharged from hospital.  The above assessment, treatment plan, treatment alternatives  and goals were discussed and mutually agreed upon: by patient  Peri Maris 08/05/2013, 11:07 AM  Pager: [phone]

## 2013-08-05 NOTE — Progress Notes (Signed)
Late entry. NIH stroke scale on 07/29/2013 at 0800 was a 1, for not alert, rouses easily.

## 2013-08-05 NOTE — Progress Notes (Signed)
Patient information reviewed and entered into eRehab system by Makenzi Bannister, RN, CRRN, PPS Coordinator.  Information including medical coding and functional independence measure will be reviewed and updated through discharge.    

## 2013-08-06 ENCOUNTER — Inpatient Hospital Stay (HOSPITAL_COMMUNITY): Payer: BC Managed Care – PPO | Admitting: Physical Therapy

## 2013-08-06 ENCOUNTER — Inpatient Hospital Stay (HOSPITAL_COMMUNITY): Payer: BC Managed Care – PPO

## 2013-08-06 ENCOUNTER — Inpatient Hospital Stay (HOSPITAL_COMMUNITY): Payer: BC Managed Care – PPO | Admitting: Speech Pathology

## 2013-08-06 ENCOUNTER — Encounter (HOSPITAL_COMMUNITY): Payer: BC Managed Care – PPO

## 2013-08-06 DIAGNOSIS — I619 Nontraumatic intracerebral hemorrhage, unspecified: Secondary | ICD-10-CM

## 2013-08-06 DIAGNOSIS — N390 Urinary tract infection, site not specified: Secondary | ICD-10-CM

## 2013-08-06 DIAGNOSIS — I10 Essential (primary) hypertension: Secondary | ICD-10-CM

## 2013-08-06 NOTE — Plan of Care (Signed)
Problem: RH BOWEL ELIMINATION Goal: RH STG MANAGE BOWEL W/MEDICATION W/ASSISTANCE STG Manage Bowel with Medication with min Assistance.  Outcome: Not Progressing LBM 08/01/13. Sorbitol given at HS. adm

## 2013-08-06 NOTE — Progress Notes (Signed)
Inpatient [facility] Individual Statement of Services  Patient Name:  Morgan Collins  Date:  08/06/2013  Welcome to the [facility].  Our goal is to provide you with an individualized program based on your diagnosis and situation, designed to meet your specific needs.  With this comprehensive rehabilitation program, you will be expected to participate in at least 3 hours of rehabilitation therapies Monday-Friday, with modified therapy programming on the weekends.  Your rehabilitation program will include the following services:  Physical Therapy (PT), Occupational Therapy (OT), Speech Therapy (ST), 24 hour per day rehabilitation nursing, Therapeutic Recreaction (TR), Neuropsychology, Case Management (Social Worker), Rehabilitation Medicine, Nutrition Services and Pharmacy Services  Weekly team conferences will be held on Tuesdays to discuss your progress.  Your Social Worker will talk with you frequently to get your input and to update you on team discussions.  Team conferences with you and your family in attendance may also be held.  Expected length of stay: 10-12 days  Overall anticipated outcome: supervision  Depending on your progress and recovery, your program may change. Your Social Worker will coordinate services and will keep you informed of any changes. Your Social Worker's name and contact numbers are listed  below.  The following services may also be recommended but are not provided by the [facility] will be made to provide these services after discharge if needed.  Arrangements include referral to agencies that provide these services.  Your insurance has been verified to be:  [place] Your primary doctor is:  None  Pertinent information will be shared with your doctor and your insurance  company.  Social Worker:  [place], [place] or ([phone]   Information discussed with and copy given to patient by: Lennart Pall, 08/06/2013, 5:56 PM

## 2013-08-06 NOTE — Progress Notes (Signed)
Social Work  Social Work Assessment and Plan  Patient Details  Name: Morgan Collins MRN: [id number] Date of Birth: [birth year]  Today's Date: 08/06/2013  Problem List:  Patient Active Problem List   Diagnosis Date Noted  . Thalamic hemorrhage 08/04/2013  . Stroke 07/27/2013  . ICH (intracerebral hemorrhage) 07/27/2013  . Accelerated hypertension 07/27/2013  . Obstructive hydrocephalus 07/27/2013   Past Medical History:  Past Medical History  Diagnosis Date  . Hypertension   . Thyroid disease   . High cholesterol   . Depressed    Past Surgical History: No past surgical history on file. Social History:  reports that she has never smoked. She does not have any smokeless tobacco history on file. She reports that she does not drink alcohol. Her drug history is not on file.  Family / Support Systems Marital Status: Single 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;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) Children: daughter, [name] @ [phone] and son, [name] @ [phone] Other Supports: brother, [name] @ [phone] and sister, [name] @ [phone] Anticipated Caregiver: family Ability/Limitations of Caregiver: family to take shifts to provide 24/7 supervision initally at brother's home Caregiver Availability: 24/7 Family Dynamics: pt describes all of her family as very supportive  Social History Preferred language: English Religion: Christian Cultural Background: NA Education: HS Read: Yes Write: Yes Employment Status: Employed 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 [employer] of Employment: 1 (yr) Return to Work Plans: pt very doubtful she can manage the demands of her current job and feels she needs to make SSD Pharmacist, hospital Issues: none Guardian/Conservator: none - per MD, pt not yet capable of making decisions  on her own behalf - will monitor but daughter to be primary contact/ decision maker   Abuse/Neglect Physical Abuse: Denies Verbal Abuse: Denies Sexual Abuse: Denies Self-Neglect: Denies  Emotional Status Pt's affect, behavior adn adjustment status: Pt very talkative and with continuous train of conversation, often with her eyes closed.  Most information is relevant to initial question, however, had to redirect often to encourage completion of her thought.  Pt appears very calm and relaxed throughotu, however, does speak of her concerns about the financial strains with not being able to work..  She denies any significant emotional distress.  Will monitor. Recent Psychosocial Issues: None Pyschiatric History: Pt reports that she suffered meningitis in 1981 which "did something to my mind".  Reports she was place in psychiatric unit at [facility] for several months and then "I just knew I had to get better because I had two kids and I worked really hard."  Pt notes she was able to regain her independence and return to work. Notes she has had not had any other psychiatric admissions/ treatments since that time. Substance Abuse History: none  Patient / Family Perceptions, Expectations & Goals Pt/Family understanding of illness & functional limitations: pt and daughter with basic understanding of her hemorrhage and as it relates to her HTN and poor control.  Good understanding of her functional limitations and need for CIR. Premorbid pt/family roles/activities: Pt working full-time PTA and  completely independent Anticipated changes in roles/activities/participation: Pt's goals set for supervision overall which indicates that return to work is doubtful and family will need to assume some caregiver responsibilities Pt/family expectations/goals: "I just want to focus on getting better.  I don't want to be a  burden."  [employer]: None Premorbid Home Care/DME Agencies:  None Transportation available at discharge: yes Resource referrals recommended: Neuropsychology;Support group (specify);Other (Comment) (SSD referral)  Discharge Planning Living Arrangements: Alone Support Systems: Children;Other relatives Type of Residence: Private residence Insurance Resources: Multimedia programmer (specify) Nurse, mental health) Financial Resources: Employment Financial Screen Referred: No Living Expenses: Education officer, community Management: Patient Does the patient have any problems obtaining your medications?: No Home Management: pt Patient/Family Preliminary Plans: pt to d/c initially to brother's home where family will share in providing 24/7 assistance Social Work Anticipated Follow Up Needs: HH/OP;Support Group Expected length of stay: 10-12 days  Clinical Impression Very pleasant woman who was able to complete assessment without difficulty but did require some redirection.  Denies any significant emotional distress, however, with concerns about her financial situation and about her concerns of not being a "burden" on her family.  Good family support and able to provide 24/7 assist upon d/c. May benefit from neuropsych consult.  Will follow for emotional support and d/c planning needs.  Zorawar Strollo 08/06/2013, 6:34 PM

## 2013-08-06 NOTE — IPOC Note (Signed)
Overall Plan of Care [facility]) Patient Details Name: Morgan Collins MRN: [id number] DOB: 1955/12/08  Admitting Diagnosis: [place]  Hospital Problems: Active Problems:   Thalamic hemorrhage     Functional Problem List: Nursing Behavior;Edema;Endurance;Medication Management;Motor;Nutrition;Pain;Perception;Safety;Sensory;Skin Integrity;Bowel  PT Balance;Endurance;Motor;Perception;Safety;Sensory  OT Balance;Cognition;Endurance;Safety;Vision  SLP Cognition  TR         Basic ADL's: OT Grooming;Bathing;Dressing;Toileting     Advanced  ADL's: OT Simple Meal Preparation;Laundry;Light Housekeeping     Transfers: PT Bed Mobility;Bed to Chair;Car;Furniture  OT Toilet;Tub/Shower     Locomotion: PT Ambulation;Wheelchair Mobility;Stairs     Additional Impairments: OT    SLP Social Cognition   Social Interaction;Problem Solving;Memory;Attention;Awareness  TR      Anticipated Outcomes Item Anticipated Outcome  Self Feeding independent  Swallowing  N/A   Basic self-care  supervision  Toileting  supervision   Bathroom Transfers supervision  Bowel/Bladder  min A  Transfers  supervision  Locomotion  supervision  Communication  N/A  Cognition  supervision  Pain  <3 on a 0-10 scale  Safety/Judgment  min A   Therapy Plan: PT Intensity: Minimum of 1-2 x/day ,45 to 90 minutes PT Frequency: 5 out of 7 days PT Duration Estimated Length of Stay: 10-12 days OT Intensity: Minimum of 1-2 x/day, 45 to 90 minutes OT Frequency: 5 out of 7 days OT Duration/Estimated Length of Stay: 10-12 days SLP Intensity: Minumum of 1-2 x/day, 30 to 90 minutes SLP Frequency: 5 out of 7 days SLP Duration/Estimated Length of Stay: 10-12 days       Team Interventions: Nursing Interventions Patient/Family Education;Disease Management/Prevention;Pain Management;Medication Management;Skin Care/Wound Management;Cognitive Remediation/Compensation;Discharge Planning;Psychosocial Support;Bowel  Management  PT interventions Ambulation/gait training;Balance/vestibular training;Cognitive remediation/compensation;Community reintegration;Discharge planning;Neuromuscular re-education;Functional mobility training;DME/adaptive equipment instruction;Disease management/prevention;Pain management;Patient/family education;Psychosocial support;Splinting/orthotics;UE/LE Coordination activities;UE/LE Strength taining/ROM;Therapeutic Exercise;Therapeutic Activities;Wheelchair propulsion/positioning;Stair training;Visual/perceptual remediation/compensation  OT Interventions Balance/vestibular training;Cognitive remediation/compensation;Community reintegration;Discharge planning;DME/adaptive equipment instruction;Functional mobility training;Therapeutic Activities;Self Care/advanced ADL retraining;Patient/family education  SLP Interventions Cognitive remediation/compensation;Cueing hierarchy;Functional tasks;Environmental controls;Internal/external aids;Therapeutic Activities;Patient/family education  TR Interventions    SW/CM Interventions      Team Discharge Planning: Destination: PT-Home ,OT- Home , SLP-Home Projected Follow-up: PT- , OT-   , SLP-Outpatient SLP;Home Health SLP;24 hour supervision/assistance Projected Equipment Needs: PT-To be determined, OT- Rolling walker with 5" wheels;3 in 1 bedside comode, SLP-None recommended by SLP Equipment Details: PT-Patient does not own any DME; recommendations TBD upon discharge, OT-to be further assessed Patient/family involved in discharge planning: PT- Patient,  OT-Patient, SLP-Patient  MD ELOS: 10 days Medical Rehab Prognosis:  Excellent Assessment: The patient has been admitted for CIR therapies. The team will be addressing, functional mobility, strength, stamina, balance, safety, adaptive techniques/equipment, self-care, bowel and bladder mgt, patient and caregiver education, NMR, visual perceptual awareness. Goals have been set at supervision for most  mobility and self-care tasks.     Meredith Staggers, MD, FAAPMR      See Team Conference Notes for weekly updates to the plan of care

## 2013-08-06 NOTE — Progress Notes (Signed)
Speech Language Pathology Daily Session Note  Patient Details  Name: Morgan Collins MRN: [id number] Date of Birth: [birth year]  Today's Date: 08/06/2013 Time: 7711-6579 Time Calculation (min): 45 min  Short Term Goals: Week 1: SLP Short Term Goal 1 (Week 1): Pt will sustain attention to a functional task for 5 minutes with Mod A verbal cues for redirection. SLP Short Term Goal 2 (Week 1): Pt will identify 2 cognitive deficits with Min A question and contextual cues.  SLP Short Term Goal 3 (Week 1): Pt will utilize call bell to request wants/needs with Min A question and verbal cues.  SLP Short Term Goal 4 (Week 1): Pt will demonstrate functional problem solving for basic and familiar tasks with Min A multimodal cues.  SLP Short Term Goal 5 (Week 1): Pt will attend to left field of enviornment/body during functional tasks with Min A verbal and question cues.  SLP Short Term Goal 6 (Week 1): Pt will utilize schedule to recall new, dialy information with Min A multimodal cueing  Skilled Therapeutic Interventions: Treatment focus on cognitive goals. SLP facilitated session by providing basic and mildly complex reading comprehension tasks. Pt completed tasks with 100% accuracy and extra time. Pt also participated in written expression tasks and independently self-monitored errors and required Max cueing to self-correct. Pt demonstrated difficulty with expressing specific information and was impulsive/verbose in her written answers.  Pt demonstrated increased sustained attention throughout the session and was Mod I for functional problem solving with money management and independently utilized external aids to increase accuracy. Continue with current plan of care.    FIM:  Comprehension Comprehension Mode: Auditory Comprehension: 5-Follows basic conversation/direction: With extra time/assistive device Expression Expression Mode: Verbal Expression: 5-Expresses basic 90% of the time/requires  cueing < 10% of the time. Social Interaction Social Interaction: 4-Interacts appropriately 75 - 89% of the time - Needs redirection for appropriate language or to initiate interaction. Problem Solving Problem Solving: 4-Solves basic 75 - 89% of the time/requires cueing 10 - 24% of the time Memory Memory: 4-Recognizes or recalls 75 - 89% of the time/requires cueing 10 - 24% of the time  Pain Pain Assessment Pain Assessment: No/denies pain  Therapy/Group: Individual Therapy  Morgan Collins 08/06/2013, 1:30 PM

## 2013-08-06 NOTE — Progress Notes (Signed)
Subjective/Complaints: No complaints. Had a good day with therapy. Slept well.  A 12 point review of systems has been performed and if not noted above is otherwise negative.   Objective: Vital Signs: Blood pressure 106/71, pulse 95, temperature 98.5 F (36.9 C), temperature source Oral, resp. rate 18, height 5\' 1"  (1.549 m), weight 97.5 kg (214 lb 15.2 oz), SpO2 94.00%. No results found.  Recent Labs  08/04/13 1851 08/05/13 0215  WBC 11.7* 10.0  HGB 13.7 13.0  HCT 42.4 40.5  PLT 346 338    Recent Labs  08/04/13 1851 08/05/13 0215  NA  --  133*  K  --  4.2  CL  --  96  GLUCOSE  --  115*  BUN  --  24*  CREATININE 1.10 1.20*  CALCIUM  --  11.4*   CBG (last 3)  No results found for this basename: GLUCAP,  in the last 72 hours  Wt Readings from Last 3 Encounters:  08/04/13 97.5 kg (214 lb 15.2 oz)  08/02/13 98.9 kg (218 lb 0.6 oz)    Physical Exam:  Constitutional: She appears well-developed.  HENT:  IVC drain site is clean and dry, staples in place, a little tender  Eyes: EOM are normal.  Neck: Normal range of motion. Neck supple. No thyromegaly present.  Cardiovascular: Normal rate and regular rhythm.  Respiratory: Effort normal and breath sounds normal. No respiratory distress.  GI: Soft. Bowel sounds are normal. She exhibits no distension.  Neurological: She is alert.  Mood is flat but appropriate. She makes good eye contact with examiner. She was able to state her name, age, date of birth in place. Very alert. Moves all 4's. Strength 5/5 grossly RUE and RLE and 4 to 4+ left bicep, tricep, deltoid, wrist, hand-- 4 to 4+ left HF, KE, ankle. Decreased fine motor skills on left. Appears to have intact pain and light touch.  Skin: Skin is warm and dry.  Psychiatric:  Cooperative. A little flat.    Assessment/Plan: 1. Functional deficits secondary to right thalamic hemorrhage which require 3+ hours per day of interdisciplinary therapy in a  comprehensive inpatient rehab setting. Physiatrist is providing close team supervision and 24 hour management of active medical problems listed below. Physiatrist and rehab team continue to assess barriers to discharge/monitor patient progress toward functional and medical goals. FIM: FIM - Bathing Bathing Steps Patient Completed: Chest;Right Arm;Left Arm;Abdomen;Front perineal area;Buttocks;Right upper leg;Left upper leg;Right lower leg (including foot);Left lower leg (including foot) Bathing: 4: Min-Patient completes 8-9 27f 10 parts or 75+ percent  FIM - Upper Body Dressing/Undressing Upper body dressing/undressing steps patient completed: Thread/unthread right bra strap;Thread/unthread left bra strap;Hook/unhook bra;Thread/unthread right sleeve of pullover shirt/dresss;Thread/unthread left sleeve of pullover shirt/dress;Put head through opening of pull over shirt/dress;Pull shirt over trunk Upper body dressing/undressing: 5: Set-up assist to: Obtain clothing/put away FIM - Lower Body Dressing/Undressing Lower body dressing/undressing steps patient completed: Thread/unthread right underwear leg;Thread/unthread left underwear leg;Pull underwear up/down;Thread/unthread right pants leg;Thread/unthread left pants leg;Pull pants up/down;Fasten/unfasten pants;Don/Doff right sock;Don/Doff left sock Lower body dressing/undressing: 5: Set-up assist to: Obtain clothing  FIM - Toileting Toileting steps completed by patient: Adjust clothing prior to toileting;Performs perineal hygiene;Adjust clothing after toileting Toileting Assistive Devices: Grab bar or rail for support Toileting: 4: Steadying assist  FIM - Radio producer Devices: Mining engineer Transfers: 4-To toilet/BSC: Min A (steadying Pt. > 75%);4-From toilet/BSC: Min A (steadying Pt. > 75%)  FIM -  Bed/Chair Financial planner Devices: Arm rests;HOB elevated;Bed rails Bed/Chair  Transfer: 4: Supine > Sit: Min A (steadying Pt. > 75%/lift 1 leg);4: Sit > Supine: Min A (steadying pt. > 75%/lift 1 leg);4: Chair or W/C > Bed: Min A (steadying Pt. > 75%);4: Bed > Chair or W/C: Min A (steadying Pt. > 75%)  FIM - Locomotion: Wheelchair Distance: 80 Locomotion: Wheelchair: 2: Travels 50 - 149 ft with minimal assistance (Pt.>75%) FIM - Locomotion: Ambulation Locomotion: Ambulation Assistive Devices: Walker - Rolling;Other (comment) (RW and R HHA) Ambulation/Gait Assistance: 4: Min assist;3: Mod assist Locomotion: Ambulation: 2: Travels 50 - 149 ft with moderate assistance (Pt: 50 - 74%)  Comprehension Comprehension Mode: Auditory Comprehension: 5-Follows basic conversation/direction: With extra time/assistive device  Expression Expression Mode: Verbal Expression: 4-Expresses basic 75 - 89% of the time/requires cueing 10 - 24% of the time. Needs helper to occlude trach/needs to repeat words.  Social Interaction Social Interaction: 3-Interacts appropriately 50 - 74% of the time - May be physically or verbally inappropriate.  Problem Solving Problem Solving: 3-Solves basic 50 - 74% of the time/requires cueing 25 - 49% of the time  Memory Memory: 3-Recognizes or recalls 50 - 74% of the time/requires cueing 25 - 49% of the time   Medical Problem List and Plan:  1. Right thalamic hemorrhage with hydrocephalus. IVC removed 08/04/2013  2. DVT Prophylaxis/Anticoagulation: Subcutaneous Lovenox for DVT prophylaxis initiated 08/02/2013. Monitor for any bleeding episodes  3. Pain Management: Ultram as needed.  4. Neuropsych: This patient is not yet capable of making decisions on her own behalf.  5. Hyperlipidemia. Lipitor  6. Proteus urinary tract infection. Bactrim initiated 07/30/2013  7. Hypothyroidism. Synthroid as prior to admission.  8. Hypertension. No current antihypertensive meds. --normotensive. Patient on Prinizide 1 tab twice daily prior to admission. Will resume as  needed 9. Renal insufficiency: encourage fluids--recheck tomorrow  LOS (Days) 2 A FACE TO FACE EVALUATION WAS PERFORMED  Kinnley Paulson T 08/06/2013 8:01 AM

## 2013-08-06 NOTE — Progress Notes (Signed)
Physical Therapy Note  Patient Details  Name: Morgan Collins MRN: [id number] Date of Birth: 1955-11-19 Today's Date: 08/06/2013  Time: 858-952 54 minutes  1:1 No c/o pain.  Sitting balance edge of bed for dressing with supervision, no LOB. Sit to stand for dressing with min guard A. Gait training with RW 2 x 150' with min A, trendelenberg gait on L hip, manual facilitation to activate L glutes in stance.  Gait with obstacle negotiation with RW with min A, cues for safety.  Gait with obstacles without AD, min A cues for safety and foot clearance.  Tall kneeling with reaching task with pt able to maintain tall kneeling x 30 seconds before fatigued.  Quadruped alternating UE lifts with tactile cues for glute activation.  Supine NMR for hip and core strengthening LTR with add squeeze, bridging with add squeeze, alt LE lifts with add squeeze.  Pt able to perform with min facilitation.  Pt improved with L attention, continues to lose balance with head turns during gait.   Imir Brumbach 08/06/2013, 9:50 AM

## 2013-08-06 NOTE — Progress Notes (Signed)
Occupational Therapy Session Note  Patient Details  Name: JULIANNA VANWAGNER MRN: [id number] Date of Birth: 05-Aug-1955  Today's Date: 08/06/2013 Time: 1000-1100 Time Calculation (min): 60 min  Short Term Goals: Week 1:  OT Short Term Goal 1 (Week 1): Pt will gather ADL items MIN (A) OT Short Term Goal 2 (Week 1): Pt will located 2 out 4 objects on left side with min cueing OT Short Term Goal 3 (Week 1): Pt will complete adls supervision level within set time frame established at beginning of session OT Short Term Goal 4 (Week 1): Pt will demonstrate sustained attention for 5 minutes during adls  Skilled Therapeutic Interventions/Progress Updates:    Pt was in recliner upon arrival.  Pt transferred from recliner to standing with RW with steady A, pt sat in wc to complete UB/LB bathing and dressing.  Pt required min safety cueing when standing from chair, and was able to carryover instructions throughout session.  Pt transferred with RW and steady A to bathroom and completed toileting routine.  Pt was able to stand at sink with close supervision to wash front/back peri area and to complete oral care routine. Focus on: Increasing independence in ADL performance, increasing endurance and activity tolerance.      Therapy Documentation Precautions:  Precautions Precautions: Fall Precaution Comments: 3 staples Rt side scalp Restrictions Weight Bearing Restrictions: No  See FIM for current functional status  Therapy/Group: Individual Therapy  Tamiya Colello 08/06/2013, 11:18 AM

## 2013-08-06 NOTE — Progress Notes (Signed)
Note reviewed and accurately reflects treatment session.   

## 2013-08-06 NOTE — Progress Notes (Signed)
Physical Therapy Session Note  Patient Details  Name: Morgan Collins MRN: [id number] Date of Birth: [birth year]  Today's Date: 08/06/2013 Time: 1100-1128 Time Calculation (min): 28 min  Short Term Goals: Week 1:  PT Short Term Goal 1 (Week 1): STGs=LTGs secondary to LOS  Skilled Therapeutic Interventions/Progress Updates:   Pt fatigued from all therapies this AM but agreeable to participate. Focused on simulated car transfer in preparation for d/c home (S with RW), stair negotiation with bilateral rails with close S/steady A, and gait training with RW for endurance and strengthening. Pt overall close S with all transfers and gait with intermittent cueing needed for obstacle negotiation on L side.   Therapy Documentation Precautions:  Precautions Precautions: Fall Precaution Comments: 3 staples Rt side scalp Restrictions Weight Bearing Restrictions: No Pain: Pain Assessment Pain Assessment: No/denies pain Locomotion : Ambulation Ambulation/Gait Assistance: 5: Supervision   See FIM for current functional status  Therapy/Group: Individual Therapy  Canary Brim [facility] 08/06/2013, 12:08 PM

## 2013-08-07 ENCOUNTER — Inpatient Hospital Stay (HOSPITAL_COMMUNITY): Payer: BC Managed Care – PPO | Admitting: Physical Therapy

## 2013-08-07 ENCOUNTER — Inpatient Hospital Stay (HOSPITAL_COMMUNITY): Payer: BC Managed Care – PPO | Admitting: Occupational Therapy

## 2013-08-07 ENCOUNTER — Inpatient Hospital Stay (HOSPITAL_COMMUNITY): Payer: BC Managed Care – PPO | Admitting: Speech Pathology

## 2013-08-07 LAB — BASIC METABOLIC PANEL
BUN: 15 mg/dL (ref 6–23)
CO2: 21 mEq/L (ref 19–32)
Calcium: 11.2 mg/dL — ABNORMAL HIGH (ref 8.4–10.5)
Chloride: 101 mEq/L (ref 96–112)
Creatinine, Ser: 1.06 mg/dL (ref 0.50–1.10)
GFR calc Af Amer: 66 mL/min — ABNORMAL LOW (ref >90)
GFR calc non Af Amer: 57 mL/min — ABNORMAL LOW (ref >90)
Glucose, Bld: 172 mg/dL — ABNORMAL HIGH (ref 70–99)
Potassium: 4.8 mEq/L (ref 3.7–5.3)
Sodium: 135 mEq/L — ABNORMAL LOW (ref 137–147)

## 2013-08-07 NOTE — Progress Notes (Signed)
Speech Language Pathology Daily Session Note  Patient Details  Name: Morgan Collins MRN: [id number] Date of Birth: 1956/02/21  Today's Date: 08/07/2013 Time: 1430-1500 Time Calculation (min): 30 min  Short Term Goals: Week 1: SLP Short Term Goal 1 (Week 1): Pt will sustain attention to a functional task for 5 minutes with Mod A verbal cues for redirection. SLP Short Term Goal 2 (Week 1): Pt will identify 2 cognitive deficits with Min A question and contextual cues.  SLP Short Term Goal 3 (Week 1): Pt will utilize call bell to request wants/needs with Min A question and verbal cues.  SLP Short Term Goal 4 (Week 1): Pt will demonstrate functional problem solving for basic and familiar tasks with Min A multimodal cues.  SLP Short Term Goal 5 (Week 1): Pt will attend to left field of enviornment/body during functional tasks with Min A verbal and question cues.  SLP Short Term Goal 6 (Week 1): Pt will utilize schedule to recall new, dialy information with Min A multimodal cueing  Skilled Therapeutic Interventions: Skilled ST intervention provided with focus on cognitive and self-care tasks. Pt seen in room for ST treatment session.  Pt required moderate encouragement to participate in medication management tasks. Pt states that she is very familiar with her medications and listed each one, including their administration instructions. Pt completed med management tasks with 80% accuracy, min assist. Pt noted with decreased attention to alternate attention between two tasks. Pt required 2 verbal cues for redirection. Slp reviewed call bell use and safety precautions. Pt expressed understanding and demonstrated independence with call bell.    FIM:  Comprehension Comprehension Mode: Auditory Comprehension: 5-Follows basic conversation/direction: With no assist Expression Expression Mode: Verbal Expression: 5-Expresses basic needs/ideas: With no assist Social Interaction Social Interaction:  5-Interacts appropriately 90% of the time - Needs monitoring or encouragement for participation or interaction. Problem Solving Problem Solving: 5-Solves basic 90% of the time/requires cueing < 10% of the time Memory Memory: 5-Recognizes or recalls 90% of the time/requires cueing < 10% of the time  Pain Pain Assessment Pain Assessment: No/denies pain Pain Score: 0-No pain Pain Type: Acute pain Pain Location: Head Pain Intervention(s): Medication (See eMAR)  Therapy/Group: Individual Therapy  Hasel Janish, Bernerd Pho 08/07/2013, 3:10 PM

## 2013-08-07 NOTE — Progress Notes (Signed)
Physical Therapy Session Note  Patient Details  Name: Morgan Collins MRN: [id number] Date of Birth: 1956/04/30  Today's Date: 08/07/2013 Time: 1300-1400 Time Calculation (min): 60 min  Short Term Goals: Week 1:  PT Short Term Goal 1 (Week 1): STGs=LTGs secondary to LOS  Therapy Documentation Precautions:  Precautions Precautions: Fall Precaution Comments: 3 staples Rt side scalp Restrictions Weight Bearing Restrictions: No  Pain: headache 8/10 nursing notified and given Tylenol  Therapeutic Exercise:(15') Nu-Step Level 4, x 15'  Gait Training:(30')  Using RW 2 x 300' with S/Mod-I and 4 x 100' w/o assistive device S/Mod-I Therapeutic Activity:(15') transfer training S/Mod-I with sit<->stand and chair <-> bed with verbal cues to reach beck for armrest when sitting.   Therapy/Group: Individual Therapy  Carolos Fecher J 08/07/2013, 1:12 PM

## 2013-08-07 NOTE — Progress Notes (Signed)
Occupational Therapy Session Note  Patient Details  Name: Morgan Collins MRN: [id number] Date of Birth: 1955/08/16  Today's Date: 08/07/2013 Time: 1110-1210 Time Calculation (min): 60 min  Short Term Goals: Week 1:  OT Short Term Goal 1 (Week 1): Pt will gather ADL items MIN (A) OT Short Term Goal 2 (Week 1): Pt will located 2 out 4 objects on left side with min cueing OT Short Term Goal 3 (Week 1): Pt will complete adls supervision level within set time frame established at beginning of session OT Short Term Goal 4 (Week 1): Pt will demonstrate sustained attention for 5 minutes during adls     Skilled Therapeutic Interventions/Progress Updates:    Pt seen for ADL retraining of grooming, bathing at sink level, and dressing with a focus on activity tolerance and safety awareness.  Pt moved extremely slowly throughout this session, stopping what she was doing frequently to talk with her daughter who was visiting.  Pt needed a great deal of encouragement to stand more during the session as she wanted to sit in w/c.  Pt needed several cues to fully lock breaks before standing.  Pt would be told to lock them and she would respond that she would do it, but then would stand up without the chair being secure.  Pt was able to stand and doff pants over feet, don shirt over head without LOB.  Pt transferred with Rw to recliner as lunch was about to arrive.    Therapy Documentation Precautions:  Precautions Precautions: Fall Precaution Comments: 3 staples Rt side scalp Restrictions Weight Bearing Restrictions: No      Pain:  began to c/o headache. Pt stated she would ask nurse later for medication if needed. ADL: ADL ADL Comments: see FIM  See FIM for current functional status  Therapy/Group: Individual Therapy  [place] 08/07/2013, 12:21 PM

## 2013-08-07 NOTE — Progress Notes (Signed)
Physical Therapy Session Note  Patient Details  Name: Morgan Collins MRN: [id number] Date of Birth: 1956/03/21  Today's Date: 08/07/2013 Time: 0730-0815 Time Calculation (min): 45 min  Short Term Goals: Week 1:  PT Short Term Goal 1 (Week 1): STGs=LTGs secondary to LOS  Therapy Documentation Precautions:  Precautions Precautions: Fall Precaution Comments: 3 staples Rt side scalp Restrictions Weight Bearing Restrictions: No Pain:denies pain  Patient resting in bed and agreeable to participate with PT.   Therapeutic Activity:(15') bed mobility Mod-Independent with use of bed rail, transfers supine to sit with Mod-Independent assist, sit<->stand into RW with S/Mod-I, bed<->chair S/Mod-I. Therapeutic Exercise:(15') Nu-Step Level 4 x 1 minutes with average pace at 50 steps/minute Gait Training:(15') using RW 2 x 300' with S/Mod-I. Patient able to scan R and L and able to negotiate objects on L and R side today without LOB and without bumping into objects. Up/Down 12 steps with B handrails S/Mod-I and able to clear feet over steps with mis-stepping.   See FIM for current functional status  Therapy/Group: Individual Therapy  Clearence Ped 08/07/2013, 7:32 AM

## 2013-08-07 NOTE — Progress Notes (Signed)
Patient medicated with Tylenol at beginning of shift for complaint of rib/chest soreness/pulling.  Tylenol effective with no further complaints of discomfort.  Patient states that discomfort may have come from "working with therapy".  No shortness of breath.  Skin warm and dry to touch.  No other complaints voiced during this shift.  Ambulated to bathroom with walker and supervision during the night.  Continent of bladder.  Will continue to monitor for changes in condition.   Morgan Collins M

## 2013-08-07 NOTE — Progress Notes (Signed)
Subjective/Complaints: Up with PT this am. Doing well. Slept well. Denies pain.  A 12 point review of systems has been performed and if not noted above is otherwise negative.   Objective: Vital Signs: Blood pressure 91/60, pulse 81, temperature 98.1 F (36.7 C), temperature source Oral, resp. rate 19, height 5\' 1"  (1.549 m), weight 97.5 kg (214 lb 15.2 oz), SpO2 93.00%. No results found.  Recent Labs  08/04/13 1851 08/05/13 0215  WBC 11.7* 10.0  HGB 13.7 13.0  HCT 42.4 40.5  PLT 346 338    Recent Labs  08/04/13 1851 08/05/13 0215  NA  --  133*  K  --  4.2  CL  --  96  GLUCOSE  --  115*  BUN  --  24*  CREATININE 1.10 1.20*  CALCIUM  --  11.4*   CBG (last 3)  No results found for this basename: GLUCAP,  in the last 72 hours  Wt Readings from Last 3 Encounters:  08/04/13 97.5 kg (214 lb 15.2 oz)  08/02/13 98.9 kg (218 lb 0.6 oz)    Physical Exam:  Constitutional: She appears well-developed.  HENT:  IVC drain site is clean and dry, staples in place, a little tender  Eyes: EOM are normal.  Neck: Normal range of motion. Neck supple. No thyromegaly present.  Cardiovascular: Normal rate and regular rhythm.  Respiratory: Effort normal and breath sounds normal. No respiratory distress.  GI: Soft. Bowel sounds are normal. She exhibits no distension.  Neurological: She is alert.  Mood is flat but appropriate. She makes good eye contact with examiner. She was able to state her name, age, date of birth in place. Very alert. Moves all 4's. Strength 5/5 grossly RUE and RLE and 4 to 4+ left bicep, tricep, deltoid, wrist, hand-- 4 to 4+ left HF, KE, ankle. Decreased fine motor skills on left. Appears to have intact pain and light touch. Still with left inattention but doing a better job scanning to the left side. Skin: Skin is warm and dry.  Psychiatric:  Cooperative. A little flat.    Assessment/Plan: 1. Functional deficits secondary to right thalamic hemorrhage  which require 3+ hours per day of interdisciplinary therapy in a comprehensive inpatient rehab setting. Physiatrist is providing close team supervision and 24 hour management of active medical problems listed below. Physiatrist and rehab team continue to assess barriers to discharge/monitor patient progress toward functional and medical goals. FIM: FIM - Bathing Bathing Steps Patient Completed: Chest;Right Arm;Left Arm;Abdomen;Front perineal area;Buttocks Bathing: 5: Supervision: Safety issues/verbal cues  FIM - Upper Body Dressing/Undressing Upper body dressing/undressing steps patient completed: Thread/unthread right sleeve of pullover shirt/dresss;Thread/unthread left sleeve of pullover shirt/dress;Put head through opening of pull over shirt/dress;Pull shirt over trunk Upper body dressing/undressing: 5: Supervision: Safety issues/verbal cues FIM - Lower Body Dressing/Undressing Lower body dressing/undressing steps patient completed: Thread/unthread right pants leg;Thread/unthread left pants leg;Pull pants up/down;Pull underwear up/down;Thread/unthread left underwear leg;Thread/unthread right underwear leg Lower body dressing/undressing: 5: Set-up assist to: Obtain clothing  FIM - Toileting Toileting steps completed by patient: Adjust clothing prior to toileting;Performs perineal hygiene;Adjust clothing after toileting Toileting Assistive Devices: Grab bar or rail for support Toileting: 5: Supervision: Safety issues/verbal cues  FIM - Radio producer Devices: Mining engineer Transfers: 4-To toilet/BSC: Min A (steadying Pt. > 75%);4-From toilet/BSC: Min A (steadying Pt. > 75%)  FIM - Control and instrumentation engineer Devices: Walker;Arm rests Bed/Chair Transfer: 5: Bed > Chair or  W/C: Supervision (verbal cues/safety issues);5: Chair or W/C > Bed: Supervision (verbal cues/safety issues)  FIM - Locomotion: Wheelchair Distance:  80 Locomotion: Wheelchair: 0: Activity did not occur FIM - Locomotion: Ambulation Locomotion: Ambulation Assistive Devices: Administrator Ambulation/Gait Assistance: 5: Supervision Locomotion: Ambulation: 2: Travels 50 - 149 ft with supervision/safety issues  Comprehension Comprehension Mode: Auditory Comprehension: 5-Follows basic conversation/direction: With extra time/assistive device  Expression Expression Mode: Verbal Expression: 5-Expresses basic needs/ideas: With no assist  Social Interaction Social Interaction: 4-Interacts appropriately 75 - 89% of the time - Needs redirection for appropriate language or to initiate interaction.  Problem Solving Problem Solving: 5-Solves basic 90% of the time/requires cueing < 10% of the time  Memory Memory: 5-Recognizes or recalls 90% of the time/requires cueing < 10% of the time   Medical Problem List and Plan:  1. Right thalamic hemorrhage with hydrocephalus. IVC removed 08/04/2013  2. DVT Prophylaxis/Anticoagulation: Subcutaneous Lovenox for DVT prophylaxis initiated 08/02/2013. Monitor for any bleeding episodes  3. Pain Management: Ultram as needed.  4. Neuropsych: This patient is not yet capable of making decisions on her own behalf.  5. Hyperlipidemia. Lipitor  6. Proteus urinary tract infection. Bactrim initiated 07/30/2013  7. Hypothyroidism. Synthroid as prior to admission.  8. Hypertension. No current antihypertensive meds. --normotensive to low. Patient on Prinizide 1 tab twice daily prior to admission. Will resume as needed 9. Renal insufficiency: encourage fluids--recheck tomorrow  LOS (Days) 3 A FACE TO FACE EVALUATION WAS PERFORMED  Yvette Roark T 08/07/2013 7:58 AM

## 2013-08-08 NOTE — Progress Notes (Signed)
Subjective/Complaints: No new issues A 12 point review of systems has been performed and if not noted above is otherwise negative.   Objective: Vital Signs: Blood pressure 104/69, pulse 84, temperature 98.7 F (37.1 C), temperature source Oral, resp. rate 17, height 5\' 1"  (1.549 m), weight 97.5 kg (214 lb 15.2 oz), SpO2 93.00%. No results found. No results found for this basename: WBC, HGB, HCT, PLT,  in the last 72 hours  Recent Labs  08/07/13 0920  NA 135*  K 4.8  CL 101  GLUCOSE 172*  BUN 15  CREATININE 1.06  CALCIUM 11.2*   CBG (last 3)  No results found for this basename: GLUCAP,  in the last 72 hours  Wt Readings from Last 3 Encounters:  08/04/13 97.5 kg (214 lb 15.2 oz)  08/02/13 98.9 kg (218 lb 0.6 oz)    Physical Exam:  Constitutional: She appears well-developed.  HENT:  IVC drain site is clean and dry, staples in place, a little tender  Eyes: EOM are normal.  Neck: Normal range of motion. Neck supple. No thyromegaly present.  Cardiovascular: Normal rate and regular rhythm.  Respiratory: Effort normal and breath sounds normal. No respiratory distress.  GI: Soft. Bowel sounds are normal. She exhibits no distension.  Neurological: She is alert.  Mood is flat but appropriate. She makes good eye contact with examiner. She was able to state her name, age, date of birth in place. Very alert. Moves all 4's. Strength 5/5 grossly RUE and RLE and 4 to 4+ left bicep, tricep, deltoid, wrist, hand-- 4 to 4+ left HF, KE, ankle. Decreased fine motor skills on left. Appears to have intact pain and light touch. Still with left inattention but doing a better job scanning to the left side. Skin: Skin is warm and dry.  Psychiatric:  Cooperative. A little flat.    Assessment/Plan: 1. Functional deficits secondary to right thalamic hemorrhage which require 3+ hours per day of interdisciplinary therapy in a comprehensive inpatient rehab setting. Physiatrist is  providing close team supervision and 24 hour management of active medical problems listed below. Physiatrist and rehab team continue to assess barriers to discharge/monitor patient progress toward functional and medical goals. FIM: FIM - Bathing Bathing Steps Patient Completed: Chest;Right Arm;Left Arm;Abdomen;Front perineal area;Buttocks;Right upper leg;Left upper leg (chose to not wash feet today) Bathing: 5: Supervision: Safety issues/verbal cues  FIM - Upper Body Dressing/Undressing Upper body dressing/undressing steps patient completed: Thread/unthread right sleeve of pullover shirt/dresss;Thread/unthread left sleeve of pullover shirt/dress;Put head through opening of pull over shirt/dress;Pull shirt over trunk Upper body dressing/undressing: 5: Set-up assist to: Obtain clothing/put away FIM - Lower Body Dressing/Undressing Lower body dressing/undressing steps patient completed: Thread/unthread right pants leg;Thread/unthread left pants leg;Pull pants up/down;Pull underwear up/down;Thread/unthread left underwear leg;Thread/unthread right underwear leg Lower body dressing/undressing: 5: Set-up assist to: Obtain clothing  FIM - Toileting Toileting steps completed by patient: Adjust clothing prior to toileting;Performs perineal hygiene;Adjust clothing after toileting Toileting Assistive Devices: Grab bar or rail for support Toileting: 6: Assistive device: No helper  FIM - Radio producer Devices: Mining engineer Transfers: 4-To toilet/BSC: Min A (steadying Pt. > 75%);4-From toilet/BSC: Min A (steadying Pt. > 75%)  FIM - Control and instrumentation engineer Devices: Walker;Arm rests Bed/Chair Transfer: 5: Bed > Chair or W/C: Supervision (verbal cues/safety issues);5: Chair or W/C > Bed: Supervision (verbal cues/safety issues)  FIM - Locomotion: Wheelchair Distance: 80 Locomotion: Wheelchair: 0: Activity did not occur  FIM - Locomotion:  Ambulation Locomotion: Ambulation Assistive Devices: Walker - Rolling Ambulation/Gait Assistance: 5: Supervision Locomotion: Ambulation: 2: Travels 50 - 149 ft with supervision/safety issues  Comprehension Comprehension Mode: Auditory Comprehension: 5-Follows basic conversation/direction: With extra time/assistive device  Expression Expression Mode: Verbal Expression: 4-Expresses basic 75 - 89% of the time/requires cueing 10 - 24% of the time. Needs helper to occlude trach/needs to repeat words.  Social Interaction Social Interaction: 3-Interacts appropriately 50 - 74% of the time - May be physically or verbally inappropriate.  Problem Solving Problem Solving: 3-Solves basic 50 - 74% of the time/requires cueing 25 - 49% of the time  Memory Memory: 3-Recognizes or recalls 50 - 74% of the time/requires cueing 25 - 49% of the time   Medical Problem List and Plan:  1. Right thalamic hemorrhage with hydrocephalus. IVC removed 08/04/2013  2. DVT Prophylaxis/Anticoagulation: Subcutaneous Lovenox for DVT prophylaxis initiated 08/02/2013. Monitor for any bleeding episodes  3. Pain Management: Ultram as needed.  4. Neuropsych: This patient is not yet capable of making decisions on her own behalf.  5. Hyperlipidemia. Lipitor  6. Proteus urinary tract infection. Bactrim initiated 07/30/2013  7. Hypothyroidism. Synthroid as prior to admission.  8. Hypertension. No current antihypertensive meds. --normotensive to low. Patient on Prinizide 1 tab twice daily prior to admission. Will resume as needed 9. Renal insufficiency: encourage fluids-improved   LOS (Days) 4 A FACE TO FACE EVALUATION WAS PERFORMED  Gillermo Poch T 08/08/2013 9:02 AM

## 2013-08-08 NOTE — Progress Notes (Signed)
Patient slept quietly throughout night awakening for bathroom only.  Medicated for complaint of headache this morning with Tylenol as ordered.    Fredna Dow M

## 2013-08-09 ENCOUNTER — Encounter (HOSPITAL_COMMUNITY): Payer: BC Managed Care – PPO

## 2013-08-09 ENCOUNTER — Inpatient Hospital Stay (HOSPITAL_COMMUNITY): Payer: BC Managed Care – PPO | Admitting: Speech Pathology

## 2013-08-09 ENCOUNTER — Inpatient Hospital Stay (HOSPITAL_COMMUNITY): Payer: BC Managed Care – PPO | Admitting: *Deleted

## 2013-08-09 DIAGNOSIS — I619 Nontraumatic intracerebral hemorrhage, unspecified: Secondary | ICD-10-CM

## 2013-08-09 DIAGNOSIS — N390 Urinary tract infection, site not specified: Secondary | ICD-10-CM

## 2013-08-09 DIAGNOSIS — I10 Essential (primary) hypertension: Secondary | ICD-10-CM

## 2013-08-09 NOTE — Progress Notes (Signed)
Subjective/Complaints: Slept well. Excited by progress. Anxious to get home A 12 point review of systems has been performed and if not noted above is otherwise negative.   Objective: Vital Signs: Blood pressure 109/67, pulse 67, temperature 97.6 F (36.4 C), temperature source Oral, resp. rate 17, height 5\' 1"  (1.549 m), weight 97.5 kg (214 lb 15.2 oz), SpO2 97.00%. No results found. No results found for this basename: WBC, HGB, HCT, PLT,  in the last 72 hours  Recent Labs  08/07/13 0920  NA 135*  K 4.8  CL 101  GLUCOSE 172*  BUN 15  CREATININE 1.06  CALCIUM 11.2*   CBG (last 3)  No results found for this basename: GLUCAP,  in the last 72 hours  Wt Readings from Last 3 Encounters:  08/04/13 97.5 kg (214 lb 15.2 oz)  08/02/13 98.9 kg (218 lb 0.6 oz)    Physical Exam:  Constitutional: She appears well-developed.  HENT:  IVC drain site is clean and dry, staples in place, a little tender  Eyes: EOM are normal.  Neck: Normal range of motion. Neck supple. No thyromegaly present.  Cardiovascular: Normal rate and regular rhythm.  Respiratory: Effort normal and breath sounds normal. No respiratory distress.  GI: Soft. Bowel sounds are normal. She exhibits no distension.  Neurological: She is alert.  Mood is flat but appropriate. She makes good eye contact with examiner. She was able to state her name, age, date of birth in place. Very alert. Moves all 4's. Strength 5/5 grossly RUE and RLE and   4+ left bicep, tricep, deltoid, wrist, hand--  4+ left HF, KE, ankle.  Appears to have intact pain and light touch. Still with left inattention but doing a better job scanning to the left side. Skin: Skin is warm and dry. Suture/staples clean Psychiatric:  Cooperative. A little flat.    Assessment/Plan: 1. Functional deficits secondary to right thalamic hemorrhage which require 3+ hours per day of interdisciplinary therapy in a comprehensive inpatient rehab  setting. Physiatrist is providing close team supervision and 24 hour management of active medical problems listed below. Physiatrist and rehab team continue to assess barriers to discharge/monitor patient progress toward functional and medical goals. FIM: FIM - Bathing Bathing Steps Patient Completed: Chest;Right Arm;Left Arm;Abdomen;Front perineal area;Buttocks;Right upper leg;Left upper leg (chose to not wash feet today) Bathing: 5: Supervision: Safety issues/verbal cues  FIM - Upper Body Dressing/Undressing Upper body dressing/undressing steps patient completed: Thread/unthread right sleeve of pullover shirt/dresss;Thread/unthread left sleeve of pullover shirt/dress;Put head through opening of pull over shirt/dress;Pull shirt over trunk Upper body dressing/undressing: 5: Set-up assist to: Obtain clothing/put away FIM - Lower Body Dressing/Undressing Lower body dressing/undressing steps patient completed: Thread/unthread right pants leg;Thread/unthread left pants leg;Pull pants up/down;Pull underwear up/down;Thread/unthread left underwear leg;Thread/unthread right underwear leg Lower body dressing/undressing: 5: Set-up assist to: Obtain clothing  FIM - Toileting Toileting steps completed by patient: Adjust clothing prior to toileting;Performs perineal hygiene;Adjust clothing after toileting Toileting Assistive Devices: Grab bar or rail for support Toileting: 6: Assistive device: No helper  FIM - Radio producer Devices: Mining engineer Transfers: 4-To toilet/BSC: Min A (steadying Pt. > 75%);4-From toilet/BSC: Min A (steadying Pt. > 75%)  FIM - Control and instrumentation engineer Devices: Walker;Arm rests Bed/Chair Transfer: 5: Bed > Chair or W/C: Supervision (verbal cues/safety issues);5: Sit > Supine: Supervision (verbal cues/safety issues);5: Chair or W/C > Bed: Supervision (verbal cues/safety issues)  FIM - Locomotion:  Wheelchair  Distance: 80 Locomotion: Wheelchair: 0: Activity did not occur FIM - Locomotion: Ambulation Locomotion: Ambulation Assistive Devices: Administrator Ambulation/Gait Assistance: 5: Supervision Locomotion: Ambulation: 2: Travels 50 - 149 ft with supervision/safety issues  Comprehension Comprehension Mode: Auditory Comprehension: 5-Follows basic conversation/direction: With extra time/assistive device  Expression Expression Mode: Verbal Expression: 4-Expresses basic 75 - 89% of the time/requires cueing 10 - 24% of the time. Needs helper to occlude trach/needs to repeat words.  Social Interaction Social Interaction: 3-Interacts appropriately 50 - 74% of the time - May be physically or verbally inappropriate.  Problem Solving Problem Solving: 3-Solves basic 50 - 74% of the time/requires cueing 25 - 49% of the time  Memory Memory: 3-Recognizes or recalls 50 - 74% of the time/requires cueing 25 - 49% of the time   Medical Problem List and Plan:  1. Right thalamic hemorrhage with hydrocephalus. IVC removed 08/04/2013  2. DVT Prophylaxis/Anticoagulation: Subcutaneous Lovenox for DVT prophylaxis initiated 08/02/2013. Monitor for any bleeding episodes  3. Pain Management: Ultram as needed.  4. Neuropsych: This patient is not yet capable of making decisions on her own behalf.  5. Hyperlipidemia. Lipitor  6. Proteus urinary tract infection. Bactrim initiated 07/30/2013 ---dc 7. Hypothyroidism. Synthroid as prior to admission.  8. Hypertension. No current antihypertensive meds. --normotensive to low. Patient on Prinizide 1 tab twice daily prior to admission. Will resume as needed 9. Renal insufficiency: encourage fluids-improved   LOS (Days) 5 A FACE TO FACE EVALUATION WAS PERFORMED  Zackaria Burkey T 08/09/2013 7:30 AM

## 2013-08-09 NOTE — Plan of Care (Signed)
Problem: RH SKIN INTEGRITY Goal: RH STG SKIN FREE OF INFECTION/BREAKDOWN No skin breakdown/infection while on rehab  Outcome: Progressing No evidence of skin breakdown

## 2013-08-09 NOTE — Progress Notes (Signed)
Physical Therapy Session Note  Patient Details  Name: Morgan Collins MRN: [id number] Date of Birth: 12-May-1956  Today's Date: 08/09/2013 Time: 0263-7858 Time Calculation (min): 58 min  Short Term Goals: Week 1:  PT Short Term Goal 1 (Week 1): STGs=LTGs secondary to LOS  Skilled Therapeutic Interventions/Progress Updates:    AM Session: Patient received semi-reclined in bed. Patient sits EOB to thread pant legs, then stands to pull pants up with supervision. Functional ambulation to/from bathroom with RW and supervision, performed all aspects of toileting with supervision. Gait training in controlled environment 120' x2 without AD and close supervision to intermittent min guard. NuStep Level 5 with B UE/LE x10'. Stair negotiation x5 steps with 1 rail (used as door frame to simulate brother's home environment, where patient plans to d/c) with supervision; alternating pattern. Standing balance activities: horseshoe toss 7 horseshoes x2 trials, patient required to reach to B sides to pick horseshoes up off of stool, then retrieve all horseshoes on ground, requires close supervision for task; standing alternating toe taps on 4 in step with min guard-minA for two mild LOB anteriorly secondary to poor L foot clearance when stepping. Patient returned to room and left seated in recliner with all needs within reach.  Therapy Documentation Precautions:  Precautions Precautions: Fall Restrictions Weight Bearing Restrictions: No Pain: Pain Assessment Pain Assessment: No/denies pain Pain Score: 0-No pain Locomotion : Ambulation Ambulation/Gait Assistance: 4: Min guard   See FIM for current functional status  Therapy/Group: Individual Therapy  Lillia Abed. Dameshia Seybold, PT, DPT 08/09/2013, 10:51 AM

## 2013-08-09 NOTE — Progress Notes (Signed)
Physical Therapy Note  Patient Details  Name: Morgan Collins MRN: [id number] Date of Birth: 28-Dec-1955 Today's Date: 08/09/2013  Patient missed 45 minutes of skilled physical therapy services this PM secondary to refusal to participate without medical reason. Patient received semi-reclined in bed, stating she "just got into bed." Unable to see patient at later time due to scheduled Neuropsych visit. Attempted to engage patient in bed-level exercise, but patient states "I'm just too worn out." Patient then states "I'll do whatever you think is best" and therapist encourages patient to participate in therapy session, to which patient continues to decline. Will follow up as able.  [place] Randalyn Ahmed, PT, DPT 08/09/2013, 1:19 PM

## 2013-08-09 NOTE — Plan of Care (Signed)
Problem: RH PAIN MANAGEMENT Goal: RH STG PAIN MANAGED AT OR BELOW PT'S PAIN GOAL <3 on a scale of 0-10  Outcome: Progressing No c/o pain

## 2013-08-09 NOTE — Progress Notes (Signed)
Occupational Therapy Session Note  Patient Details  Name: Morgan Collins MRN: [id number] Date of Birth: [birth year]  Today's Date: 08/09/2013 Time: 1000-1105 Time Calculation (min): 65 min  Short Term Goals: Week 1:  OT Short Term Goal 1 (Week 1): Pt will gather ADL items MIN (A) OT Short Term Goal 2 (Week 1): Pt will located 2 out 4 objects on left side with min cueing OT Short Term Goal 3 (Week 1): Pt will complete adls supervision level within set time frame established at beginning of session OT Short Term Goal 4 (Week 1): Pt will demonstrate sustained attention for 5 minutes during adls  Skilled Therapeutic Interventions/Progress Updates:     Pt was in recliner upon arrival.  Pt stood from recliner to sit in wc at sink with supervision A.  Pt bathed all UB/LB while seated at sink with supervision A.  Pt dressed all UB/LB clothing with supervision A and required more time to complete all tasks.  Pt required min safety cues to remember to lock breaks of wc before standing.  Pt had no c/o pain.  Focus on: Increasing independence in ADL performance, increasing activity tolerance and safety awareness.   Therapy Documentation Precautions:  Precautions Precautions: Fall Precaution Comments: 3 staples Rt side scalp Restrictions Weight Bearing Restrictions: No See FIM for current functional status  Therapy/Group: Individual Therapy  Scout Guyett 08/09/2013, 12:25 PM

## 2013-08-09 NOTE — Progress Notes (Signed)
Note reviewed and accurately reflects treatment session.   

## 2013-08-09 NOTE — Progress Notes (Signed)
Speech Language Pathology Daily Session Note  Patient Details  Name: Morgan Collins MRN: [id number] Date of Birth: 15-May-1956  Today's Date: 08/09/2013 Time: 0800-0840 Time Calculation (min): 40 min  Short Term Goals: Week 1: SLP Short Term Goal 1 (Week 1): Pt will sustain attention to a functional task for 5 minutes with Mod A verbal cues for redirection. SLP Short Term Goal 2 (Week 1): Pt will identify 2 cognitive deficits with Min A question and contextual cues.  SLP Short Term Goal 3 (Week 1): Pt will utilize call bell to request wants/needs with Min A question and verbal cues.  SLP Short Term Goal 4 (Week 1): Pt will demonstrate functional problem solving for basic and familiar tasks with Min A multimodal cues.  SLP Short Term Goal 5 (Week 1): Pt will attend to left field of enviornment/body during functional tasks with Min A verbal and question cues.  SLP Short Term Goal 6 (Week 1): Pt will utilize schedule to recall new, dialy information with Min A multimodal cueing  Skilled Therapeutic Interventions: Skilled treatment session focused on cognitive goals. SLP facilitated session by providing extra time and supervision question cues for redirection to a mildly complex scheduling task. Pt was overall Mod I for functional problem solving with the task but required supervision question cues for organization with the task and decreased ability to alternate attention throughout the task. Pt was overall less verbose today and recalled events from weekend with supervision question cues. Continue with current plan of care.    FIM:  Comprehension Comprehension Mode: Auditory Comprehension: 5-Understands complex 90% of the time/Cues < 10% of the time Expression Expression Mode: Verbal Expression: 5-Expresses basic needs/ideas: With extra time/assistive device Social Interaction Social Interaction: 5-Interacts appropriately 90% of the time - Needs monitoring or encouragement for  participation or interaction. Problem Solving Problem Solving: 5-Solves complex 90% of the time/cues < 10% of the time Memory Memory: 4-Recognizes or recalls 75 - 89% of the time/requires cueing 10 - 24% of the time  Pain Pain Assessment Pain Assessment: No/denies pain Pain Score: 0-No pain  Therapy/Group: Individual Therapy  Haeley [place], [place] 08/09/2013, 12:51 PM

## 2013-08-10 ENCOUNTER — Inpatient Hospital Stay (HOSPITAL_COMMUNITY): Payer: BC Managed Care – PPO

## 2013-08-10 ENCOUNTER — Inpatient Hospital Stay (HOSPITAL_COMMUNITY): Payer: BC Managed Care – PPO | Admitting: Speech Pathology

## 2013-08-10 ENCOUNTER — Inpatient Hospital Stay (HOSPITAL_COMMUNITY): Payer: BC Managed Care – PPO | Admitting: *Deleted

## 2013-08-10 NOTE — Progress Notes (Signed)
Speech Language Pathology Daily Session Note  Patient Details  Name: Morgan Collins MRN: [id number] Date of Birth: 06-08-1956  Today's Date: 08/10/2013 Time: 3536-1443 Time Calculation (min): 35 min  Short Term Goals: Week 1: SLP Short Term Goal 1 (Week 1): Pt will sustain attention to a functional task for 5 minutes with Mod A verbal cues for redirection. SLP Short Term Goal 2 (Week 1): Pt will identify 2 cognitive deficits with Min A question and contextual cues.  SLP Short Term Goal 3 (Week 1): Pt will utilize call bell to request wants/needs with Min A question and verbal cues.  SLP Short Term Goal 4 (Week 1): Pt will demonstrate functional problem solving for basic and familiar tasks with Min A multimodal cues.  SLP Short Term Goal 5 (Week 1): Pt will attend to left field of enviornment/body during functional tasks with Min A verbal and question cues.  SLP Short Term Goal 6 (Week 1): Pt will utilize schedule to recall new, dialy information with Min A multimodal cueing  Skilled Therapeutic Interventions: Skilled treatment session focused on cognitive goals. SLP facilitated session by providing supervision question cues for functional problem solving and organization with mildly complex, new learning task.  Pt was Mod I for recall of rules to new learning task. Pt recalled events from previous session with supervision question cues. Continue with current plan of care.    FIM:  Comprehension Comprehension Mode: Auditory Comprehension: 6-Follows complex conversation/direction: With extra time/assistive device Expression Expression Mode: Verbal Expression: 6-Expresses complex ideas: With extra time/assistive device Social Interaction Social Interaction: 6-Interacts appropriately with others with medication or extra time (anti-anxiety, antidepressant). Problem Solving Problem Solving: 6-Solves complex problems: With extra time Memory Memory: 5-Recognizes or recalls 90% of the  time/requires cueing < 10% of the time  Pain Pain Assessment Pain Assessment: No/denies pain Pain Score: 0-No pain  Therapy/Group: Individual Therapy  Charie Pinkus 08/10/2013, 2:35 PM

## 2013-08-10 NOTE — Progress Notes (Signed)
Physical Therapy Session Note  Patient Details  Name: Morgan Collins MRN: [id number] Date of Birth: May 09, 1956  Today's Date: 08/10/2013 Time: 1030-1130 and 1400-1430 Time Calculation (min): 60 min and 30 min  Short Term Goals: Week 1:  PT Short Term Goal 1 (Week 1): STGs=LTGs secondary to LOS  Skilled Therapeutic Interventions/Progress Updates:    AM Session: Patient received sitting in wheelchair at sink, brushing teeth. Session focused on gait training, stair negotiation, furniture transfers, floor transfers, and discharge planning. Discussion about energy conservation techniques and abilities to perform daily activities upon discharge. Gait training in controlled environment, home environment (in ADL apartment on carpet), and busy hospital hallways with RW and supervision, 150' x2. Patient noted to be externally distracted by others in hallways, but did not have LOB due to distraction, able to perform horizontal head turns without LOB. Furniture transfers to/from low cushioned surfaces with supervision. Gait training in controlled environment 90' x1 without AD and close supervision, increased lateral sway, decreased L step length, L clearance, and decreased gait speed. Stair negotiation with use of doorways for B UE support (simulated to be like STE brother's home from garage) x4 steps with supervision. Floor transfer with mat for B UE support and supervision. Discussion/education about indications vs. Contraindications for attempting floor transfer vs. Calling EMS. Patient able to teach back contraindications. Discussion about keeping phone in pocket at all times. Patient returned to room and left seated in recliner with all needs within reach.  PM Session: Patient received sitting in recliner. Session focused on gait training, car transfers, and activity tolerance. Gait training >150' x2 with RW and supervision. Car transfer to car of low height (patient will d/c home in [place]). NuStep Level  6 with B UE/LE x10' for increased activity tolerance. Patient left sitting in recliner with all needs within reach.  Therapy Documentation Precautions:  Precautions Precautions: Fall Restrictions Weight Bearing Restrictions: No Pain: Pain Assessment Pain Assessment: No/denies pain Pain Score: 0-No pain Locomotion : Ambulation Ambulation/Gait Assistance: 5: Supervision   See FIM for current functional status  Therapy/Group: Individual Therapy and Co-Treatment with Rec therapy for first half of session  Slinger. Brahm Barbeau, PT, DPT 08/10/2013, 12:19 PM

## 2013-08-10 NOTE — Progress Notes (Signed)
Occupational Therapy Session Note  Patient Details  Name: Morgan Collins MRN: [id number] Date of Birth: Sep 28, 1955  Today's Date: 08/10/2013 Time: 0930-1030 Time Calculation (min): 60 min  Short Term Goals: Week 1:  OT Short Term Goal 1 (Week 1): Pt will gather ADL items MIN (A) OT Short Term Goal 2 (Week 1): Pt will located 2 out 4 objects on left side with min cueing OT Short Term Goal 3 (Week 1): Pt will complete adls supervision level within set time frame established at beginning of session OT Short Term Goal 4 (Week 1): Pt will demonstrate sustained attention for 5 minutes during adls  Skilled Therapeutic Interventions/Progress Updates: ADL-retraining at shower level with focus on improved safety awareness, sustained attention, and dynamic standing balance during ADL.  Patient received sitting at edge of bed, clothing prepared for planned ADL session.  Patient completed tasks (bathing and dressing) without need for physical assist, using good safety awareness throughout session as evidenced by her announcing when she was ready to stand and when she needed assistance with setup of material.  Patient was re-educated on intent of supervision recommendation for falls prevention.    Therapy Documentation Precautions:  Precautions Precautions: Fall Precaution Comments: 3 staples Rt side scalp Restrictions Weight Bearing Restrictions: No  Pain: No report of pain   ADL: ADL ADL Comments: see FIM  See FIM for current functional status  Therapy/Group: Individual Therapy  Indyah Saulnier 08/10/2013, 10:31 AM

## 2013-08-10 NOTE — Progress Notes (Signed)
Social Work Patient ID: Morgan Collins, female   DOB: [birth year], 58 y.o.   MRN: [id number]  Met with pt following team conference.  Aware and agreeable with targeted d/c on 2/26.  Will discuss with brother to see if he wants to attend some tx sessions tomorrow.  Will also assist her with start of SSD application prior to her d/c.  Morgan Rossitto, LCSW

## 2013-08-10 NOTE — Progress Notes (Signed)
Subjective/Complaints: No new issues. Had a good day yesterday. Still has suture in scalp.  A 12 point review of systems has been performed and if not noted above is otherwise negative.   Objective: Vital Signs: Blood pressure 99/61, pulse 89, temperature 98.8 F (37.1 C), temperature source Oral, resp. rate 18, height 5\' 1"  (1.549 m), weight 97.5 kg (214 lb 15.2 oz), SpO2 97.00%. No results found. No results found for this basename: WBC, HGB, HCT, PLT,  in the last 72 hours  Recent Labs  08/07/13 0920  NA 135*  K 4.8  CL 101  GLUCOSE 172*  BUN 15  CREATININE 1.06  CALCIUM 11.2*   CBG (last 3)  No results found for this basename: GLUCAP,  in the last 72 hours  Wt Readings from Last 3 Encounters:  08/04/13 97.5 kg (214 lb 15.2 oz)  08/02/13 98.9 kg (218 lb 0.6 oz)    Physical Exam:  Constitutional: She appears well-developed.  HENT:  IVC drain site is clean and dry, staples in place, a little tender  Eyes: EOM are normal.  Neck: Normal range of motion. Neck supple. No thyromegaly present.  Cardiovascular: Normal rate and regular rhythm.  Respiratory: Effort normal and breath sounds normal. No respiratory distress.  GI: Soft. Bowel sounds are normal. She exhibits no distension.  Neurological: She is alert.  Mood is flat but appropriate. She makes good eye contact with examiner. She was able to state her name, age, date of birth in place. Very alert. Moves all 4's. Strength 5/5 grossly RUE and RLE and   4+ left bicep, tricep, deltoid, wrist, hand--  4+ left HF, KE, ankle.  Appears to have intact pain and light touch. Still with left inattention but doing a better job scanning to the left side. Skin: Skin is warm and dry. Suture/staples clean Psychiatric:  Cooperative. A little flat.    Assessment/Plan: 1. Functional deficits secondary to right thalamic hemorrhage which require 3+ hours per day of interdisciplinary therapy in a comprehensive inpatient rehab  setting. Physiatrist is providing close team supervision and 24 hour management of active medical problems listed below. Physiatrist and rehab team continue to assess barriers to discharge/monitor patient progress toward functional and medical goals. FIM: FIM - Bathing Bathing Steps Patient Completed: Chest;Right Arm;Left Arm;Abdomen;Front perineal area;Left lower leg (including foot);Right lower leg (including foot);Left upper leg;Right upper leg;Buttocks Bathing: 5: Set-up assist to: Obtain items  FIM - Upper Body Dressing/Undressing Upper body dressing/undressing steps patient completed: Thread/unthread right bra strap;Thread/unthread left bra strap;Hook/unhook bra;Thread/unthread right sleeve of pullover shirt/dresss;Thread/unthread left sleeve of pullover shirt/dress;Put head through opening of pull over shirt/dress;Pull shirt over trunk Upper body dressing/undressing: 5: Set-up assist to: Obtain clothing/put away FIM - Lower Body Dressing/Undressing Lower body dressing/undressing steps patient completed: Thread/unthread right underwear leg;Thread/unthread left underwear leg;Pull underwear up/down;Thread/unthread right pants leg;Thread/unthread left pants leg;Pull pants up/down;Don/Doff right sock;Don/Doff left sock Lower body dressing/undressing: 5: Set-up assist to: Obtain clothing  FIM - Toileting Toileting steps completed by patient: Adjust clothing prior to toileting;Performs perineal hygiene;Adjust clothing after toileting Toileting Assistive Devices: Grab bar or rail for support Toileting: 6: Assistive device: No helper  FIM - Radio producer Devices: Mining engineer Transfers: 4-To toilet/BSC: Min A (steadying Pt. > 75%);4-From toilet/BSC: Min A (steadying Pt. > 75%)  FIM - Bed/Chair Transfer Bed/Chair Transfer Assistive Devices: Arm rests;HOB elevated;Bed rails Bed/Chair Transfer: 5: Supine > Sit: Supervision (verbal cues/safety issues);4:  Chair or  W/C > Bed: Min A (steadying Pt. > 75%);4: Bed > Chair or W/C: Min A (steadying Pt. > 75%)  FIM - Locomotion: Wheelchair Distance: 80 Locomotion: Wheelchair: 0: Activity did not occur FIM - Locomotion: Ambulation Locomotion: Ambulation Assistive Devices: Other (comment) (no AD) Ambulation/Gait Assistance: 4: Min guard Locomotion: Ambulation: 2: Travels 50 - 149 ft with minimal assistance (Pt.>75%)  Comprehension Comprehension Mode: Auditory Comprehension: 5-Understands complex 90% of the time/Cues < 10% of the time  Expression Expression Mode: Verbal Expression: 5-Expresses basic needs/ideas: With no assist  Social Interaction Social Interaction: 5-Interacts appropriately 90% of the time - Needs monitoring or encouragement for participation or interaction.  Problem Solving Problem Solving: 5-Solves complex 90% of the time/cues < 10% of the time  Memory Memory: 5-Recognizes or recalls 90% of the time/requires cueing < 10% of the time   Medical Problem List and Plan:  1. Right thalamic hemorrhage with hydrocephalus. IVC removed 08/04/2013  2. DVT Prophylaxis/Anticoagulation: Subcutaneous Lovenox for DVT prophylaxis initiated 08/02/2013.  3. Pain Management: Ultram as needed.  4. Neuropsych: This patient is  capable of making decisions on her own behalf.  5. Hyperlipidemia. Lipitor  6. Proteus urinary tract infection. Bactrim initiated 07/30/2013 ---dc'ed 7. Hypothyroidism. Synthroid as prior to admission.  8. Hypertension. No current antihypertensive meds. --normotensive to low. Patient on Prinizide 1 tab twice daily prior to admission. Will resume as needed 9. Renal insufficiency: encourage fluids-improved   LOS (Days) 6 A FACE TO FACE EVALUATION WAS PERFORMED  Abimael Zeiter T 08/10/2013 7:46 AM

## 2013-08-10 NOTE — Patient Care Conference (Signed)
Inpatient RehabilitationTeam Conference and Plan of Care Update Date: 08/10/2013   Time: 3:04 PM    Patient Name: Morgan Collins      Medical Record Number: [id number]  Date of Birth: 1956/05/26 Sex: Female         Room/Bed: 4M03C/4M03C-01 Payor Info: Payor: [place] / Plan: [employer] [place] PPO / Product Type: *No Product type* /    Admitting Diagnosis: ICH  Admit Date/Time:  08/04/2013  4:15 PM Admission Comments: No comment available   Primary Diagnosis:  <principal problem not specified> Principal Problem: <principal problem not specified>  Patient Active Problem List   Diagnosis Date Noted  . Thalamic hemorrhage 08/04/2013  . Stroke 07/27/2013  . ICH (intracerebral hemorrhage) 07/27/2013  . Accelerated hypertension 07/27/2013  . Obstructive hydrocephalus 07/27/2013    Expected Discharge Date: Expected Discharge Date: 08/12/13  Team Members Present: Physician leading conference: Dr. Alger Simons Social Worker Present: Lennart Pall, LCSW Nurse Present: Janyth Pupa, RN PT Present: Melene Plan, Cottie Banda, PT OT Present: Forde Radon, OT;Roanna Epley, COTA SLP Present: Weston Anna, SLP PPS Coordinator present : Daiva Nakayama, RN, CRRN;Becky Alwyn Ren, PT     Current Status/Progress Goal Weekly Team Focus  Medical   right thalamic hemorrhage, balance, visual-attention issues  increase speed and scanning  bp management, FEN mgt   Bowel/Bladder   Continent of bowel and bladder. LBM: 08/08/2013  Patient to remain continent of B&B  Monitor   Swallow/Nutrition/ Hydration             ADL's   Mod I - Supervision for upper and lower body ADL, supervision for transfers and homemaking  Mod I toileting, bathing, dressing, Supervision transfers, homemaking, cognition  Dynamic sitting/standing balance, safety awareness, cognitive retraining   Mobility   S-minA overall  supervision overall  safety, bed mobility, transfers, gait, stairs, balance, activity  tolerance, coordination, L attention   Communication             Safety/Cognition/ Behavioral Observations  Supervision-Mod I  Supervision-Mod I  recall of newly learned information    Pain   No c/o pain  < 3  Monitor for nonverbal cues of pain   Skin   3 necrotic toes (1st, 2nd & 5th toe), L BKA dressing intact.  No infection to incision site; no new breakdown.   Educate on the importance of dressing change and skin care    Rehab Goals Patient on target to meet rehab goals: Yes *See Care Plan and progress notes for long and short-term goals.  Barriers to Discharge: slow, delayed process, attentional deficits    Possible Resolutions to Barriers:  supervision at home    Discharge Planning/Teaching Needs:  home with brother to provide any needed assistance  need to schedule with brother   Team Discussion:  Doing well overall and reaching supervision goals.  Pt able to direct and dictate all of her care needs to brother.  Ready for d/c this week.  Revisions to Treatment Plan:  None   Continued Need for Acute Rehabilitation Level of Care: The patient requires daily medical management by a physician with specialized training in physical medicine and rehabilitation for the following conditions: Daily direction of a multidisciplinary physical rehabilitation program to ensure safe treatment while eliciting the highest outcome that is of practical value to the patient.: Yes Daily medical management of patient stability for increased activity during participation in an intensive rehabilitation regime.: Yes Daily analysis of laboratory values and/or radiology reports with any subsequent  need for medication adjustment of medical intervention for : Neurological problems;Cardiac problems  Morgan Collins 08/10/2013, 5:04 PM

## 2013-08-10 NOTE — Consult Note (Signed)
NEUROCOGNITIVE TESTING - [facility]   MEDICAL NECESSITY:  Morgan Collins was seen on the [facility] [facility] for neurocognitive testing owing to the patient's diagnosis of stroke.   According to medical records, [name] was admitted to the rehab unit owing to "Functional deficits secondary to right thalamic hemorrhage with hydrocephalus s/p IVC." She has a history of hypertension and was admitted on 07/27/2013 with acute onset of headache and left-sided weakness. Cranial CT scan reportedly revealed a right thalamic hemorrhage with extension into lateral and third ventricles with mass effect on the third ventricle and signs of early ventricle enlargement indicating hydrocephalus. She underwent insertion of right frontal ventricular catheter for CSF drain on 07/28/2013. Follow-up MRI of the brain from 07/29/2013 reportedly showed no signs of hydrocephalus.   During today's visit, [name] denied experiencing any cognitive deficits post stroke. She purportedly has no recall of the stroke event. She described her current mood as "tired" and expressed wishes to return home as soon as possible. She admits to feeling depressed and anxious. She said that these symptoms were starting prior to the stroke owing to dissatisfaction at work and being overwhelmed in general. She has a history of treatment for depression in 01-Oct-2004 after her mother passed away and in the 1980s after she was medically ill. [name] believes that she has a good social support system. She mentioned seeing improvements with therapy.   The patient was referred for neuropsychological consultation given the possibility of cognitive sequelae subsequent to the current medical status and in order to assist in treatment planning.   PROCEDURES: [2 units of 03500 on 08/09/13]  Diagnostic Interview Medical record review Behavioral observations  Neuropsychological  testing  Repeatable Battery for the Assessment of Neuropsychological Status (form A)  Of note, one task was not administered due to certain physical limitations and testing circumstances.    TEST RESULTS:   RBANS Indices Scaled Score Percentile Description  Immediate Memory  57 <1 Markedly impaired  Visuospatial/Constructional 64 1 Markedly impaired  Language 87 19 Below average  Attention N/A N/A N/A  Delayed Memory 94 34 Average  Total Score N/A N/A N/A    RBANS Subtests Raw Score Percentile Description  List Learning 17 1 Markedly impaired  Story Memory 8 <1 Markedly impaired  Figure Copy 12 <1 Markedly impaired  Line Orientation 12 6 Borderline impaired   Picture Naming 10 75 Above average  Semantic Fluency 15 12 Below average  Digit Span 6 3 Impaired   Coding N/A N/A N/A  List Recall 4 16 Below average  List Recognition 19 32 Average  Story Recall 5 3 Impaired   Figure recall 13 42 Average   Cognitive Evaluation: Test results revealed reduced (if not impaired) functioning in several cognitive domains and thinking skills assessed with prominent issues observed related to immediate memory, attention, and contextual free recall.   Emotional & Behavioral Evaluation: [name] was appropriately dressed for season and situation. She was quiet and did not spontaneously generate a lot of conversation. Her speech was as expected and she was able to express ideas effectively. She seemed to understand test directions readily. Her affect was flat and she appeared quite depressed. Attention and motivation were adequate. Optimal test taking conditions were maintained.  From an emotional standpoint, [name] admitted to suffering from at least a moderate degree of depressive and anxiety-related symptoms that began prior to the stroke and have since been exacerbated by her present medical situation.  While she is ready to discharge, she denies any issues adjusting to her present  hospital stay. Suicidal/homicidal ideation, plan or intent was denied. No manic or hypomanic episodes were reported. The patient denied ever experiencing any auditory/visual hallucinations. No major behavioral or personality changes were endorsed.    Overall, [name] appears to be experiencing a good deal of cognitive and emotional sequelae post stroke; at the level of mild cognitive impairment (multi-domain). It is my hope that with time to recover, and amelioration of her psychiatric symptoms, that her cognition will improve. However, repeat testing as an outpatient is warranted to assess for interval change. In the meantime, the following recommendations are provided.    RECOMMENDATIONS  Recommendations for treatment team:    Since emotional factors are adversely impacting the patient's daily life, consider implementing an antidepressant.     There is conflicting evidence about the efficacy of using an antidepressant post stroke because while they can help with depression, they can also possibly increase fall risk and bleeding, as well as seizure and/or sedation. However, antidepressants can help stabilize the chemical imbalance, increased compliance of cerebrovascular disease preventing regimens, and may have an effect on serotonin-mediated platelet activation; all of which can increase survival rates.    Overall fluoxetine is reportedly a good choice. However, no one particular class is seemingly best.    Supportive psychotherapy may also be valuable during this admission and neuropsychology would be happy to follow-up with her   When interacting with [name], directions and information should be provided in a simple, straight forward manner, and the treatment team should avoid giving multiple instructions simultaneously.    She will benefit from being provided with multiple trials to learn new skills given the noted memory inefficiencies. In addition, she will greatly benefit from  recognition cueing, but be aware that she could be easily overwhelmed by too much information.    To the extent possible, multitasking should be avoided.   She requires more time than typical to process information. The treatment team may benefit from waiting for a verbal response to information before presenting additional information.    Be aware that she is suffering from mild visual spatial deficits of which she may not be aware. Taking this into consideration will help tailor therapy and keep accidents at [place] as much as possible when she is trying to navigate her surroundings.    Performance will generally be best in a structured, routine, and familiar environment, as opposed to situations involving complex problems.   Recommendations for discharge planning:    Complete a comprehensive neuropsychological evaluation as an outpatient in 8-12 months to assess for interval change. This can be done through Norton Pastel, PsyD by calling the following number: [phone].    Establish long-term follow-up care with a provider knowledgeable in stroke.    Maintain engagement in mentally, physically and cognitively stimulating activities.    Strive to maintain a healthy lifestyle (e.g., proper diet and exercise) in order to promote physical, cognitive and emotional health.    Due to the nature and severity of the symptoms noted during this evaluation, it is recommended that she initially obtain constant care and supervision following this hospitalization.    The patient should refrain from driving at this time.      Rutha Bouchard, Psy.D.  Clinical Neuropsychologist

## 2013-08-11 ENCOUNTER — Inpatient Hospital Stay (HOSPITAL_COMMUNITY): Payer: BC Managed Care – PPO | Admitting: *Deleted

## 2013-08-11 ENCOUNTER — Encounter (HOSPITAL_COMMUNITY): Payer: BC Managed Care – PPO | Admitting: Occupational Therapy

## 2013-08-11 ENCOUNTER — Inpatient Hospital Stay (HOSPITAL_COMMUNITY): Payer: BC Managed Care – PPO | Admitting: Speech Pathology

## 2013-08-11 MED ORDER — TRAMADOL HCL 50 MG PO TABS: 100.0000 mg | ORAL_TABLET | Freq: Four times a day (QID) | ORAL | Status: DC | PRN

## 2013-08-11 MED ORDER — ATORVASTATIN CALCIUM 10 MG PO TABS: 10.0000 mg | ORAL_TABLET | Freq: Every day | ORAL | Status: DC

## 2013-08-11 MED ORDER — LEVOTHYROXINE SODIUM 75 MCG PO TABS: 75.0000 ug | ORAL_TABLET | Freq: Every day | ORAL | Status: DC

## 2013-08-11 NOTE — Progress Notes (Signed)
Pt was discharged home. She was with his brother and Silvestre Mesi gave the discharge instruction

## 2013-08-11 NOTE — Progress Notes (Signed)
Social Work  Discharge Note  The overall goal for the admission was met for:   Discharge location: Yes - home with brother to assist  Length of Stay: Yes - 7 days  Discharge activity level: Yes - supervision to mod i overall  Home/community participation: Yes  Services provided included: MD, RD, PT, OT, SLP, RN, TR, Pharmacy, Neuropsych and SW  Financial Services: Private Insurance: [employer]  Follow-up services arranged: Outpatient: PT, ST via [facility], DME: rolling walker, 3n1 commode and tub bench via [place], Other: provided info on BI and Stroke support groups and Patient/Family has no preference for HH/DME agencies  Comments (or additional information):  Referred pt to [name] for new patient appoint  Patient/Family verbalized understanding of follow-up arrangements: Yes  Individual responsible for coordination of the follow-up plan: patient  Confirmed correct DME delivered: [name] 08/11/2013    [name]

## 2013-08-11 NOTE — Progress Notes (Signed)
Physical Therapy Discharge Summary  Patient Details  Name: Morgan Collins MRN: [id number] Date of Birth: [birth year]  Today's Date: 08/11/2013 Time: 0900-0930 and 1300-1330 Time Calculation (min): 30 min and 30 min  Patient has met 12 of 12 long term goals due to improved activity tolerance, improved balance, improved postural control, increased strength, ability to compensate for deficits, functional use of  left upper extremity and left lower extremity, improved attention, improved awareness and improved coordination.  Patient to discharge at an ambulatory level Supervision.   Patient's care partner is able to provide the necessary supervision level of assistance at discharge. Discussion/education/observation session with patient's brother, who's house she will be staying at. Patient's brother verbalized understanding of supervision level of assistance for community ambulation and stair negotiation.  Reasons goals not met: N/A, all LTGs met or exceeded.  Recommendation:  Patient will benefit from ongoing skilled PT services in outpatient setting to continue to advance safe functional mobility, address ongoing impairments in strength, balance, coordination, gait, and minimize fall risk.  Equipment: RW  Reasons for discharge: treatment goals met and discharge from hospital  Patient/family agrees with progress made and goals achieved: Yes  Patient missed final 30 minutes of PM PT session secondary to refusal to participate, citing "wanting to pack her things up and get organized before she left" as reason to end therapy session prematurely. Patient provided with [facility] handout.  PT Discharge Precautions/Restrictions Precautions Precautions: Fall Restrictions Weight Bearing Restrictions: No Pain Pain Assessment Pain Assessment: No/denies pain Vision/Perception  Vision - History Baseline Vision: Wears glasses only for reading Patient Visual Report: No change from  baseline Praxis Praxis: Intact  Cognition Overall Cognitive Status: Impaired/Different from baseline Arousal/Alertness: Awake/alert Orientation Level: Oriented X4 Attention: Selective Sustained Attention: Appears intact (for tasks required during PT) Sustained Attention Impairment: Functional basic Selective Attention: Impaired Selective Attention Impairment: Verbal basic;Functional basic Memory: Impaired Memory Impairment: Decreased short term memory Decreased Short Term Memory: Functional complex;Verbal complex Awareness: Impaired Awareness Impairment: Anticipatory impairment Problem Solving: Impaired Problem Solving Impairment: Verbal basic;Functional basic Safety/Judgment: Appears intact Sensation Sensation Light Touch: Appears Intact Proprioception: Appears Intact Coordination Gross Motor Movements are Fluid and Coordinated: Yes Fine Motor Movements are Fluid and Coordinated: Yes Motor  Motor Motor: Within Functional Limits Motor - Discharge Observations: improved strength; very mild hemiplegia on L side  Mobility Bed Mobility Bed Mobility: Supine to Sit;Sit to Supine Supine to Sit: 6: Modified independent (Device/Increase time) (Simultaneous filing. User may not have seen previous data.) Supine to Sit Details:  (verbal cue to initiate) Sit to Supine: 6: Modified independent (Device/Increase time);HOB flat Transfers Transfers: Yes (Simultaneous filing. User may not have seen previous data.) Sit to Stand: 6: Modified independent (Device/Increase time);With armrests;From bed;From chair/3-in-1;With upper extremity assist Stand to Sit: 6: Modified independent (Device/Increase time);To chair/3-in-1;To bed;With upper extremity assist;With armrests Stand Pivot Transfers: 6: Modified independent (Device/Increase time);With armrests Locomotion  Ambulation Ambulation: Yes Ambulation/Gait Assistance: 5: Supervision Ambulation Distance (Feet): 150 Feet Assistive device:  Rolling walker Ambulation/Gait Assistance Details: Verbal cues for safe use of DME/AE Ambulation/Gait Assistance Details: Gait training in controlled, home and community environments >150' several times with supervision in community settings secondary to cues to maintain RW on floor during turns, mod I in controlled and home environments. Gait Gait: Yes Gait Pattern: Within Functional Limits Gait Pattern: Step-through pattern;Decreased stride length Stairs / Additional Locomotion Stairs: Yes Stairs Assistance: 5: Supervision Stairs Assistance Details: Verbal cues for precautions/safety Stair Management Technique: No rails;Step to pattern;Forwards Number of  Stairs: 5 Height of Stairs: 6 Wheelchair Mobility Wheelchair Mobility: No (ambulation is patient's primary means of locomotion)  Trunk/Postural Assessment  Cervical Assessment Cervical Assessment: Exceptions to [facility] (forward head posture) Thoracic Assessment Thoracic Assessment: Within Functional Limits Lumbar Assessment Lumbar Assessment: Within Functional Limits Postural Control Postural Control: Deficits on evaluation Righting Reactions: delayed Protective Responses: delayed  Balance Balance Balance Assessed: Yes Standardized Balance Assessment Standardized Balance Assessment: Berg Balance Test Berg Balance Test Sit to Stand: Able to stand  independently using hands Standing Unsupported: Able to stand safely 2 minutes Sitting with Back Unsupported but Feet Supported on Floor or Stool: Able to sit safely and securely 2 minutes Stand to Sit: Sits safely with minimal use of hands Transfers: Able to transfer safely, definite need of hands Standing Unsupported with Eyes Closed: Able to stand 10 seconds safely Standing Ubsupported with Feet Together: Able to place feet together independently and stand 1 minute safely From Standing, Reach Forward with Outstretched Arm: Can reach confidently >25 cm (10") From Standing Position,  Pick up Object from Floor: Able to pick up shoe safely and easily From Standing Position, Turn to Look Behind Over each Shoulder: Looks behind from both sides and weight shifts well Turn 360 Degrees: Able to turn 360 degrees safely in 4 seconds or less Standing Unsupported, Alternately Place Feet on Step/Stool: Able to stand independently and safely and complete 8 steps in 20 seconds Standing Unsupported, One Foot in Front: Able to plae foot ahead of the other independently and hold 30 seconds Standing on One Leg: Tries to lift leg/unable to hold 3 seconds but remains standing independently Total Score: 50/56, indicating patient is at a moderate risk for falls (>50%). Extremity Assessment  RLE Assessment RLE Assessment: Exceptions to [facility] RLE Strength RLE Overall Strength: Deficits RLE Overall Strength Comments: Generalized weakness; grossly 3+/5 LLE Assessment LLE Assessment: Exceptions to [facility] LLE Strength LLE Overall Strength: Deficits LLE Overall Strength Comments: Generalized weakness; grossly 3+/5  See FIM for current functional status  Danah Reinecke S Laparis Durrett S. Jamarion [place], PT, DPT  08/11/2013, 1:54 PM

## 2013-08-11 NOTE — Discharge Summary (Signed)
Discharge summary job # [id number]

## 2013-08-11 NOTE — Plan of Care (Signed)
Problem: RH Wheelchair Mobility Goal: LTG Patient will propel w/c in controlled environment (PT) LTG: Patient will propel wheelchair in controlled environment, # of feet with assist (PT)  Outcome: Not Applicable Date Met:  09/31/12 Primary means of locomotion is ambulation.

## 2013-08-11 NOTE — Discharge Instructions (Signed)
Inpatient Rehab Discharge Instructions  Morgan Collins Discharge date and time: No discharge date for patient encounter.   Activities/Precautions/ Functional Status: Activity: activity as tolerated Diet: regular diet Wound Care: none needed Functional status:  ___ No restrictions     ___ Walk up steps independently ___ 24/7 supervision/assistance   ___ Walk up steps with assistance ___ Intermittent supervision/assistance  ___ Bathe/dress independently ___ Walk with walker     ___ Bathe/dress with assistance ___ Walk Independently    ___ Shower independently _x__ Walk with assistance    ___ Shower with assistance ___ No alcohol     ___ Return to work/school ________    COMMUNITY REFERRALS UPON DISCHARGE:    Outpatient: PT      ST                 Agency: [facility]    Phone: [phone]              Appointment Date/Time:  08/16/13 10:15 am - 11:45 am  (please arrive at 9:45)  Medical Equipment/Items Ordered: rolling walker, 3n1 commode and tub bench                                                     Agency/Supplier: [facility] @ [phone]   GENERAL COMMUNITY RESOURCES FOR PATIENT/FAMILY:  Support Groups:  Stroke or Brain Injury Groups (handouts provided) Employment Assistance: Rehab SW submitting SSD application on your behalf to start process  Caregiver Support: same as above      Special Instructions:    My questions have been answered and I understand these instructions. I will adhere to these goals and the provided educational materials after my discharge from the hospital.  Patient/Caregiver Signature _______________________________ Date __________  Clinician Signature _______________________________________ Date __________  Please bring this form and your medication list with you to all your follow-up doctor's appointments.  STROKE/TIA DISCHARGE INSTRUCTIONS SMOKING Cigarette smoking nearly doubles your risk of having a stroke & is the single most  alterable risk factor  If you smoke or have smoked in the last 12 months, you are advised to quit smoking for your health.  Most of the excess cardiovascular risk related to smoking disappears within a year of stopping.  Ask you doctor about anti-smoking medications  [place] Quit Line: 1-800-QUIT NOW  Free Smoking Cessation Classes [phone]  CHOLESTEROL Know your levels; limit fat & cholesterol in your diet  Lipid Panel     Component Value Date/Time   CHOL 343* 08/02/2013 0250   TRIG 97 08/02/2013 0250   HDL 61 08/02/2013 0250   CHOLHDL 5.6 08/02/2013 0250   VLDL 19 08/02/2013 0250   LDLCALC 263* 08/02/2013 0250      Many patients benefit from treatment even if their cholesterol is at goal.  Goal: Total Cholesterol (CHOL) less than 160  Goal:  Triglycerides (TRIG) less than 150  Goal:  HDL greater than 40  Goal:  LDL (LDLCALC) less than 100   BLOOD PRESSURE American Stroke Association blood pressure target is less that 120/80 mm/Hg  Your discharge blood pressure is:  BP: 101/72 mmHg  Monitor your blood pressure  Limit your salt and alcohol intake  Many individuals will require more than one medication for high blood pressure  DIABETES (A1c is a blood sugar average for last 3  months) Goal HGBA1c is under 7% (HBGA1c is blood sugar average for last 3 months)  Diabetes: No known diagnosis of diabetes    No results found for this basename: HGBA1C     Your HGBA1c can be lowered with medications, healthy diet, and exercise.  Check your blood sugar as directed by your physician  Call your physician if you experience unexplained or low blood sugars.  PHYSICAL ACTIVITY/REHABILITATION Goal is 30 minutes at least 4 days per week  Activity: No restrictions. Therapies: Physical Therapy: Home Health Return to work:   Activity decreases your risk of heart attack and stroke and makes your heart stronger.  It helps control your weight and blood pressure; helps you relax and can improve  your mood.  Participate in a regular exercise program.  Talk with your doctor about the best form of exercise for you (dancing, walking, swimming, cycling).  DIET/WEIGHT Goal is to maintain a healthy weight  Your discharge diet is: Cardiac  liquids Your height is:  Height: 5\' 1"  (154.9 cm) Your current weight is: Weight: 97.5 kg (214 lb 15.2 oz) Your Body Mass Index (BMI) is:  BMI (Calculated): 40.7  Following the type of diet specifically designed for you will help prevent another stroke.  Your goal weight range is:    Your goal Body Mass Index (BMI) is 19-24.  Healthy food habits can help reduce 3 risk factors for stroke:  High cholesterol, hypertension, and excess weight.  RESOURCES Stroke/Support Group:  Call [phone]   STROKE EDUCATION PROVIDED/REVIEWED AND GIVEN TO PATIENT Stroke warning signs and symptoms How to activate emergency medical system (call 911). Medications prescribed at discharge. Need for follow-up after discharge. Personal risk factors for stroke. Pneumonia vaccine given:  Flu vaccine given:  My questions have been answered, the writing is legible, and I understand these instructions.  I will adhere to these goals & educational materials that have been provided to me after my discharge from the hospital.

## 2013-08-11 NOTE — Progress Notes (Signed)
Occupational Therapy Discharge Summary  Patient Details  Name: Morgan Collins MRN: [id number] Date of Birth: Jun 03, 1956  Today's Date: 08/11/2013 Time: 0700-0800 Time Calculation (min): 60 min  Patient has met 7 of 7 long term goals due to improved activity tolerance, improved balance, postural control and improved attention.  Patient to discharge at overall Modified Independent level.  Patient's care partner is independent to provide the necessary ADL (A) assistance at discharge.    Pt supine on arrival and agreeable to demonstrate shower this session to simulate brother's bathroom setup. Pt needed verbal cues throughout session to manage time during adl. Pt educated on time frame together and pending SLP session. Pt still dressing on SLP arrival due to poor time management skills due to difficulty initiating task. Pt attention to left side improved and retrieving all adl items needed for session. Pt met all her goals and will have family support upon d/c home. NO family present during session so no family education completed for OT.   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  Equipment: 3n1- to allow transfer with bil Ue support tub bench- brother's home has a tub /s hower combo  RW- due to balance deficits at community level  Reasons for discharge: treatment goals met and discharge from hospital  Patient/family agrees with progress made and goals achieved: Yes  OT Discharge Precautions/Restrictions  Precautions Precautions: Fall Restrictions Weight Bearing Restrictions: No General   Vital Signs  monitored and stable Pain Pain Assessment Pain Assessment: No/denies pain ADL ADL ADL Comments: see FIM Vision/Perception  Vision - History Baseline Vision: Wears glasses only for reading Patient Visual Report: No change from baseline Vision -  Assessment Eye Alignment: Within Functional Limits Perception Perception: Impaired Inattention/Neglect: Other (comment) (improved tracking toward the left) Praxis Praxis: Intact  Cognition Arousal/Alertness: Awake/alert Orientation Level: Oriented X4 Attention: Selective Sustained Attention: Impaired Sustained Attention Impairment: Functional basic Selective Attention: Impaired Selective Attention Impairment: Verbal basic;Functional basic Memory: Impaired Memory Impairment: Decreased short term memory Decreased Short Term Memory: Functional complex;Verbal complex Behaviors: Other (comment) (decr initiation to all task) Safety/Judgment: Impaired Comments: Pt attempting to pick up RW and needs cues to keep RW on the ground. Unable to problem solve narrow space.  Sensation Sensation Light Touch: Appears Intact Hot/Cold: Appears Intact Proprioception: Appears Intact Coordination Gross Motor Movements are Fluid and Coordinated: Yes Fine Motor Movements are Fluid and Coordinated: Yes Motor  Motor Motor: Within Functional Limits Mobility  Bed Mobility Bed Mobility: Supine to Sit;Sit to Supine Supine to Sit: 6: Modified independent (Device/Increase time) (Simultaneous filing. User may not have seen previous data.) Supine to Sit Details:  (verbal cue to initiate) Sit to Supine: 6: Modified independent (Device/Increase time);HOB flat Transfers Sit to Stand: 6: Modified independent (Device/Increase time);With armrests;From bed;From chair/3-in-1;With upper extremity assist Stand to Sit: 6: Modified independent (Device/Increase time);To chair/3-in-1;To bed;With upper extremity assist;With armrests  Trunk/Postural Assessment  Cervical Assessment Cervical Assessment: Exceptions to [facility] Thoracic Assessment Thoracic Assessment: Within Functional Limits Lumbar Assessment Lumbar Assessment: Within Functional Limits Postural Control Postural Control: Within Functional Limits   Balance Balance Balance Assessed: Yes Standardized Balance Assessment Standardized Balance Assessment: Berg Balance Test Berg Balance Test Sit to Stand: Able to stand  independently using hands Standing Unsupported: Able to stand safely 2 minutes Sitting with Back Unsupported but Feet Supported on Floor or Stool: Able to sit safely and securely 2 minutes  Stand to Sit: Sits safely with minimal use of hands Transfers: Able to transfer safely, definite need of hands Standing Unsupported with Eyes Closed: Able to stand 10 seconds safely Standing Ubsupported with Feet Together: Able to place feet together independently and stand 1 minute safely From Standing, Reach Forward with Outstretched Arm: Can reach confidently >25 cm (10") From Standing Position, Pick up Object from Floor: Able to pick up shoe safely and easily From Standing Position, Turn to Look Behind Over each Shoulder: Looks behind from both sides and weight shifts well Turn 360 Degrees: Able to turn 360 degrees safely in 4 seconds or less Standing Unsupported, Alternately Place Feet on Step/Stool: Able to stand independently and safely and complete 8 steps in 20 seconds Standing Unsupported, One Foot in Front: Able to plae foot ahead of the other independently and hold 30 seconds Standing on One Leg: Tries to lift leg/unable to hold 3 seconds but remains standing independently Total Score: 50 Static Sitting Balance Static Sitting - Balance Support: No upper extremity supported;Feet supported Static Sitting - Level of Assistance: 6: Modified independent (Device/Increase time) Dynamic Sitting Balance Dynamic Sitting - Balance Support: No upper extremity supported;Feet supported Dynamic Sitting - Level of Assistance: 6: Modified independent (Device/Increase time) Sitting balance - Comments: donning shoes Static Standing Balance Static Standing - Balance Support: No upper extremity supported;Bilateral upper extremity  supported;During functional activity Static Standing - Level of Assistance: 6: Modified independent (Device/Increase time) Extremity/Trunk Assessment RUE Assessment RUE Assessment: Within Functional Limits LUE Assessment LUE Assessment: Within Functional Limits  See FIM for current functional status  Peri Maris 08/11/2013, 12:25 PM Pager: [phone]

## 2013-08-11 NOTE — Discharge Summary (Signed)
NAMECONSUELLO, LASSALLE NO.:  [id number]  MEDICAL RECORD NO.:  [id number]  LOCATION:  4M03C                        FACILITY:  [facility]  PHYSICIAN:  Meredith Staggers, M.D.DATE OF BIRTH:  Apr 21, 1956  DATE OF ADMISSION:  08/04/2013 DATE OF DISCHARGE; 08/11/2013                              DISCHARGE SUMMARY   DISCHARGE DIAGNOSES: 1. Right thalamic hemorrhage with hydrocephalus. 2. Subcutaneous Lovenox for DVT prophylaxis. 3. Pain management. 4. Hyperlipidemia. 5. Proteus urinary tract infection, resolved. 6. Hypothyroidism. 7. Hypertension. 8. Renal insufficiency.  HISTORY OF PRESENT ILLNESS:  This is a 58 year old right-handed female, history of hypertension, admitted on July 27, 2013, with acute onset of headache and left-sided weakness.  Cranial CT scan showed right thalamic hemorrhage with extension into lateral and third ventricles with mass effect on the third ventricle and signs of early ventricle enlargement indicative of early hydrocephalus.  Underwent insertion of right frontal ventricular catheter for CSF drain on July 28, 2013, per Dr. Joya Salm.  Followup MRI of the brain on July 29, 2013, showed no signs of hydrocephalus.  Her IVC drain was removed on August 04, 2013.  HOSPITAL COURSE:  Proteus urinary tract infection, maintained on Bactrim.  Subcutaneous Lovenox for DVT prophylaxis, added on August 02, 2013.  Tolerating a regular diet.  Physical and occupational therapy ongoing.  The patient was admitted for comprehensive rehab program.  PAST MEDICAL HISTORY:  See discharge diagnoses.  SOCIAL HISTORY:  Lives along with supportive family.  FUNCTIONAL HISTORY:  Prior to admission, independent, working full time. Functional status upon admission to rehab services was ambulating 90 feet, decreased gait velocity, max assist for lower body dressing, and minimal assist for upper body dressing.  PHYSICAL EXAMINATION:  VITAL SIGNS:  Blood  pressure 102/69, pulse 78, temperature 98.2, and respirations 18. GENERAL:  This was an alert female, in no acute distress, IVC drain site clean and dry.  Pupils round and reactive to light. LUNGS:  Clear to auscultation. CARDIAC:  Regular rate and rhythm. ABDOMEN:  Soft and nontender.  Good bowel sounds. PSYCH:  Mood was flat but appropriate.  She made good eye contact with examiner.  REHABILITATION HOSPITAL COURSE:  The patient was admitted to inpatient rehab services with therapies initiated on a 3-hour daily basis consisting of physical therapy, occupational therapy, speech therapy, and rehabilitation nursing.  The following issues were addressed during the patient's rehabilitation stay.  Pertaining to Ms. Andreasen' right thalamic hemorrhage with hydrocephalus, remained stable.  IVC had been removed on August 04, 2013.  She would follow up with Neurosurgery. Subcutaneous Lovenox initiated on August 02, 2013, throughout her rehab course, no bleeding episodes, discontinued at the time of discharge.  She was using Ultram on limited basis for pain.  She had completed a course of Bactrim for Proteus urinary tract infection with no dysuria or hematuria.  She remained on Synthroid as prior to admission for hypothyroidism.  Blood pressure was well controlled.  No current antihypertensive medication.  Blood pressure was normotensive to low.  The patient had been on Prinzide prior to admission and may need to resume.  There was consideration of resuming this after her discharge.  The patient received weekly collaborative interdisciplinary team  conferences to discuss estimated length of stay, family teaching, and any barriers to discharge.  Focus on gait training, stair negotiation, furniture transfers, and floor transfers, ambulating 150 feet x2, rolling walker and supervision.  Stair negotiation with the use of doorways for bilateral upper extremity support, again with supervision.   Monitoring of safety awareness, sustained attention, and dynamic standing balance.  Full family teaching was completed.  Plan was to be discharge to home.  DISCHARGE MEDICATIONS:  Included Synthroid 75 mcg p.o. daily, Lipitor 10 mg p.o. daily, Ultram 100 mg p.o. every 6 hours as needed for moderate pain.  DIET:  Regular.  SPECIAL INSTRUCTIONS:  The patient would follow up with Dr. Alger Simons at the outpatient rehab service office as advised.  Dr. Antony Contras, Neurology Service, one month call for appointment.  Arrangements to be made to follow up with PCP per Case Management.  Consideration of resuming back home Prinzide.     Lauraine Rinne, P.A.   ______________________________ Meredith Staggers, M.D.    DA/MEDQ  D:  08/11/2013  T:  08/11/2013  Job:  [id number]  cc:   Pramod P. Leonie Man, MD

## 2013-08-11 NOTE — Progress Notes (Signed)
Subjective/Complaints: No new problems. Denies pain, headaches A 12 point review of systems has been performed and if not noted above is otherwise negative.   Objective: Vital Signs: Blood pressure 101/72, pulse 91, temperature 98.6 F (37 C), temperature source Oral, resp. rate 17, height 5\' 1"  (1.549 m), weight 97.5 kg (214 lb 15.2 oz), SpO2 95.00%. No results found. No results found for this basename: WBC, HGB, HCT, PLT,  in the last 72 hours No results found for this basename: NA, K, CL, CO, GLUCOSE, BUN, CREATININE, CALCIUM,  in the last 72 hours CBG (last 3)  No results found for this basename: GLUCAP,  in the last 72 hours  Wt Readings from Last 3 Encounters:  08/04/13 97.5 kg (214 lb 15.2 oz)  08/02/13 98.9 kg (218 lb 0.6 oz)    Physical Exam:  Constitutional: She appears well-developed.  HENT:  IVC drain site is clean and dry, staples in place, a little tender  Eyes: EOM are normal.  Neck: Normal range of motion. Neck supple. No thyromegaly present.  Cardiovascular: Normal rate and regular rhythm.  Respiratory: Effort normal and breath sounds normal. No respiratory distress.  GI: Soft. Bowel sounds are normal. She exhibits no distension.  Neurological: She is alert.  Mood is flat but appropriate. She makes good eye contact with examiner. She was able to state her name, age, date of birth in place. Very alert. Moves all 4's. Strength 5/5 grossly RUE and RLE and   4+ left bicep, tricep, deltoid, wrist, hand--  4+ left HF, KE, ankle.  Appears to have intact pain and light touch. Still with left inattention but doing a better job scanning to the left side. Skin: Skin is warm and dry. Scalp incision clean Psychiatric:  Cooperative. A little flat.    Assessment/Plan: 1. Functional deficits secondary to right thalamic hemorrhage which require 3+ hours per day of interdisciplinary therapy in a comprehensive inpatient rehab setting. Physiatrist is providing close  team supervision and 24 hour management of active medical problems listed below. Physiatrist and rehab team continue to assess barriers to discharge/monitor patient progress toward functional and medical goals.  On track for dc tomorrow. Still slow with processing, but nearing goals.   FIM: FIM - Bathing Bathing Steps Patient Completed: Chest;Right Arm;Left Arm;Abdomen;Front perineal area;Buttocks;Right upper leg;Left upper leg;Right lower leg (including foot);Left lower leg (including foot) Bathing: 6: More than reasonable amount of time  FIM - Upper Body Dressing/Undressing Upper body dressing/undressing steps patient completed: Thread/unthread right bra strap;Thread/unthread left bra strap;Hook/unhook bra;Thread/unthread right sleeve of pullover shirt/dresss;Thread/unthread left sleeve of pullover shirt/dress;Put head through opening of pull over shirt/dress;Pull shirt over trunk Upper body dressing/undressing: 7: Complete Independence: No helper FIM - Lower Body Dressing/Undressing Lower body dressing/undressing steps patient completed: Thread/unthread right underwear leg;Thread/unthread left underwear leg;Pull underwear up/down;Thread/unthread right pants leg;Thread/unthread left pants leg;Pull pants up/down;Don/Doff right sock;Don/Doff left sock;Don/Doff right shoe;Don/Doff left shoe Lower body dressing/undressing: 6: More than reasonable amount of time  FIM - Toileting Toileting steps completed by patient: Adjust clothing prior to toileting;Performs perineal hygiene;Adjust clothing after toileting Toileting Assistive Devices: Grab bar or rail for support Toileting: 6: Assistive device: No helper  FIM - Radio producer Devices: Mining engineer Transfers: 4-To toilet/BSC: Min A (steadying Pt. > 75%);4-From toilet/BSC: Min A (steadying Pt. > 75%)  FIM - Bed/Chair Transfer Bed/Chair Transfer Assistive Devices: Arm rests;Walker Bed/Chair Transfer: 5:  Chair or W/C > Bed: Supervision (verbal cues/safety  issues);5: Bed > Chair or W/C: Supervision (verbal cues/safety issues)  FIM - Locomotion: Wheelchair Distance: 80 Locomotion: Wheelchair: 0: Activity did not occur FIM - Locomotion: Ambulation Locomotion: Ambulation Assistive Devices: Administrator Ambulation/Gait Assistance: 5: Supervision Locomotion: Ambulation: 5: Travels 150 ft or more with supervision/safety issues  Comprehension Comprehension Mode: Auditory Comprehension: 6-Follows complex conversation/direction: With extra time/assistive device  Expression Expression Mode: Verbal Expression: 6-Expresses complex ideas: With extra time/assistive device  Social Interaction Social Interaction: 6-Interacts appropriately with others with medication or extra time (anti-anxiety, antidepressant).  Problem Solving Problem Solving: 6-Solves complex problems: With extra time  Memory Memory: 5-Recognizes or recalls 90% of the time/requires cueing < 10% of the time   Medical Problem List and Plan:  1. Right thalamic hemorrhage with hydrocephalus. IVC removed 08/04/2013  2. DVT Prophylaxis/Anticoagulation: Subcutaneous Lovenox for DVT prophylaxis initiated 08/02/2013.  3. Pain Management: Ultram as needed.  4. Neuropsych: This patient is  capable of making decisions on her own behalf.  5. Hyperlipidemia. Lipitor  6. Proteus urinary tract infection. Bactrim completed 7. Hypothyroidism. Synthroid as prior to admission.  8. Hypertension. No current antihypertensive meds. --normotensive to low. Patient on Prinizide 1 tab twice daily prior to admission.  May need to resume after dc 9. Renal insufficiency: encourage fluids-improved   LOS (Days) 7 A FACE TO FACE EVALUATION WAS PERFORMED  Kresha Abelson T 08/11/2013 7:48 AM

## 2013-08-11 NOTE — Plan of Care (Signed)
Problem: RH Bed Mobility Goal: LTG Patient will perform bed mobility with assist (PT) LTG: Patient will perform bed mobility with assistance, with/without cues (PT).  Outcome: Completed/Met Date Met:  08/11/13 Exceeded goal as patient's CLOF is mod I.  Problem: RH Bed to Chair Transfers Goal: LTG Patient will perform bed/chair transfers w/assist (PT) LTG: Patient will perform bed/chair transfers with assistance, with/without cues (PT).  Outcome: Completed/Met Date Met:  08/11/13 Exceeded goal as patient's CLOF is mod I.  Problem: RH Ambulation Goal: LTG Patient will ambulate in controlled environment (PT) LTG: Patient will ambulate in a controlled environment, # of feet with assistance (PT).  Outcome: Completed/Met Date Met:  08/11/13 Exceeded goal as patient's CLOF is mod I. Goal: LTG Patient will ambulate in home environment (PT) LTG: Patient will ambulate in home environment, # of feet with assistance (PT).  Outcome: Completed/Met Date Met:  08/11/13 Exceeded goal as patient's CLOF is mod I.

## 2013-08-11 NOTE — Progress Notes (Signed)
Speech Language Pathology Session Note & Discharge Summary  Patient Details  Name: Morgan Collins MRN: [id number] Date of Birth: 11/01/1955  Today's Date: 08/11/2013 Time: 0800-0845 Time Calculation (min): 45 min  Skilled Therapeutic Interventions:  Treatment focus on patient and family education in regards to pt's current cognitive function and strategies to utilize at home to increase working memory, Surveyor, mining and functional problem solving. Patient and her brother verbalized understanding of all information and handouts given to reinforce education. Pt will discharge home today with 24 hour supervision.     Patient has met 5 of 5 long term goals.  Patient to discharge at overall Supervision level.   Reasons goals not met: N/A   Clinical Impression/Discharge Summary: Pt has made functional gains and has met 5 of 5 LTG's this admission due to increased attention, awareness, problem solving, safety awareness and working memory. Pt will discharge at an overall supervision level. Pt/family education complete and pt will discharge home with 24 hour supervision. Recommend f/u outpatient SLP services to maximize cognitive function and overall functional independence.   Care Partner:  Caregiver Able to Provide Assistance: Yes  Type of Caregiver Assistance: Physical;Cognitive  Recommendation:  24 hour supervision/assistance;Outpatient SLP  Rationale for SLP Follow Up: Maximize cognitive function and independence;Reduce caregiver burden   Equipment: N/A  Reasons for discharge: Treatment goals met;Discharged from hospital   Patient/Family Agrees with Progress Made and Goals Achieved: Yes   See FIM for current functional status  Morgan Collins 08/11/2013, 4:10 PM

## 2013-08-16 ENCOUNTER — Ambulatory Visit: Payer: BC Managed Care – PPO

## 2013-08-16 ENCOUNTER — Ambulatory Visit: Payer: BC Managed Care – PPO | Attending: Physical Medicine & Rehabilitation | Admitting: Physical Therapy

## 2013-08-16 DIAGNOSIS — M6281 Muscle weakness (generalized): Secondary | ICD-10-CM | POA: Insufficient documentation

## 2013-08-16 DIAGNOSIS — R41841 Cognitive communication deficit: Secondary | ICD-10-CM | POA: Insufficient documentation

## 2013-08-16 DIAGNOSIS — R269 Unspecified abnormalities of gait and mobility: Secondary | ICD-10-CM | POA: Insufficient documentation

## 2013-08-16 DIAGNOSIS — Z5189 Encounter for other specified aftercare: Secondary | ICD-10-CM | POA: Insufficient documentation

## 2013-08-18 ENCOUNTER — Encounter: Payer: Self-pay | Admitting: Family Medicine

## 2013-08-18 ENCOUNTER — Ambulatory Visit (INDEPENDENT_AMBULATORY_CARE_PROVIDER_SITE_OTHER): Payer: BC Managed Care – PPO | Admitting: Family Medicine

## 2013-08-18 VITALS — BP 132/78 | HR 97 | Temp 98.3°F | Wt 226.0 lb

## 2013-08-18 DIAGNOSIS — I639 Cerebral infarction, unspecified: Secondary | ICD-10-CM

## 2013-08-18 DIAGNOSIS — I6350 Cerebral infarction due to unspecified occlusion or stenosis of unspecified cerebral artery: Secondary | ICD-10-CM

## 2013-08-18 DIAGNOSIS — E785 Hyperlipidemia, unspecified: Secondary | ICD-10-CM

## 2013-08-18 DIAGNOSIS — E039 Hypothyroidism, unspecified: Secondary | ICD-10-CM

## 2013-08-18 DIAGNOSIS — I619 Nontraumatic intracerebral hemorrhage, unspecified: Secondary | ICD-10-CM

## 2013-08-18 LAB — COMPREHENSIVE METABOLIC PANEL
ALT: 31 U/L (ref 0–35)
AST: 15 U/L (ref 0–37)
Albumin: 4.2 g/dL (ref 3.5–5.2)
Alkaline Phosphatase: 58 U/L (ref 39–117)
BUN: 17 mg/dL (ref 6–23)
CO2: 28 mEq/L (ref 19–32)
Calcium: 11.4 mg/dL — ABNORMAL HIGH (ref 8.4–10.5)
Chloride: 106 mEq/L (ref 96–112)
Creat: 0.91 mg/dL (ref 0.50–1.10)
Glucose, Bld: 113 mg/dL — ABNORMAL HIGH (ref 70–99)
Potassium: 4.5 mEq/L (ref 3.5–5.3)
Sodium: 143 mEq/L (ref 135–145)
Total Bilirubin: 0.3 mg/dL (ref 0.2–1.2)
Total Protein: 7.2 g/dL (ref 6.0–8.3)

## 2013-08-18 LAB — THYROID PANEL WITH TSH
Free Thyroxine Index: 1.7 (ref 1.0–3.9)
T3 Uptake: 27.5 % (ref 22.5–37.0)
T4, Total: 6.1 ug/dL (ref 5.0–12.5)
TSH: 18.807 u[IU]/mL — ABNORMAL HIGH (ref 0.350–4.500)

## 2013-08-18 LAB — POCT GLYCOSYLATED HEMOGLOBIN (HGB A1C): Hemoglobin A1C: 6.3

## 2013-08-18 MED ORDER — ATORVASTATIN CALCIUM 10 MG PO TABS: 20.0000 mg | ORAL_TABLET | Freq: Every day | ORAL | Status: DC

## 2013-08-18 NOTE — Patient Instructions (Signed)
Ms Kassa, It was great to meet you and your daughter today! I am so sorry to hear about your stroke. I would like for to monitor you blood pressures for the next two weeks. Please return to clinic for the next 2 weeks for a nursing visit to check blood pressures. If your blood pressure remains elevated we can start a medication at that time I will call you and let you know the results of the tests Please avoid all aspirin containing products, ibuprofen, aleve or other anti-inflammatories I would also like for you to start taking 20mg  of lipitor  I will see you back in 2 weeks-1 month to readdress how things are going  Bernadene Bell, MD

## 2013-08-18 NOTE — Assessment & Plan Note (Signed)
A: s/p hemorrhagic stroke; deficits appear to be isolated memory issues, initially BP elevated to 155/91 but repeat within goal P: pt instructed to return for BP checks in the next 2 weeks with nursing staff -avoid ASA and plavix given hemorrhagic etiology -A1C -TSH -CMET -based on BP reading would plan to add back prinzide at next visit -contact daughter with results per pt's request [phone] -increase lipitor 20mg  and monitor for myalgias and hep

## 2013-08-18 NOTE — Progress Notes (Signed)
Patient ID: DAIJA ROUTSON, female   DOB: Sep 27, 1955, 58 y.o.   MRN: [id number] [facility] Bernadene Bell, MD Phone: [phone]  Subjective:  Ms Cid is a 58 y.o F who presents to est patient care and f/up post hospitalization and rehab for right thalamic hemorrhagic stroke (Feb 10th 2015)  # hospital f/up -Pt reports compliance with lipitor 10 and synthroid 75; has also been taking tramadol prn for discomfort  -pt previous on Prinzide prior to admission but had several episodes of borderline hypotension -pt states that she is back to her baseline functionally, does not currently require assistance with ADLs but does intermittently have memory deficits -pt was not aware that she was not supposed to use ASA or NSAIDs -does not have a BP cuff at home to check  [facility] -has not had a colonoscopy -also needs a mammogram -s/p TAH BSO (including cervix) for ovarian cysts?- told she does not need PAPs  All systems were reviewed and were negative unless otherwise noted in the HPI  Past Medical History Patient Active Problem List   Diagnosis Date Noted  . Thalamic hemorrhage 08/04/2013  . Stroke 07/27/2013  . ICH (intracerebral hemorrhage) 07/27/2013  . Accelerated hypertension 07/27/2013  . Obstructive hydrocephalus 07/27/2013   Reviewed problem list.  Medications- reviewed and updated Chief complaint-noted No additions to family history Social history- patient is a never smoker  Objective: BP 155/91  Pulse 97  Temp(Src) 98.3 F (36.8 C) (Oral)  Wt 226 lb (102.513 kg) Gen: NAD, alert, cooperative with exam HEENT: NCAT, EOMI, PERRL,  Neck: FROM, supple CV: RRR, good S1/S2, no murmur, cap refill <3, no bruits, no JVD Resp: CTABL, no wheezes, non-labored Abd: SNTND, BS present, no guarding or organomegaly Ext: 1+ non pitting edema, warm, normal tone, moves UE/LE spontaneously Neuro: Alert and oriented, CNii-xii intact; FTN and HTS intact; reflexes 2+  throughout upper and lower extremities Skin: no rashes no lesions  Assessment/Plan: See problem based a/p

## 2013-08-19 ENCOUNTER — Telehealth: Payer: Self-pay | Admitting: Family Medicine

## 2013-08-19 MED ORDER — LEVOTHYROXINE SODIUM 88 MCG PO TABS: 88.0000 ug | ORAL_TABLET | Freq: Every day | ORAL | Status: DC

## 2013-08-19 NOTE — Telephone Encounter (Signed)
Called pt's daughter to let her know the results of her mother's testing. Rec increased synthroid to 57mcgs and repeat testing in approx 6 weeks. Pt is calling to schedule BP recheck in 2 weeks time. Will monitor hypercalcemia (has had in the past 06/14). Would recommend checking PTH at next visit with thyroid testing [facility] MD

## 2013-08-25 ENCOUNTER — Telehealth: Payer: Self-pay | Admitting: Family Medicine

## 2013-08-25 ENCOUNTER — Ambulatory Visit: Payer: BC Managed Care – PPO | Admitting: Physical Therapy

## 2013-08-25 ENCOUNTER — Ambulatory Visit (INDEPENDENT_AMBULATORY_CARE_PROVIDER_SITE_OTHER): Payer: BC Managed Care – PPO | Admitting: Family Medicine

## 2013-08-25 ENCOUNTER — Encounter: Payer: Self-pay | Admitting: Family Medicine

## 2013-08-25 ENCOUNTER — Ambulatory Visit: Payer: BC Managed Care – PPO | Admitting: Speech Pathology

## 2013-08-25 VITALS — BP 158/110 | HR 94 | Temp 98.6°F | Wt 223.0 lb

## 2013-08-25 DIAGNOSIS — I619 Nontraumatic intracerebral hemorrhage, unspecified: Secondary | ICD-10-CM

## 2013-08-25 DIAGNOSIS — I10 Essential (primary) hypertension: Secondary | ICD-10-CM

## 2013-08-25 DIAGNOSIS — E785 Hyperlipidemia, unspecified: Secondary | ICD-10-CM

## 2013-08-25 MED ORDER — LISINOPRIL 40 MG PO TABS: 40.0000 mg | ORAL_TABLET | Freq: Every day | ORAL | Status: DC

## 2013-08-25 MED ORDER — ATORVASTATIN CALCIUM 80 MG PO TABS: 80.0000 mg | ORAL_TABLET | Freq: Every day | ORAL | Status: DC

## 2013-08-25 NOTE — Telephone Encounter (Signed)
Daughter called. At rehab today her BP was 152/113. Because it was so high, they would not let her work out. Told daughter to call and report this Please advise

## 2013-08-25 NOTE — Telephone Encounter (Signed)
Spoke with daughter.  Advised given her hx that she should be seen today to follow up on that BP.  She will have her uncle bring her intoday @ 4pm with Dr. Leslie Andrea. Eben Choinski, Salome Spotted

## 2013-08-25 NOTE — Progress Notes (Signed)
Subjective:     Patient ID: Morgan Collins, female   DOB: [birth year], 58 y.o.   MRN: [id number]  HPI 58 y.o. F s/p CVA in feb2015. Pt current in PT and at PT today BP elevated above threshold for exercise. Pt sent to acute clinic for evaluation. Pt without symptoms currently. No HA, vision change, or recurrent stroke symptoms. No CP or sob. Currently feeling well  Review of Systems See above    Objective:   Physical Exam Filed Vitals:   08/25/13 1613  BP: 158/110  Pulse: 94  Temp: 98.6 F (37 C)   Hypertensive, NAD CTAB no wrc RRR no mgt Ambulating with walker.   Lipid Panel     Component Value Date/Time   CHOL 343* 08/02/2013 0250   TRIG 97 08/02/2013 0250   HDL 61 08/02/2013 0250   CHOLHDL 5.6 08/02/2013 0250   VLDL 19 08/02/2013 0250   LDLCALC 263* 08/02/2013 0250    CVA risk over next 10 years 9.9% improved to 2.5% on high dose statin     Assessment:     58 y.o. F with hx of HTN and now Hypertensive. Will start on lisinopril 40mg  today. - BP at home daily and nurse visit in 1 week - check bmp at that visit - f/u in 2 weeks with provider to ensure stable and consider adding to blood pressure control.   HLD: increased lipitor to 40mg  daily until 10mg  tabs are gone, then increase to 80mg  daily. Pt with hx of CV disease and significantly elevated cholesterol and would benefit from high dose statin.  Fredrik Rigger, MD OB Fellow

## 2013-08-26 ENCOUNTER — Ambulatory Visit: Payer: BC Managed Care – PPO

## 2013-08-26 ENCOUNTER — Ambulatory Visit: Payer: BC Managed Care – PPO | Admitting: Physical Therapy

## 2013-08-30 ENCOUNTER — Ambulatory Visit (INDEPENDENT_AMBULATORY_CARE_PROVIDER_SITE_OTHER): Payer: BC Managed Care – PPO | Admitting: Family Medicine

## 2013-08-30 ENCOUNTER — Encounter: Payer: Self-pay | Admitting: Family Medicine

## 2013-08-30 ENCOUNTER — Ambulatory Visit (HOSPITAL_COMMUNITY)
Admission: RE | Admit: 2013-08-30 | Discharge: 2013-08-30 | Disposition: A | Discharge status: Home / Self Care | Payer: BC Managed Care – PPO | Source: Ambulatory Visit | Attending: Family Medicine | Admitting: Family Medicine

## 2013-08-30 VITALS — BP 126/80 | Temp 98.4°F | Wt 224.0 lb

## 2013-08-30 DIAGNOSIS — R079 Chest pain, unspecified: Secondary | ICD-10-CM

## 2013-08-30 DIAGNOSIS — I10 Essential (primary) hypertension: Secondary | ICD-10-CM

## 2013-08-30 NOTE — Assessment & Plan Note (Signed)
BP well controlled today - chest pain: intermittent, not associated with activity or SOB; EKG unchanged from previous - Continue Lisinpril - Continue rehab program and low salt diet

## 2013-08-30 NOTE — Progress Notes (Signed)
Subjective:     Patient ID: Morgan Collins, female   DOB: 1956-05-15, 58 y.o.   MRN: [id number]  HPI Comments: CHRONIC HYPERTENSION  BP Readings from Last 3 Encounters: 08/30/13 : 126/80 08/25/13 : 158/110 08/18/13 : 132/78  Control: Good Disease Monitoring  Blood pressure range outside clinc: all < 150/90  Chest pain: yes, intermittent left sided tightness w/o SOB, diaphoresis radiation; unable to specify how frequently; No FHx of MI   Dyspnea: no   Claudication: no  Medication compliance/financial difficulties: yes  Medication Side Effects: NO Dizziness/lightheadedness; No cough, angioedema,lightheadedness, rash  Preventitive Healthcare:    Smoking Status: Never Smoker                                  Exercise: Currently in rehab for stroke   Salt Restriction < 1500 mg daily: advised          Review of Systems  Constitutional: Negative for fever, chills and activity change.  Cardiovascular: Negative for palpitations and leg swelling.  Gastrointestinal: Negative for nausea and vomiting.  Neurological: Negative for dizziness, syncope, weakness and light-headedness.       Objective:   Physical Exam  Constitutional: She appears well-developed and well-nourished.  Cardiovascular: Normal rate, regular rhythm, normal heart sounds and intact distal pulses.  Exam reveals no gallop and no friction rub.   No murmur heard. NO LE edema or JVD   Pulmonary/Chest: Effort normal and breath sounds normal. No respiratory distress. She has no wheezes. She exhibits no tenderness.   Assessment/Plan:      See Problem Focused Assessment & Plan

## 2013-08-30 NOTE — Patient Instructions (Signed)
It was great seeing you today.   1. Your blood pressure is under great control now.  2. I recommend eating a low salt diet < 1500 mg a day and exercising at least 3 times a week for 30 minutes 3. I will check some blood work to and notify you if the results are abnormal   Please bring all your medications to every doctors visit  Sign up for My Chart to have easy access to your labs results, and communication with your Primary care physician.  Next Appointment  Please call to make an appointment with Dr Skeet Simmer in 3 months or sooner if you have questions or concerns   If you have any questions or concerns before then, please call the clinic at [phone].  Take Care,   Dr Phill Myron

## 2013-08-31 ENCOUNTER — Telehealth: Payer: Self-pay | Admitting: *Deleted

## 2013-08-31 ENCOUNTER — Ambulatory Visit: Payer: BC Managed Care – PPO | Admitting: Speech Pathology

## 2013-08-31 ENCOUNTER — Ambulatory Visit: Payer: BC Managed Care – PPO | Admitting: Physical Therapy

## 2013-08-31 LAB — BASIC METABOLIC PANEL
BUN: 19 mg/dL (ref 6–23)
CO2: 28 mEq/L (ref 19–32)
Calcium: 11.6 mg/dL — ABNORMAL HIGH (ref 8.4–10.5)
Chloride: 106 mEq/L (ref 96–112)
Creat: 0.99 mg/dL (ref 0.50–1.10)
Glucose, Bld: 80 mg/dL (ref 70–99)
Potassium: 4.6 mEq/L (ref 3.5–5.3)
Sodium: 141 mEq/L (ref 135–145)

## 2013-08-31 NOTE — Telephone Encounter (Signed)
Received call from Morgan Collins, Physical Therapist at [facility].  Pt is in clinic and blood pressure was taken at rest 152/118.  Pt denied SOB, dizziness, n/v complained of headache last night but relieved with Tylenol.  Pt still complaining of intermit chest pain.   Morgan Collins asked pt about her diet, pt reported that she ate pork with seasoning and if she understood what her restriction of salt under 1500 meant, pt stated she did not understand.  Precepted with Dr. Nori Riis, reviewed previous blood pressure reading, if pt chest pain is persistent and worsen stop physical therapy and send pt to ER.  Will forward note to provider.  Derl Barrow, RN

## 2013-09-02 ENCOUNTER — Ambulatory Visit: Payer: BC Managed Care – PPO | Admitting: Speech Pathology

## 2013-09-02 ENCOUNTER — Ambulatory Visit: Payer: BC Managed Care – PPO | Admitting: Physical Therapy

## 2013-09-07 ENCOUNTER — Ambulatory Visit: Payer: BC Managed Care – PPO | Admitting: Speech Pathology

## 2013-09-07 ENCOUNTER — Ambulatory Visit: Payer: BC Managed Care – PPO | Admitting: Physical Therapy

## 2013-09-08 ENCOUNTER — Ambulatory Visit: Payer: BC Managed Care – PPO | Admitting: Physical Therapy

## 2013-09-08 ENCOUNTER — Ambulatory Visit: Payer: BC Managed Care – PPO

## 2013-09-10 ENCOUNTER — Ambulatory Visit (INDEPENDENT_AMBULATORY_CARE_PROVIDER_SITE_OTHER): Payer: BC Managed Care – PPO | Admitting: Family Medicine

## 2013-09-10 ENCOUNTER — Encounter: Payer: Self-pay | Admitting: Family Medicine

## 2013-09-10 VITALS — BP 127/95 | HR 91 | Temp 97.5°F | Wt 226.0 lb

## 2013-09-10 DIAGNOSIS — I10 Essential (primary) hypertension: Secondary | ICD-10-CM

## 2013-09-10 NOTE — Patient Instructions (Signed)
It was great seeing you today.   1. Please make nursing visit for BP check in 2 weeks.    Please bring all your medications to every doctors visit  Sign up for My Chart to have easy access to your labs results, and communication with your Primary care physician.  Next Appointment  Make an appointment with Nurse for BP check in 2 weeks.    I look forward to talking with you again at our next visit. If you have any questions or concerns before then, please call the clinic at [phone].  Take Care,   Dr Phill Myron

## 2013-09-10 NOTE — Assessment & Plan Note (Signed)
DBP slightly elevated initially but 130/85 on recheck - BP borderline at physical therapy - Return to clinic for nurse BP check in 2 weeks - Consider adding HCTZ or CCB at next visit, even if BP wnl given that she is [place] w/ normal renal fcn

## 2013-09-10 NOTE — Progress Notes (Signed)
Subjective:     Patient ID: Morgan Collins, female   DOB: 02/24/1956, 58 y.o.   MRN: [id number]  HPI Comments: Comes in today for BP check. Reports BP has been better at physical therapy since starting ACE-I and is able to participate in exercise program now. However BP is still borderline elevated at PT visits. She continues to noted intermittent chest pain which is unchanged from last visit and evaluated by EKG.     Review of Systems  Constitutional: Negative for activity change, fatigue and unexpected weight change.  Respiratory: Negative for cough.   Cardiovascular: Positive for chest pain. Negative for palpitations and leg swelling.       Objective:   Physical Exam  Cardiovascular: Normal rate and regular rhythm.   No murmur heard. NO LE edema  Pulmonary/Chest: Effort normal and breath sounds normal. No respiratory distress.    Assessment/Plan:      See Problem Focused Assessment & Plan

## 2013-09-14 ENCOUNTER — Ambulatory Visit: Payer: BC Managed Care – PPO | Admitting: Speech Pathology

## 2013-09-14 ENCOUNTER — Ambulatory Visit: Payer: BC Managed Care – PPO | Admitting: Physical Therapy

## 2013-09-16 ENCOUNTER — Ambulatory Visit: Payer: BC Managed Care – PPO | Admitting: Speech Pathology

## 2013-09-16 ENCOUNTER — Ambulatory Visit: Payer: BC Managed Care – PPO | Attending: Physical Medicine & Rehabilitation | Admitting: Physical Therapy

## 2013-09-16 DIAGNOSIS — R269 Unspecified abnormalities of gait and mobility: Secondary | ICD-10-CM | POA: Diagnosis not present

## 2013-09-16 DIAGNOSIS — R41841 Cognitive communication deficit: Secondary | ICD-10-CM | POA: Diagnosis not present

## 2013-09-16 DIAGNOSIS — M6281 Muscle weakness (generalized): Secondary | ICD-10-CM | POA: Diagnosis not present

## 2013-09-16 DIAGNOSIS — Z5189 Encounter for other specified aftercare: Secondary | ICD-10-CM | POA: Diagnosis present

## 2013-09-23 ENCOUNTER — Ambulatory Visit (INDEPENDENT_AMBULATORY_CARE_PROVIDER_SITE_OTHER): Payer: BC Managed Care – PPO | Admitting: Family Medicine

## 2013-09-23 ENCOUNTER — Encounter: Payer: Self-pay | Admitting: Family Medicine

## 2013-09-23 VITALS — BP 141/95 | HR 98 | Temp 98.9°F | Wt 227.0 lb

## 2013-09-23 DIAGNOSIS — I10 Essential (primary) hypertension: Secondary | ICD-10-CM

## 2013-09-23 DIAGNOSIS — E669 Obesity, unspecified: Secondary | ICD-10-CM

## 2013-09-23 MED ORDER — HYDROCHLOROTHIAZIDE 12.5 MG PO TABS: 12.5000 mg | ORAL_TABLET | Freq: Every day | ORAL | Status: DC

## 2013-09-23 NOTE — Assessment & Plan Note (Addendum)
A: hx of fluctuating BPs; suspect will be difficult to control; stable on lisinopril (mostly compliant) amenable to adding additional agent P: cont lisinopril 40 -Add hctz (for stroke control) has been on prinzide in the past, suspect pt will be able to tolerate -Instructed to take BP in am prior to taking meds, do not take if systolic <02 -RTC in 2 weeks for nurse BP check -routine BPs at home/offfice -risk modify -check lfts/ cr next visit -close and frequent monitoring -nutrition consult per pt request and mine

## 2013-09-23 NOTE — Progress Notes (Signed)
Patient ID: Morgan Collins, female   DOB: [birth year], 58 y.o.   MRN: [id number] [facility] Bernadene Bell, MD Phone: [phone]  Subjective:  Morgan Collins is a 58 y.o F with hx of rigth thalamic hemorrhagic stroke here to see me for f/up on BP  # HTN -has intermittently fluctuated between normotensive bps and hypertensive episodes (lowest 70s/40s-140s/95s) -continues to be elevated during physical activity and when participating at rehab -will miss up to 1 pill of lisinopril a week; no stated side effects -occasionally has cp (also feels this is assc with anxiety), but no SOB, palps, dizziness, sweats, light headedness -admits to dietary indiscretions (will add salt to food, enjoys fried chicken) -working to increase physical activity  All systems were reviewed and were negative unless otherwise noted in the HPI  Past Medical History Patient Active Problem List   Diagnosis Date Noted  . Other and unspecified hyperlipidemia 08/18/2013  . Unspecified hypothyroidism 08/18/2013  . Thalamic hemorrhage 08/04/2013  . Stroke 07/27/2013  . ICH (intracerebral hemorrhage) 07/27/2013  . Essential hypertension, benign 07/27/2013  . Obstructive hydrocephalus 07/27/2013   Reviewed problem list.  Medications- reviewed and updated Chief complaint-noted No additions to family history Social history- patient is a never smoker  Objective: BP 141/95  Pulse 98  Temp(Src) 98.9 F (37.2 C) (Oral)  Wt 227 lb (102.967 kg) Gen: NAD, alert, cooperative with exam HEENT: NCAT, EOMI, PERRL,  Neck: FROM, supple CV: RRR, good S1/S2, no murmur, cap refill <3 Resp: CTABL, no wheezes, non-labored Ext: No edema, warm, normal tone, moves UE/LE spontaneously Neuro: Alert and oriented, No gross deficits Skin: no rashes no lesions  Assessment/Plan: See problem based a/p

## 2013-09-23 NOTE — Patient Instructions (Signed)
Ms Feldt, It was great to see you today.  For your blood pressure we would like you to: Take hydrochlorothiazide 12.5 mg once a day with your other blood pressure medicine (lisinopril 40 mg). Monitor your blood pressure at home every morning and do not take your blood pressure medicine if it is less than 90 for the top number.  If you continue to experience chest pain that does not go away with medication or is associated with nausea vomiting sweating, shortness of breath. PLease go to the emergency department right away.  I would like you to focus on taking care of yourself in the next several months. I know you care a lot about your family but your health is important also. And as you know the stress can make those feelings worse for you. Focus on relaxing activities that you enjoy and spend some time to yourself   [facility], MD

## 2013-09-28 ENCOUNTER — Ambulatory Visit: Payer: BC Managed Care – PPO | Admitting: Speech Pathology

## 2013-09-28 DIAGNOSIS — Z5189 Encounter for other specified aftercare: Secondary | ICD-10-CM | POA: Diagnosis not present

## 2013-09-29 ENCOUNTER — Telehealth: Payer: Self-pay | Admitting: Family Medicine

## 2013-09-29 NOTE — Telephone Encounter (Signed)
Daughter called and needs some clarification on the instructions that were given to her mother to follow. Please call her at [phone]. Blima Rich

## 2013-09-30 ENCOUNTER — Ambulatory Visit: Payer: BC Managed Care – PPO | Admitting: Speech Pathology

## 2013-09-30 ENCOUNTER — Ambulatory Visit: Payer: BC Managed Care – PPO

## 2013-09-30 NOTE — Telephone Encounter (Signed)
Will forward to MD to clarify instructions. Idrissa Beville,CMA

## 2013-10-04 NOTE — Telephone Encounter (Signed)
Discussed [facility], MD

## 2013-10-05 ENCOUNTER — Encounter
Payer: BC Managed Care – PPO | Attending: Physical Medicine & Rehabilitation | Admitting: Physical Medicine & Rehabilitation

## 2013-10-05 ENCOUNTER — Encounter: Payer: BC Managed Care – PPO | Admitting: Speech Pathology

## 2013-10-05 ENCOUNTER — Encounter: Payer: Self-pay | Admitting: Physical Medicine & Rehabilitation

## 2013-10-05 VITALS — BP 118/79 | HR 93 | Resp 14 | Ht 63.0 in | Wt 229.0 lb

## 2013-10-05 DIAGNOSIS — I10 Essential (primary) hypertension: Secondary | ICD-10-CM | POA: Insufficient documentation

## 2013-10-05 DIAGNOSIS — I69998 Other sequelae following unspecified cerebrovascular disease: Secondary | ICD-10-CM | POA: Insufficient documentation

## 2013-10-05 DIAGNOSIS — E039 Hypothyroidism, unspecified: Secondary | ICD-10-CM | POA: Insufficient documentation

## 2013-10-05 DIAGNOSIS — R071 Chest pain on breathing: Secondary | ICD-10-CM | POA: Insufficient documentation

## 2013-10-05 DIAGNOSIS — I619 Nontraumatic intracerebral hemorrhage, unspecified: Secondary | ICD-10-CM

## 2013-10-05 DIAGNOSIS — G4720 Circadian rhythm sleep disorder, unspecified type: Secondary | ICD-10-CM | POA: Insufficient documentation

## 2013-10-05 DIAGNOSIS — G911 Obstructive hydrocephalus: Secondary | ICD-10-CM | POA: Insufficient documentation

## 2013-10-05 MED ORDER — LEVOTHYROXINE SODIUM 100 MCG PO TABS: 100.0000 ug | ORAL_TABLET | Freq: Every day | ORAL | Status: DC

## 2013-10-05 NOTE — Progress Notes (Deleted)
Subjective:    Patient ID: Morgan Collins, female    DOB: 07-26-1955, 58 y.o.   MRN: [id number]  HPI  Morgan Collins is back regarding her thalamic hemorrhage. She was on inpatient rehab with [place]. She is no longer having headaches. She is complaining of left chest wall pain which comes and goes (was happening before the stroke). She is not sure what brings on the pain, however anxiety may be related to it.   Her gait has improved. She is not using a cane or walker. She hasn't had any falls. However, she's not getting out of the house much. She feels that she doesn't have the same drive or "get up and go" since coming home.   Appetite is good. She is sleeping a lot during the day but then she frequently stays awake at night.   She has chronically sore and sometimes swollen ankles.   Pain Inventory Average Pain 0 Pain Right Now 0 My pain is n/a  In the last 24 hours, has pain interfered with the following? General activity 0 Relation with others 0 Enjoyment of life 0 What TIME of day is your pain at its worst? n/a Sleep (in general) n/a  Pain is worse with: n/a Pain improves with: n/a Relief from Meds: n/a  Mobility ability to climb steps?  yes do you drive?  no Do you have any goals in this area?  yes  Function retired  Neuro/Psych weakness  Prior Studies Any changes since last visit?  no  Physicians involved in your care Any changes since last visit?  no   History reviewed. No pertinent family history. History   Social History  . Marital Status: Single    Spouse Name: N/A    Number of Children: N/A  . Years of Education: N/A   Social History Main Topics  . Smoking status: Never Smoker   . Smokeless tobacco: None  . Alcohol Use: No  . Drug Use: None  . Sexual Activity: None   Other Topics Concern  . None   Social History Narrative  . None   History reviewed. No pertinent past surgical history. Past Medical History  Diagnosis Date  .  Hypertension   . Thyroid disease   . High cholesterol   . Depressed    BP 118/79  Pulse 93  Resp 14  Ht 5\' 3"  (1.6 m)  Wt 229 lb (103.874 kg)  BMI 40.58 kg/m2  SpO2 98%  Opioid Risk Score:   Fall Risk Score: Moderate Fall Risk (6-13 points) (patient educated handout declined)   Review of Systems  Neurological: Positive for weakness.  All other systems reviewed and are negative.      Objective:   Physical Exam  Constitutional: She appears well-developed.  HENT: dentition fair  Eyes: EOM are normal.  Neck: Normal range of motion. Neck supple. No thyromegaly present.  Cardiovascular: Normal rate and regular rhythm.  Respiratory: Effort normal and breath sounds normal. No respiratory distress.  GI: Soft. Bowel sounds are normal. She exhibits no distension.  Neurological: She is alert.  Mood is flat but appropriate. She makes good eye contact with examiner. She was able to state her name, age, date of birth in place. Very alert. Moves all 4's. Strength 5/5 grossly RUE and RLE and 4+ to 5/5 left bicep, tricep, deltoid, wrist, hand-- 4+ to 5/5 left HF, KE, ankle. Appears to have intact pain and light touch. Balance is good after she started off slowly. Able to  walk heel to toe.  Musc: mild edema each ankle. Left chest wall non-tender. No spasm felt along pec minor (which was the area she states usually bothers her)  Skin: Skin is warm and dry. Scalp incision clean Psychiatric:  Cooperative. still flat.    Assessment/Plan:  1.  Right thalamic hemorrhage with hydrocephalus. IVC removed 08/04/2013  2. Altered sleep cycle. Needs to work on normal sleep-wake habits. Discussed increasing activities during the day and avoid excess napping 3. Pain Management: tylenol prn, heat/ice for left chest wall pain--(may be pec minor spasm)  4. Neuropsych:  I sense there is some depression going on. Daughter does as well. Will follow for now, and encouraged the patient to re-engage herself.  5.  Hypothyroid: increase supplement to 136mcg. Recheck full panel in 2 months.  This may help day time energy level    30 minutes of face to face patient care time were spent during this visit. All questions were encouraged and answered.   Meredith Staggers, MD, [place]

## 2013-10-05 NOTE — Patient Instructions (Signed)
YOU NEED RE-ESTABLISH REGULAR SLEEP WAKE PATTERNS.  YOU CAN NAP DURING THE DAY BUT IT SHOULDN'T BE MORE THAN AN HOUR OR SO  WORK ON FINDING ACTIVITIES TO ENGAGE YOURSELF IN DURING THE DAY.  WORK ON WAYS TO RELIEVE STRESS   PLEASE CALL ME WITH ANY PROBLEMS OR QUESTIONS (#[phone]).

## 2013-10-06 ENCOUNTER — Ambulatory Visit: Payer: BC Managed Care – PPO

## 2013-10-06 DIAGNOSIS — Z5189 Encounter for other specified aftercare: Secondary | ICD-10-CM | POA: Diagnosis not present

## 2013-10-07 ENCOUNTER — Ambulatory Visit: Payer: BC Managed Care – PPO | Admitting: Family Medicine

## 2013-10-08 ENCOUNTER — Ambulatory Visit: Payer: BC Managed Care – PPO

## 2013-10-08 DIAGNOSIS — Z5189 Encounter for other specified aftercare: Secondary | ICD-10-CM | POA: Diagnosis not present

## 2013-10-12 ENCOUNTER — Ambulatory Visit: Payer: BC Managed Care – PPO | Admitting: Speech Pathology

## 2013-10-12 DIAGNOSIS — Z5189 Encounter for other specified aftercare: Secondary | ICD-10-CM | POA: Diagnosis not present

## 2013-10-14 ENCOUNTER — Telehealth: Payer: Self-pay

## 2013-10-14 ENCOUNTER — Ambulatory Visit: Payer: BC Managed Care – PPO | Admitting: Speech Pathology

## 2013-10-14 DIAGNOSIS — Z5189 Encounter for other specified aftercare: Secondary | ICD-10-CM | POA: Diagnosis not present

## 2013-10-14 NOTE — Telephone Encounter (Signed)
i would give it another day. Would look for other symptoms such as weakness or sensory changes. Also check bp to make sure it's not too low.  Also make sure there are no allergies or environmental issues which may be contributing. If it persists, she should see her family doctor.

## 2013-10-14 NOTE — Telephone Encounter (Signed)
Patient's daughter stated that patient woke up this morning with blurred vision in both eyes.  She thought as the morning went on that it would correct itself, but it did not. No other symptoms noted.  Please advise

## 2013-10-15 NOTE — Telephone Encounter (Signed)
Contacted patient's daughter Verdie Drown)  to inform her per Dr. Naaman Plummer to give it another day. Look for other symptoms such as weakness or sensory changes. Also check bp to make sure it's not too low. Also make sure there are no allergies or environmental issues which may be contributing. If it persists, she should see her PCP.

## 2013-10-19 ENCOUNTER — Ambulatory Visit: Payer: BC Managed Care – PPO | Attending: Physical Medicine & Rehabilitation | Admitting: Speech Pathology

## 2013-10-19 DIAGNOSIS — R41841 Cognitive communication deficit: Secondary | ICD-10-CM | POA: Insufficient documentation

## 2013-10-19 DIAGNOSIS — Z5189 Encounter for other specified aftercare: Secondary | ICD-10-CM | POA: Insufficient documentation

## 2013-10-19 DIAGNOSIS — R269 Unspecified abnormalities of gait and mobility: Secondary | ICD-10-CM | POA: Insufficient documentation

## 2013-10-19 DIAGNOSIS — M6281 Muscle weakness (generalized): Secondary | ICD-10-CM | POA: Insufficient documentation

## 2013-10-20 ENCOUNTER — Telehealth: Payer: Self-pay

## 2013-10-20 DIAGNOSIS — F329 Major depressive disorder, single episode, unspecified: Secondary | ICD-10-CM | POA: Insufficient documentation

## 2013-10-20 MED ORDER — CITALOPRAM HYDROBROMIDE 10 MG PO TABS: 10.0000 mg | ORAL_TABLET | Freq: Every day | ORAL | Status: DC

## 2013-10-20 NOTE — Telephone Encounter (Signed)
Patient called requesting something for depression.  She says she "just doesn't want to do anything."

## 2013-10-20 NOTE — Telephone Encounter (Signed)
celexa rx sent to the [facility]. 10mg  qhs

## 2013-10-21 ENCOUNTER — Ambulatory Visit: Payer: BC Managed Care – PPO | Admitting: Speech Pathology

## 2013-10-21 NOTE — Telephone Encounter (Signed)
Left message advising patient celexa was sent to [facility].

## 2013-10-22 ENCOUNTER — Ambulatory Visit (INDEPENDENT_AMBULATORY_CARE_PROVIDER_SITE_OTHER): Payer: BC Managed Care – PPO | Admitting: *Deleted

## 2013-10-22 VITALS — BP 138/80

## 2013-10-22 DIAGNOSIS — I10 Essential (primary) hypertension: Secondary | ICD-10-CM

## 2013-10-22 NOTE — Progress Notes (Signed)
    Pt in clinic today for blood pressure check.  Blood pressure 138/80 manually.  Pt denies any dizziness, headache, chest pain or visual changes.  Pt stated she did not take any blood pressure medication until blood pressure reading.  Pt stated she was only suppose to take one blood pressure pill if top number was above 90.  Pt informed of Dr. Burt Ek office note from 09/23/2013 that she need to take blood pressure in the AM before taking medication; if systolic <60 hold medications.  Pt needs to take both HCTZ and Lisinopril if blood pressure is above 90.  Pt stated understanding.  Will forward to PCP.  Derl Barrow, RN

## 2013-10-26 ENCOUNTER — Ambulatory Visit: Payer: BC Managed Care – PPO | Admitting: Speech Pathology

## 2013-10-28 ENCOUNTER — Telehealth: Payer: Self-pay | Admitting: Family Medicine

## 2013-10-28 MED ORDER — LISINOPRIL 40 MG PO TABS: 40.0000 mg | ORAL_TABLET | Freq: Every day | ORAL | Status: DC

## 2013-10-28 NOTE — Telephone Encounter (Signed)
Pt called and needs a refill on her lisinopril jw

## 2013-11-04 ENCOUNTER — Encounter (INDEPENDENT_AMBULATORY_CARE_PROVIDER_SITE_OTHER): Payer: Self-pay

## 2013-11-04 ENCOUNTER — Encounter: Payer: Self-pay | Admitting: Nurse Practitioner

## 2013-11-04 ENCOUNTER — Ambulatory Visit (INDEPENDENT_AMBULATORY_CARE_PROVIDER_SITE_OTHER): Payer: BC Managed Care – PPO | Admitting: Nurse Practitioner

## 2013-11-04 VITALS — HR 80 | Ht 61.5 in | Wt 234.4 lb

## 2013-11-04 DIAGNOSIS — I619 Nontraumatic intracerebral hemorrhage, unspecified: Secondary | ICD-10-CM

## 2013-11-04 NOTE — Progress Notes (Signed)
PATIENT: YACINE DROZ DOB: 04-Jan-1956  REASON FOR VISIT: hospital follow up for stroke HISTORY FROM: patient  HISTORY OF PRESENT ILLNESS: ARLIENE ROSENOW is an 58 y.o. female history hypertension, hyperlipidemia and hypothyroidism and poor compliance with taking prescribed medications presenting with complaint of headache on the morning of 07/27/2013 and sudden onset of weakness and numbness involving left side at 10 AM 07/27/2013. She had no previous history of stroke nor TIA. CT scan of her head showed right thalamic hemorrhage with extension into lateral and third ventricles with mass effect on the third ventricle and signs of early ventricle enlargement. NIH stroke score was 11. Patient became increasingly more lethargic in the emergency room. Repeat CT of her head showed a slight increase in size of her lateral ventricles indicative of early hydrocephalus. There was no significant increase in size of patient's thalamic hemorrhage with midline shift of 13mm, obstructive hydrocephalus. Dr. Joya Salm, neurosurgeon consulted with IVC placement. Initially patient overtly confused, improved during her monitored stay in NICU. Her IVC was clamped 2/13 with an increase in ventricular size & temporal horns. Drain reopened with relief. Reclamped 3 days later with no increase in ventricular size as CT stable without evidence of hydrocephalus. IVC was removed 2/18.   Hemorrhage most likely secondary to accelerated hypertension.   Patient with resultant mild left hemiparesis, headache, vertical gaze abnormality, dysarthria - all which have improved. She is working with PCP at [facility] to keep BP under good control. Blood pressure in office today is 115/77. Most deficits have resolved, still with mild left weakness. She had headaches that have diminished.  She endorses profound fatigue which may be due to her depression, geriatric depression score is 11.  She was started on citalopram 2 weeks ago.   She states she doesn't feel like getting out of bed most days and naps often but that she has trouble sleeping at night.   REVIEW OF SYSTEMS: Full 14 system review of systems performed and notable only for:  Weight gain, fatigue, chest pain, confusion, depression, anxiety, disinterest in activities   ALLERGIES: No Known Allergies  HOME MEDICATIONS: Outpatient Prescriptions Prior to Visit  Medication Sig Dispense Refill  . atorvastatin (LIPITOR) 80 MG tablet Take 1 tablet (80 mg total) by mouth daily.  90 tablet  3  . citalopram (CELEXA) 10 MG tablet Take 1 tablet (10 mg total) by mouth at bedtime.  30 tablet  2  . hydrochlorothiazide (HYDRODIURIL) 12.5 MG tablet Take 1 tablet (12.5 mg total) by mouth daily.  90 tablet  3  . levothyroxine (SYNTHROID, LEVOTHROID) 100 MCG tablet Take 1 tablet (100 mcg total) by mouth daily.  90 tablet  3  . lisinopril (PRINIVIL,ZESTRIL) 40 MG tablet Take 1 tablet (40 mg total) by mouth daily.  30 tablet  1   No facility-administered medications prior to visit.   PHYSICAL EXAM  Filed Vitals:   11/04/13 1333  Pulse: 80  Height: 5' 1.5" (1.562 m)  Weight: 234 lb 6.4 oz (106.323 kg)   Body mass index is 43.58 kg/(m^2).  Visual Acuity Screening   Right eye Left eye Both eyes  Without correction: 20/30 20/30   With correction:      Generalized: Well developed, in no acute distress, morbidly obese African American female Head: normocephalic and atraumatic. Oropharynx benign  Neck: Supple, no carotid bruits  Cardiac: Regular rate rhythm, no murmur  Musculoskeletal: No deformity   Neurological examination  Mentation: Alert oriented to time, place,  history taking. Follows all commands speech and language fluent. MMSE 27/30 with 1/3 of delayed recall, missed repetition. AFT 9. Clock drawing 4/4, GDS 11. Cranial nerve II-XII: Pupils were equal round reactive to light extraocular movements were full, visual field were full on confrontational test. Facial  sensation and strength were normal. hearing was intact to finger rubbing bilaterally. Uvula tongue midline. head turning and shoulder shrug and were normal and symmetric.Tongue protrusion into cheek strength was normal. Motor: The motor testing reveals 5 over 5 strength of all 4 extremities. Good symmetric motor tone is noted throughout.  Sensory: Sensory testing is intact to pinprick, soft touch, vibration sensation, and position sense on all 4 extremities. No evidence of extinction is noted.  Coordination: Cerebellar testing reveals good finger-nose-finger bilaterally. Not able to perform Heel-to-shin. Gait and station: Patient has significant difficulty getting up from chair, assistance is needed. Gait is wide-based, slow and cautious. Tandem gait is not attempted. Romberg is negative.  Reflexes: Deep tendon reflexes are symmetric and normal bilaterally.    ASSESSMENT: 58 y.o. year old female  has a past medical history of Hypertension; Thyroid disease; High cholesterol; and Depression here for hospital follow up of right thalamic hemorrhage with extension into lateral and third ventricles with mass effect and hydrocephalus that occurred on 07/27/13.  IVC was removed 08/04/2013 Hemorrhage most likely secondary to accelerated hypertension.  Patient is experiencing significant depression due to stroke; denies suicidal or homicidal ideation. She just started treatment on citalopram 2 weeks ago.  PLAN: I had a long discussion with the patient and partner regarding her recent strokes, discussed results of evaluation in the hospital and answered questions. She was advised to try to get back involved with activities that she did before and call on family when needed. We discussed a referral to counseling for her depression, she would rather wait a couple more weeks to see if she feels better because she is only been on citalopram for 2 weeks.  Due to hemorrhage and risk of bleeding, do not take aspirin,  aspirin-containing medications, or ibuprofen products.  Ongoing risk factor control by Primary Care Physician.  Follow up in 4 months, sooner as needed.  Philmore Pali, MSN, NP-C 11/04/2013, 5:38 PM [facility] [address], [place], [place] [address] [phone]  Note: This document was prepared with digital dictation and possible smart phrase technology. Any transcriptional errors that result from this process are unintentional.

## 2013-11-04 NOTE — Patient Instructions (Addendum)
Due to hemorrhage and risk of bleeding, do not take aspirin, aspirin-containing medications, or ibuprofen products.  Maintain strict control of hypertension with blood pressure goal below 130/90, diabetes with hemoglobin A1c goal below 6.5% and lipids with LDL cholesterol goal below 100 mg/dl.  Graduated return to driving instructions It is recommended that the patient first drives with another licensed driver in an empty parking lot. If the patient does well with this, and they can drive on a quiet street with the licensed driver. If the patient does well with this they can drive on a busy street with a licensed driver. If the patient does well with this, the next time out they can go by himself. For the first month after resuming driving, I recommend no nighttime or Interstate driving.   Follow up in 4 months, sooner as needed.

## 2013-11-05 NOTE — Progress Notes (Signed)
 Subjective:    Patient ID: Morgan Collins, female    DOB: 08/08/1955, 58 y.o.   MRN: [id number]  HPI  Morgan Collins is back regarding her thalamic hemorrhage. She was on inpatient rehab with us. She is no longer having headaches. She is complaining of left chest wall pain which comes and goes (was happening before the stroke). She is not sure what brings on the pain, however anxiety may be related to it.   Her gait has improved. She is not using a cane or walker. She hasn't had any falls. However, she's not getting out of the house much. She feels that she doesn't have the same drive or "get up and go" since coming home.   Appetite is good. She is sleeping a lot during the day but then she frequently stays awake at night.   She has chronically sore and sometimes swollen ankles.   Pain Inventory Average Pain 0 Pain Right Now 0 My pain is n/a  In the last 24 hours, has pain interfered with the following? General activity 0 Relation with others 0 Enjoyment of life 0 What TIME of day is your pain at its worst? n/a Sleep (in general) n/a  Pain is worse with: n/a Pain improves with: n/a Relief from Meds: n/a  Mobility ability to climb steps?  yes do you drive?  no Do you have any goals in this area?  yes  Function retired  Neuro/Psych weakness  Prior Studies Any changes since last visit?  no  Physicians involved in your care Any changes since last visit?  no   History reviewed. No pertinent family history. History   Social History  . Marital Status: Single    Spouse Name: N/A    Number of Children: N/A  . Years of Education: N/A   Social History Main Topics  . Smoking status: Never Smoker   . Smokeless tobacco: None  . Alcohol Use: No  . Drug Use: None  . Sexual Activity: None   Other Topics Concern  . None   Social History Narrative  . None   History reviewed. No pertinent past surgical history. Past Medical History  Diagnosis Date  .  Hypertension   . Thyroid disease   . High cholesterol   . Depressed    BP 118/79  Pulse 93  Resp 14  Ht 5' 3" (1.6 m)  Wt 229 lb (103.874 kg)  BMI 40.58 kg/m2  SpO2 98%  Opioid Risk Score:   Fall Risk Score: Moderate Fall Risk (6-13 points) (patient educated handout declined)   Review of Systems  Neurological: Positive for weakness.  All other systems reviewed and are negative.      Objective:   Physical Exam  Constitutional: She appears well-developed.  HENT: dentition fair  Eyes: EOM are normal.  Neck: Normal range of motion. Neck supple. No thyromegaly present.  Cardiovascular: Normal rate and regular rhythm.  Respiratory: Effort normal and breath sounds normal. No respiratory distress.  GI: Soft. Bowel sounds are normal. She exhibits no distension.  Neurological: She is alert.  Mood is flat but appropriate. She makes good eye contact with examiner. She was able to state her name, age, date of birth in place. Very alert. Moves all 4's. Strength 5/5 grossly RUE and RLE and 4+ to 5/5 left bicep, tricep, deltoid, wrist, hand-- 4+ to 5/5 left HF, KE, ankle. Appears to have intact pain and light touch. Balance is good after she started off slowly. Able to   heel to toe.  Musc: mild edema each ankle. Left chest wall non-tender. No spasm felt along pec minor (which was the area she states usually bothers her)  Skin: Skin is warm and dry. Scalp incision clean Psychiatric:  Cooperative. still flat.    Assessment/Plan:  1.  Right thalamic hemorrhage with hydrocephalus. IVC removed 08/04/2013  2. Altered sleep cycle. Needs to work on normal sleep-wake habits. Discussed increasing activities during the day and avoid excess napping 3. Pain Management: tylenol prn, heat/ice for left chest wall pain--(may be pec minor spasm)  4. Neuropsych:  I sense there is some depression going on. Daughter does as well. Will follow for now, and encouraged the patient to re-engage herself.  5. Hypothyroid:  increase supplement to 155mcg. Recheck full panel in 2 months.  This may help day time energy level    30 minutes of face to face patient care time were spent during this visit. All questions were encouraged and answered.   Meredith Staggers, MD, [place]

## 2013-11-12 ENCOUNTER — Ambulatory Visit: Payer: Self-pay | Admitting: Nurse Practitioner

## 2013-11-15 ENCOUNTER — Ambulatory Visit: Payer: BC Managed Care – PPO | Admitting: Family Medicine

## 2013-11-22 ENCOUNTER — Encounter: Payer: Self-pay | Admitting: Family Medicine

## 2013-11-22 ENCOUNTER — Ambulatory Visit (INDEPENDENT_AMBULATORY_CARE_PROVIDER_SITE_OTHER): Payer: BC Managed Care – PPO | Admitting: Family Medicine

## 2013-11-22 VITALS — Ht 62.75 in | Wt 233.9 lb

## 2013-11-22 DIAGNOSIS — E669 Obesity, unspecified: Secondary | ICD-10-CM

## 2013-11-22 DIAGNOSIS — I10 Essential (primary) hypertension: Secondary | ICD-10-CM

## 2013-11-22 DIAGNOSIS — E785 Hyperlipidemia, unspecified: Secondary | ICD-10-CM

## 2013-11-22 NOTE — Patient Instructions (Signed)
-   Your chosen goals:   1. Get at least one fruit per day.    2. Limit sweet drinks to 12 oz per day.  This includes sweet tea, soda, juice, juice drinks (less is better.)  3. Walk ~15 min at least once a day.   - AND: - Explore the possibility of going for exercise at the senior center (transportation?).   Other good practices if you can manage it: - Eat at least 3 meals and 1-2 snacks per day.  Aim for no more than 5 hours between eating while you are awake.   - Eat breakfast as soon as you can after your hour wait following the thryroid medicine.   - For both lunch and dinner, include protein, carbohydrate (starch), and vegetables.

## 2013-11-22 NOTE — Progress Notes (Signed)
Medical Nutrition Therapy:  Appt start time: 1630 end time:  1730.  Assessment:  Primary concerns today: Weight management.   Ms. Boettner was accompanied by her grandson CJ today.  She has not been able to drive since her stroke in Feb.  Per today's conversation, her loss of independence has been a struggle for her to adjust to.  Usual eating pattern includes 3 meals and 0-1 snacks per day. Usual physical activity includes walk the stairs several times a day.  BR is upstairs.  Not doing rehab ex's given by PT following stroke in Feb.  Hard to feel motivated to do much.  Often sleeps during the day.    Frequent foods include flavored water ([place]/Crystal Light), 2% milk, Coke, Bright & Early juice drink, baked fish, grits, eggs, sausage or Honey Nut Cheerios or Frosted Flakes, baked chx.  Avoided foods include deli meats, most fresh veg's (prefers canned).    24-hr recall: (Up at 8 AM) B ( AM)-   Up, but did not eat; Snk ( AM)-   fell asleep in chair. L (12 PM)-  2 c spaghetti w/ meat sauce, 8 saltines, 24 Coke, 1 honeybun Snk ( PM)-   D (8 PM)-  1 c mashed potato, 6 oz brisket, 48 oz sweet tea Snk (1 AM)-  salad, dressing, 24 sweet tea; 2-3 c popcorn  Yesterday was fairly typical, although daily eating schedule and intake varies quite a lot.  Had a "girls' night in" yesterday with her friend.    Progress Towards Goal(s):  In progress.   Nutritional Diagnosis:  NI-5.8.2 Excessive carbohydrate intake As related to beverages.  As evidenced by usual intake of several sweet drinks per day.    Intervention:  Nutrition education.  Monitoring/Evaluation:  Dietary intake, exercise, and body weight in 5 week(s).

## 2013-12-01 ENCOUNTER — Encounter
Payer: BC Managed Care – PPO | Attending: Physical Medicine & Rehabilitation | Admitting: Physical Medicine & Rehabilitation

## 2013-12-01 DIAGNOSIS — I69998 Other sequelae following unspecified cerebrovascular disease: Secondary | ICD-10-CM | POA: Insufficient documentation

## 2013-12-01 DIAGNOSIS — G4720 Circadian rhythm sleep disorder, unspecified type: Secondary | ICD-10-CM | POA: Insufficient documentation

## 2013-12-01 DIAGNOSIS — E039 Hypothyroidism, unspecified: Secondary | ICD-10-CM | POA: Insufficient documentation

## 2013-12-01 DIAGNOSIS — G911 Obstructive hydrocephalus: Secondary | ICD-10-CM | POA: Insufficient documentation

## 2013-12-01 DIAGNOSIS — I10 Essential (primary) hypertension: Secondary | ICD-10-CM | POA: Insufficient documentation

## 2013-12-01 DIAGNOSIS — R071 Chest pain on breathing: Secondary | ICD-10-CM | POA: Insufficient documentation

## 2013-12-27 ENCOUNTER — Encounter: Payer: Self-pay | Admitting: Family Medicine

## 2013-12-27 ENCOUNTER — Ambulatory Visit (INDEPENDENT_AMBULATORY_CARE_PROVIDER_SITE_OTHER): Payer: BC Managed Care – PPO | Admitting: Family Medicine

## 2013-12-27 DIAGNOSIS — I10 Essential (primary) hypertension: Secondary | ICD-10-CM

## 2013-12-27 DIAGNOSIS — E785 Hyperlipidemia, unspecified: Secondary | ICD-10-CM

## 2013-12-27 NOTE — Patient Instructions (Addendum)
-   Book recommendation:  Being Mortal by Eda Keys.    - "What we want for ourselves is autonomy.  What we want for our parents is safety."     (Autonomy = independence or freedom, as of the will or one's actions:  the autonomy of the individual.)  - GOALS:  1. Eat at least 3 meals and 1-2 snacks per day.  Aim for no more than 5 hours between eating.  2. Include protein and vegetables and/or fruit with each meal.    3. Walk 15 minutes per day.   - Record your progress on your GOALS SHEET, and bring to your follow-up appt with Dr. Skeet Simmer and me July 30 at 9:30 AM.    - To control blood pressure:  - Take med's as prescribed.   - Try to stay calm.   - Limit sodium:  Reduce use of canned foods, takeout and restaurant foods, salty snack foods, condiments.    - Increase potassium, which means eat fruits and vegetables, preferably some at each meal.  - If you can keep your BP under control, maybe you can get into the cardiac rehab program.

## 2013-12-27 NOTE — Progress Notes (Signed)
Medical Nutrition Therapy:  Appt start time: 5993 end time:  1630.  Assessment:  Primary concerns today: Weight management.   Morgan Collins will be moving in with her daughter by August, which causes her some stress that she is losing her independence.  Although her daughter is helpful, Morgan Collins feels her daughter is making all her decisions for her.  She finds it hard to accept her current inability to care for herself, as she is used to and enjoys providing for others.    She admits that it is difficult for her to feel motivated to make the lifestyle changes she needs to improve her health.  She said she sometimes eats for comfort, and another real obstacle for her is that she is not especially interested in cooking for herself.  She was very resistant to the idea of any vegetables that require much preparation, especially frozen veg's (prefers canned).    She has not followed recommendations from last appt except for eating 3 meals a day on most days.  Eating times have continued to be inconsistent, however.  Morgan Collins said she is fearful of walking by herself, and has not found anyone to walk with her.    Progress Towards Goal(s):  In progress.   Nutritional Diagnosis:  NI-5.8.2 Excessive carbohydrate intake As related to beverages.  As evidenced by usual intake of several sweet drinks per day.    Intervention:  Nutrition education.  Monitoring/Evaluation:  Dietary intake, exercise, and body weight in 2 week(s) during Nutrition Clinic with Dr. Skeet Simmer.

## 2013-12-28 ENCOUNTER — Telehealth: Payer: Self-pay | Admitting: Family Medicine

## 2013-12-28 DIAGNOSIS — I639 Cerebral infarction, unspecified: Secondary | ICD-10-CM

## 2013-12-28 MED ORDER — ADULT BLOOD PRESSURE CUFF LG KIT: 1.0000 | PACK | Freq: Every day | Status: DC

## 2013-12-28 NOTE — Telephone Encounter (Signed)
Message copied by Bernadene Bell on Tue Dec 28, 2013  7:54 AM ------      Message from: Kennith [facility]      Created: Mon Dec 27, 2013  4:42 PM      Regarding: Patient for [facility],       [name] will be coming in when you are in [facility] w/ me July 30.  She brings some challenges for sure!            A question for you:  Can you refer her to cardiac rehab?  I am not sure of criteria, but if she qualifies, I think it would do her a lot of good. I just don't think she's going to exercise at home; currently too depressed re. her disability to get motivated.              Warning:  She also would like a Rx for a BP cuff.             Jeannie ------

## 2013-12-29 ENCOUNTER — Telehealth: Payer: Self-pay | Admitting: Family Medicine

## 2013-12-29 NOTE — Telephone Encounter (Signed)
Pt called and needs refills on her BP medication called in. jw

## 2013-12-30 MED ORDER — HYDROCHLOROTHIAZIDE 12.5 MG PO TABS: 12.5000 mg | ORAL_TABLET | Freq: Every day | ORAL | Status: DC

## 2013-12-30 MED ORDER — LISINOPRIL 40 MG PO TABS: 40.0000 mg | ORAL_TABLET | Freq: Every day | ORAL | Status: DC

## 2013-12-30 NOTE — Telephone Encounter (Signed)
It is more helpful if patient lets [place] know earlier than an "immediate" need

## 2013-12-30 NOTE — Telephone Encounter (Signed)
Morgan Collins called back to ask that the bp med be refilled today because she is out of them and need to have before end of clinic.

## 2014-01-13 ENCOUNTER — Encounter: Payer: Self-pay | Admitting: Family Medicine

## 2014-01-13 ENCOUNTER — Ambulatory Visit (INDEPENDENT_AMBULATORY_CARE_PROVIDER_SITE_OTHER): Payer: BC Managed Care – PPO | Admitting: Family Medicine

## 2014-01-13 VITALS — Ht 62.0 in | Wt 235.3 lb

## 2014-01-13 DIAGNOSIS — I10 Essential (primary) hypertension: Secondary | ICD-10-CM

## 2014-01-13 DIAGNOSIS — E669 Obesity, unspecified: Secondary | ICD-10-CM

## 2014-01-13 NOTE — Patient Instructions (Addendum)
Ms Giel it was great to see you today!  I am pleased to hear that things are going well for you.  Here are some goals that we discussed today: 1. Goal to increase physical activity up to 3 days a week; at least 15 minutes at a time. Either by spending time with your daughter in law or by joining a gym 2. Check out a work out video at either [employer] or google them online on [employer] for free 3. Plan to incorporate breakfast into your daily regimen on most days of the week; either with cooked grits and egg whites or whole wheat bagel halves with peanut butter 4. Continue to limit the number of sugary beverages in diet as you have been doing; either by mixing half and half in water or cutting the quantity  Looking forward to seeing you soon Keep appointment with Dr. Jenne Campus on August 27th @ 10:30 Bernadene Bell, MD

## 2014-01-13 NOTE — Progress Notes (Signed)
Patient ID: Morgan Collins, female   DOB: 10/12/1955, 58 y.o.   MRN: [id number] Nutrition Clinic Visit  Pt seen in nutrition clinic with Dr. Jenne Campus. Pt presents to discuss nutrition in light of her obesity.  Goals from last visit- Goals:  1. Eat at least 3 meals and 1-2 snacks per day. Aim for no more than 5 hours between eating.  2. Include protein and vegetables and/or fruit with each meal.  3. Walk 15 minutes per day.   *Pt instructed to bring goals sheet to f/up apptmt on July 30th Also discussed ways to control her blood pressure at that time Achievement: Having difficulty exercising in the middle of the day. 0-1 days of week with 15 minutes of physical activity  5/7 days with fruits and vegetables in meals during the day Will eat cut up apple at night time    Has not been as physically active as she wanted because she feels tired after large heavy breakfast meal. Often wants to go to sleep in the morning but has stopped napping recently.  C/o some intermittent chest tightness, particularly when laying down at night time. Has never had stress test or ECHO before.   24-hr recall: (Up at 745 AM) B (9 AM)-   Had bowl of cereal 1 cup (frosted flakes); 2% milk 1/2 cup L (PM)-   Skipped  Snk (? PM)-   1-2X 12oz (approx 20)bottle of strawberry soda  D (645 PM)-   Fried flounder with cornbread mix, corn, 1 cup of french fries  Snk ( PM)-   1/2 whole Apple slices and 1 reeses cup   This (was) a typical day for her. Usually eats fried chicken or stewed chicken throughout the week   Recommendations: 1. Goal to increase physical activity up to 3 days a week; at least 15 minutes at a time. Either by spending time with your daughter in law or by joining a gym 2. Check out a work out video at either [employer] or google them online on [employer] for free 3. Plan to incorporate breakfast into your daily regimen on most days of the week; either with cooked grits and egg whites or whole  wheat bagel halves  4. Continue to limit the number of sugary beverages in diet as you have been doing; either by mixing half and half in water or cutting the quantity  Follow up in nutrition clinic with Dr. Jenne Campus on August 27th at 10:30am.  Langston Masker, MD Family Medicine PGY-2  30 minutes of direct face-to-face time was spent with the patient with at least 50% of this time spent counseling the patient.

## 2014-01-19 ENCOUNTER — Encounter: Payer: Self-pay | Admitting: Family Medicine

## 2014-01-19 ENCOUNTER — Ambulatory Visit (INDEPENDENT_AMBULATORY_CARE_PROVIDER_SITE_OTHER): Payer: BC Managed Care – PPO | Admitting: Family Medicine

## 2014-01-19 ENCOUNTER — Ambulatory Visit (HOSPITAL_COMMUNITY)
Admission: RE | Admit: 2014-01-19 | Discharge: 2014-01-19 | Disposition: A | Discharge status: Home / Self Care | Payer: BC Managed Care – PPO | Source: Ambulatory Visit | Attending: Family Medicine | Admitting: Family Medicine

## 2014-01-19 VITALS — BP 128/86 | HR 65 | Temp 98.2°F | Wt 240.0 lb

## 2014-01-19 DIAGNOSIS — I208 Other forms of angina pectoris: Secondary | ICD-10-CM

## 2014-01-19 DIAGNOSIS — R079 Chest pain, unspecified: Secondary | ICD-10-CM

## 2014-01-19 DIAGNOSIS — I209 Angina pectoris, unspecified: Secondary | ICD-10-CM

## 2014-01-19 DIAGNOSIS — R0600 Dyspnea, unspecified: Secondary | ICD-10-CM | POA: Insufficient documentation

## 2014-01-19 DIAGNOSIS — R0789 Other chest pain: Secondary | ICD-10-CM | POA: Diagnosis not present

## 2014-01-19 LAB — PRO B NATRIURETIC PEPTIDE: Pro B Natriuretic peptide (BNP): 21.96 pg/mL (ref <126)

## 2014-01-19 LAB — TROPONIN I: Troponin I: <0.3 ng/mL (ref <0.06)

## 2014-01-19 NOTE — Assessment & Plan Note (Addendum)
EKG unchanged in clinic Stat trop BNP ECHO Cards referral for potential stress test  HEART score likely 2-3 pending Troponin Appears to be anxiety related, given sudden development in office Doubt ACS Potentially component of CHF Pt given reasons to rtc Not ASA candidate given hemorrhagic stroke

## 2014-01-19 NOTE — Patient Instructions (Addendum)
Ms Barrell it was great to see you today!  I am sorry to hear about your chest discomfort We will call and let you know if any of the tests look abnormal. If you start experiencing worsening or persistent pain or it is accompanied by nausea, vomiting or dizziness please call 911 right away  Keep appointment for follow up with cardiology  Looking forward to seeing you soon Bernadene Bell, MD   [phone] Adriana Mccallum)

## 2014-01-19 NOTE — Progress Notes (Signed)
Patient ID: Morgan Collins, female   DOB: 1955-07-25, 58 y.o.   MRN: [id number]   [facility] Morgan Bell, MD Phone: [phone]  Subjective:  Morgan Collins is a 58 y.o F who presents for chest discomfort  #Chest Pain: Patient complains of chest pain. Onset was several minutes ago, with unchanged course since that time. The patient describes the pain as intermittent, dull in nature, non radiation. Pt rates pain as a 6/10 in intensity.  Associated symptoms are dyspnea. Aggravating factors are breathing, stress.  Alleviating factors are: "shot of caffeine/coke". Patient's cardiac risk factors are hypertension, obesity (BMI >= 30 kg/m2) mother with CHF.  Patient's risk factors for DVT/PE: none. Previous cardiac testing: EKG.  DOE with 1/2 block -night time cough -compliant with BP meds -currently seeing Morgan Collins for nutrition management -cannot take ASA 2/2 hemorrhagic stroke   All relevant systems were reviewed and were negative unless otherwise noted in the HPI  Past Medical History Patient Active Problem List   Diagnosis Date Noted  . Obesities, morbid 11/22/2013  . Reactive depression 10/20/2013  . Other and unspecified hyperlipidemia 08/18/2013  . Unspecified hypothyroidism 08/18/2013  . Thalamic hemorrhage 08/04/2013  . Stroke 07/27/2013  . ICH (intracerebral hemorrhage) 07/27/2013  . Essential hypertension, benign 07/27/2013  . Obstructive hydrocephalus 07/27/2013   Reviewed problem list.  Medications- reviewed and updated Chief complaint-noted No additions to family history Social history- patient is a never smoker  Objective: BP 128/86  Pulse 65  Temp(Src) 98.2 F (36.8 C) (Oral)  Wt 240 lb (108.863 kg) Gen: NAD, alert, cooperative with exam HEENT: NCAT, EOMI, PERRL, TMs nml Neck: FROM, supple CV: RRR, good S1/S2, no murmur, cap refill <3, JVD to mid neck Resp: CTABL, no wheezes, non-labored Abd: SNTND, BS present, no guarding or  organomegaly Ext: 1-2+ pitting edema bilat, warm, normal tone, moves UE/LE spontaneously Neuro: Alert and oriented, No gross deficits Skin: no rashes no lesions  Assessment/Plan: See problem based a/p

## 2014-02-08 ENCOUNTER — Ambulatory Visit (INDEPENDENT_AMBULATORY_CARE_PROVIDER_SITE_OTHER): Payer: BC Managed Care – PPO | Admitting: Cardiovascular Disease

## 2014-02-08 ENCOUNTER — Encounter: Payer: Self-pay | Admitting: Cardiovascular Disease

## 2014-02-08 VITALS — BP 110/92 | HR 100 | Ht 62.0 in | Wt 238.0 lb

## 2014-02-08 DIAGNOSIS — R0789 Other chest pain: Secondary | ICD-10-CM

## 2014-02-08 NOTE — Patient Instructions (Addendum)
              [facility] Low Glycemic Diet (Source: [facility], 2006) Low Glycemic Foods (20-49) (Decrease risk of developing heart disease) Breakfast Cereals: All-Bran All-Bran Fruit 'n Oats Fiber One Oatmeal (not instant) Oat bran Fruits and fruit juices: (Limit to 1-2 servings per day) Apples Apricots (fresh & dried) Blackberries Blueberries Cherries Cranberries Peaches Pears Plums Prunes Grapefruit Raspberries Strawberries Tangerine Apple juice Grapefruit juice Tomato juice Beans and legumes (fresh-cooked): Black-eyed peas Butter beans Chick peas Lentils  Green beans Lima beans Kidney beans Navy beans Pinto beans Snow peas Non-starchy vegetables: Asparagus, avocado, broccoli, cabbage, cauliflower, celery, cucumber, greens, lettuce, mushrooms, peppers, tomatoes, okra, onions, spinach, summer squash Grains: Barley Bulgur Rye Wild rice Nuts and oils : Almonds Peanuts Sunflower seeds Hazelnuts Pecans Walnuts Oils that are liquid at room temperature Dairy, fish, meat, soy, and eggs: Milk, skim Lowfat cheese Yogurt, lowfat, fruit sugar sweetened Lean red meat Fish  Skinless chicken & [place] Shellfish Egg whites (up to 3 daily) Soy products  Egg yolks (up to 7 or _____ per week) Moderate Glycemic Foods (50-69) Breakfast Cereals: Bran Buds Bran Chex Just Right Mini-Wheats  Special K Swiss muesli Fruits: Banana (under-ripe) Dates Figs Grapes Kiwi Mango Oranges Raisins Fruit Juices: Cranberry juice Orange juice Beans and legumes: [place]-type baked beans Canned pinto, kidney, or navy beans Green peas Vegetables: Beets Carrots  Sweet potato Yam Corn on the cob Breads: Pita (pocket) bread Oat bran bread Pumpernickel bread Rye bread Wheat bread, high fiber  Grains: Cornmeal Rice, brown Rice, white Couscous Pasta: Macaroni Pizza, cheese Ravioli, meat filled Spaghetti, white  Nuts: Cashews Macadamia Snacks: Chocolate Ice  cream, lowfat Muffin Popcorn High Glycemic Foods (70-100)  Breakfast Cereals: Cheerios Corn Chex Corn Flakes Cream of Wheat Grape Nuts Grape Nut Flakes Grits Nutri-Grain Puffed Rice Puffed Wheat Rice Chex Rice Krispies Shredded Wheat Team Total Fruits: Pineapple Watermelon Banana (over-ripe) Beverages: Sodas, sweet tea, pineapple juice Vegetables: Potato, baked, boiled, fried, mashed [place] fries Canned or frozen corn Parsnips Winter squash Breads: Most breads (white and whole grain) Bagels Bread sticks Bread stuffing Kaiser roll Dinner rolls Grains: Rice, instant Tapioca, with milk Candy and most cookies Snacks: Donuts Corn chips Jelly beans Pretzels Pastries            Your physician recommends that you schedule a follow-up appointment in: as needed with Dr. Acie Fredrickson

## 2014-02-08 NOTE — Progress Notes (Signed)
Morgan Collins Date of Birth  June 16, 1956       [facility]    [employer] [address], [address], [place]  [address]   [place], [place]  [address] [place]   Fax  [phone]     Fax [phone]  Problem List: 1.  Chest pain: 2. History of Thalamic  Hemorrhage/stroke ( Feb. 2015)  3. Essential hypertension 4. Morbid obesity   History of Present Illness:  Morgan Collins is a 58 yo with HTN, obesity, who is referred for further eval for chest pain. She has had CP for the past several months.  Occur off and on since she had her stroke in February.  The pains seem to be related to anxiety every multiple issues.   She has not had an episodes of CP with  Exertion.  No regular exercise. Exercises occasionally with her grandson.  1 pain was associated with some next tightness.  No associated with movenent or deep breath.    Not associated with dizziness,   Current Outpatient Prescriptions on File Prior to Visit  Medication Sig Dispense Refill  . Acetaminophen (TYLENOL PO) Take 325 mg by mouth as needed.       Marland Kitchen atorvastatin (LIPITOR) 80 MG tablet Take 1 tablet (80 mg total) by mouth daily.  90 tablet  3  . Blood Pressure Monitoring (ADULT BLOOD PRESSURE CUFF LG) KIT 1 kit by Does not apply route daily.  1 each  0  . citalopram (CELEXA) 10 MG tablet Take 1 tablet (10 mg total) by mouth at bedtime.  30 tablet  2  . hydrochlorothiazide (HYDRODIURIL) 12.5 MG tablet Take 1 tablet (12.5 mg total) by mouth daily.  90 tablet  3  . levothyroxine (SYNTHROID, LEVOTHROID) 100 MCG tablet Take 1 tablet (100 mcg total) by mouth daily.  90 tablet  3  . lisinopril (PRINIVIL,ZESTRIL) 40 MG tablet Take 1 tablet (40 mg total) by mouth daily.  30 tablet  3   No current facility-administered medications on file prior to visit.    No Known Allergies  Past Medical History  Diagnosis Date  . Hypertension   . Thyroid disease     . High cholesterol   . Depressed     Past Surgical History  Procedure Laterality Date  . Appendectomy    . C sections    . Hysterotomy      History  Smoking status  . Never Smoker   Smokeless tobacco  . Not on file    History  Alcohol Use No    No family history on file.  Reviw of Systems:  Reviewed in the HPI.  All other systems are negative.  Physical Exam: Blood pressure 110/92, pulse 100, height _0  (1.575 m), weight 238 lb (107.956 kg). Wt Readings from Last 3 Encounters:  02/08/14 238 lb (107.956 kg)  01/19/14 240 lb (108.863 kg)  01/13/14 235 lb 4.8 oz (106.731 kg)     General: Well developed, well nourished, in no acute distress.  Head: Normocephalic, atraumatic, sclera non-icteric, mucus membranes are moist,   Neck: Supple. Carotids are 2 + without bruits. No JVD   Lungs: Clear   Heart: RR, distant HS, normal S1S2  Abdomen: Soft, non-tender, non-distended with normal bowel sounds.  Msk:  Strength and tone are normal   Extremities: No clubbing or cyanosis. No edema.  Distal pedal pulses are 2+ and equal  Neuro: CN II - XII intact.  Alert and oriented X 3.   Psych:  Normal   ECG: January 19, 2014:  NSR at 65.  Voltage for LVH.   Assessment / Plan:

## 2014-02-08 NOTE — Assessment & Plan Note (Addendum)
Her chest pain is very atypical.  She does not have any exertional CP but has CP with anxiety.    Her HEART SCOTE is 1.   We discussed doing a treadmill but the pre-test probabilty  Is very low.   She has LVH on her baseline ECG to a regular treadmill would not likely be adequate.  She would need a Lexiscan myview and I dont think it would be approaved.      At this point, I do not think she needs a stress test but I have strongly encouraged her to watch her carbs and to exercise on a regular basis.  If she has any CP while exercising, she has been told to call us right away.    I will see her on an as needed basis.   She will follow up with her medical doctor in the family practice office.

## 2014-02-10 ENCOUNTER — Encounter: Payer: Self-pay | Admitting: Family Medicine

## 2014-02-10 ENCOUNTER — Ambulatory Visit (INDEPENDENT_AMBULATORY_CARE_PROVIDER_SITE_OTHER): Payer: BC Managed Care – PPO | Admitting: Family Medicine

## 2014-02-10 VITALS — Ht 62.0 in | Wt 238.8 lb

## 2014-02-10 DIAGNOSIS — I10 Essential (primary) hypertension: Secondary | ICD-10-CM | POA: Diagnosis not present

## 2014-02-10 NOTE — Patient Instructions (Signed)
Ms Morgan Collins it was great to see you today!   Here are some goals that we discussed today: 1. Work to decrease soda/sugar sweetened beverages from 12 oz to 0 oz per day.  Eat snacks that include fruit, celery/peanut butter  2. TASTE PREFERENCES ARE LEARNED.  This means that the more you may eat something, the more you may like it (fruits/vegetables).   3. Please try most days of the week to eat 3 meals per day, with two snacks.  As well, eat within one hour of awakening.   4. Walking 15 minutes per day, at least 3 days of the week to help bring your blood pressure down to prevent another stroke.    Looking forward to seeing you soon Keep appointment with Dr. Jenne Campus on Monday October 5th @ 10 AM.  Dr. Awanda Mink and Dr. Jenne Campus

## 2014-02-10 NOTE — Progress Notes (Signed)
Patient ID: Morgan Collins, female   DOB: December 18, 1955, 58 y.o.   MRN: [id number] Nutrition Clinic Visit  Pt seen in nutrition clinic with Dr. Jenne Campus. Pt presents to discuss nutrition in light of her obesity.  Goals from last visit- Goals:  1. Eat at least 3 meals and 1-2 snacks per day.  2. Include protein and vegetables and/or fruit with each meal.       (Protein:  Meat, fish, eggs, poultry, beans, dairy foods.) 3. Physical activity at least 15 minutes per session, 3 x per week.     *Pt instructed to bring goals sheet to f/u appt* Pt states that since last visit, she thinks she has done all right.  She is drinking about one soda per day which is down from "many".  In regards to her diet, pt has eaten vegetables but has not been able to follow the rest of her goal sheet.      24-hr recall: (Up at 9 AM) B (10 AM)-   [place] sausage and egg sandwich on toast  L (2 PM)-   Two hot dogs with buns and waffle fries 20 oz strawberry kiwi juice Snk (N/A)-   N/A D (7 PM)-   Salad w/ eggs/carrots/cheese and french dressing.  Barbeque Beef ribs (w pieces) and macaroni and cheese (1/2 cup-1 cup) along w/ string beans (1/2 cup).   20 oz strawberry kiwi juice Snk (11 PM)-   [employer], regular size   This (was) a typical day for her.  Recommendations: 1. Work to decrease soda/sugar sweetened beverages from 12 oz to 0 oz per day.  Eat snacks that include fruit, celery/peanut butter  2. TASTE PREFERENCES ARE LEARNED.  This means that the more you may eat something, the more you may like it (fruits/vegetables).   3. Please try most days of the week to eat 3 meals per day, with two snacks.  As well, eat within one hour of awakening.   4. Walking 15 minutes per day, at least 3 days of the week to help bring your blood pressure down to prevent another stroke.    Follow up in nutrition clinic with Dr. Jenne Campus on Monday October 5th @ 10 AM.  Morgan Collins. Morgan Guillot, DO of [facility] 02/10/2014, 10:35  AM    30 minutes of direct face-to-face time was spent with the patient with at least 50% of this time spent counseling the patient.

## 2014-02-14 ENCOUNTER — Telehealth: Payer: Self-pay

## 2014-02-14 NOTE — Telephone Encounter (Signed)
Patient is requesting a refill on Citalopram. Patient was last seen 4/21, needs a appt before refill.

## 2014-02-14 NOTE — Telephone Encounter (Signed)
Attempted to contact patient. Left a message to inform patient that she would need to make a appt for refills.

## 2014-02-16 ENCOUNTER — Telehealth: Payer: Self-pay

## 2014-02-16 NOTE — Telephone Encounter (Signed)
Patient is requesting a refill on Celexa. Attempted to contact patient to inform her she would need a appt for refills. No answer nor voicemail.

## 2014-02-18 ENCOUNTER — Telehealth: Payer: Self-pay

## 2014-02-18 NOTE — Telephone Encounter (Signed)
Patient is requesting a refill on Celexa. Patient needs a appt before any refills. Patient is aware.

## 2014-02-22 ENCOUNTER — Telehealth: Payer: Self-pay

## 2014-02-22 NOTE — Telephone Encounter (Signed)
Patient is requesting a refill on Celexa. Patient has been informed several times she needs a appt before any refills. Attempted to contact patient. No answer nor voicemail.

## 2014-03-01 ENCOUNTER — Encounter: Payer: BC Managed Care – PPO | Admitting: Physical Medicine & Rehabilitation

## 2014-03-02 ENCOUNTER — Encounter
Payer: BC Managed Care – PPO | Attending: Physical Medicine & Rehabilitation | Admitting: Physical Medicine & Rehabilitation

## 2014-03-02 ENCOUNTER — Encounter: Payer: Self-pay | Admitting: Physical Medicine & Rehabilitation

## 2014-03-02 VITALS — BP 116/62 | HR 79 | Resp 14 | Wt 242.8 lb

## 2014-03-02 DIAGNOSIS — R0789 Other chest pain: Secondary | ICD-10-CM | POA: Insufficient documentation

## 2014-03-02 DIAGNOSIS — E785 Hyperlipidemia, unspecified: Secondary | ICD-10-CM | POA: Diagnosis not present

## 2014-03-02 DIAGNOSIS — I610 Nontraumatic intracerebral hemorrhage in hemisphere, subcortical: Secondary | ICD-10-CM

## 2014-03-02 DIAGNOSIS — F341 Dysthymic disorder: Secondary | ICD-10-CM

## 2014-03-02 DIAGNOSIS — R0602 Shortness of breath: Secondary | ICD-10-CM | POA: Insufficient documentation

## 2014-03-02 DIAGNOSIS — F329 Major depressive disorder, single episode, unspecified: Secondary | ICD-10-CM

## 2014-03-02 DIAGNOSIS — E039 Hypothyroidism, unspecified: Secondary | ICD-10-CM | POA: Diagnosis not present

## 2014-03-02 DIAGNOSIS — I619 Nontraumatic intracerebral hemorrhage, unspecified: Secondary | ICD-10-CM | POA: Insufficient documentation

## 2014-03-02 MED ORDER — CITALOPRAM HYDROBROMIDE 10 MG PO TABS: 10.0000 mg | ORAL_TABLET | Freq: Every day | ORAL | Status: DC

## 2014-03-02 NOTE — Patient Instructions (Signed)
FIND A ROUTINE AND SCHEDULE FOR EVERY DAY.  I DON'T WANT YOU SPENDING ENTIRE DAYS ON THE COUCH OR IN THE BED!!!!  CONTINUE TO WORK ON YOUR DIET  AVOID DAY TIME NAPS!!!  IF YOUR SHORTNESS OF BREATH DOESN'T IMPROVE OVER THE NEXT FEW WEEKS, YOU SHOULD FOLLOW UP WITH DR. Skeet Simmer   PLEASE CALL ME WITH ANY PROBLEMS OR QUESTIONS (#[phone]).

## 2014-03-02 NOTE — Progress Notes (Signed)
Subjective:    Patient ID: Morgan Collins, female    DOB: [birth year], 58 y.o.   MRN: [id number]  HPI  Mrs. Kangas is back regarding her thalamic hemorrhage. She ran out of her celexa this summer and realized that it was really helping her. She is more tearful and sad in general.   She is trying to keep up with basic walking/exercise but she finds it strenuous. She gets out of breath quickly and doesn't have the energy to keep up with her friends.    Pain Inventory Average Pain 0 Pain Right Now 0 My pain is no pain  In the last 24 hours, has pain interfered with the following? General activity 0 Relation with others 0 Enjoyment of life 0 What TIME of day is your pain at its worst? no pain Sleep (in general) Fair  Pain is worse with: no pain Pain improves with: no pain Relief from Meds: no pain  Mobility walk without assistance  Function not employed: date last employed .  Neuro/Psych depression  Prior Studies Any changes since last visit?  no  Physicians involved in your care Any changes since last visit?  no   History reviewed. No pertinent family history. History   Social History  . Marital Status: Single    Spouse Name: N/A    Number of Children: 2  . 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 Smoking status: Never Smoker   . Smokeless tobacco: None  . Alcohol Use: No  . Drug Use: None  . Sexual Activity: None   Other Topics Concern  . None   Social History Narrative   Patient lives at home patient drinks 2 sodas daily.   Past Surgical History  Procedure Laterality Date  . Appendectomy    . C sections    . Hysterotomy     Past Medical History  Diagnosis Date  . Hypertension   . Thyroid disease   . High cholesterol   . Depressed    BP 116/62  Pulse 79  Resp 14  Wt 242 lb 12.8 oz (110.133 kg)  SpO2 98%  Opioid Risk Score:   Fall Risk Score: Low Fall Risk (0-5  points) (previoulsy educated and declined handout) Review of Systems  Psychiatric/Behavioral: Positive for dysphoric mood.  All other systems reviewed and are negative.      Objective:   Physical Exam Constitutional: She appears well-developed.  HENT: dentition fair  Eyes: EOM are normal.  Neck: Normal range of motion. Neck supple. No thyromegaly present.  Cardiovascular: Normal rate and regular rhythm.  Respiratory: Effort normal and breath sounds normal. No respiratory distress.  GI: Soft. Bowel sounds are normal. She exhibits no distension.  Neurological: She is alert.  Mood is flat   Very alert. Moves all 4's. Strength 5/5 grossly RUE and RLE and  5/5 left bicep, tricep, deltoid, wrist, hand-- 4+ to 5/5 left HF, KE, ankle. Appears to have intact pain and light touch. Balance is normal. Romberg negative. Able to walk heel to toe.    Skin: Skin is warm and dry. Scalp incision clean Psychiatric:  Cooperative. still flat.    Assessment/Plan:  1. Right thalamic hemorrhage with hydrocephalus.    2. Sleep-wake cycle--discussed the fact that she needs to avoid naps during the day. Needs to find daily activities to occupy her every day---not just the day she exercises.  3. Pain Management: tylenol prn,  heat/ice for left chest wall pain--(may be pec minor spasm)  4. Depression: will resume celexa 10mg  qhs  5. Hypothyroid:   Continue supplement   182mcg.   -recheck thyroid panel today 6. Shortness of breath: may be sleep and motivation related. However, i asked her to follow up with Dr. Skeet Simmer if the improvement in her mood doesn't improve symptoms. She also needs to lose weight. she has gained 13lbs since i last saw her.   25 minutes of face to face patient care time were spent during this visit. All questions were encouraged and answered.

## 2014-03-03 ENCOUNTER — Other Ambulatory Visit: Payer: Self-pay | Admitting: Physical Medicine & Rehabilitation

## 2014-03-03 LAB — T4, FREE: Free T4: 1.17 ng/dL (ref 0.80–1.80)

## 2014-03-03 LAB — T4: T4, Total: 9.5 ug/dL (ref 4.5–12.0)

## 2014-03-03 LAB — T3, FREE: T3, Free: 3.4 pg/mL (ref 2.3–4.2)

## 2014-03-08 ENCOUNTER — Ambulatory Visit: Payer: BC Managed Care – PPO | Admitting: Neurology

## 2014-03-08 ENCOUNTER — Ambulatory Visit (INDEPENDENT_AMBULATORY_CARE_PROVIDER_SITE_OTHER): Payer: BC Managed Care – PPO | Admitting: Neurology

## 2014-03-08 ENCOUNTER — Telehealth: Payer: Self-pay | Admitting: Physical Medicine & Rehabilitation

## 2014-03-08 ENCOUNTER — Encounter: Payer: Self-pay | Admitting: Neurology

## 2014-03-08 VITALS — BP 98/71 | HR 92 | Wt 241.8 lb

## 2014-03-08 DIAGNOSIS — I610 Nontraumatic intracerebral hemorrhage in hemisphere, subcortical: Secondary | ICD-10-CM | POA: Insufficient documentation

## 2014-03-08 DIAGNOSIS — I6350 Cerebral infarction due to unspecified occlusion or stenosis of unspecified cerebral artery: Secondary | ICD-10-CM

## 2014-03-08 DIAGNOSIS — E785 Hyperlipidemia, unspecified: Secondary | ICD-10-CM

## 2014-03-08 DIAGNOSIS — I10 Essential (primary) hypertension: Secondary | ICD-10-CM | POA: Insufficient documentation

## 2014-03-08 DIAGNOSIS — I619 Nontraumatic intracerebral hemorrhage, unspecified: Secondary | ICD-10-CM

## 2014-03-08 DIAGNOSIS — I639 Cerebral infarction, unspecified: Secondary | ICD-10-CM

## 2014-03-08 HISTORY — DX: Nontraumatic intracerebral hemorrhage in hemisphere, subcortical: I61.0

## 2014-03-08 MED ORDER — ASPIRIN EC 81 MG PO TBEC: 81.0000 mg | DELAYED_RELEASE_TABLET | Freq: Every day | ORAL | Status: DC

## 2014-03-08 NOTE — Progress Notes (Signed)
STROKE NEUROLOGY FOLLOW UP NOTE  NAME: Morgan Collins DOB: [birth year]  REASON FOR VISIT: stroke follow up HISTORY FROM: chart  Today we had the pleasure of seeing Morgan Collins in follow-up at our Neurology Clinic. Pt was accompanied by no one.   History Summary Morgan Collins is an 58 y.o. Female with PMH of HTN, HLD, and hypothyroidism and poor compliance with taking prescribed medications was admitted on 07/27/2013 due to sudden onset HA and weakness and numbness involving left side. She had no previous history of stroke nor TIA. CT scan of her head showed right thalamic hemorrhage with extension into lateral and third ventricles with mass effect on the third ventricle and signs of early ventricle enlargement. NIH stroke score was 11. Patient became increasingly more lethargic in the emergency room. Repeat CT of her head showed a slight increase in size of her lateral ventricles indicative of early hydrocephalus, but no significant increase in size of patient's thalamic hemorrhage. Dr. Joya Salm, neurosurgeon consulted for EVD placement. Initially patient overtly confused, improved during her monitored stay in NICU. Her EVD was clamped 2/13 with an increase in ventricular size & temporal horns. Drain reopened with relief. Reclamped 3 days later with no increase in ventricular size as CT stable without evidence of hydrocephalus. EVD was removed 2/18. Hemorrhage most likely secondary to accelerated hypertension.   11/04/13 follow up (LL) - Patient residues (mild left hemiparesis, headache, vertical gaze abnormality, dysarthria) were all improved on follow up visit. Most deficits have resolved, still with mild left weakness. She is working with PCP at [facility] to keep BP under good control. Blood pressure in office today is 115/77. She endorses profound fatigue which may be due to her depression, geriatric depression score is 11. She was started on citalopram 2 weeks ago.    Interval History During the interval time, the patient has been doing well. Neurological symptoms all resolved. Her BP today is on the low side 98/71. She does not check BP at home since she did not have BP monitor device at home. She is on lisinopril and HCTZ. She stated that her depression is better, no SI or HI. She is on celexa. She still has mild HA and tylenol helps for the HA.   REVIEW OF SYSTEMS: Full 14 system review of systems performed and notable only for those listed below and in HPI above, all others are negative:  Constitutional: N/A  Cardiovascular: N/A  Ear/Nose/Throat: N/A  Skin: N/A  Eyes: N/A  Respiratory: N/A  Gastroitestinal: N/A  Genitourinary: N/A Hematology/Lymphatic: N/A  Endocrine: N/A  Musculoskeletal: N/A  Allergy/Immunology: N/A  Neurological: headache, daytime sleepiness Psychiatric: depression  The following represents the patient's updated allergies and side effects list: No Known Allergies  Labs since last visit of relevance include the following: Results for orders placed in visit on 03/03/14  T4      Result Value Ref Range   T4, Total 9.5  4.5 - 12.0 ug/dL  T4, FREE      Result Value Ref Range   Free T4 1.17  0.80 - 1.80 ng/dL    The neurologically relevant items on the patient's problem list were reviewed on today's visit.  Neurologic Examination  A problem focused neurological exam (12 or more points of the single system neurologic examination, vital signs counts as 1 point, cranial nerves count for 8 points) was performed.  Blood pressure 98/71, pulse 92, weight 241 lb 12.8 oz (109.68 kg).  General -  morbid obesity, well developed, in no apparent distress.  Ophthalmologic - Sharp disc margins OU.  Cardiovascular - Regular rate and rhythm with no murmur.  Mental Status -  Level of arousal and orientation to time, place, and person were intact. Language including expression, naming, repetition, comprehension was assessed and  found intact.  Cranial Nerves II - XII - II - Visual field intact OU. III, IV, VI - Extraocular movements intact. V - Facial sensation intact bilaterally. VII - Facial movement intact bilaterally. VIII - Hearing & vestibular intact bilaterally. X - Palate elevates symmetrically. XI - Chin turning & shoulder shrug intact bilaterally. XII - Tongue protrusion intact.  Motor Strength - The patient's strength was normal in all extremities and pronator drift was absent.  Bulk was normal and fasciculations were absent.   Motor Tone - Muscle tone was assessed at the neck and appendages and was normal.  Reflexes - The patient's reflexes were normal in all extremities and she had no pathological reflexes.  Sensory - Light touch, temperature/pinprick, vibration and proprioception, and Romberg testing were assessed and were normal.    Coordination - The patient had normal movements in the hands and feet with no ataxia or dysmetria.  Tremor was absent.  Gait and Station - slow and wide-based gait due to habitus.  Data reviewed: I personally reviewed the images and agree with the radiology interpretations.  CT head w/o contrast 07/27/13 - 11:33 - 1. Acute hemorrhage in the right thalamus with extension into the  third and right lateral ventricles and into the aqueduct of Sylvius.  2. Dilatation of the lateral ventricles consistent with obstructive  Hydrocephalus  07/27/13 - 12:36 Again noted right thalamic hemorrhage with extension into the third  ventricle and some blood in anterior horn of the right lateral  ventricle. Again noted blood in duct of Sylvius to the top of fourth  ventricle. Mild increase in right to left mass effect axial image 14  measures about 5 mm. Again noted the obstructive hydrocephalus with  minimal progression. No new focus of hemorrhage is noted.  07/29/13  1. Stable size of right the lytic hemorrhage with intraventricular  extension into the third ventricle with  small amount of blood  present within the cerebral aqueduct. Localized mass effect with  slight right-to-left midline shift at the level of the hemorrhage is  unchanged.  2. Interval placement of right frontal approach ventricular catheter  with tip near the foramen of Monro. Overall ventricular size is  decreased relative to prior exam from 07/27/2013 without CT evidence  of obstructive hydrocephalus.  07/30/13 1. Slight interval increase in size of the lateral ventricles status  post clamping of the ventricular catheter as detailed above. .  2. Stable size and appearance of right thalamic hemorrhage with  intraventricular extension. No new intracranial hemorrhage or other  process identified.  3. Nonvisualization of previously identified tiny right parietal  subdural hematoma.   08/03/13  1. Stable ventricle size and configuration. No significant  ventriculomegaly.  2. Stable small volume right thalamic hemorrhage. No  intraventricular extension of blood and no significant mass effect.  MRI 07/29/13 MRI head: Right mesial thalamic hematoma with intraventricular  extension. No hydrocephalus, right frontal ventriculostomy catheter  in situ. 1-2 mm right parietal subdural hematoma may be post  procedural.  Symmetric abnormal cerebellar signal abnormality with volume loss,  in addition to left occipital remote hemorrhage, constellation of  findings may reflect sequelae of PRES, though are nonspecific.  Scattered intracranial foci  of susceptibility artifact/micro  hemorrhages may reflect chronic hypertension.  MRA head: Dolicoectatic appearance of the intracranial vessels  suggest sequela of chronic hypertension. No hemodynamically  significant luminal irregularity of the right M2/3 branches suggest  intracranial atherosclerosis.   Component     Latest Ref Rng 08/02/2013 08/18/2013 03/02/2014 03/03/2014  Cholesterol     0 - 200 mg/dL 343 (H)     Triglycerides     <150 mg/dL 97      HDL     >39 mg/dL 61     Total CHOL/HDL Ratio      5.6     VLDL     0 - 40 mg/dL 19     LDL (calc)     0 - 99 mg/dL 263 (H)     T4, Total     5.0 - 12.5 ug/dL  6.1    T3 Uptake     22.5 - 37.0 %  27.5    Free Thyroxine Index     1.0 - 3.9  1.7    TSH     0.350 - 4.500 uIU/mL  18.807 (H)    T3, Free     2.3 - 4.2 pg/mL   3.4   Free T4     0.80 - 1.80 ng/dL    1.17    Assessment: As you may recall, she is a 58 y.o. African American female with PMH of HTN, HLD, hypothyroidism followed up in clinic for right thalamic hemorrhage with extension to ventricles. She had EVD for the bleeding due to concern of hydrocephalus. The hemorrhage was presumed to be due to uncontrolled HTN by location, but I checked her BP on admission in 07/2013, which was not high. Her MRI report showed scattered intracranial foci of susceptibility artifact/micro hemorrhages may reflect chronic hypertension, but is not very impressive. However, she does have high LDL at 263 in Feb. Currently BP in good control and even on the low side. She needs to continue monitor BP at home. Since bleeding has been more than 6 month, I will put her on baby ASA for stroke prevention. Continue lipitor.   Plan:  - continue lipitor for HLD and stroke prevention - start ASA 81mg  for stroke prevention since your bleeding has been more than 6 months. -follow up with PCP for stroke risk factor modification, especially to monitor LDL trend after taking lipitor - request a prescription of home BP monitoring device and monitor BP at home. - tylenol PRN for headache. - follow up in clinic in 3 months. - may consider repeat MRI / MRA and sleep study next visit.   Patient Instructions  - continue lipitor for HLD and stroke prevention - will start baby ASA for stroke prevention since your bleeding has been more than 6 months. - please make an appointment with your PCP for stroke risk factor modification - please ask your PCP for a  prescription of home BP monitoring device - monitor BP at home twice a day and record and bring over to your PCP. - tylenol PRN for headache. - follow up in clinic in 3 months.   Rosalin Hawking, MD PhD [facility] [address] [place], [place] [address] [phone]

## 2014-03-08 NOTE — Patient Instructions (Signed)
-   continue lipitor for HLD and stroke prevention - will start baby ASA for stroke prevention since your bleeding has been more than 6 months. - please make an appointment with your PCP for stroke risk factor modification - please ask your PCP for a prescription of home BP monitoring device - monitor BP at home twice a day and record and bring over to your PCP. - tylenol PRN for headache. - follow up in clinic in 3 months.

## 2014-03-08 NOTE — Telephone Encounter (Signed)
Let Morgan Collins know that her thyroid tests are normal. thnks

## 2014-03-09 NOTE — Telephone Encounter (Signed)
Notified Mrs Kuchenbecker testes were normal.

## 2014-03-10 ENCOUNTER — Ambulatory Visit: Payer: BC Managed Care – PPO | Admitting: Family Medicine

## 2014-03-21 ENCOUNTER — Encounter: Payer: Self-pay | Admitting: Family Medicine

## 2014-03-21 ENCOUNTER — Ambulatory Visit (INDEPENDENT_AMBULATORY_CARE_PROVIDER_SITE_OTHER): Payer: BC Managed Care – PPO | Admitting: Family Medicine

## 2014-03-21 ENCOUNTER — Ambulatory Visit: Payer: BC Managed Care – PPO | Admitting: Family Medicine

## 2014-03-21 VITALS — Ht 62.5 in | Wt 238.1 lb

## 2014-03-21 DIAGNOSIS — I10 Essential (primary) hypertension: Secondary | ICD-10-CM

## 2014-03-21 NOTE — Patient Instructions (Addendum)
-   GET YOUR ASPIRIN, AND START TAKING IT!  - You want a BUFFERED aspirin at 81-mg dosage.   - To microwave veg's, you do NOT need the "Steamables," are twice as expensive.  Just use regular frozen (or fresh) veg's:  Pour them in a bowl, cover with a plate, and microwave on high power till done (1-3 minutes per serving).    GOALS TODAY (Lose weight, feel better, not short of breath): - Do some kind of physical activity at least a total of 45 min 3 X wk, and at least 5 min of some kind of movement daily.  (Can be split up during the day.)  - Write down number of minutes of exercise each time on your Goals Sheet!  BRING THIS EX RECORD TO YOUR FOLLOW-UP APPT! - Eat at least 3 meals and 1-2 snacks per day.  Aim for no more than 5 hours between eating.  Eat breakfast within one hour of getting up.  - Vegetables and/or fruit with both lunch and dinner.    - Complete your Goals Sheet, and bring to follow-up appt!  - Call Dr. Jenne Campus weekly (each Sunday) to report progress on your goals.

## 2014-03-21 NOTE — Progress Notes (Signed)
Medical Nutrition Therapy:  Appt start time: 1000 end time:  1100.  Assessment:  Primary concerns today: Weight management.   Ms. Carder has a weight loss contest with a friend from church.  She has been trying to not sleep during the day, but sometimes gives in to the desire to lie down during the day.  She said her eating habits are "out of whack"; not always eating with her daughter's family.  Still not getting 3 meals a day on any days.  Ms. Switalski has also not been walking; cited the rain as an obstacle recently.  She has sometimes gone shopping with friends, which makes her out of breath.  She understands that her fitness would improve with exercise, but would like to join a gym b/c she feels she will more likely follow through this way.  (Also is afraid of walking in neighborhood b/c of dogs.)  Gym membership will be contingent on getting her license, and being able to drive, which she hopes to accomplish in the next few weeks.  We talked at length today about her long-term goals for independence, and how the health behavior changes relate to these.  Ms. Thoennes insistedt that she can accomplish the fairly stringent behavior goals she set for herself today.    24-hr recall:  (Up at ~8 AM; to church before bkfst) B (10:30 AM)-  2 [employer] cheeseburgers, water Snk ( AM)-  water L (3 PM)-  Armandina Gemma Corral: 1 c rice, 1/3 c gravy, 1 c bkeyed'd peas, chx breast, chx wing, 1 c cabbage, 1/3 c potatoes, 1/2 fried chx brst, 2 pc cornbread, 1 roll, 2 bites lima beans, 1 c ice cream, 32 oz soda, some water Snk ( PM)-  --- D ( PM)-  --- Snk ( PM)-  --- Typical day? No. Ate out meal provided by someone else; said this was her first interesting meal all week.    Progress Towards Goal(s):  In progress.   Nutritional Diagnosis:  NI-5.8.2 Excessive carbohydrate intake As related to beverages.  As evidenced by 32 oz of soda yesterday.    Intervention:  Nutrition education.  Monitoring/Evaluation:  Dietary  intake, exercise, and body weight in 6 week(s).

## 2014-05-02 ENCOUNTER — Ambulatory Visit (INDEPENDENT_AMBULATORY_CARE_PROVIDER_SITE_OTHER): Payer: BC Managed Care – PPO | Admitting: Family Medicine

## 2014-05-02 ENCOUNTER — Encounter: Payer: Self-pay | Admitting: Family Medicine

## 2014-05-02 VITALS — Ht 62.5 in | Wt 246.2 lb

## 2014-05-02 DIAGNOSIS — E785 Hyperlipidemia, unspecified: Secondary | ICD-10-CM

## 2014-05-02 DIAGNOSIS — I10 Essential (primary) hypertension: Secondary | ICD-10-CM | POA: Diagnosis not present

## 2014-05-02 NOTE — Progress Notes (Signed)
Medical Nutrition Therapy:  Appt start time: 1100 end time:  1200.  Assessment:  Primary concerns today: Weight management.   Morgan Collins is still not checking her BP at home, but checks it at [facility] occasionally.  She moved in the past month, and everything has been "crazy and out of whack."  She is uncomfortable with many aspects of her life; still frustrated that she is accountable to family members,and cannot drive yet.  Morgan Collins still lives with her daughter and her family, but feels she cannot talk to her daughter about how she feels.  There are other family members with whom there are conflicts.  Usually spends a lot of time in her room at home.  Eating is "totally messed up."  Has seldom eaten breakfast.  She is determined to take better care of herself.  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  She realizes she has been caring more for others than for herself.  She has not been consistent with her medicine, much less with meals or physical activity.  Morgan Collins went to the [employer] for chair exercise one day, but has not been back b/c it was so unchallenging.    24-hr recall:  Spent all day Sat with church kids for rehearsal and supper.  Got to bed late, but woke up ~2AM Sunday.  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  She went to the assisted living ctr just before 8AM to help out.  Returned home just in time to go to church.   (Up at 7 AM) B ( AM)-  --- Snk ( AM)-  --- L ( PM)-  --- Snk (4:30)-  16 oz Mtn Dew, 1 cookie D (5 PM)-  2 c baked spaghetti, 1 chx thigh, 1 c mac&chs, 24 oz juice Snk ( PM)-  --- Typical day? No.  There have been no "typical days"; eating schedule and intake have been erratic.    Progress Towards Goal(s):  In progress.   Nutritional Diagnosis:  [place]-3.3 Overweight/obesity As related to energy  balance.  As evidenced by BMI >40.    Intervention:  Nutrition education.  Monitoring/Evaluation:  Dietary intake, exercise, and body weight in 6 week(s).

## 2014-05-02 NOTE — Patient Instructions (Addendum)
-   [facility] class:    - [place], Art therapist (Ages 48+)  This class is designed to increase your cardiovascular and muscular endurance by using high intensity aerobic moves followed by easy recovery moves.  Tuesdays and Thursdays - 9:15-10 am - Free  - [employer] - Elyse Jarvis, Instructor (Ages 18+)  Performed mostly on the mat, this class will help you build a strong abdomen and lower back to provide a stable foundation for an active life.  Thursdays - 5:45-6:15 pm - Free - Carve out some time for yourself:  When someone asks you to do something, tell them you'll check your calendar and get back to them.  When you let them know you are busy, they don't need an explanation of why.   - Blood pressure medicine:  It's VERY important that you take these consistently!  - TODAY:  Create a plan and schedule for how you will be able to follow up with your behavior goals.  Meeting those goals and taking better care of yourself is the most important thing you can do to get yourself back on your feet and to regain your independence.    - Include in your schedule TIME FOR EXERCISE (walking or [facility] classes) even if you are not thrilled with it at first.   - Weekend:  Make your upcoming weekly schedule to include:  - Physical activity (stretching, strength, and cardio)  - Meal times  - Medicine times  - Activities I want to do  - Activities I'm helping others with  - Food shopping for meals/snacks   - Phone call to Dr. Jenne Campus   Previous Goals: GOALS TODAY (Lose weight, feel better, not short of breath): - Do some kind of physical activity at least a total of 45 min 3 X wk, and at least 5 min of some kind of movement daily. (Can be split up during the day.) - Write down number of minutes of exercise each time on your Goals Sheet! BRING THIS EX RECORD TO YOUR FOLLOW-UP APPT! - Eat at least 3 meals and 1-2 snacks per day. Aim for no more than 5 hours between  eating. Eat breakfast within one hour of getting up.  - Vegetables and/or fruit with both lunch and dinner.   - Complete your Goals Sheet, and bring to follow-up appt!  - Call Dr. Jenne Campus weekly (each Sunday) to report progress on your goals: [phone].

## 2014-05-30 ENCOUNTER — Encounter
Payer: BC Managed Care – PPO | Attending: Physical Medicine & Rehabilitation | Admitting: Physical Medicine & Rehabilitation

## 2014-05-30 ENCOUNTER — Encounter: Payer: Self-pay | Admitting: Physical Medicine & Rehabilitation

## 2014-05-30 VITALS — BP 110/70 | HR 82 | Resp 14 | Ht 63.0 in | Wt 249.0 lb

## 2014-05-30 DIAGNOSIS — I69198 Other sequelae of nontraumatic intracerebral hemorrhage: Secondary | ICD-10-CM | POA: Diagnosis not present

## 2014-05-30 DIAGNOSIS — I10 Essential (primary) hypertension: Secondary | ICD-10-CM | POA: Diagnosis not present

## 2014-05-30 DIAGNOSIS — I610 Nontraumatic intracerebral hemorrhage in hemisphere, subcortical: Secondary | ICD-10-CM

## 2014-05-30 DIAGNOSIS — F329 Major depressive disorder, single episode, unspecified: Secondary | ICD-10-CM | POA: Diagnosis not present

## 2014-05-30 DIAGNOSIS — E039 Hypothyroidism, unspecified: Secondary | ICD-10-CM | POA: Diagnosis not present

## 2014-05-30 DIAGNOSIS — R0602 Shortness of breath: Secondary | ICD-10-CM | POA: Insufficient documentation

## 2014-05-30 DIAGNOSIS — G919 Hydrocephalus, unspecified: Secondary | ICD-10-CM | POA: Insufficient documentation

## 2014-05-30 NOTE — Progress Notes (Signed)
Subjective:    Patient ID: Morgan Collins, female    DOB: Nov 30, 1955, 58 y.o.   MRN: [id number]  HPI   Mrs Spielberg is back regarding her right thalamic hemorrhage. She is still feeling fatigued, and more short of breath than anything with activity---this seems to be increasing. Her exercise is limited for the most part. She has put on some weight. (+ 7lbs since last visit 3 months ago).  She occasionally has some wheezing but it was related to a cold she had.      Pain Inventory Average Pain 1 Pain Right Now 0 My pain is intermittent, dull and aching  In the last 24 hours, has pain interfered with the following? General activity 1 Relation with others 1 Enjoyment of life 1 What TIME of day is your pain at its worst? daytime Sleep (in general) Fair  Pain is worse with: walking, bending and standing Pain improves with: rest Relief from Meds: 4  Mobility walk without assistance ability to climb steps?  yes do you drive?  no  Function not employed: date last employed .  Neuro/Psych No problems in this area  Prior Studies Any changes since last visit?  no  Physicians involved in your care Any changes since last visit?  no   History reviewed. No pertinent family history. History   Social History  . Marital Status: Single    Spouse Name: N/A    Number of Children: 2  . 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 Smoking status: Never Smoker   . Smokeless tobacco: None  . Alcohol Use: No  . Drug Use: None  . Sexual Activity: None   Other Topics Concern  . None   Social History Narrative   Patient lives at home patient drinks 2 sodas daily.   Past Surgical History  Procedure Laterality Date  . Appendectomy    . C sections    . Hysterotomy     Past Medical History  Diagnosis Date  . Hypertension   . Thyroid disease   . High cholesterol   . Depressed    Ht 5\' 3"  (1.6 m)  Wt 249  lb (112.946 kg)  BMI 44.12 kg/m2  Opioid Risk Score:   Fall Risk Score: Low Fall Risk (0-5 points)  Review of Systems  Constitutional: Negative.   HENT: Negative.   Eyes: Negative.   Respiratory: Negative.   Cardiovascular: Negative.   Gastrointestinal: Negative.   Endocrine: Negative.   Musculoskeletal: Negative.   Skin: Negative.   Allergic/Immunologic: Negative.   Neurological: Negative.   Hematological: Negative.   Psychiatric/Behavioral: Positive for dysphoric mood.       Objective:   Physical Exam  Constitutional: She appears well-developed. Has gained more weight HENT: dentition fair  Eyes: EOM are normal.  Neck: Normal range of motion. Neck supple. No thyromegaly present.  Cardiovascular: Normal rate and regular rhythm.  Respiratory: Effort normal and breath sounds normal. No respiratory distress.  GI: Soft. Bowel sounds are normal. She exhibits no distension.  Neurological: She is alert.  Mood is appropriate Very alert. Moves all 4's. Strength 5/5 grossly RUE and RLE and 5/5 left bicep, tricep, deltoid, wrist, hand.  5/5 left HF, KE, ankle. Appears to have intact pain and light touch. Balance is normal. Romberg negative. Able to walk heel to toe.  Skin: Skin is warm and dry. Scalp incision clean Psychiatric:  Cooperative. pleasant.  Assessment/Plan:  1. Right thalamic hemorrhage with hydrocephalus.  2.. Depression: continue celexa 10mg  qhs  3. Hypothyroid: Continue supplement 18mcg.  -recent thyroid testing was normal 4. Shortness of breath:  Most likely due to weight and inactivity as she has gained 20 lbs since 2 visits ago. Asked her to follow up with Dr. Skeet Simmer for an assessment as well. Discussed the initiation of a HEP starting with regular walking. 5. 25 minutes of face to face patient care time were spent during this visit. All questions were encouraged and answered. I will see her back in 3 months

## 2014-05-30 NOTE — Patient Instructions (Signed)
PLEASE CALL ME WITH ANY PROBLEMS OR QUESTIONS (#[phone]).      

## 2014-06-06 ENCOUNTER — Ambulatory Visit: Payer: BC Managed Care – PPO | Admitting: Family Medicine

## 2014-06-21 ENCOUNTER — Ambulatory Visit: Payer: BC Managed Care – PPO | Admitting: Family Medicine

## 2014-06-28 ENCOUNTER — Ambulatory Visit: Payer: BC Managed Care – PPO | Admitting: Neurology

## 2014-07-06 ENCOUNTER — Other Ambulatory Visit: Payer: Self-pay | Admitting: Family Medicine

## 2014-07-10 ENCOUNTER — Other Ambulatory Visit: Payer: Self-pay | Admitting: Family Medicine

## 2014-07-10 DIAGNOSIS — I10 Essential (primary) hypertension: Secondary | ICD-10-CM

## 2014-07-11 MED ORDER — HYDROCHLOROTHIAZIDE 12.5 MG PO TABS: 12.5000 mg | ORAL_TABLET | Freq: Every day | ORAL | Status: DC

## 2014-07-11 NOTE — Telephone Encounter (Signed)
Pt called and needs a refill on her BP medication called in. jw °

## 2014-07-26 ENCOUNTER — Encounter: Payer: Self-pay | Admitting: Family Medicine

## 2014-07-26 ENCOUNTER — Ambulatory Visit (INDEPENDENT_AMBULATORY_CARE_PROVIDER_SITE_OTHER): Payer: BLUE CROSS/BLUE SHIELD | Admitting: Family Medicine

## 2014-07-26 VITALS — BP 121/81 | HR 86 | Temp 97.9°F | Ht 63.0 in | Wt 248.0 lb

## 2014-07-26 DIAGNOSIS — E039 Hypothyroidism, unspecified: Secondary | ICD-10-CM | POA: Diagnosis not present

## 2014-07-26 DIAGNOSIS — I10 Essential (primary) hypertension: Secondary | ICD-10-CM | POA: Diagnosis not present

## 2014-07-26 DIAGNOSIS — F341 Dysthymic disorder: Secondary | ICD-10-CM | POA: Diagnosis not present

## 2014-07-26 DIAGNOSIS — F329 Major depressive disorder, single episode, unspecified: Secondary | ICD-10-CM

## 2014-07-26 DIAGNOSIS — E785 Hyperlipidemia, unspecified: Secondary | ICD-10-CM

## 2014-07-26 DIAGNOSIS — I639 Cerebral infarction, unspecified: Secondary | ICD-10-CM | POA: Diagnosis not present

## 2014-07-26 LAB — CBC WITH DIFFERENTIAL/PLATELET
Basophils Absolute: 0 10*3/uL (ref 0.0–0.1)
Basophils Relative: 0 % (ref 0–1)
Eosinophils Absolute: 0.1 10*3/uL (ref 0.0–0.7)
Eosinophils Relative: 1 % (ref 0–5)
HCT: 38.6 % (ref 36.0–46.0)
Hemoglobin: 12.6 g/dL (ref 12.0–15.0)
Lymphocytes Relative: 30 % (ref 12–46)
Lymphs Abs: 2.6 10*3/uL (ref 0.7–4.0)
MCH: 26.1 pg (ref 26.0–34.0)
MCHC: 32.6 g/dL (ref 30.0–36.0)
MCV: 80.1 fL (ref 78.0–100.0)
MPV: 10.3 fL (ref 8.6–12.4)
Monocytes Absolute: 0.7 10*3/uL (ref 0.1–1.0)
Monocytes Relative: 8 % (ref 3–12)
Neutro Abs: 5.3 10*3/uL (ref 1.7–7.7)
Neutrophils Relative %: 61 % (ref 43–77)
Platelets: 322 10*3/uL (ref 150–400)
RBC: 4.82 MIL/uL (ref 3.87–5.11)
RDW: 14.6 % (ref 11.5–15.5)
WBC: 8.7 10*3/uL (ref 4.0–10.5)

## 2014-07-26 MED ORDER — CITALOPRAM HYDROBROMIDE 10 MG PO TABS
10.0000 mg | ORAL_TABLET | Freq: Every day | ORAL | Status: DC
Start: 2014-07-26 — End: 2014-10-25

## 2014-07-26 MED ORDER — ASPIRIN EC 81 MG PO TBEC: 81.0000 mg | DELAYED_RELEASE_TABLET | Freq: Every day | ORAL | Status: DC

## 2014-07-26 MED ORDER — LOSARTAN POTASSIUM 50 MG PO TABS: 100.0000 mg | ORAL_TABLET | Freq: Every day | ORAL | Status: DC

## 2014-07-26 MED ORDER — HYDROCHLOROTHIAZIDE 12.5 MG PO TABS: 12.5000 mg | ORAL_TABLET | Freq: Every day | ORAL | Status: DC

## 2014-07-26 MED ORDER — LEVOTHYROXINE SODIUM 100 MCG PO TABS: 100.0000 ug | ORAL_TABLET | Freq: Every day | ORAL | Status: DC

## 2014-07-26 MED ORDER — ATORVASTATIN CALCIUM 80 MG PO TABS: 80.0000 mg | ORAL_TABLET | Freq: Every day | ORAL | Status: DC

## 2014-07-26 NOTE — Assessment & Plan Note (Signed)
Denies any current deficits  - Continue ASA 81 mg qd and Statin

## 2014-07-26 NOTE — Assessment & Plan Note (Addendum)
BP currently well controlled, but reports cough since starting ACE-I last Feb after stroke - Switch Lisinopril to Cozaar: 100 mg qd - f/u in 1 month to check BP and reassess cough - Consider increasing HCTZ if BP elevated - Check CBC, CMET

## 2014-07-26 NOTE — Assessment & Plan Note (Signed)
Reports compliance with statin - Continue Lipitor  - Check Lipid panel

## 2014-07-26 NOTE — Progress Notes (Signed)
  Patient name: Morgan Collins MRN [id number]  Date of birth: [birth year]  CC & HPI:  Morgan Collins is a 59 y.o. female presenting today for HTN, Hypothyroidism, HLD.   CHRONIC HYPERTENSION  BP Readings from Last 3 Encounters:  07/26/14 121/81  05/30/14 110/70  03/08/14 98/71    Control: Good Disease Monitoring  Chest pain: no   Dyspnea: no   Claudication: no  Medication compliance: yes  Medication Side Effects: Yes = Cough. Denies Dizziness/lightheadedness; angioedema Preventitive Healthcare:   History  Smoking status  . Never Smoker   Smokeless tobacco  . Not on file   Hyperlipidemia  Last LDL?: 263 on 2/'15  Medication Compliance: yes  Side Effects?: no muscle pain or weakness, RUQ pain; jaundice  Hypothyroidism - Reports compliance with Synthroid - Denies temperature instability, shortness of breath, fatigue  ROS: See HPI   Medical & Surgical Hx:  Reviewed  Medications & Allergies: Reviewed  Social History: Reviewed:   Objective Findings:  Vitals: BP 121/81 mmHg  Pulse 86  Temp(Src) 97.9 F (36.6 C) (Oral)  Ht 5\' 3"  (1.6 m)  Wt 248 lb (112.492 kg)  BMI 43.94 kg/m2  Gen: NAD CV: RRR w/o m/r/g, pulses +2 b/l Resp: CTAB w/ normal respiratory effort Lower Ext: No skin changes; No edema; distal pulses intact; Calves nontender  Assessment & Plan:   Please See Problem Focused Assessment & Plan

## 2014-07-26 NOTE — Patient Instructions (Signed)
It was great seeing you today.   I have order some labs today. I will send you a letter with the results, or call you if we need to make any changes to your current therapies.  Stop taking Lisinopril Start taking Cozaar: 1 pill daily for 1 week, then 2 pills daily until your next appointment   Please bring all your medications to every doctors visit  Sign up for My Chart to have easy access to your labs results, and communication with your Primary care physician.  Next Appointment  Please make an appointment with Dr Berkley Harvey in 1 month   I look forward to talking with you again at our next visit. If you have any questions or concerns before then, please call the clinic at [phone].  Take Care,   Dr Phill Myron

## 2014-07-26 NOTE — Assessment & Plan Note (Signed)
Report improved mood with Celexa - Followed by Dr Naaman Plummer

## 2014-07-26 NOTE — Assessment & Plan Note (Signed)
Recent thyroid labs wnl - Continue synthroid

## 2014-07-27 LAB — COMPREHENSIVE METABOLIC PANEL
ALT: 25 U/L (ref 0–35)
AST: 16 U/L (ref 0–37)
Albumin: 4.1 g/dL (ref 3.5–5.2)
Alkaline Phosphatase: 74 U/L (ref 39–117)
BUN: 14 mg/dL (ref 6–23)
CO2: 28 mEq/L (ref 19–32)
Calcium: 11.1 mg/dL — ABNORMAL HIGH (ref 8.4–10.5)
Chloride: 102 mEq/L (ref 96–112)
Creat: 0.71 mg/dL (ref 0.50–1.10)
Glucose, Bld: 83 mg/dL (ref 70–99)
Potassium: 4.3 mEq/L (ref 3.5–5.3)
Sodium: 139 mEq/L (ref 135–145)
Total Bilirubin: 0.3 mg/dL (ref 0.2–1.2)
Total Protein: 7.6 g/dL (ref 6.0–8.3)

## 2014-07-27 LAB — LIPID PANEL
Cholesterol: 207 mg/dL — ABNORMAL HIGH (ref 0–200)
HDL: 58 mg/dL (ref >39)
LDL Cholesterol: 127 mg/dL — ABNORMAL HIGH (ref 0–99)
Total CHOL/HDL Ratio: 3.6 Ratio
Triglycerides: 109 mg/dL (ref <150)
VLDL: 22 mg/dL (ref 0–40)

## 2014-08-11 ENCOUNTER — Ambulatory Visit: Payer: Self-pay | Admitting: Family Medicine

## 2014-08-22 ENCOUNTER — Ambulatory Visit: Payer: BLUE CROSS/BLUE SHIELD | Admitting: Family Medicine

## 2014-08-24 ENCOUNTER — Ambulatory Visit: Payer: Self-pay | Admitting: Neurology

## 2014-08-25 ENCOUNTER — Encounter: Payer: Self-pay | Admitting: Neurology

## 2014-08-29 ENCOUNTER — Encounter
Payer: BLUE CROSS/BLUE SHIELD | Attending: Physical Medicine & Rehabilitation | Admitting: Physical Medicine & Rehabilitation

## 2014-08-29 DIAGNOSIS — F329 Major depressive disorder, single episode, unspecified: Secondary | ICD-10-CM | POA: Insufficient documentation

## 2014-08-29 DIAGNOSIS — G919 Hydrocephalus, unspecified: Secondary | ICD-10-CM | POA: Insufficient documentation

## 2014-08-29 DIAGNOSIS — I10 Essential (primary) hypertension: Secondary | ICD-10-CM | POA: Insufficient documentation

## 2014-08-29 DIAGNOSIS — I69198 Other sequelae of nontraumatic intracerebral hemorrhage: Secondary | ICD-10-CM | POA: Insufficient documentation

## 2014-08-29 DIAGNOSIS — R0602 Shortness of breath: Secondary | ICD-10-CM | POA: Insufficient documentation

## 2014-08-29 DIAGNOSIS — E039 Hypothyroidism, unspecified: Secondary | ICD-10-CM | POA: Insufficient documentation

## 2014-09-29 ENCOUNTER — Telehealth: Payer: Self-pay | Admitting: *Deleted

## 2014-09-29 NOTE — Telephone Encounter (Signed)
LMTCB will try again later. Amee Boothe, CMA. 

## 2014-09-29 NOTE — Telephone Encounter (Signed)
-----   Message from Olam Idler, MD sent at 09/29/2014  1:50 PM EDT ----- Please call and let her know I ordered some labs to evaluate her high Calcium levels. She should schedule an appointment with the lab to have this done and then schedule appointment with Me ~ 1-2 weeks later to discuss results and next steps. If she wants to discuss this first please have her schedule the visit with me and will do the lab work after that visit.

## 2014-10-03 NOTE — Telephone Encounter (Signed)
I looked in pt chart for future lab order and did not see it and had Jazmin double check with me and she did not see it as well. Can you place order in for labs before I contact patient? Jream Broyles, CMA.

## 2014-10-04 NOTE — Telephone Encounter (Signed)
Future order placed for PTH/Ca. Please call the patient and help her get a lab visit for this, thanks. -Dr. Lamar Benes

## 2014-10-05 NOTE — Telephone Encounter (Signed)
LM for patient to call back. Omeka Holben,CMA  

## 2014-10-06 ENCOUNTER — Encounter: Payer: Self-pay | Admitting: *Deleted

## 2014-10-06 NOTE — Telephone Encounter (Signed)
Tried to call patient but number listed is not correct.  Spoke with daughter and she also gave me the same number that no longer belongs to patient.  Will mail her a letter asking her to contact the office. Morgan Collins,CMA

## 2014-10-06 NOTE — Telephone Encounter (Signed)
LM for patient to call back and schedule a lab only appt. Montzerrat Brunell,CMA

## 2014-10-11 ENCOUNTER — Other Ambulatory Visit: Payer: BLUE CROSS/BLUE SHIELD

## 2014-10-11 NOTE — Progress Notes (Signed)
PTH DONE TODAY Morgan Collins

## 2014-10-12 LAB — PTH, INTACT AND CALCIUM
Calcium: 10 mg/dL (ref 8.4–10.5)
PTH: 123 pg/mL — ABNORMAL HIGH (ref 14–64)

## 2014-10-13 ENCOUNTER — Ambulatory Visit: Payer: BLUE CROSS/BLUE SHIELD | Admitting: Family Medicine

## 2014-10-24 ENCOUNTER — Ambulatory Visit: Payer: BLUE CROSS/BLUE SHIELD | Admitting: Family Medicine

## 2014-10-24 ENCOUNTER — Encounter: Payer: Self-pay | Admitting: Family Medicine

## 2014-10-24 ENCOUNTER — Ambulatory Visit (INDEPENDENT_AMBULATORY_CARE_PROVIDER_SITE_OTHER): Payer: BLUE CROSS/BLUE SHIELD | Admitting: Family Medicine

## 2014-10-24 VITALS — Ht 62.5 in | Wt 253.1 lb

## 2014-10-24 DIAGNOSIS — E785 Hyperlipidemia, unspecified: Secondary | ICD-10-CM | POA: Diagnosis not present

## 2014-10-24 DIAGNOSIS — I10 Essential (primary) hypertension: Secondary | ICD-10-CM | POA: Diagnosis not present

## 2014-10-24 NOTE — Patient Instructions (Addendum)
-   Make an IDEAL weekly schedule for yourself that includes:  - Physical activity  - Leisure time  - Sleep   - Time for meal planning and for eating - Make a list of current responsibilities; rate each according to whether or not it is energizing or depleting and if it's for YOU or for OTHERS.  Then prioritize those responsibilities, and determine which you can let go.    - See where your most important responsibilities can FIT into your ideal schedule.    - When you talk with anyone about cutting back or taking on more, include your doctor's recommendations.  - HONOR YOUR GUT FEELINGS.  If they say, "something's just not working," then that is most likely accurate.    - Goals:   1. Eat at least 3 meals and 1-2 snacks per day.  Aim for no more than 5 hours between eating.  Eat breakfast within one hour of getting up.   2. Bedtime no later than 11 PM.    3. At least one veg/fruit per day.

## 2014-10-24 NOTE — Progress Notes (Signed)
Medical Nutrition Therapy:  Appt start time: 1130 end time:  1230.  Assessment:  Primary concerns today: Weight management.   Mikaylee was aware that her weight has gone up.  She is currently stressed and overwhelmed.  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  This has been especially stressful as she has taken on more than she can handle; tends to get emotionally involved with some in the group home, and agrees to work more often than she wants.  Other recent stressors have included 3 family members/friends diagnosed with advanced cancer.  She is living with her daughter and her boyfriend with their 87-YO son.  The boyfriend gets drunk every weekend; Sole's son is back in jail, and Louise sometimes cares for his baby.  Baneza is also very active in her church.  Consequently, she feels she has no time for herself.  She will be going to [place] to visit family for a week in June, however.  Janazia has not followed up on getting her mammogram and colonoscopy, and feels bad that she has lost the referral papers for these.    24-hr recall:  (Up at 5 AM to feed grandbaby; back to sleep, and up at 8 AM) B ( AM)-  --- (missed medicine b/c overslept) Snk ( AM)-  --- L (3 PM)-  2 link sausage on small rolls, sausage patty on 1 bread, 4 oz Kool-Aid Snk (5 PM)-  16 oz Cheerwine D (5:30 PM)-  [employer] fried chx brst, 1 1/4 c mac&chs, 1 1/2 c dirty rice  Snk (7:30)-  12 oz Cookout Oreo cookies shake  Typical day? Yes.  except that intake and eating times are erratic.  There really is no "typical."  Progress Towards Goal(s):  In progress.   Nutritional Diagnosis:  [place]-3.3 Overweight/obesity As related to energy balance.  As evidenced by BMI >40.    Intervention:  Nutrition education.  Monitoring/Evaluation:  Dietary intake, exercise, and body weight in 4 week(s).

## 2014-10-25 ENCOUNTER — Other Ambulatory Visit: Payer: Self-pay

## 2014-10-25 ENCOUNTER — Ambulatory Visit (INDEPENDENT_AMBULATORY_CARE_PROVIDER_SITE_OTHER): Payer: BLUE CROSS/BLUE SHIELD | Admitting: Family Medicine

## 2014-10-25 ENCOUNTER — Other Ambulatory Visit: Payer: Self-pay | Admitting: Family Medicine

## 2014-10-25 ENCOUNTER — Encounter: Payer: Self-pay | Admitting: Family Medicine

## 2014-10-25 VITALS — BP 113/85 | HR 92 | Temp 98.1°F | Ht 63.0 in | Wt 253.3 lb

## 2014-10-25 DIAGNOSIS — E039 Hypothyroidism, unspecified: Secondary | ICD-10-CM

## 2014-10-25 DIAGNOSIS — Z1231 Encounter for screening mammogram for malignant neoplasm of breast: Secondary | ICD-10-CM

## 2014-10-25 DIAGNOSIS — E213 Hyperparathyroidism, unspecified: Secondary | ICD-10-CM

## 2014-10-25 DIAGNOSIS — R0602 Shortness of breath: Secondary | ICD-10-CM | POA: Diagnosis not present

## 2014-10-25 DIAGNOSIS — F329 Major depressive disorder, single episode, unspecified: Secondary | ICD-10-CM

## 2014-10-25 DIAGNOSIS — F341 Dysthymic disorder: Secondary | ICD-10-CM

## 2014-10-25 DIAGNOSIS — R0600 Dyspnea, unspecified: Secondary | ICD-10-CM

## 2014-10-25 DIAGNOSIS — F32A Depression, unspecified: Secondary | ICD-10-CM

## 2014-10-25 MED ORDER — CITALOPRAM HYDROBROMIDE 20 MG PO TABS: 20.0000 mg | ORAL_TABLET | Freq: Every day | ORAL | Status: DC

## 2014-10-25 NOTE — Assessment & Plan Note (Signed)
Calcium elevated for several months; PTH elevated on last check, however calcium had normalized at that time - Most likely primary hyperparathyroidism; however, will check vitamin D to rule out concomitant hypo-vitamin D versus secondary hyperparathyroidism - If vitamin D low-  Will treat and reassess - If vitamin D within normal limits.  We'll discuss symptomatic therapy for hypercalcemia/hyperparathyroidism for  Mood/fatigue symptoms and paresthesias - Dexa ordered - Consider renal ultrasound to assess for nephrolithiasis - Continue with hydrochlorothiazide at this time as BP well-controlled.  However, consider discontinuing if calcium increases or becomes overtly symptomatic

## 2014-10-25 NOTE — Progress Notes (Signed)
  Patient name: Morgan Collins MRN [id number]  Date of birth: 03/01/1956  CC & HPI:  Morgan Collins is a 60 y.o. female presenting today for stress/mood, hypercalcemia, and swelling in feet.   Stress - She reports a great deal of stress in the past several months.  She feels that she is always caring for friends and family members and has limited time to care for herself.  Currently living with daughter and boyfriend which limits her ability to relax; she is currently looking for a new place to live.  She also reports feeling down for the past several weeks.  She reports being started on Celexa 10 mg after her stroke, there is some improvement in her mood with this.  She also reports history of intubation and, approximate 6 months after the birth of her son - but is vague and says she cannot recall any details surrounding this.   Hypercalcemia - She denies any current symptoms other than fatigue and depressed mood.  However, when asked, she does endorse tingling and paresthesias in her upper extremities bilaterally.Specifically denies any abdominal pain, nausea or vomiting, constipation, bone pain, history of kidney stones or hematuria.  Denies any family history of hypercalcemia or hyperparathyroidism.  Denies a family history of pituitary or pancreatic problems.   LE swelling - She reports mild bilateral swelling of her lower extremities that improves with elevation.  Reports mild shortness of breath and fatigue over the past several months.  She denies missing any doses of Synthroid; takes his medication in the morning on empty stomach 30 minutes prior to taking additional medication.   ROS: See HPI   Medical & Surgical Hx:  Reviewed  Medications & Allergies: Reviewed  Social History: Reviewed:   Objective Findings:  Vitals: BP 113/85 mmHg  Pulse 92  Temp(Src) 98.1 F (36.7 C) (Oral)  Ht 5\' 3"  (1.6 m)  Wt 253 lb 5 oz (114.902 kg)  BMI 44.88 kg/m2  Gen: NAD Neck : Thyroid  nonpalpable CV: RRR w/o m/r/g, pulses +2 b/l Resp: CTAB w/ normal respiratory effort Lower Ext: No skin changes; trace edema; distal pulses intact; Calves nontender  Assessment & Plan:   Please See Problem Focused Assessment & Plan

## 2014-10-25 NOTE — Assessment & Plan Note (Signed)
Last TSH elevated. Recheck today. Discuss medication compliance and she report taking medication on empty stomach without other medications until > 29mins - If remains elevated, will increase Synthroid dose as could be contributing to her mood, lower extremity swelling, or paresthesias

## 2014-10-25 NOTE — Assessment & Plan Note (Signed)
Mild SOB, fatigue and trace LE swelling. No Echo on file. Denies CP - Possibly due to hypothyroidism versus depression versus hyperparathyroidism - No current signs or symptoms of acute heart failure - Check BNP, if normal, will treat as above and reassess.  If elevated, will obtain echo

## 2014-10-25 NOTE — Assessment & Plan Note (Signed)
Possibly related to hypothyroidism versus hyperparathyroidism - see problem based assessment for individual assessment, treatment.  However, suspect largely due to psychosocial stress: Living with daughter and boyfriend; son recently reincarcerated and she is helping care for his child.  - She also reports vague history of coma for approximately one month, requiring intubation about 6 months after the birth of her son.  She is unable to recall any specific events or medical diagnosis leading to this.  She goes on to elaborate that she was "out of it" until her son entered kindergarten , and doesn't recall the time between his birth and kindergarten.  She denies any previous suicidal ideations or attempts; however, this history is very concerning.  - Increase Celexa to 20 mg daily as she has noticed some improvement on this - Refer to psychology

## 2014-10-25 NOTE — Patient Instructions (Signed)
It was great seeing you today.   I have order some labs today to check your calcium. I will send you a letter with the results, or call you if we need to make any changes to your current therapies.    Please bring all your medications to every doctors visit  Sign up for My Chart to have easy access to your labs results, and communication with your Primary care physician.  Next Appointment  Please make an appointment with Dr Berkley Harvey 1 week after your Dexa scan or within 3-4 weeks if don't have scan by then.    I look forward to talking with you again at our next visit. If you have any questions or concerns before then, please call the clinic at [phone].  Take Care,   Dr Phill Myron

## 2014-10-26 ENCOUNTER — Other Ambulatory Visit: Payer: BLUE CROSS/BLUE SHIELD

## 2014-10-26 DIAGNOSIS — E039 Hypothyroidism, unspecified: Secondary | ICD-10-CM

## 2014-10-26 DIAGNOSIS — R0602 Shortness of breath: Secondary | ICD-10-CM

## 2014-10-26 LAB — COMPREHENSIVE METABOLIC PANEL
ALT: 58 U/L — ABNORMAL HIGH (ref 0–35)
AST: 25 U/L (ref 0–37)
Albumin: 3.7 g/dL (ref 3.5–5.2)
Alkaline Phosphatase: 71 U/L (ref 39–117)
BUN: 14 mg/dL (ref 6–23)
CO2: 25 mEq/L (ref 19–32)
Calcium: 10.8 mg/dL — ABNORMAL HIGH (ref 8.4–10.5)
Chloride: 104 mEq/L (ref 96–112)
Creat: 0.83 mg/dL (ref 0.50–1.10)
Glucose, Bld: 119 mg/dL — ABNORMAL HIGH (ref 70–99)
Potassium: 4.6 mEq/L (ref 3.5–5.3)
Sodium: 140 mEq/L (ref 135–145)
Total Bilirubin: 0.3 mg/dL (ref 0.2–1.2)
Total Protein: 7.2 g/dL (ref 6.0–8.3)

## 2014-10-26 LAB — TSH: TSH: 2.053 u[IU]/mL (ref 0.350–4.500)

## 2014-10-26 NOTE — Progress Notes (Signed)
[facility] phlebotomist drew:  CMP, TSH, VIT D, BNP, IONIZED CALCIUM

## 2014-10-27 LAB — BRAIN NATRIURETIC PEPTIDE: Brain Natriuretic Peptide: 11.3 pg/mL (ref 0.0–100.0)

## 2014-10-27 LAB — VITAMIN D 25 HYDROXY (VIT D DEFICIENCY, FRACTURES): Vit D, 25-Hydroxy: 26 ng/mL — ABNORMAL LOW (ref 30–100)

## 2014-10-27 LAB — CALCIUM, IONIZED: Calcium, Ion: 1.57 mmol/L — ABNORMAL HIGH (ref 1.12–1.32)

## 2014-11-16 ENCOUNTER — Ambulatory Visit
Admission: RE | Admit: 2014-11-16 | Discharge: 2014-11-16 | Disposition: A | Discharge status: Home / Self Care | Payer: BLUE CROSS/BLUE SHIELD | Source: Ambulatory Visit | Attending: Family Medicine | Admitting: Family Medicine

## 2014-11-16 ENCOUNTER — Other Ambulatory Visit: Payer: Self-pay | Admitting: Gastroenterology

## 2014-11-16 ENCOUNTER — Ambulatory Visit
Admission: RE | Admit: 2014-11-16 | Discharge: 2014-11-16 | Disposition: A | Discharge status: Home / Self Care | Payer: BLUE CROSS/BLUE SHIELD | Source: Ambulatory Visit

## 2014-11-16 DIAGNOSIS — E213 Hyperparathyroidism, unspecified: Secondary | ICD-10-CM

## 2014-11-16 DIAGNOSIS — Z1231 Encounter for screening mammogram for malignant neoplasm of breast: Secondary | ICD-10-CM

## 2014-11-22 ENCOUNTER — Telehealth: Payer: Self-pay | Admitting: *Deleted

## 2014-11-22 ENCOUNTER — Ambulatory Visit: Payer: BLUE CROSS/BLUE SHIELD | Admitting: Family Medicine

## 2014-11-22 NOTE — Telephone Encounter (Signed)
-----   Message from Olam Idler, MD sent at 11/22/2014  1:37 PM EDT ----- Rene Paci tried calling her several times but have been unable to get in touch with her. Please call her and have her schedule an appointment to discuss her calcium and bone density scan. This is nothing urgent, but I would like to see her at her earliest convenience. You can double book me if necessary. Please let me know if she is unable to schedule with me in the next few weeks.

## 2014-11-23 ENCOUNTER — Encounter: Payer: Self-pay | Admitting: *Deleted

## 2014-11-23 NOTE — Telephone Encounter (Signed)
Mailed letter to pt today to schedule FU appt. Paz Winsett, CMA.

## 2014-12-16 ENCOUNTER — Ambulatory Visit (INDEPENDENT_AMBULATORY_CARE_PROVIDER_SITE_OTHER): Payer: BLUE CROSS/BLUE SHIELD | Admitting: Internal Medicine

## 2014-12-16 ENCOUNTER — Ambulatory Visit: Payer: BLUE CROSS/BLUE SHIELD | Admitting: Internal Medicine

## 2014-12-16 ENCOUNTER — Encounter: Payer: Self-pay | Admitting: Internal Medicine

## 2014-12-16 VITALS — BP 141/83 | Temp 98.5°F | Ht 63.0 in | Wt 252.0 lb

## 2014-12-16 DIAGNOSIS — R059 Cough, unspecified: Secondary | ICD-10-CM

## 2014-12-16 DIAGNOSIS — R05 Cough: Secondary | ICD-10-CM

## 2014-12-16 MED ORDER — AZITHROMYCIN 500 MG PO TABS: ORAL_TABLET | ORAL | Status: DC

## 2014-12-16 NOTE — Patient Instructions (Addendum)
It was great meeting you! Thanks for coming into clinic today. I have prescribed you an antibiotic called Azithromycin (Zithromax). Please take 500mg  (2 tablets) for day 1, then take 250mg  (1 tablet) for 4 days. You may also take Mucinex to help with your cough. If you start feeling much worse, develop a fever, or start becoming very short of breath, please come back to see [place].  Dr. Brett Albino

## 2014-12-16 NOTE — Progress Notes (Addendum)
   Subjective:    Patient ID: Morgan Collins, female    DOB: 08-16-1955, 58 y.o.   MRN: [id number]  HPI Comments: COUGH  Has been coughing for one month. Cough is: constant throughout the day. Sputum production: White, thick mucus Worse with sitting, better with laying down Medications tried: Mucinex for two days, did not help. Taking blood pressure medications: HCTZ 12.5mg  qd, Cozaar 100mg  qd History of bronchitis every year in December. History of cough with ACEI, improved after switching to ARB. No smoking history.  Symptoms Runny nose: For the first couple of days, now resolved Mucous in back of throat: Yes Throat burning or reflux: No Wheezing or asthma: No Sneezing: Yes Fever: No Chest Pain: Chest soreness when she coughs Shortness of breath: No Leg swelling: Yes, is a chronic issue. No more swelling than usual. Hemoptysis: No Weight loss: No  Cough Pertinent negatives include no chills, eye redness, fever, headaches, rhinorrhea, shortness of breath or wheezing. There is no history of environmental allergies.      Review of Systems  Constitutional: Positive for fatigue. Negative for fever and chills.  HENT: Negative for congestion and rhinorrhea.   Eyes: Negative for discharge and redness.  Respiratory: Positive for cough. Negative for shortness of breath and wheezing.   Cardiovascular: Positive for leg swelling.  Allergic/Immunologic: Negative for environmental allergies.  Neurological: Negative for headaches.  Hematological: Negative for adenopathy.       Objective:   Physical Exam  Constitutional: She appears well-developed and well-nourished. No distress.  Eyes: Conjunctivae are normal. Pupils are equal, round, and reactive to light. Right eye exhibits no discharge. Left eye exhibits no discharge.  Neck: Neck supple.  Cardiovascular: Normal rate, regular rhythm and normal heart sounds.  Exam reveals no gallop and no friction rub.   No murmur  heard. Pulmonary/Chest: Effort normal and breath sounds normal. No respiratory distress. She has no wheezes.  Lymphadenopathy:    She has no cervical adenopathy.  Vitals reviewed.         Assessment & Plan:   COUGH -Cough has been going on for one month without improvement -Productive of white sputum -Lungs were clear bilaterally, so no indication for CXR at this time -Because cough has not improved over the course of the month, will treat for bronchitis with Azithromycin x 5 days and Mucinex as needed

## 2014-12-22 ENCOUNTER — Telehealth: Payer: Self-pay | Admitting: *Deleted

## 2014-12-22 NOTE — Telephone Encounter (Signed)
Received a call from [facility] stating they need to change the dosage for the Losartan. Route: Take 2 tablets (100 mg total) by mouth daily. Take 1 pill (50 mg) for 1 week. Then increase to 100 mg (2 pills) daily.  Pt's insurance will only cover 1 tablet once daily for 50 mg.  Rx can be changed to the 100 mg tablet without a prior authorization.  If continuing with same dosage, 50 mg a prior authorization is required.  Derl Barrow, RN

## 2014-12-26 ENCOUNTER — Encounter (HOSPITAL_COMMUNITY): Payer: Self-pay | Admitting: *Deleted

## 2014-12-26 ENCOUNTER — Ambulatory Visit (INDEPENDENT_AMBULATORY_CARE_PROVIDER_SITE_OTHER): Payer: BLUE CROSS/BLUE SHIELD | Admitting: Family Medicine

## 2014-12-26 ENCOUNTER — Ambulatory Visit: Payer: BLUE CROSS/BLUE SHIELD | Admitting: Family Medicine

## 2014-12-26 ENCOUNTER — Encounter: Payer: Self-pay | Admitting: Family Medicine

## 2014-12-26 VITALS — Ht 62.5 in | Wt 251.2 lb

## 2014-12-26 DIAGNOSIS — E785 Hyperlipidemia, unspecified: Secondary | ICD-10-CM

## 2014-12-26 DIAGNOSIS — I10 Essential (primary) hypertension: Secondary | ICD-10-CM

## 2014-12-26 NOTE — Patient Instructions (Addendum)
-   Your Citalopram (celexa) is an SSRI.  For it to work, you need to keep taking it consistently (DAILY).    - HOME EXERCISE:  WRITE DOWN exactly what your routine will be.   - Breakfast:  1-2 eggs with 1/2-1 cup grits; high-fiber cereal with milk; [place] sandwich; leftovers from dinner.  With any of these breakfasts, you could also include some fruit.   - Lunch and dinner:  Source of protein (meat, fish, poultry, eggs, dairy, beans, peanut butter), some starch (bread, rice, potatoes, corn, pasta, tortillas, English muffins, crackers, all baked goods), and some vegetables.    Goals: 1. Physical activity a total of 45 min 3 X wk, and at least 5 min of some kind of movement daily.   - Write down number of minutes of exercise on goals sheet each time!   2. Eat at least 3 meals and 1-2 snacks per day.  Aim for no more than 5 hours between eating.   3. Vegetables and/or fruit with both lunch and dinner.

## 2014-12-26 NOTE — Progress Notes (Signed)
Medical Nutrition Therapy:  Appt start time: 4818 end time:  1630.  Assessment:  Primary concerns today: Weight management.   Morgan Collins was in [place] for the month of June, visiting her cousin.  Since she has been home, she has not re-started work.  She has taken to heart the need to focus on her own needs. While in [place], Morgan Collins fell into a routine of late church services, late night eating, sleeping late, so no breakfast, and erratic meals.  She has started to get back on track; plans to join [employer] sometime this month.  She has a walking exercise CD and some 3-lb DBs, which she can use for home exercise.     24-hr recall:  (Up at 12 PM) B ( AM)-  --- Snk ( AM)-  --- L (1 PM)-  3 sausage links, flavored water Snk ( PM)-  --- D (8 PM)-  1 [place] wing, 1 c mac&chs, 1 c boiled cabbage,  Snk (9 PM)-  2 f-f ice crm bars, flavored water Typical day? No. No regular pattern.    Progress Towards Goal(s):  In progress.   Nutritional Diagnosis:  [place]-3.3 Overweight/obesity As related to energy balance.  As evidenced by BMI >40.    Intervention:  Nutrition education.  Monitoring/Evaluation:  Dietary intake, exercise, and body weight in 4 week(s).

## 2014-12-27 ENCOUNTER — Telehealth: Payer: Self-pay | Admitting: Family Medicine

## 2014-12-27 DIAGNOSIS — I10 Essential (primary) hypertension: Secondary | ICD-10-CM

## 2014-12-27 DIAGNOSIS — M810 Age-related osteoporosis without current pathological fracture: Secondary | ICD-10-CM

## 2014-12-27 NOTE — Telephone Encounter (Signed)
Prior Authorization received from [facility] for Losartan 50 mg. Pt's insurance will only cover 1 tablet per day.  Please change to 100 mg once daily or a PA will have to completed for the 50 mg 2 tablets once daily.  Derl Barrow, RN

## 2014-12-27 NOTE — Telephone Encounter (Signed)
Will forward to MD. Haniah Penny,CMA  

## 2014-12-27 NOTE — Telephone Encounter (Signed)
Pt called because they way the prescription is written for her Losartan the insurance will not pay for this. She is taking two 100mg  tablets a day. She needs the prescription to say that unless the doctor is changing her prescription. She has taken her last pill today and will not have anything for tomorrow. Please call and fix this, so she can get her medication. jw

## 2014-12-27 NOTE — Telephone Encounter (Signed)
Will forward to MD to change. Morgan Collins,CMA

## 2014-12-27 NOTE — Telephone Encounter (Signed)
Pt called again. She is completeley out of her BP. She is very concerned that she will get it in time to take tomorrow Please advsie

## 2014-12-28 ENCOUNTER — Telehealth: Payer: Self-pay | Admitting: Family Medicine

## 2014-12-28 DIAGNOSIS — M810 Age-related osteoporosis without current pathological fracture: Secondary | ICD-10-CM | POA: Insufficient documentation

## 2014-12-28 MED ORDER — LOSARTAN POTASSIUM 100 MG PO TABS: 100.0000 mg | ORAL_TABLET | Freq: Every day | ORAL | Status: DC

## 2014-12-28 NOTE — Telephone Encounter (Signed)
Called daughter and discussed her mother as hyperparathyroidism and osteoporosis as Ms. Saunders requested.

## 2014-12-28 NOTE — Telephone Encounter (Signed)
Will forward to PCP for review. Catina Nuss, CMA. 

## 2014-12-28 NOTE — Telephone Encounter (Signed)
Pt called because she just spoke to the doctor about her condition. She said that she heard what he said but really didn't understand what he said. She would like him to call her daughter at the number he used to talk to her and explain this to her daughter so that she can better understand what he is saying. jw

## 2014-12-28 NOTE — Telephone Encounter (Signed)
[place] and informed to make losartan 100 mg QD.  Patient can follow up with her PCP for all other medication needs.   Rosemarie Ax, MD PGY-3, [facility] 12/28/2014, 11:57 AM

## 2014-12-28 NOTE — Telephone Encounter (Signed)
Called and informed patient of her low Bone mineral density and it's likely association with her hypercalcemia and hyperparathyroidism. Referred her to endocrinology. She reports she would schedule an appointment with me next week.

## 2014-12-29 NOTE — H&P (Signed)
  Veleta Miners HPI: At this time the patient denies any problems with nausea, vomiting, fevers, chills, abdominal pain, diarrhea, constipation, hematochezia, melena, GERD, or dysphagia. The patient's sister had colon cancer. No complaints of chest pain, SOB, MI, or sleep apnea. She had a CVA 07/2013 without any residual deficits. She is s/p tracheostomy at the age of 70, but she denies any breathing issues. At that time she was very ill for a toxic ingestion that required ICU care.  Past Medical History  Diagnosis Date  . Hypertension   . Thyroid disease   . High cholesterol   . Depressed   . Hypothyroidism   . Stroke     1 yrs ago -slower ,but not a problem    Past Surgical History  Procedure Laterality Date  . Appendectomy    . C sections    . Abdominal hysterectomy    . Tracheostomy      past history of early 20's- closed now.    History reviewed. No pertinent family history.  Social History:  reports that she has never smoked. She does not have any smokeless tobacco history on file. She reports that she does not drink alcohol or use illicit drugs.  Allergies: No Known Allergies  Medications: Scheduled: Continuous:  No results found for this or any previous visit (from the past 24 hour(s)).   No results found.  ROS:  As stated above in the HPI otherwise negative.  There were no vitals taken for this visit.    PE: Gen: NAD, Alert and Oriented HEENT:  [place]/AT, EOMI Neck: Supple, no LAD Lungs: CTA Bilaterally CV: RRR without M/G/R ABM: Soft, NTND, +BS Ext: No C/C/E  Assessment/Plan: 1) Screening colonoscopy.  Sarath Privott D 12/29/2014, 12:42 PM

## 2014-12-30 ENCOUNTER — Encounter (HOSPITAL_COMMUNITY)
Admission: RE | Disposition: A | Discharge status: Home / Self Care | Payer: Self-pay | Source: Ambulatory Visit | Attending: Gastroenterology

## 2014-12-30 ENCOUNTER — Encounter (HOSPITAL_COMMUNITY): Payer: Self-pay | Admitting: Registered Nurse

## 2014-12-30 ENCOUNTER — Ambulatory Visit (HOSPITAL_COMMUNITY)
Admission: RE | Admit: 2014-12-30 | Discharge: 2014-12-30 | Disposition: A | Discharge status: Home / Self Care | Payer: BLUE CROSS/BLUE SHIELD | Source: Ambulatory Visit | Attending: Gastroenterology | Admitting: Gastroenterology

## 2014-12-30 ENCOUNTER — Ambulatory Visit (HOSPITAL_COMMUNITY): Payer: BLUE CROSS/BLUE SHIELD | Admitting: Registered Nurse

## 2014-12-30 DIAGNOSIS — Z79899 Other long term (current) drug therapy: Secondary | ICD-10-CM | POA: Diagnosis not present

## 2014-12-30 DIAGNOSIS — Z800 Family history of malignant neoplasm of digestive organs: Secondary | ICD-10-CM | POA: Insufficient documentation

## 2014-12-30 DIAGNOSIS — Z8673 Personal history of transient ischemic attack (TIA), and cerebral infarction without residual deficits: Secondary | ICD-10-CM | POA: Diagnosis not present

## 2014-12-30 DIAGNOSIS — Z1211 Encounter for screening for malignant neoplasm of colon: Secondary | ICD-10-CM | POA: Diagnosis present

## 2014-12-30 DIAGNOSIS — D125 Benign neoplasm of sigmoid colon: Secondary | ICD-10-CM | POA: Diagnosis not present

## 2014-12-30 DIAGNOSIS — I10 Essential (primary) hypertension: Secondary | ICD-10-CM | POA: Insufficient documentation

## 2014-12-30 DIAGNOSIS — D123 Benign neoplasm of transverse colon: Secondary | ICD-10-CM | POA: Insufficient documentation

## 2014-12-30 DIAGNOSIS — D120 Benign neoplasm of cecum: Secondary | ICD-10-CM | POA: Insufficient documentation

## 2014-12-30 DIAGNOSIS — Z6841 Body Mass Index (BMI) 40.0 and over, adult: Secondary | ICD-10-CM | POA: Insufficient documentation

## 2014-12-30 DIAGNOSIS — E039 Hypothyroidism, unspecified: Secondary | ICD-10-CM | POA: Insufficient documentation

## 2014-12-30 HISTORY — DX: Hypothyroidism, unspecified: E03.9

## 2014-12-30 HISTORY — PX: COLONOSCOPY WITH PROPOFOL: SHX5780

## 2014-12-30 HISTORY — DX: Cerebral infarction, unspecified: I63.9

## 2014-12-30 SURGERY — COLONOSCOPY WITH PROPOFOL
Anesthesia: Monitor Anesthesia Care

## 2014-12-30 MED ORDER — PROPOFOL INFUSION 10 MG/ML OPTIME
INTRAVENOUS | Status: DC | PRN
  Administered 2014-12-30: 200 ug/kg/min via INTRAVENOUS

## 2014-12-30 MED ORDER — PROPOFOL 10 MG/ML IV BOLUS
INTRAVENOUS | Status: AC
  Filled 2014-12-30: qty 20

## 2014-12-30 MED ORDER — LACTATED RINGERS IV SOLN
INTRAVENOUS | Status: DC
  Administered 2014-12-30: 1000 mL via INTRAVENOUS

## 2014-12-30 MED ORDER — LIDOCAINE HCL (CARDIAC) 20 MG/ML IV SOLN
INTRAVENOUS | Status: AC
  Filled 2014-12-30: qty 5

## 2014-12-30 MED ORDER — LIDOCAINE HCL (CARDIAC) 20 MG/ML IV SOLN
INTRAVENOUS | Status: DC | PRN
  Administered 2014-12-30: 100 mg via INTRAVENOUS

## 2014-12-30 MED ORDER — SODIUM CHLORIDE 0.9 % IV SOLN: INTRAVENOUS | Status: DC

## 2014-12-30 SURGICAL SUPPLY — 21 items

## 2014-12-30 NOTE — Anesthesia Preprocedure Evaluation (Addendum)
Anesthesia Evaluation  Patient identified by MRN, date of birth, ID band Patient awake    Reviewed: Allergy & Precautions, NPO status , Patient's Chart, lab work & pertinent test results  History of Anesthesia Complications Negative for: history of anesthetic complications  Airway Mallampati: II  TM Distance: >3 FB Neck ROM: Full    Dental  (+) Chipped, Missing, Poor Dentition, Dental Advisory Given   Pulmonary neg pulmonary ROS,  breath sounds clear to auscultation        Cardiovascular hypertension, Pt. on medications - anginaRhythm:Regular Rate:Normal     Neuro/Psych Depression CVA (h/o intracranial hemorrhage, hemiparesis now resolved )    GI/Hepatic negative GI ROS, Neg liver ROS,   Endo/Other  Hypothyroidism Morbid obesity  Renal/GU negative Renal ROS     Musculoskeletal   Abdominal (+) + obese,   Peds  Hematology   Anesthesia Other Findings   Reproductive/Obstetrics                           Anesthesia Physical Anesthesia Plan  ASA: III  Anesthesia Plan: MAC   Post-op Pain Management:    Induction: Intravenous  Airway Management Planned: Natural Airway and Simple Face Mask  Additional Equipment:   Intra-op Plan:   Post-operative Plan:   Informed Consent: I have reviewed the patients History and Physical, chart, labs and discussed the procedure including the risks, benefits and alternatives for the proposed anesthesia with the patient or authorized representative who has indicated his/her understanding and acceptance.   Dental advisory given  Plan Discussed with: CRNA and Surgeon  Anesthesia Plan Comments: (Plan routine monitors, MAC)        Anesthesia Quick Evaluation

## 2014-12-30 NOTE — Transfer of Care (Signed)
Immediate Anesthesia Transfer of Care Note  Patient: Morgan Collins  Procedure(s) Performed: Procedure(s): COLONOSCOPY WITH PROPOFOL (N/A)  Patient Location: PACU  Anesthesia Type:MAC  Level of Consciousness: awake, alert , oriented and patient cooperative  Airway & Oxygen Therapy: Patient Spontanous Breathing and Patient connected to face mask oxygen  Post-op Assessment: Report given to RN, Post -op Vital signs reviewed and stable and Patient moving all extremities X 4  Post vital signs: stable  Last Vitals:  Filed Vitals:   12/30/14 0907  BP: 130/91  Pulse: 76  Temp: 36.7 C  Resp: 17    Complications: No apparent anesthesia complications

## 2014-12-30 NOTE — Addendum Note (Signed)
Addendum  created 12/30/14 1518 by Lissa Morales, CRNA   Modules edited: Anesthesia Attestations

## 2014-12-30 NOTE — Op Note (Signed)
[facility] [place], [address]   COLONOSCOPY PROCEDURE REPORT  PATIENT: Morgan Collins, Morgan Collins  MR#: [id number] BIRTHDATE: 05-Jun-1956 , 89  yrs. old GENDER: female ENDOSCOPIST: Carol Ada, MD REFERRED BY: PROCEDURE DATE:  2015/01/27 PROCEDURE:   Colonoscopy with snare polypectomy ASA CLASS:   Class III INDICATIONS: Screening MEDICATIONS: Monitored anesthesia care  DESCRIPTION OF PROCEDURE:   After the risks and benefits and of the procedure were explained, informed consent was obtained.  revealed no abnormalities of the rectum.    The Pentax Ped Colon [id number] endoscope was introduced through the anus and advanced to the cecum, which was identified by both the appendix and ileocecal valve .  The quality of the prep was excellent. .  The instrument was then slowly withdrawn as the colon was fully examined. Estimated blood loss is zero unless otherwise noted in this procedure report.   FINDINGS: Three polyps measuring 3 mm were removed with a cold snare from the cecum, transverse colon , and sigmoid colon.  No evidence of any inflammation, ulcerations, erosions, masses, or vascular abnormalities.     Retroflexed views revealed no abnormalities. The scope was then withdrawn from the patient and the procedure completed.  WITHDRAWAL TIME: 14 minutes 0 seconds  COMPLICATIONS: There were no immediate complications. ENDOSCOPIC IMPRESSION: 1) Polyps.  RECOMMENDATIONS: 1) Await biopsy results. 2) Repeat the colonoscopy 3-5 years.  REPEAT EXAM:  cc:  _______________________________ eSignedCarol Ada, MD 01/27/2015 10:34 AM   CPT CODES: ICD CODES:  The ICD and CPT codes recommended by this software are interpretations from the data that the clinical staff has captured with the software.  The verification of the translation of this report to the ICD and CPT codes and modifiers is the sole responsibility of the health care institution and  practicing physician where this report was generated.  [employer] will not be held responsible for the validity of the ICD and CPT codes included on this report.  AMA assumes no liability for data contained or not contained herein. CPT is a Designer, television/film set of the [employer].   PATIENT NAME:  Morgan Collins, Morgan Collins MR#: [id number]

## 2014-12-30 NOTE — Addendum Note (Signed)
Addendum  created 12/30/14 1200 by Lissa Morales, CRNA   Modules edited: Anesthesia Medication Administration

## 2014-12-30 NOTE — Discharge Instructions (Signed)

## 2014-12-30 NOTE — Anesthesia Postprocedure Evaluation (Signed)
  Anesthesia Post-op Note  Patient: Morgan Collins  Procedure(s) Performed: Procedure(s): COLONOSCOPY WITH PROPOFOL (N/A)  Patient Location: Endoscopy Unit  Anesthesia Type:MAC  Level of Consciousness: awake, alert , oriented, patient cooperative and responds to stimulation  Airway and Oxygen Therapy: Patient Spontanous Breathing  Post-op Pain: none  Post-op Assessment: Post-op Vital signs reviewed, Patient's Cardiovascular Status Stable, Respiratory Function Stable, Patent Airway, No signs of Nausea or vomiting and Pain level controlled              Post-op Vital Signs: Reviewed and stable  Last Vitals:  Filed Vitals:   12/30/14 1037  BP: 99/51  Pulse: 73  Temp:   Resp: 16    Complications: No apparent anesthesia complications

## 2015-01-02 ENCOUNTER — Encounter (HOSPITAL_COMMUNITY): Payer: Self-pay | Admitting: Gastroenterology

## 2015-01-23 ENCOUNTER — Ambulatory Visit: Payer: BLUE CROSS/BLUE SHIELD | Admitting: Family Medicine

## 2015-02-09 ENCOUNTER — Encounter: Payer: Self-pay | Admitting: Endocrinology

## 2015-02-09 ENCOUNTER — Ambulatory Visit (INDEPENDENT_AMBULATORY_CARE_PROVIDER_SITE_OTHER): Payer: BLUE CROSS/BLUE SHIELD | Admitting: Endocrinology

## 2015-02-09 VITALS — BP 112/76 | HR 84 | Temp 97.8°F | Resp 16 | Ht 61.75 in | Wt 249.0 lb

## 2015-02-09 DIAGNOSIS — E210 Primary hyperparathyroidism: Secondary | ICD-10-CM

## 2015-02-09 DIAGNOSIS — E559 Vitamin D deficiency, unspecified: Secondary | ICD-10-CM

## 2015-02-09 DIAGNOSIS — M810 Age-related osteoporosis without current pathological fracture: Secondary | ICD-10-CM

## 2015-02-09 DIAGNOSIS — N958 Other specified menopausal and perimenopausal disorders: Secondary | ICD-10-CM

## 2015-02-09 DIAGNOSIS — E8940 Asymptomatic postprocedural ovarian failure: Secondary | ICD-10-CM | POA: Insufficient documentation

## 2015-02-09 MED ORDER — RISEDRONATE SODIUM 150 MG PO TABS: 150.0000 mg | ORAL_TABLET | ORAL | Status: DC

## 2015-02-09 NOTE — Progress Notes (Signed)
Patient ID: Morgan Collins, female   DOB: 1956/04/29, 59 y.o.   MRN: [id number]           Chief complaint: High calcium and osteoporosis  History of Present Illness:  Referring physician:   Review of records show that she has had a high calcium since about 2014, she had upper normal level in 2013 and previous records are not available.  Lab Results  Component Value Date   CALCIUM 10.8* 10/26/2014   CALCIUM 10.0 10/11/2014   CALCIUM 11.1* 07/26/2014   CALCIUM 11.6* 08/30/2013   CALCIUM 11.4* 08/18/2013   CALCIUM 11.2* 08/07/2013   CALCIUM 11.4* 08/05/2013   CALCIUM 11.0* 07/31/2013   CALCIUM 10.5 07/27/2013    The hypercalcemia is not associated with any pathologic fractures, renal insufficiency, history of kidney stones, sarcoidosis, known carcinoma or recent hyperthyroidism.   Prior serologic and radiologic studies have included:  Lab Results  Component Value Date   PTH 123* 10/11/2014   CALCIUM 10.8* 10/26/2014   CAION 1.57* 10/26/2014      25 (OH) Vitamin D level is 26  Bone mineral density done in 11/2014 showed T score of -3.5 at the spine but only -1.4 at the femur  She has not been on any treatment and she is now being referred for further management She has not been advised any vitamin D supplements, currently not taking any calcium supplements  OSTEOPOROSIS:  She had a bone density checked because of her history of hyperparathyroidism as above She apparently had oophorectomy in her 61s and her second ovary was removed at about the age of 53 because of a large cyst She has never been given hormone supplements She does not complain of hot flashes and has not done this previously also  She does not think she has had any height loss and does not complain of any episodes of low back pain, she does have some back discomfort if she is walking for too long     Medication List       This list is accurate as of: 02/09/15  1:29 PM.  Always use your most recent  med list.               acetaminophen 325 MG tablet  Commonly known as:  TYLENOL  Take 650 mg by mouth every 4 (four) hours as needed for moderate pain or headache.     aspirin EC 81 MG tablet  Take 1 tablet (81 mg total) by mouth daily.     atorvastatin 80 MG tablet  Commonly known as:  LIPITOR  Take 1 tablet (80 mg total) by mouth daily.     citalopram 20 MG tablet  Commonly known as:  CELEXA  Take 1 tablet (20 mg total) by mouth at bedtime.     hydrochlorothiazide 12.5 MG tablet  Commonly known as:  HYDRODIURIL  Take 1 tablet (12.5 mg total) by mouth daily.     levothyroxine 100 MCG tablet  Commonly known as:  SYNTHROID, LEVOTHROID  Take 1 tablet (100 mcg total) by mouth daily.     losartan 100 MG tablet  Commonly known as:  COZAAR  Take 1 tablet (100 mg total) by mouth daily. Take 1 pill (50mg ) for 1 week. Then increase to 100mg  (2 pills) daily     risedronate 150 MG tablet  Commonly known as:  ACTONEL  Take 1 tablet (150 mg total) by mouth every 30 (thirty) days. with water on empty stomach, nothing by mouth  or lie down for next 30 minutes.        Allergies: No Known Allergies  Past Medical History  Diagnosis Date  . Hypertension   . Thyroid disease   . High cholesterol   . Depressed   . Hypothyroidism   . Stroke     1 yrs ago -slower ,but not a problem    Past Surgical History  Procedure Laterality Date  . Appendectomy    . C sections    . Abdominal hysterectomy    . Tracheostomy      past history of early 20's- closed now.  . Colonoscopy with propofol N/A 12/30/2014    Procedure: COLONOSCOPY WITH PROPOFOL;  Surgeon: Carol Ada, MD;  Location: [facility] END[facility];  Service: Endoscopy;  Laterality: N/A;    No family history on file.  Social History:  reports that she has never smoked. She does not have any smokeless tobacco history on file. She reports that she does not drink alcohol or use illicit drugs.  Review of Systems  Constitutional: Positive  for malaise/fatigue. Negative for weight loss.       She has mild fatigue especially on increase activity  Eyes: Negative for blurred vision.  Respiratory: Positive for shortness of breath. Negative for cough.        She will get short of breath on moderate activity  Cardiovascular: Negative for palpitations.  Gastrointestinal: Negative for abdominal pain, diarrhea and constipation.  Genitourinary: Negative for frequency.       Nocturia  Musculoskeletal: Negative for back pain and joint pain.  Skin: Negative for rash.  Neurological: Negative for tingling and headaches.       No numbness in hands or feet   Endo/Heme/Allergies: Negative for polydipsia.        No history of excessive urination  Psychiatric/Behavioral: Negative for depression.   Hypothyroid since ? 2006 , appears to have received radioactive iodine treatment for hyperthyroidism at that time She does not complain of any unusual fatigue and her thyroid level is recently normal  Lab Results  Component Value Date   TSH 2.053 10/26/2014   TSH 18.807* 08/18/2013   FREET4 1.17 03/03/2014     EXAM:  BP 112/76 mmHg  Pulse 84  Temp(Src) 97.8 F (36.6 C)  Resp 16  Ht 5' 1.75" (1.568 m)  Wt 249 lb (112.946 kg)  BMI 45.94 kg/m2  SpO2 97%  GENERAL: She has marked generalized obesity  No pallor, clubbing, lymphadenopathy or edema.   Skin:  no rash or pigmentation.  EYES:  Externally normal.  Fundii:  normal discs and vessels.  ENT: Oral mucosa and tongue normal.  THYROID:  Not palpable.  HEART:  Normal  S1 and S2; no murmur or click.  CHEST:  Normal shape Lungs:   Vescicular breath sounds heard equally.  No crepitations/ wheeze.  ABDOMEN:  No distention.  Liver and spleen not palpable.  No other mass or tenderness.  NEUROLOGICAL: .Reflexes are normal bilaterally at biceps.  SPINE AND JOINTS:  Normal, no spinal deformity or tenderness.   Assessment/Plan:   HYPERCALCEMIA: This appears to be related to  hyperparathyroidism with mild persistent hypercalcemia and high PTH level  Her hyperparathyroidism is asymptomatic but associated with osteoporosis of her spine  However most likely her osteoporosis is related to premature menopause at the age of 52 and no subsequent hormonal replacement therapy; it may be somewhat worsened by her hyperparathyroidism state  Currently the patient is reluctant to consider parathyroid surgery Explained that taking  medications like bisphosphonates should be effective in improving her bone density although not affecting her parathyroid function. She is very reluctant to consider any parenteral forms of therapy and will want to take only oral treatment  She will start on Actonel 150 mg monthly, discussed how this is to be taken Since her vitamin D level is low we'll start her on OTC vitamin D3, 2000 units daily.  Will need follow-up levels in about 3 months May also consider HRT with an estrogen patch later augment the effects of bisphosphonates  Will also need to get a baseline renal function panel today She will follow-up in 3 months Will plan on doing another bone density in a year   Frenchie Pribyl 02/09/2015, 1:29 PM

## 2015-02-09 NOTE — Patient Instructions (Signed)
Vitamin D3, 2000 units daily 

## 2015-02-10 LAB — RENAL FUNCTION PANEL
Albumin: 3.9 g/dL (ref 3.5–5.2)
BUN: 17 mg/dL (ref 6–23)
CO2: 26 mEq/L (ref 19–32)
Calcium: 11 mg/dL — ABNORMAL HIGH (ref 8.4–10.5)
Chloride: 102 mEq/L (ref 96–112)
Creatinine, Ser: 0.89 mg/dL (ref 0.40–1.20)
GFR: 83.36 mL/min (ref >60.00)
Glucose, Bld: 176 mg/dL — ABNORMAL HIGH (ref 70–99)
Phosphorus: 2.3 mg/dL (ref 2.3–4.6)
Potassium: 3.9 mEq/L (ref 3.5–5.1)
Sodium: 138 mEq/L (ref 135–145)

## 2015-02-11 NOTE — Progress Notes (Signed)
Quick Note:  Please let patient know that the calcium is about the same as before but her glucose is 176, probably has mild diabetes and will need to check further on the next visit Meanwhile try to lose weight and eliminate extra carbohydrates and sugar  ______

## 2015-03-23 ENCOUNTER — Ambulatory Visit (INDEPENDENT_AMBULATORY_CARE_PROVIDER_SITE_OTHER): Payer: BLUE CROSS/BLUE SHIELD | Admitting: Family Medicine

## 2015-03-23 ENCOUNTER — Encounter: Payer: Self-pay | Admitting: Family Medicine

## 2015-03-23 VITALS — BP 113/72 | HR 85 | Temp 97.7°F | Ht 62.0 in | Wt 242.0 lb

## 2015-03-23 DIAGNOSIS — E785 Hyperlipidemia, unspecified: Secondary | ICD-10-CM | POA: Diagnosis not present

## 2015-03-23 DIAGNOSIS — R7303 Prediabetes: Secondary | ICD-10-CM | POA: Diagnosis not present

## 2015-03-23 DIAGNOSIS — E119 Type 2 diabetes mellitus without complications: Secondary | ICD-10-CM | POA: Insufficient documentation

## 2015-03-23 DIAGNOSIS — Z23 Encounter for immunization: Secondary | ICD-10-CM

## 2015-03-23 DIAGNOSIS — E210 Primary hyperparathyroidism: Secondary | ICD-10-CM | POA: Diagnosis not present

## 2015-03-23 DIAGNOSIS — F32A Depression, unspecified: Secondary | ICD-10-CM

## 2015-03-23 DIAGNOSIS — F329 Major depressive disorder, single episode, unspecified: Secondary | ICD-10-CM

## 2015-03-23 LAB — POCT GLYCOSYLATED HEMOGLOBIN (HGB A1C): Hemoglobin A1C: 7.6

## 2015-03-23 LAB — COMPREHENSIVE METABOLIC PANEL
ALT: 22 U/L (ref 6–29)
AST: 16 U/L (ref 10–35)
Albumin: 3.9 g/dL (ref 3.6–5.1)
Alkaline Phosphatase: 60 U/L (ref 33–130)
BUN: 16 mg/dL (ref 7–25)
CO2: 26 mmol/L (ref 20–31)
Calcium: 10.7 mg/dL — ABNORMAL HIGH (ref 8.6–10.4)
Chloride: 105 mmol/L (ref 98–110)
Creat: 1.2 mg/dL — ABNORMAL HIGH (ref 0.50–1.05)
Glucose, Bld: 102 mg/dL — ABNORMAL HIGH (ref 65–99)
Potassium: 3.8 mmol/L (ref 3.5–5.3)
Sodium: 138 mmol/L (ref 135–146)
Total Bilirubin: 0.4 mg/dL (ref 0.2–1.2)
Total Protein: 7.4 g/dL (ref 6.1–8.1)

## 2015-03-23 NOTE — Patient Instructions (Signed)
It was great seeing you today.   I have order some labs today to check your fatigue, headache. I will call you to discuss the results.  Keep your appointment with Dr. Jenne Campus and discuss diabetes nutrition   Please bring all your medications to every doctors visit  Sign up for My Chart to have easy access to your labs results, and communication with your Primary care physician.  Next Appointment  Please call to make an appointment with Dr Berkley Harvey in 3 months   I look forward to talking with you again at our next visit. If you have any questions or concerns before then, please call the clinic at [phone].  Take Care,   Dr Phill Myron

## 2015-03-23 NOTE — Assessment & Plan Note (Signed)
Previous A1c 6.3 over a year ago.  Could explain symptoms of fatigue, headache and dizziness - Recheck hemoglobin A1c today; Will call with results of A1c and next step - She is currently scheduled to meet with nutritionist next Tuesday that she has been following with since her stroke; advise discussing diabetic diet

## 2015-03-23 NOTE — Assessment & Plan Note (Signed)
Fatigue, sleep disturbances, possibly related to depression.  She reports compliance with Celexa.  PHQ 9 is 15 today - Continue Celexa 20 mg daily; consider increasing if symptoms not explained by [facility]

## 2015-03-23 NOTE — Assessment & Plan Note (Signed)
Followed by endocrinology.  - Taking vitamin D every other day - Check BMP

## 2015-03-23 NOTE — Progress Notes (Signed)
  Patient name: Morgan Collins MRN [id number]  Date of birth: July 02, 1955  CC & HPI:  Morgan Collins is a 59 y.o. female presenting today for fatigue, headache  Fatigue, Headache - She reports having mild bilateral temporal HA x 2 days and loose stools x 2 days.  - Denies fevers, chills, nasal congestion, runny nose, sore throat, cough, chest pain, shortness of breath - Told by her endocrinologist see had prediabetes - Has been taking vitamin D every other day - Has follow-up with endocrinologist 3 months  Depression - Reports compliance with Celexa 20 mg daily - Reports napping after breakfast for several hours most days; then get more energy at night and has trouble sleeping - He mind is telling her to get up, but her body is too tired - Some decreased mood, but enjoy being with her family and grandkids  ROS: See HPI   Medical & Surgical Hx:  Reviewed  Medications & Allergies: Reviewed  Social History: Reviewed:   Objective Findings:  Vitals: BP 113/72 mmHg  Pulse 85  Temp(Src) 97.7 F (36.5 C) (Oral)  Ht 5\' 2"  (1.575 m)  Wt 242 lb (109.77 kg)  BMI 44.25 kg/m2  Gen: NAD CV: RRR w/o m/r/g, pulses +2 b/l Resp: CTAB w/ normal respiratory effort Psych: PHQ 9 = 15   Assessment & Plan:   Hyperparathyroidism, primary Followed by endocrinology.  - Taking vitamin D every other day - Check BMP  Prediabetes Previous A1c 6.3 over a year ago.  Could explain symptoms of fatigue, headache and dizziness - Recheck hemoglobin A1c today; Will call with results of A1c and next step - She is currently scheduled to meet with nutritionist next Tuesday that she has been following with since her stroke; advise discussing diabetic diet  Depression Fatigue, sleep disturbances, possibly related to depression.  She reports compliance with Celexa.  PHQ 9 is 15 today - Continue Celexa 20 mg daily; consider increasing if symptoms not explained by [employer]

## 2015-03-24 ENCOUNTER — Telehealth: Payer: Self-pay | Admitting: Family Medicine

## 2015-03-24 NOTE — Telephone Encounter (Signed)
Called and confirmed that her A1c indicates she has diabetes. This could be causing her symptoms: fatigue, sleep difficulties, headache. She is meeting with Dr Jenne Campus on Tuesday for DM education. She will schedule appointment with PCP in 2-3 weeks to discuss meter and possible medications

## 2015-03-28 ENCOUNTER — Encounter: Payer: Self-pay | Admitting: Family Medicine

## 2015-03-28 ENCOUNTER — Ambulatory Visit (INDEPENDENT_AMBULATORY_CARE_PROVIDER_SITE_OTHER): Payer: BLUE CROSS/BLUE SHIELD | Admitting: Family Medicine

## 2015-03-28 VITALS — Ht 62.5 in | Wt 247.2 lb

## 2015-03-28 DIAGNOSIS — R7303 Prediabetes: Secondary | ICD-10-CM

## 2015-03-28 DIAGNOSIS — I10 Essential (primary) hypertension: Secondary | ICD-10-CM | POA: Diagnosis not present

## 2015-03-28 DIAGNOSIS — E785 Hyperlipidemia, unspecified: Secondary | ICD-10-CM

## 2015-03-28 NOTE — Progress Notes (Signed)
Medical Nutrition Therapy:  Appt start time: 1130 end time:  1230.  Assessment:  Primary concerns today: Weight management and HTN, HLD, and BG control.   Morgan Collins said she feels she is learning to say no more often, although she still feels very stressed.  She seems to have great difficulty with setting and sticking with any kind of consistent eating routine.  In addition, Morgan Collins's sleep pattern is upside-down, as she stays up much of the night, then is too tired to stay awake in the daytime.    24-hr recall:  (Up at 8 AM) B ( AM)-  --- Snk (11 AM)-  3/4 [employer] sausage-cheese McMuffin, water L (1:30 PM)-  [employer]: large salad w/ egg,.chs, croutons, chx, 3 T drsng, 2 fried chx wings, 1 c mac&chs, 1/2 c cabbage, 1/2 c breaded shrimp, diet lemonade Snk (6 PM)-  2 c grapes D (8 PM)-  3 fried chx wings, 1 1/2 c apple sauce, 12 oz juice Snk (10 PM)-  2 Reeses PB Cup  Typical day? No.  Usually has been eating 2 eggs & 2 [place] bacon for bkfst; sometimes skipping lunches.     Progress Towards Goal(s):  In progress.   Nutritional Diagnosis:  [place]-3.3 Overweight/obesity As related to energy balance.  As evidenced by BMI >40.    Intervention:  Nutrition education.  Monitoring/Evaluation:  Dietary intake, exercise, and body weight in 4 week(s).

## 2015-03-28 NOTE — Patient Instructions (Addendum)
-   When you get your blood sugar (glucose) checked, this measures how much sugar is in your blood at that moment.  When you get your Hemoglobin A-1-C (often called just "A1C") checked, this tells [place] how high your blood sugar has been over the past 3 months or so.    Diet Recommendations for Diabetes  Carbohydrates include STARCH, SUGAR, and FIBER.  Starch and sugar are the two types of carb that raise blood sugar and promote weight gain if consumed in large amounts.  Fiber helps to prevent blood sugar from going too high, so is helpful for blood sugar control and weight control.    Starchy (carb) foods: Bread, rice, pasta, potatoes, corn, cereal, grits, crackers, bagels, muffins, all baked goods.  (Fruits, milk, and yogurt also have carbohydrate, but most of these foods will not spike your blood sugar as the starchy foods will.)  A few fruits do cause high blood sugars; use small portions of bananas (limit to 1/2 at a time), grapes, watermelon, oranges, and most tropical fruits.     Protein foods: Meat, fish, poultry, eggs, dairy foods, and beans such as pinto and kidney beans (beans also provide carbohydrate).   1. Eat at least 3 meals and 1-2 snacks per day. Never go more than 5 hours while awake without eating. Eat breakfast within the first hour of getting up.    - A REAL meal includes a source of protein, starch, and vegetable and/or fruit.   2. Limit starchy foods to TWO per meal and ONE per snack. ONE portion of a starchy  food is equal to the following:   - ONE slice of bread (or its equivalent, such as half of a hamburger bun).   - 1/2 cup of a "scoopable" starchy food such as potatoes or rice.   - 15 grams of carbohydrate as shown on food label.  3. Include at every meal: a protein food, a carb food, and vegetables and/or fruit.   - Obtain twice the volume of veg's as protein or carbohydrate foods for both lunch and dinner.   - Fresh or frozen veg's are best.   - Keep frozen veg's on hand  for a quick vegetable serving.    4. Do your best to sleep only at night, not during the day, and get to bed no later than 11 PM! 5. Make sure you take your medicine consistently!!

## 2015-04-12 ENCOUNTER — Other Ambulatory Visit: Payer: Self-pay | Admitting: Family Medicine

## 2015-04-12 NOTE — Telephone Encounter (Signed)
Refill request from pharmacy. Will forward to PCP for review. Bethany Cumming, CMA. 

## 2015-04-13 ENCOUNTER — Telehealth: Payer: Self-pay | Admitting: Family Medicine

## 2015-04-21 ENCOUNTER — Ambulatory Visit (INDEPENDENT_AMBULATORY_CARE_PROVIDER_SITE_OTHER): Payer: BLUE CROSS/BLUE SHIELD | Admitting: Family Medicine

## 2015-04-21 ENCOUNTER — Encounter: Payer: Self-pay | Admitting: Family Medicine

## 2015-04-21 VITALS — BP 130/60 | HR 120 | Temp 98.6°F | Ht 62.5 in | Wt 241.0 lb

## 2015-04-21 DIAGNOSIS — IMO0001 Reserved for inherently not codable concepts without codable children: Secondary | ICD-10-CM

## 2015-04-21 DIAGNOSIS — R748 Abnormal levels of other serum enzymes: Secondary | ICD-10-CM | POA: Diagnosis not present

## 2015-04-21 DIAGNOSIS — R7989 Other specified abnormal findings of blood chemistry: Secondary | ICD-10-CM | POA: Insufficient documentation

## 2015-04-21 DIAGNOSIS — E039 Hypothyroidism, unspecified: Secondary | ICD-10-CM | POA: Diagnosis not present

## 2015-04-21 DIAGNOSIS — E1165 Type 2 diabetes mellitus with hyperglycemia: Secondary | ICD-10-CM | POA: Diagnosis not present

## 2015-04-21 DIAGNOSIS — R7303 Prediabetes: Secondary | ICD-10-CM | POA: Diagnosis not present

## 2015-04-21 LAB — COMPREHENSIVE METABOLIC PANEL
ALT: 20 U/L (ref 6–29)
AST: 12 U/L (ref 10–35)
Albumin: 4.1 g/dL (ref 3.6–5.1)
Alkaline Phosphatase: 71 U/L (ref 33–130)
BUN: 16 mg/dL (ref 7–25)
CO2: 27 mmol/L (ref 20–31)
Calcium: 11.5 mg/dL — ABNORMAL HIGH (ref 8.6–10.4)
Chloride: 103 mmol/L (ref 98–110)
Creat: 1.08 mg/dL — ABNORMAL HIGH (ref 0.50–1.05)
Glucose, Bld: 95 mg/dL (ref 65–99)
Potassium: 3.9 mmol/L (ref 3.5–5.3)
Sodium: 140 mmol/L (ref 135–146)
Total Bilirubin: 0.4 mg/dL (ref 0.2–1.2)
Total Protein: 7.8 g/dL (ref 6.1–8.1)

## 2015-04-21 LAB — TSH: TSH: 1.015 u[IU]/mL (ref 0.350–4.500)

## 2015-04-21 MED ORDER — METFORMIN HCL 500 MG PO TABS: 500.0000 mg | ORAL_TABLET | Freq: Two times a day (BID) | ORAL | Status: DC

## 2015-04-21 NOTE — Assessment & Plan Note (Addendum)
-   Continue working with nutritionist - Start metformin 500 mg daily, titrate up to 1000 mg twice a day.  Consider switching to XR  - follow up in 2 months for A1c check  - Discussed prescription for meter today.  She deferred meter until next A1c check - Cr elevated at last check; recheck BMET today

## 2015-04-21 NOTE — Assessment & Plan Note (Signed)
Check TSH - Continue current synthroid dose - follow up in 3 months

## 2015-04-21 NOTE — Patient Instructions (Signed)
It was great seeing you today.   I have order some labs today to check thyroid and kidney function. I will send you a letter with the results, or call you if we need to make any changes to your current therapies.  Start metformin 500 mg once a day.  Increase to 500 mg twice a day after the first week.  Increase to 1000 mg (2 pills) twice a day at the end of the second week.  Call me if you develop any nausea, vomiting or diarrhea   Please bring all your medications to every doctors visit  Sign up for My Chart to have easy access to your labs results, and communication with your Primary care physician.  Next Appointment  Please call to make an appointment with Dr Berkley Harvey after June 23 2015   I look forward to talking with you again at our next visit. If you have any questions or concerns before then, please call the clinic at [phone].  Take Care,   Dr Phill Myron

## 2015-04-21 NOTE — Progress Notes (Signed)
  Patient name: LENDA BARATTA MRN [id number]  Date of birth: 1956-02-19  CC & HPI:  Morgan Collins is a 59 y.o. female presenting today for hypothyroidism and diabetes.   DIABETES   Blood Sugar Ranges: Not checking doesn't have meter  Symptoms of Hypoglycemia? no  Comorbid Symptoms: no Chest pain; no SOB; no Neuropathy: no Vision problems  Counseling  Diet pattern: Working with Nutritionist and eating healthier   Exercise: walking  Hypothyroidism  - Denies chest pain, shortness breath, palpitations - Reports compliance with Synthroid - Denies hot or cold intolerance  ROS: See HPI   Medical & Surgical Hx:  Reviewed  Medications & Allergies: Reviewed  Social History: Reviewed:   Objective Findings:  Vitals: BP 130/60 mmHg  Pulse 120  Temp(Src) 98.6 F (37 C) (Oral)  Ht 5' 2.5" (1.588 m)  Wt 241 lb (109.317 kg)  BMI 43.35 kg/m2  SpO2 96%  Gen: NAD Neck: Thyroid nonpalpable CV: RRR w/o m/r/g, pulses +2 b/l Resp: CTAB w/ normal respiratory effort   Assessment & Plan:   Hypothyroidism Check TSH - Continue current synthroid dose - follow up in 3 months  Diabetes type 2, uncontrolled ([place]) - Continue working with nutritionist - Start metformin 500 mg daily, titrate up to 1000 mg twice a day.  Consider switching to XR  - follow up in 2 months for A1c check  - Discussed prescription for meter today.  She deferred meter until next A1c check - Cr elevated at last check; recheck BMET today

## 2015-04-24 ENCOUNTER — Other Ambulatory Visit: Payer: Self-pay | Admitting: Family Medicine

## 2015-05-01 ENCOUNTER — Ambulatory Visit: Payer: BLUE CROSS/BLUE SHIELD | Admitting: Family Medicine

## 2015-05-16 ENCOUNTER — Other Ambulatory Visit: Payer: Self-pay | Admitting: Family Medicine

## 2015-05-17 ENCOUNTER — Telehealth: Payer: Self-pay | Admitting: Family Medicine

## 2015-05-17 ENCOUNTER — Ambulatory Visit: Payer: BLUE CROSS/BLUE SHIELD | Admitting: Endocrinology

## 2015-05-17 NOTE — Telephone Encounter (Signed)
Spoke with patient and she states that she has come down with bronchitis again.  Would like to know if she can get a refill on her medication from her visit in July with Dr. Brett Albino or will she need to be evaluated.  I informed patient that I would ask about it but that she may need an appt before this can be done.  She voiced understanding and will wait to hear back from [place].  Morgan Collins,CMA

## 2015-05-17 NOTE — Telephone Encounter (Signed)
Advised pt as directed below and verbalized understanding and she stated that she would like to come in for this. Scheduled pt on SDA with Dr. Jerline Pain on 05/18/15 as she was ok to see any doctor in clinic. Rande Roylance, CMA.

## 2015-05-17 NOTE — Telephone Encounter (Signed)
Would like to talk to dr Berkley Harvey about some medicine

## 2015-05-17 NOTE — Telephone Encounter (Signed)
She was given antibiotics last time because her symptoms lasted for a month; however, most bronchitis is viral and does not need antibiotics.  If she feels like she needs evaluation or antibiotics she should make an appointment.

## 2015-05-18 ENCOUNTER — Ambulatory Visit (INDEPENDENT_AMBULATORY_CARE_PROVIDER_SITE_OTHER): Payer: BLUE CROSS/BLUE SHIELD | Admitting: Family Medicine

## 2015-05-18 ENCOUNTER — Encounter: Payer: Self-pay | Admitting: Family Medicine

## 2015-05-18 VITALS — BP 118/80 | HR 99 | Temp 98.3°F | Ht 62.5 in | Wt 244.0 lb

## 2015-05-18 DIAGNOSIS — J069 Acute upper respiratory infection, unspecified: Secondary | ICD-10-CM | POA: Diagnosis not present

## 2015-05-18 MED ORDER — AZITHROMYCIN 250 MG PO TABS: ORAL_TABLET | ORAL | Status: DC

## 2015-05-18 MED ORDER — BENZONATATE 200 MG PO CAPS: 200.0000 mg | ORAL_CAPSULE | Freq: Three times a day (TID) | ORAL | Status: DC | PRN

## 2015-05-18 NOTE — Patient Instructions (Signed)
Thank you for coming to the clinic today. It was nice seeing you.  I think you have a cold. This is usually a virus and gets better in 7-10 days. We will send in a prescription for azithromycin. If you are not feeling better in 2-3 days, please start this medication. We will also send in a prescription for cough.   If you start to develop severe fevers, chest pain, or shortness of breath, please let us know or call 911.  Take care,  Dr Jerline Pain

## 2015-05-18 NOTE — Progress Notes (Signed)
    Subjective:  Morgan Collins is a 59 y.o. female who presents to the [facility] today for same day appointment with a chief complaint of cough.   HPI:  Cough Patient's symptoms started 5-6 days ago with a sore throat. She took dayquil which improved the sore throat, however she progressed to having severe productive cough and wheezing. Sputum is yellow-green in color. Denies any shortness of breath. Only has mild abdominal pain when coughing. Was around her grandchildren who were sick last week. No fevers. No nausea or vomiting. Has not tried any other medications. Feels like she is doing worse overall. Was diagnosed with bronchitis 6 months ago for a cough that lasted a month and was given a z-pack, which helped. Wants to get antibiotics again today so that she "doesn't have to wait a month again."  ROS: Per HPI  PMH:  The following were reviewed and entered/updated in epic: Past Medical History  Diagnosis Date  . Hypertension   . Thyroid disease   . High cholesterol   . Depressed   . Hypothyroidism   . Stroke [facility])     1 yrs ago -slower ,but not a problem   Patient Active Problem List   Diagnosis Date Noted  . Elevated serum creatinine 04/21/2015  . Diabetes type 2, uncontrolled ([place]) 03/23/2015  . Hyperparathyroidism, primary ([place]) 02/09/2015  . Vitamin D deficiency 02/09/2015  . Postsurgical menopause 02/09/2015  . Osteoporosis 12/28/2014  . Hypercalcemia 10/25/2014  . Depression 10/25/2014  . Nontraumatic subcortical hemorrhage of cerebral hemisphere ([place]) 03/08/2014  . HLD (hyperlipidemia) 03/08/2014  . Essential hypertension 03/08/2014  . Dyspnea 01/19/2014  . Obesities, morbid ([place]) 11/22/2013  . Reactive depression 10/20/2013  . Hypothyroidism 08/18/2013  . Thalamic hemorrhage ([place]) 08/04/2013  . Stroke ([place]) 07/27/2013  . ICH (intracerebral hemorrhage) ([place]) 07/27/2013  . Obstructive hydrocephalus 07/27/2013   Past Surgical History  Procedure Laterality Date    . Appendectomy    . C sections    . Abdominal hysterectomy    . Tracheostomy      past history of early 20's- closed now.  . Colonoscopy with propofol N/A 12/30/2014    Procedure: COLONOSCOPY WITH PROPOFOL;  Surgeon: Carol Ada, MD;  Location: [facility];  Service: Endoscopy;  Laterality: N/A;    Objective:  Physical Exam: BP 118/80 mmHg  Pulse 99  Temp(Src) 98.3 F (36.8 C) (Oral)  Ht 5' 2.5" (1.588 m)  Wt 244 lb (110.678 kg)  BMI 43.89 kg/m2  SpO2 93%  Gen: NAD, resting comfortably, speaking in full sentences  HEENT: MMM, OP clear with no erythema. No facial tenderness.  CV: RRR with no murmurs appreciated Lungs: NWOB, CTAB with occasional ronchi. No crackles or wheezes. Occasional cough.  GI: Normal bowel sounds present. Soft, Nontender, Nondistended. MSK: no edema, cyanosis, or clubbing noted Skin: warm, dry Neuro: grossly normal, moves all extremities Psych: Normal affect and thought content  Assessment/Plan:  Cough Likely viral URI. No red flag signs or symptoms of pneumonia. No history of asthma or COPD. Dicussed typical course of illness with patient. Will give tessalon for cough. Will give a prescription for a course of azithromycin. Instructed patient to start only if not improvingin the next 2-3 days. Patient agreed to this. Return precautions reviewed.   Algis Greenhouse. Jerline Pain, [facility] Resident PGY-2 05/18/2015 2:08 PM

## 2015-05-19 ENCOUNTER — Ambulatory Visit (INDEPENDENT_AMBULATORY_CARE_PROVIDER_SITE_OTHER): Payer: BLUE CROSS/BLUE SHIELD | Admitting: Endocrinology

## 2015-05-19 ENCOUNTER — Encounter: Payer: Self-pay | Admitting: Endocrinology

## 2015-05-19 VITALS — BP 120/68 | HR 84 | Temp 98.4°F | Resp 16 | Ht 62.5 in | Wt 248.4 lb

## 2015-05-19 DIAGNOSIS — E210 Primary hyperparathyroidism: Secondary | ICD-10-CM | POA: Diagnosis not present

## 2015-05-19 DIAGNOSIS — E559 Vitamin D deficiency, unspecified: Secondary | ICD-10-CM

## 2015-05-19 LAB — BASIC METABOLIC PANEL
BUN: 19 mg/dL (ref 6–23)
CO2: 28 mEq/L (ref 19–32)
Calcium: 11.4 mg/dL — ABNORMAL HIGH (ref 8.4–10.5)
Chloride: 103 mEq/L (ref 96–112)
Creatinine, Ser: 0.84 mg/dL (ref 0.40–1.20)
GFR: 89.03 mL/min (ref >60.00)
Glucose, Bld: 122 mg/dL — ABNORMAL HIGH (ref 70–99)
Potassium: 4.5 mEq/L (ref 3.5–5.1)
Sodium: 138 mEq/L (ref 135–145)

## 2015-05-19 LAB — VITAMIN D 25 HYDROXY (VIT D DEFICIENCY, FRACTURES): VITD: 25.62 ng/mL — ABNORMAL LOW (ref 30.00–100.00)

## 2015-05-19 NOTE — Progress Notes (Signed)
Patient ID: Morgan Collins, female   DOB: August 09, 1955, 59 y.o.   MRN: [id number]           Chief complaint: High calcium and osteoporosis  History of Present Illness:  Referring physician:  She was initially evaluated for hyperparathyroidism in 8/16   Review of records show that she has had a high calcium since about 2014, she had upper normal level in 2013 and previous records are not available.  Her calcium done about 4 weeks ago appears to be higher than usual but has been as high as 11.6 in the past  Lab Results  Component Value Date   CALCIUM 11.5* 04/21/2015   CALCIUM 10.7* 03/23/2015   CALCIUM 11.0* 02/09/2015   CALCIUM 10.8* 10/26/2014   CALCIUM 10.0 10/11/2014   CALCIUM 11.1* 07/26/2014   CALCIUM 11.6* 08/30/2013   CALCIUM 11.4* 08/18/2013   CALCIUM 11.2* 08/07/2013    The hypercalcemia is not associated with any  fractures, renal insufficiency, history of kidney stones, sarcoidosis    Prior serologic and radiologic studies have included:  Lab Results  Component Value Date   PTH 123* 10/11/2014   CALCIUM 11.5* 04/21/2015   CAION 1.57* 10/26/2014   PHOS 2.3 02/09/2015      25 ([place]) Vitamin D level was 26 was started on vitamin D3, 2000 units daily   She has not been on any treatment and she is now being referred for further management She has not been advised any vitamin D supplements, currently not taking any calcium supplements  OSTEOPOROSIS:  She had a bone density checked because of her history of hyperparathyroidism   Bone mineral density done in 11/2014 showed T score of -3.5 at the spine but only -1.4 at the femur She apparently had oophorectomy in her 14s and her second ovary was removed at about the age of 39 because of a large cyst She has never been given hormone supplements  She does not think she has had any height loss   She was started on Actonel 150 mg monthly on her initial consultation in 8/16     Medication List       This list  is accurate as of: 05/19/15  1:55 PM.  Always use your most recent med list.               acetaminophen 325 MG tablet  Commonly known as:  TYLENOL  Take 650 mg by mouth every 4 (four) hours as needed for moderate pain or headache.     aspirin EC 81 MG tablet  Take 1 tablet (81 mg total) by mouth daily.     atorvastatin 80 MG tablet  Commonly known as:  LIPITOR  Take 1 tablet (80 mg total) by mouth daily.     azithromycin 250 MG tablet  Commonly known as:  ZITHROMAX  Take 2 pills the first day, then 1 pill per day for 4 days.     benzonatate 200 MG capsule  Commonly known as:  TESSALON  Take 1 capsule (200 mg total) by mouth 3 (three) times daily as needed for cough.     citalopram 20 MG tablet  Commonly known as:  CELEXA  TAKE ONE TABLET BY MOUTH AT BEDTIME     hydrochlorothiazide 12.5 MG tablet  Commonly known as:  HYDRODIURIL  TAKE ONE TABLET BY MOUTH ONCE DAILY     levothyroxine 100 MCG tablet  Commonly known as:  SYNTHROID, LEVOTHROID  Take 1 tablet (100 mcg total) by  mouth daily.     losartan 100 MG tablet  Commonly known as:  COZAAR  TAKE ONE TABLET BY MOUTH ONCE DAILY     metFORMIN 500 MG tablet  Commonly known as:  GLUCOPHAGE  Take 1 tablet (500 mg total) by mouth 2 (two) times daily with a meal.     risedronate 150 MG tablet  Commonly known as:  ACTONEL  Take 1 tablet (150 mg total) by mouth every 30 (thirty) days. with water on empty stomach, nothing by mouth or lie down for next 30 minutes.     VITAMIN D (ERGOCALCIFEROL) PO  Take by mouth.        Allergies: No Known Allergies  Past Medical History  Diagnosis Date  . Hypertension   . Thyroid disease   . High cholesterol   . Depressed   . Hypothyroidism   . Stroke [facility])     1 yrs ago -slower ,but not a problem    Past Surgical History  Procedure Laterality Date  . Appendectomy    . C sections    . Abdominal hysterectomy    . Tracheostomy      past history of early 20's- closed now.    . Colonoscopy with propofol N/A 12/30/2014    Procedure: COLONOSCOPY WITH PROPOFOL;  Surgeon: Carol Ada, MD;  Location: [facility];  Service: Endoscopy;  Laterality: N/A;    No family history on file.  Social History:  reports that she has never smoked. She does not have any smokeless tobacco history on file. She reports that she does not drink alcohol or use illicit drugs.  ROS    Hypothyroid since ? 2006 , appears to have received radioactive iodine treatment for hyperthyroidism at that time She does not complain of any unusual fatigue and her thyroid level is recently normal  Lab Results  Component Value Date   TSH 1.015 04/21/2015   TSH 2.053 10/26/2014   TSH 18.807* 08/18/2013   FREET4 1.17 03/03/2014    She is on metformin now for mild diabetes with A1c 7.6, last glucose below 100  EXAM:  BP 120/68 mmHg  Pulse 84  Temp(Src) 98.4 F (36.9 C)  Resp 16  Ht 5' 2.5" (1.588 m)  Wt 248 lb 6.4 oz (112.674 kg)  BMI 44.68 kg/m2  SpO2 96%    Assessment/Plan:   HYPERCALCEMIA: This appears to be persistent and variable secondary to hyperparathyroidism Most recent calcium is 11.5 which is higher than before Again she is asymptomatic   Although she has osteoporosis she also had premature menopause Currently taking Actonel without any side effects Also on vitamin D for a relatively low level   Discussed with the patient that if her calcium continues to increase may consider parathyroid surgery Will check her calcium again today and decide Also check vitamin D level  Will plan on doing another bone density in a year   Morgan Collins 05/19/2015, 1:55 PM

## 2015-05-25 LAB — VITAMIN D 1,25 DIHYDROXY
Vitamin D 1, 25 (OH)2 Total: 70 pg/mL
Vitamin D2 1, 25 (OH)2: <10 pg/mL
Vitamin D3 1, 25 (OH)2: 70 pg/mL

## 2015-06-22 ENCOUNTER — Other Ambulatory Visit: Payer: Self-pay | Admitting: *Deleted

## 2015-06-22 ENCOUNTER — Telehealth: Payer: Self-pay | Admitting: Endocrinology

## 2015-06-22 MED ORDER — RISEDRONATE SODIUM 150 MG PO TABS: 150.0000 mg | ORAL_TABLET | ORAL | Status: DC

## 2015-06-22 NOTE — Telephone Encounter (Signed)
rx sent

## 2015-06-22 NOTE — Telephone Encounter (Signed)
Patient need refill of risedronate (ACTONEL) 150 MG tablet send to [place]

## 2015-06-23 ENCOUNTER — Telehealth: Payer: Self-pay | Admitting: Family Medicine

## 2015-06-23 ENCOUNTER — Ambulatory Visit (INDEPENDENT_AMBULATORY_CARE_PROVIDER_SITE_OTHER): Payer: BLUE CROSS/BLUE SHIELD | Admitting: Family Medicine

## 2015-06-23 ENCOUNTER — Ambulatory Visit
Admission: RE | Admit: 2015-06-23 | Discharge: 2015-06-23 | Disposition: A | Discharge status: Home / Self Care | Payer: BLUE CROSS/BLUE SHIELD | Source: Ambulatory Visit | Attending: Family Medicine | Admitting: Family Medicine

## 2015-06-23 VITALS — BP 141/95 | HR 104 | Temp 98.3°F | Ht 62.5 in | Wt 239.5 lb

## 2015-06-23 DIAGNOSIS — R059 Cough, unspecified: Secondary | ICD-10-CM

## 2015-06-23 DIAGNOSIS — J209 Acute bronchitis, unspecified: Secondary | ICD-10-CM | POA: Diagnosis not present

## 2015-06-23 DIAGNOSIS — R05 Cough: Secondary | ICD-10-CM

## 2015-06-23 MED ORDER — ALBUTEROL SULFATE HFA 108 (90 BASE) MCG/ACT IN AERS: INHALATION_SPRAY | RESPIRATORY_TRACT | Status: DC

## 2015-06-23 MED ORDER — PREDNISONE 20 MG PO TABS: 40.0000 mg | ORAL_TABLET | Freq: Every day | ORAL | Status: DC

## 2015-06-23 NOTE — Patient Instructions (Signed)
It was a pleasure seeing you today in our clinic. Today we discussed your cough. Here is the treatment plan we have discussed and agreed upon together:   - I've ordered a chest x-ray to be done at [facility] on [address]. Please get this done today before the end of office hours. - Once I obtain the results of this x-ray I will contact you with further treatment options, and the additional plans.

## 2015-06-23 NOTE — Assessment & Plan Note (Signed)
Patient presents with persistent cough over the past 6 days. Signs and symptoms consistent with bronchitis versus reactive airway disease versus pneumonia. Diffusely wheezing noted on exam without significant crackles or reduced breath sounds. Chest x-ray ordered today showed no signs of consolidation. Effusion was present on the right side exposing the major fissure of the right lung. There is no sign of vascular congestion or cardiomegaly. All signs today pointing in the direction of acute bronchitis secondary to a viral etiology. Because of this no antibiotic treatment warranted at this time. - We'll treat wheezing and shortness of breath with 5 day course of prednisone 40 mg daily. As well as an albuterol inhaler 2 puffs every 6 hours for the first 36 hours then every 6 hours when necessary after that. - Patient was called with the results of her chest x-ray as well as the treatment regimen described above. Patient stated her understanding. I informed her that if she develops any fevers or worsening shortness of breath over the next 1-4 days and she should call our office or report to the nearest urgent care/emergency department for evaluation.

## 2015-06-23 NOTE — Telephone Encounter (Signed)
Will forward to Dr. Alease Frame who saw patient today for this visit. Kenlyn Lose,CMA

## 2015-06-23 NOTE — Telephone Encounter (Signed)
Pt called and would like to know what her x-ray results were. jw °

## 2015-06-23 NOTE — Progress Notes (Signed)
COUGH: Same-day clinic This past weekend. Gradually worsening. Better in the AM, gets worse as the day progresses.  Has been coughing for 6-7 days. Cough is: productive Sputum production: yes Medications tried: azithro, mucinex, DayQuil Taking blood pressure medications: yes  Symptoms Runny nose: yes Mucous in back of throat: yes Throat burning or reflux: no Wheezing or asthma: yes Fever: no Chest Pain: no Shortness of breath: no Leg swelling: no Hemoptysis: no Weight loss: no  ROS see HPI Smoking Status noted  Objective: BP 141/95 mmHg  Pulse 104  Temp(Src) 98.3 F (36.8 C) (Oral)  Ht 5' 2.5" (1.588 m)  Wt 239 lb 8 oz (108.636 kg)  BMI 43.08 kg/m2 Gen: NAD, alert, cooperative, and pleasant. HEENT: NCAT, EOMI, PERRL, TMs clear, oropharynx erythematous without exudate. Nasal mucosa with clear rhinorrhea present. Neck FROM CV: RRR, no murmur Resp: diffuse wheezing throughout. No crackles. Ext: No edema, warm Neuro: Alert and oriented, Speech clear, No gross deficits  CXR: Obtained and read by me personally today. Effusion noted at the major fissure of the right lung. No obvious signs of consolidation. Lung fields reduced bilaterally. No signs of vascular congestion or cardiomegaly.  Assessment and plan:  Acute bronchitis Patient presents with persistent cough over the past 6 days. Signs and symptoms consistent with bronchitis versus reactive airway disease versus pneumonia. Diffusely wheezing noted on exam without significant crackles or reduced breath sounds. Chest x-ray ordered today showed no signs of consolidation. Effusion was present on the right side exposing the major fissure of the right lung. There is no sign of vascular congestion or cardiomegaly. All signs today pointing in the direction of acute bronchitis secondary to a viral etiology. Because of this no antibiotic treatment warranted at this time. - We'll treat wheezing and shortness of breath with 5 day  course of prednisone 40 mg daily. As well as an albuterol inhaler 2 puffs every 6 hours for the first 36 hours then every 6 hours when necessary after that. - Patient was called with the results of her chest x-ray as well as the treatment regimen described above. Patient stated her understanding. I informed her that if she develops any fevers or worsening shortness of breath over the next 1-4 days and she should call our office or report to the nearest urgent care/emergency department for evaluation.    Orders Placed This Encounter  Procedures  . DG Chest 2 View    Standing Status: Future     Number of Occurrences: 1     Standing Expiration Date: 08/20/2016    Order Specific Question:  Reason for Exam (SYMPTOM  OR DIAGNOSIS REQUIRED)    Answer:  Cough/Wheeze    Order Specific Question:  Is the patient pregnant?    Answer:  No    Order Specific Question:  Preferred imaging location?    Answer:  [facility]    Meds ordered this encounter  Medications  . predniSONE (DELTASONE) 20 MG tablet    Sig: Take 2 tablets (40 mg total) by mouth daily. Until the bottle is empty.    Dispense:  10 tablet    Refill:  0  . albuterol (PROVENTIL HFA;VENTOLIN HFA) 108 (90 Base) MCG/ACT inhaler    Sig: 2 puffs every 6 hours for the first 36 hours (1.5 days). Then, 2 puffs every 6 hours as needed for wheezing/shortness of breath after that.    Dispense:  1 Inhaler    Refill:  0     Marylynn Pearson Aurilla Coulibaly,  MD,MS,  PGY2 06/23/2015 6:48 PM

## 2015-07-24 ENCOUNTER — Other Ambulatory Visit: Payer: Self-pay | Admitting: Family Medicine

## 2015-07-26 ENCOUNTER — Encounter: Payer: Self-pay | Admitting: Family Medicine

## 2015-07-26 ENCOUNTER — Ambulatory Visit (INDEPENDENT_AMBULATORY_CARE_PROVIDER_SITE_OTHER): Payer: BLUE CROSS/BLUE SHIELD | Admitting: Family Medicine

## 2015-07-26 VITALS — BP 124/74 | HR 84 | Temp 97.8°F | Wt 241.0 lb

## 2015-07-26 DIAGNOSIS — I10 Essential (primary) hypertension: Secondary | ICD-10-CM

## 2015-07-26 DIAGNOSIS — IMO0001 Reserved for inherently not codable concepts without codable children: Secondary | ICD-10-CM

## 2015-07-26 DIAGNOSIS — E1165 Type 2 diabetes mellitus with hyperglycemia: Secondary | ICD-10-CM | POA: Diagnosis not present

## 2015-07-26 DIAGNOSIS — E785 Hyperlipidemia, unspecified: Secondary | ICD-10-CM | POA: Diagnosis not present

## 2015-07-26 LAB — POCT GLYCOSYLATED HEMOGLOBIN (HGB A1C): Hemoglobin A1C: 6.7

## 2015-07-26 MED ORDER — LOSARTAN POTASSIUM 100 MG PO TABS: 100.0000 mg | ORAL_TABLET | Freq: Every day | ORAL | Status: DC

## 2015-07-26 MED ORDER — AMLODIPINE BESYLATE 10 MG PO TABS: 10.0000 mg | ORAL_TABLET | Freq: Every day | ORAL | Status: DC

## 2015-07-26 NOTE — Assessment & Plan Note (Signed)
A1c 6.7 today.  She received a meter from her insurance company.  -  Continue metformin 1000 mg twice a day;  She is tolerating this well without GI symptoms -  Follow-up in 3 months -  Advised establishing care with ophthalmologist

## 2015-07-26 NOTE — Assessment & Plan Note (Signed)
Tolerating Statin. Some muscle fatigue, but possible related to hyperparathyroidism or depression -Recheck Lipids in 3 months, and reassess muscle fatigue

## 2015-07-26 NOTE — Assessment & Plan Note (Signed)
Blood pressure well controlled today.  -  Will discontinue hydrochlorothiazide,  As could be contributing to hypercalcemia - Caused by her hyperparathyroidism -  Start Norvasc 10 mg daily -  Follow-up in 2 weeks for nurse blood pressure check

## 2015-07-26 NOTE — Patient Instructions (Signed)
It was great seeing you today.   1. Stop Hydrochlorothiazide 2. Start Norvasc 10mg  once a day.  3. Call and schedule appointment with eye doctor for diabetes eye exam    Please bring all your medications to every doctors visit  Sign up for My Chart to have easy access to your labs results, and communication with your Primary care physician.  Next Appointment  Please make an appointment with Dr Berkley Harvey in 3 months for diabetes, blood pressure  Make Nurse visit in 2 weeks for blood pressure check   I look forward to talking with you again at our next visit. If you have any questions or concerns before then, please call the clinic at [phone].  Take Care,   Dr Phill Myron

## 2015-07-26 NOTE — Progress Notes (Signed)
  Patient name: Morgan Collins MRN [id number]  Date of birth: February 21, 1956  CC & HPI:  Morgan Collins is a 60 y.o. female presenting today for DM, HTN, HLD.   DIABETES  Lab Results  Component Value Date   HGBA1C 6.7 07/26/2015    Blood Sugar Ranges: < 150  Symptoms of Hypoglycemia? no  Comorbid Symptoms:  no Neuropathy: no Vision problems  Medication Side Effects: no GI symptoms  CHRONIC HYPERTENSION BP Readings from Last 3 Encounters:  07/26/15 124/74  06/23/15 141/95  05/19/15 120/68    Disease Monitoring  Blood pressure range outside clinc: not checking  Chest pain: no   Dyspnea: no   Claudication: no  Medication Side Effects: NO dizziness, cough, rash   HLD Lab Results  Component Value Date   LDLCALC 127* 07/26/2014    Medication Compliance: yes  Side Effects?: some muscle pain or weakness. NO RUQ pain; jaundice  Medication compliance: yes   ROS: See HPI   Medical & Surgical Hx:  Reviewed  Medications & Allergies: Reviewed  Social History: Reviewed:   Objective Findings:  Vitals: BP 124/74 mmHg  Pulse 84  Temp(Src) 97.8 F (36.6 C) (Oral)  Wt 241 lb (109.317 kg)  Gen: NAD CV: RRR w/o m/r/g, pulses +2 b/l Resp: CTAB w/ normal respiratory effort Foot Exam: Pulses 2+; No Ulcers, bruises or cuts; Monofilament testing: Sensation intact b/l   Assessment & Plan:   Diabetes type 2, uncontrolled ([facility])  A1c 6.7 today.  She received a meter from her insurance company.  -  Continue metformin 1000 mg twice a day;  She is tolerating this well without GI symptoms -  Follow-up in 3 months -  Advised establishing care with ophthalmologist  Essential hypertension  Blood pressure well controlled today.  -  Will discontinue hydrochlorothiazide,  As could be contributing to hypercalcemia - Caused by her hyperparathyroidism -  Start Norvasc 10 mg daily -  Follow-up in 2 weeks for nurse blood pressure check  HLD (hyperlipidemia) Tolerating Statin. Some  muscle fatigue, but possible related to hyperparathyroidism or depression -Recheck Lipids in 3 months, and reassess muscle fatigue

## 2015-08-14 ENCOUNTER — Telehealth: Payer: Self-pay | Admitting: Endocrinology

## 2015-08-14 ENCOUNTER — Other Ambulatory Visit: Payer: Self-pay | Admitting: *Deleted

## 2015-08-14 ENCOUNTER — Other Ambulatory Visit (INDEPENDENT_AMBULATORY_CARE_PROVIDER_SITE_OTHER): Payer: BLUE CROSS/BLUE SHIELD

## 2015-08-14 DIAGNOSIS — E210 Primary hyperparathyroidism: Secondary | ICD-10-CM

## 2015-08-14 LAB — BASIC METABOLIC PANEL
BUN: 18 mg/dL (ref 6–23)
CO2: 28 mEq/L (ref 19–32)
Calcium: 11 mg/dL — ABNORMAL HIGH (ref 8.4–10.5)
Chloride: 106 mEq/L (ref 96–112)
Creatinine, Ser: 0.79 mg/dL (ref 0.40–1.20)
GFR: 95.48 mL/min (ref >60.00)
Glucose, Bld: 113 mg/dL — ABNORMAL HIGH (ref 70–99)
Potassium: 4.3 mEq/L (ref 3.5–5.1)
Sodium: 139 mEq/L (ref 135–145)

## 2015-08-14 MED ORDER — RISEDRONATE SODIUM 150 MG PO TABS: 150.0000 mg | ORAL_TABLET | ORAL | Status: DC

## 2015-08-14 NOTE — Telephone Encounter (Signed)
Actonel was sent in in January with 4 refills, she needs to call her pharmacy, patient's daughter is aware and will let her mom know.

## 2015-08-14 NOTE — Telephone Encounter (Signed)
actonel needs to be called into the [facility] on  [address] please not the [facility]

## 2015-08-17 ENCOUNTER — Ambulatory Visit: Payer: BLUE CROSS/BLUE SHIELD | Admitting: Endocrinology

## 2015-08-20 ENCOUNTER — Other Ambulatory Visit: Payer: Self-pay | Admitting: Family Medicine

## 2015-08-21 ENCOUNTER — Ambulatory Visit: Payer: BLUE CROSS/BLUE SHIELD | Admitting: Endocrinology

## 2015-08-29 ENCOUNTER — Other Ambulatory Visit: Payer: Self-pay | Admitting: Family Medicine

## 2015-08-29 MED ORDER — ASPIRIN EC 81 MG PO TBEC: 81.0000 mg | DELAYED_RELEASE_TABLET | Freq: Every day | ORAL | Status: DC

## 2015-08-29 NOTE — Telephone Encounter (Signed)
Spoke with patient and she needs her aspirin refilled now.  She will have pharmacy contact us for refills in future. Laural Eiland,CMA

## 2015-08-29 NOTE — Telephone Encounter (Signed)
Patient want [facility] meds [facility] to [facility] on  [address]

## 2015-08-31 ENCOUNTER — Encounter: Payer: Self-pay | Admitting: Endocrinology

## 2015-08-31 ENCOUNTER — Ambulatory Visit (INDEPENDENT_AMBULATORY_CARE_PROVIDER_SITE_OTHER): Payer: BLUE CROSS/BLUE SHIELD | Admitting: Endocrinology

## 2015-08-31 ENCOUNTER — Ambulatory Visit (INDEPENDENT_AMBULATORY_CARE_PROVIDER_SITE_OTHER): Payer: BLUE CROSS/BLUE SHIELD | Admitting: *Deleted

## 2015-08-31 VITALS — BP 120/84 | HR 88 | Temp 97.9°F | Resp 14 | Ht 62.5 in | Wt 242.6 lb

## 2015-08-31 VITALS — BP 140/98 | HR 72

## 2015-08-31 DIAGNOSIS — E210 Primary hyperparathyroidism: Secondary | ICD-10-CM | POA: Diagnosis not present

## 2015-08-31 DIAGNOSIS — Z136 Encounter for screening for cardiovascular disorders: Secondary | ICD-10-CM

## 2015-08-31 DIAGNOSIS — I10 Essential (primary) hypertension: Secondary | ICD-10-CM

## 2015-08-31 DIAGNOSIS — E559 Vitamin D deficiency, unspecified: Secondary | ICD-10-CM

## 2015-08-31 DIAGNOSIS — Z0130 Encounter for examination of blood pressure without abnormal findings: Secondary | ICD-10-CM

## 2015-08-31 NOTE — Progress Notes (Signed)
Patient ID: Morgan Collins, female   DOB: [birth year], 60 y.o.   MRN: [id number]           Chief complaint: High calcium and osteoporosis  History of Present Illness:  Referring physician:  She was initially evaluated for hyperparathyroidism in 8/16  Review of records show that she has had a high calcium since about 2014, she had upper normal level in 2013 and previous records are not available.  Her calcium levels have ranged between 10.8-11.6 Because of her relatively high levels of 11.5 and 11.4 recently she was told to come back for follow-up Also her PCP has stopped her HCTZ in 12/16  She continues to be asymptomatic with no unusual weight loss, nausea or fatigue and no history of fractures  Lab Results  Component Value Date   CALCIUM 11.0* 08/14/2015   CALCIUM 11.4* 05/19/2015   CALCIUM 11.5* 04/21/2015   CALCIUM 10.7* 03/23/2015   CALCIUM 11.0* 02/09/2015   CALCIUM 10.8* 10/26/2014   CALCIUM 10.0 10/11/2014   CALCIUM 11.1* 07/26/2014   CALCIUM 11.6* 08/30/2013     Prior serologic and radiologic studies have included:  Lab Results  Component Value Date   PTH 123* 10/11/2014   CALCIUM 11.0* 08/14/2015   CAION 1.57* 10/26/2014   PHOS 2.3 02/09/2015      25 (OH) Vitamin D level was 26 was started on vitamin D3, 2000 units daily Since 12/16  Lab Results  Component Value Date   VD25OH 25.62* 05/19/2015      OSTEOPOROSIS:  She had a bone density checked because of her history of hyperparathyroidism   Bone mineral density done in 11/2014 showed T score of -3.5 at the spine but only -1.4 at the femur She apparently had oophorectomy in her 14s and her second ovary was removed at about the age of 41 because of a large cyst She has never been given hormone supplements No history of height loss  She was started on Actonel 150 mg monthly on her initial consultation in 8/16 However with some difficulties with refills she did not take the tablets in January or  February     Medication List       This list is accurate as of: 08/31/15  3:06 PM.  Always use your most recent med list.               acetaminophen 325 MG tablet  Commonly known as:  TYLENOL  Take 650 mg by mouth every 4 (four) hours as needed for moderate pain or headache.     albuterol 108 (90 Base) MCG/ACT inhaler  Commonly known as:  PROVENTIL HFA;VENTOLIN HFA  2 puffs every 6 hours for the first 36 hours (1.5 days). Then, 2 puffs every 6 hours as needed for wheezing/shortness of breath after that.     amLODipine 10 MG tablet  Commonly known as:  NORVASC  Take 1 tablet (10 mg total) by mouth daily.     aspirin EC 81 MG tablet  Take 1 tablet (81 mg total) by mouth daily.     atorvastatin 80 MG tablet  Commonly known as:  LIPITOR  TAKE 1 TABLET BY MOUTH EVERY DAY     benzonatate 200 MG capsule  Commonly known as:  TESSALON  Take 1 capsule (200 mg total) by mouth 3 (three) times daily as needed for cough.     citalopram 20 MG tablet  Commonly known as:  CELEXA  TAKE ONE TABLET BY MOUTH AT BEDTIME  levothyroxine 100 MCG tablet  Commonly known as:  SYNTHROID, LEVOTHROID  Take 1 tablet (100 mcg total) by mouth daily.     losartan 100 MG tablet  Commonly known as:  COZAAR  Take 1 tablet (100 mg total) by mouth daily.     metFORMIN 500 MG tablet  Commonly known as:  GLUCOPHAGE  Take 1 tablet (500 mg total) by mouth 2 (two) times daily with a meal.     risedronate 150 MG tablet  Commonly known as:  ACTONEL  Take 1 tablet (150 mg total) by mouth every 30 (thirty) days. with water on empty stomach, nothing by mouth or lie down for next 30 minutes.     VITAMIN D (ERGOCALCIFEROL) PO  Take by mouth.        Allergies: No Known Allergies  Past Medical History  Diagnosis Date  . Hypertension   . Thyroid disease   . High cholesterol   . Depressed   . Hypothyroidism   . Stroke [facility])     1 yrs ago -slower ,but not a problem    Past Surgical History    Procedure Laterality Date  . Appendectomy    . C sections    . Abdominal hysterectomy    . Tracheostomy      past history of early 20's- closed now.  . Colonoscopy with propofol N/A 12/30/2014    Procedure: COLONOSCOPY WITH PROPOFOL;  Surgeon: Morgan Ada, MD;  Location: [facility];  Service: Endoscopy;  Laterality: N/A;    No family history on file.  Social History:  reports that she has never smoked. She does not have any smokeless tobacco history on file. She reports that she does not drink alcohol or use illicit drugs.  ROS    Hypothyroid since ? 2006 , appears to have received radioactive iodine treatment for hyperthyroidism at that time She does not complain of any unusual fatigue and her thyroid level is recently normal  Lab Results  Component Value Date   TSH 1.015 04/21/2015   TSH 2.053 10/26/2014   TSH 18.807* 08/18/2013   FREET4 1.17 03/03/2014    She is on metformin now for mild diabetes with A1c 6.7  Lab Results  Component Value Date   HGBA1C 6.7 07/26/2015   HGBA1C 7.6 03/23/2015   HGBA1C 6.3 08/18/2013   Lab Results  Component Value Date   LDLCALC 127* 07/26/2014   CREATININE 0.79 08/14/2015     Chronic edema ankles   EXAM:  BP 120/84 mmHg  Pulse 88  Temp(Src) 97.9 F (36.6 C)  Resp 14  Ht 5' 2.5" (1.588 m)  Wt 242 lb 9.6 oz (110.043 kg)  BMI 43.64 kg/m2  SpO2 96%    Assessment/Plan:   HYPERCALCEMIA: This appears to be persistent And mostly 11-11.5 range, secondary to hyperparathyroidism Most recent calcium is 11.0 and appears to be stable or improved May have improved slightly with stopping HCTZ also Again she is asymptomatic  Has osteoporosis but not clear this is from premature menopause or hyperparathyroidism Currently taking Actonel without any side effects Also on vitamin D for a relatively low level  Will have her continue to be monitored periodically Also check vitamin D level on the next visit  Will plan on doing  another bone density before next visit in 6 months    Morgan Collins 08/31/2015, 3:06 PM

## 2015-08-31 NOTE — Progress Notes (Signed)
    Patient presents in NAD for BP check after starting norvasc 10 mg 4 weeks ago. States she's taking med daily but at different times during day. Last dose sometime yesterday. Has not taken cozaar today either. Advisied patient to take meds same time every day Patient does have a pill box for organization but has a hard time remembering to take them. Discussed setting alarms on cell phone to remind her when to take meds. Patient states she thinks this is something she can do going forward.  Discussed need for low sodium diet and using Mrs. Dash as alternative to salt, however, patient states she likes food and has tried Mrs. DASH in past but didn't like it. Discussed walking for exercise but patient states she becomes [place] she is planning on exercising at [employer] starting next month.  Filed Vitals:   08/31/15 1153  BP: 140/98  Pulse: 72  BP take left arm manually with adult cuff after sitting 10 full min  Made appt for patient to be seen in nurse clinic for BP recheck next week (09/07/15 at 11:30 AM) Patient states she will be sure to take BP meds before coming to that appt. Velora Heckler, RN

## 2015-09-07 ENCOUNTER — Ambulatory Visit (INDEPENDENT_AMBULATORY_CARE_PROVIDER_SITE_OTHER): Payer: BLUE CROSS/BLUE SHIELD | Admitting: *Deleted

## 2015-09-07 VITALS — BP 130/82 | HR 85

## 2015-09-07 DIAGNOSIS — Z136 Encounter for screening for cardiovascular disorders: Secondary | ICD-10-CM

## 2015-09-07 DIAGNOSIS — I10 Essential (primary) hypertension: Secondary | ICD-10-CM

## 2015-09-07 DIAGNOSIS — Z0130 Encounter for examination of blood pressure without abnormal findings: Secondary | ICD-10-CM

## 2015-09-07 NOTE — Progress Notes (Signed)
   Patient in nurse clinic for blood pressure check. Patient denies any chest pain, SOB, dizziness, headache, n/v, visual changes or numbness/tingling of extremities.  Patient stated she is taking all medications as prescribed. Patient took blood pressure medications this AM.  Patient is still trying to remember to take medications at same time everyday.  Patient stated when she is home is sleeps a lot.  Asked if any of her medications are making her sleepy, patient stated no she just sit around doing nothing and falls asleep.  Advised patient to try and be more active during the day by taking a walk or some form of exercise to help with lower blood pressure and weight.  Patient stated understanding.  Derl Barrow, RN  Today's Vitals   09/07/15 1144 09/07/15 1154  BP: 144/82 130/82  Pulse: 82 85  SpO2: 94%   PainSc: 0-No pain

## 2015-09-18 ENCOUNTER — Other Ambulatory Visit: Payer: Self-pay | Admitting: *Deleted

## 2015-09-18 DIAGNOSIS — I10 Essential (primary) hypertension: Secondary | ICD-10-CM

## 2015-09-18 MED ORDER — AMLODIPINE BESYLATE 10 MG PO TABS: 10.0000 mg | ORAL_TABLET | Freq: Every day | ORAL | Status: DC

## 2015-09-19 ENCOUNTER — Encounter: Payer: Self-pay | Admitting: Family Medicine

## 2015-09-19 ENCOUNTER — Ambulatory Visit (INDEPENDENT_AMBULATORY_CARE_PROVIDER_SITE_OTHER): Payer: BLUE CROSS/BLUE SHIELD | Admitting: Family Medicine

## 2015-09-19 VITALS — BP 114/78 | HR 89 | Temp 98.3°F | Wt 236.0 lb

## 2015-09-19 DIAGNOSIS — G4733 Obstructive sleep apnea (adult) (pediatric): Secondary | ICD-10-CM | POA: Insufficient documentation

## 2015-09-19 DIAGNOSIS — I10 Essential (primary) hypertension: Secondary | ICD-10-CM

## 2015-09-19 DIAGNOSIS — G4710 Hypersomnia, unspecified: Secondary | ICD-10-CM

## 2015-09-19 DIAGNOSIS — R400 Somnolence: Secondary | ICD-10-CM

## 2015-09-19 NOTE — Progress Notes (Signed)
  Patient name: Morgan Collins MRN [id number]  Date of birth: 1955-09-05  CC & HPI:  Morgan Collins is a 60 y.o. female presenting today for HTN and snoring.   CHRONIC HYPERTENSION  BP Readings from Last 3 Encounters:  09/19/15 114/78  09/07/15 130/82  08/31/15 120/84    Control: good Disease Monitoring  Blood pressure range outside clinc: not checking  Chest pain: no   Dyspnea: no   Claudication: no  Medication compliance: yes, having trouble remembering to take medication sometimes, but takes them at night if she misses am dose  Medication Side Effects: no Dizziness/lightheadedness; LE edema Preventitive Healthcare:   History  Smoking status  . Never Smoker   Smokeless tobacco  . Not on file   Snoring - Reports snoring with occasional gasping sleep - Has daytime fatigue and somnolence - Multiple family members with obstructive sleep apnea  Smoking History Noted  Objective Findings:  Vitals: BP 114/78 mmHg  Pulse 89  Temp(Src) 98.3 F (36.8 C) (Oral)  Wt 236 lb (107.049 kg)  Gen: NAD; Obese CV: RRR w/o m/r/g, pulses +2 b/l Resp: CTAB w/ normal respiratory effort  Assessment & Plan:   Essential hypertension Blood pressure under good control today.  Some difficulty with remembering to take medications daily.  Advised she could take blood pressure medications at night with Lipitor if it helps her remember - Continue Norvasc 10 mg daily, Cozaar 100 mg daily  Daytime somnolence Nocturnal snorting, gasping associated with daytime somnolence.  - Sleep study ordered

## 2015-09-19 NOTE — Assessment & Plan Note (Signed)
Nocturnal snorting, gasping associated with daytime somnolence.  - Sleep study ordered

## 2015-09-19 NOTE — Assessment & Plan Note (Signed)
Blood pressure under good control today.  Some difficulty with remembering to take medications daily.  Advised she could take blood pressure medications at night with Lipitor if it helps her remember - Continue Norvasc 10 mg daily, Cozaar 100 mg daily

## 2015-10-10 ENCOUNTER — Telehealth: Payer: Self-pay | Admitting: *Deleted

## 2015-10-10 NOTE — Telephone Encounter (Signed)
Patient calling about sleep study referral, she states that original referral was sent to [facility] and patient does not want to travel that far patient wants to go to the sleep center in [place].

## 2015-10-10 NOTE — Telephone Encounter (Signed)
I spoke with Tia and she informed me another order is going to have to be put in at [facility] for pt to have her sleep study done here in town.

## 2015-10-12 ENCOUNTER — Other Ambulatory Visit: Payer: Self-pay | Admitting: Family Medicine

## 2015-10-12 DIAGNOSIS — R400 Somnolence: Secondary | ICD-10-CM

## 2015-10-17 ENCOUNTER — Other Ambulatory Visit: Payer: Self-pay | Admitting: *Deleted

## 2015-10-17 DIAGNOSIS — I10 Essential (primary) hypertension: Secondary | ICD-10-CM

## 2015-10-17 MED ORDER — AMLODIPINE BESYLATE 10 MG PO TABS: 10.0000 mg | ORAL_TABLET | Freq: Every day | ORAL | Status: DC

## 2015-10-17 NOTE — Telephone Encounter (Signed)
Request is for a 90 day supply. Genesi Stefanko L, RN  

## 2015-10-19 ENCOUNTER — Other Ambulatory Visit: Payer: Self-pay | Admitting: Family Medicine

## 2015-10-19 NOTE — Telephone Encounter (Signed)
Looks like pt was last seen in clinic in February, please advise.

## 2015-10-24 ENCOUNTER — Other Ambulatory Visit: Payer: Self-pay | Admitting: *Deleted

## 2015-10-24 DIAGNOSIS — R7303 Prediabetes: Secondary | ICD-10-CM

## 2015-10-24 NOTE — Telephone Encounter (Signed)
90 day supply. Jozie Wulf L, RN  

## 2015-10-25 MED ORDER — METFORMIN HCL 500 MG PO TABS: 500.0000 mg | ORAL_TABLET | Freq: Two times a day (BID) | ORAL | Status: DC

## 2015-10-25 NOTE — Telephone Encounter (Signed)
2nd request.  Nobel Brar L, RN  

## 2015-11-08 ENCOUNTER — Other Ambulatory Visit: Payer: Self-pay | Admitting: Family Medicine

## 2015-12-07 ENCOUNTER — Ambulatory Visit (HOSPITAL_BASED_OUTPATIENT_CLINIC_OR_DEPARTMENT_OTHER): Payer: BLUE CROSS/BLUE SHIELD | Attending: Family Medicine | Admitting: Internal Medicine

## 2015-12-07 VITALS — Ht 63.0 in | Wt 240.0 lb

## 2015-12-07 DIAGNOSIS — E119 Type 2 diabetes mellitus without complications: Secondary | ICD-10-CM | POA: Insufficient documentation

## 2015-12-07 DIAGNOSIS — E669 Obesity, unspecified: Secondary | ICD-10-CM | POA: Diagnosis not present

## 2015-12-07 DIAGNOSIS — R400 Somnolence: Secondary | ICD-10-CM | POA: Insufficient documentation

## 2015-12-07 DIAGNOSIS — Z6841 Body Mass Index (BMI) 40.0 and over, adult: Secondary | ICD-10-CM | POA: Diagnosis not present

## 2015-12-07 DIAGNOSIS — G4733 Obstructive sleep apnea (adult) (pediatric): Secondary | ICD-10-CM | POA: Insufficient documentation

## 2015-12-07 DIAGNOSIS — R0683 Snoring: Secondary | ICD-10-CM | POA: Diagnosis not present

## 2015-12-07 DIAGNOSIS — I10 Essential (primary) hypertension: Secondary | ICD-10-CM | POA: Insufficient documentation

## 2015-12-07 DIAGNOSIS — Z79899 Other long term (current) drug therapy: Secondary | ICD-10-CM | POA: Diagnosis not present

## 2015-12-10 DIAGNOSIS — I10 Essential (primary) hypertension: Secondary | ICD-10-CM

## 2015-12-10 DIAGNOSIS — G4710 Hypersomnia, unspecified: Secondary | ICD-10-CM

## 2015-12-10 NOTE — Procedures (Signed)
Patient Name: Morgan Collins, Morgan Collins Date: 12/07/2015 Gender: Female D.O.B: 03-10-1956 Age (years): 60 Referring Provider: Phill Myron Height (inches): 14 Interpreting Physician: Baird Lyons MD, ABSM Weight (lbs): 240 RPSGT: Laren Everts BMI: 43 MRN: [id number] Neck Size: 16.50 CLINICAL INFORMATION Sleep Study Type: Split Night CPAP Indication for sleep study: Diabetes, Hypertension, Obesity, Snoring Epworth Sleepiness Score: 9  SLEEP STUDY TECHNIQUE As per the AASM Manual for the Scoring of Sleep and Associated Events v2.3 (April 2016) with a hypopnea requiring 4% desaturations. The channels recorded and monitored were frontal, central and occipital EEG, electrooculogram (EOG), submentalis EMG (chin), nasal and oral airflow, thoracic and abdominal wall motion, anterior tibialis EMG, snore microphone, electrocardiogram, and pulse oximetry. Continuous positive airway pressure (CPAP) was initiated when the patient met split night criteria and was titrated according to treat sleep-disordered breathing.  MEDICATIONS Medications taken by the patient :charted for review Medications administered by patient during sleep study :  TYLENOL  RESPIRATORY PARAMETERS Diagnostic Total AHI (/hr): 28.5 RDI (/hr): 38.3 OA Index (/hr): 0.9 CA Index (/hr): 0.4 REM AHI (/hr): N/A NREM AHI (/hr): 28.5 Supine AHI (/hr): N/A Non-supine AHI (/hr): 28.52 Min O2 Sat (%): 84.00 Mean O2 (%): 89.53 Time below 88% (min): 22.9   Titration Optimal Pressure (cm): 11 AHI at Optimal Pressure (/hr): 0.9 Min O2 at Optimal Pressure (%): 87.0 Supine % at Optimal (%): 100 Sleep % at Optimal (%): 91    SLEEP ARCHITECTURE The recording time for the entire night was 368.4 minutes. During a baseline period of 143.6 minutes, the patient slept for 141.1 minutes in REM and nonREM, yielding a sleep efficiency of 98.3%. Sleep onset after lights out was 0.0 minutes with a REM latency of N/A minutes. The patient spent  1.77% of the night in stage N1 sleep, 98.23% in stage N2 sleep, 0.00% in stage N3 and 0.00% in REM. During the titration period of 217.8 minutes, the patient slept for 196.5 minutes in REM and nonREM, yielding a sleep efficiency of 90.2%. Sleep onset after CPAP initiation was 4.9 minutes with a REM latency of 128.5 minutes. The patient spent 5.60% of the night in stage N1 sleep, 80.15% in stage N2 sleep, 0.00% in stage N3 and 14.25% in REM.  CARDIAC DATA The 2 lead EKG demonstrated sinus rhythm. The mean heart rate was 80.11 beats per minute. Other EKG findings include: None.  LEG MOVEMENT DATA The total Periodic Limb Movements of Sleep (PLMS) were 3. The PLMS index was 0.53 .  IMPRESSIONS - Moderate obstructive sleep apnea occurred during the diagnostic portion of the study(AHI = 28.5/hour). An optimal PAP pressure was selected for this patient ( 11 cm of water) - No significant central sleep apnea occurred during the diagnostic portion of the study (CAI = 0.4/hour). - Moderate oxygen desaturation was noted during the diagnostic portion of the study (Min O2 =84.00%). - The patient snored with Loud snoring volume during the diagnostic portion of the study. - No cardiac abnormalities were noted during this study. - Clinically significant periodic limb movements did not occur during sleep.  DIAGNOSIS - Obstructive Sleep Apnea (327.23 [G47.33 ICD-10])  RECOMMENDATIONS - Trial of CPAP therapy on 11 cm H2O with a Small size Resmed Full Face Mask AirFit F20 mask and heated humidification. - Avoid alcohol, sedatives and other CNS depressants that may worsen sleep apnea and disrupt normal sleep architecture. - Sleep hygiene should be reviewed to assess factors that may improve sleep quality. - Weight management and regular exercise should  be initiated or continued.  Deneise Lever Diplomate, American Board of Sleep Medicine  ELECTRONICALLY SIGNED ON:  12/10/2015, 1:36 PM [place] PH: [phone]   FX: [phone] [place]

## 2015-12-11 ENCOUNTER — Encounter: Payer: Self-pay | Admitting: Family Medicine

## 2016-01-05 ENCOUNTER — Telehealth: Payer: Self-pay | Admitting: Internal Medicine

## 2016-01-05 NOTE — Telephone Encounter (Signed)
Will forward to MD to advise on sleep study results. [place],CMA

## 2016-01-05 NOTE — Telephone Encounter (Signed)
Pt called because she had a sleep study done and has not received the results from the doctor yet. She really wants to know what the results are. Please call jw

## 2016-01-09 NOTE — Telephone Encounter (Signed)
Pt calling again wanting to know the results of her sleep study. Please call her back and if she doesn't answer leave her a message. Safiyya Stokes Kennon Holter, CMA

## 2016-01-09 NOTE — Telephone Encounter (Signed)
I returned Morgan Collins' call regarding her sleep study results. The call went to her voicemail and I left her a message explaining the results. Her sleep study showed moderate OSA and per the sleep study note, she was started on a CPAP machine. Advised Pt to call the sleep clinic if she does not already have the CPAP at her house. If she has any other questions, she can call our office.  Hyman Bible, MD

## 2016-02-11 ENCOUNTER — Other Ambulatory Visit: Payer: Self-pay | Admitting: Family Medicine

## 2016-02-12 ENCOUNTER — Encounter: Payer: Self-pay | Admitting: Internal Medicine

## 2016-02-12 ENCOUNTER — Ambulatory Visit (INDEPENDENT_AMBULATORY_CARE_PROVIDER_SITE_OTHER): Payer: BLUE CROSS/BLUE SHIELD | Admitting: Internal Medicine

## 2016-02-12 VITALS — BP 134/90 | HR 70 | Ht 63.0 in | Wt 240.0 lb

## 2016-02-12 DIAGNOSIS — G4733 Obstructive sleep apnea (adult) (pediatric): Secondary | ICD-10-CM | POA: Diagnosis not present

## 2016-02-12 DIAGNOSIS — IMO0001 Reserved for inherently not codable concepts without codable children: Secondary | ICD-10-CM

## 2016-02-12 DIAGNOSIS — Z0000 Encounter for general adult medical examination without abnormal findings: Secondary | ICD-10-CM | POA: Diagnosis not present

## 2016-02-12 DIAGNOSIS — E1165 Type 2 diabetes mellitus with hyperglycemia: Secondary | ICD-10-CM

## 2016-02-12 LAB — POCT GLYCOSYLATED HEMOGLOBIN (HGB A1C): Hemoglobin A1C: 6.5

## 2016-02-12 LAB — HIV ANTIBODY (ROUTINE TESTING W REFLEX): HIV 1&2 Ab, 4th Generation: NONREACTIVE

## 2016-02-12 NOTE — Progress Notes (Signed)
   [facility] Phone: [phone]  Subjective:  Type 2 DM: Last A1c in 07/2015 was 6.7%. Checking blood sugars once a day. CBGs have been running in the 100s-140s. She is taking Metformin 500mg  twice a day. No side effects. No sweatiness, no shakiness.   OSA: Had a sleep study performed on 12/07/2015, which showed moderate OSA. Recommended trial of CPAP on 11cm H2O. Pt states the sleep doctors said her PCP would order the CPAP for her. Pt states that she snores, but does not know if she stops breathing at night. She endorses daytime fatigue and frequent headaches.  ROS: See HPI for pertinent positives and negatives Past Medical History- HTN, T2DM, OSA, hypothyroidism, hx stroke, HLD Reviewed problem list.  Medications- reviewed and updated Current Outpatient Prescriptions  Medication Sig Dispense Refill  . acetaminophen (TYLENOL) 325 MG tablet Take 650 mg by mouth every 4 (four) hours as needed for moderate pain or headache.    . albuterol (PROVENTIL HFA;VENTOLIN HFA) 108 (90 Base) MCG/ACT inhaler 2 puffs every 6 hours for the first 36 hours (1.5 days). Then, 2 puffs every 6 hours as needed for wheezing/shortness of breath after that. 1 Inhaler 0  . amLODipine (NORVASC) 10 MG tablet Take 1 tablet (10 mg total) by mouth daily. 90 tablet 1  . aspirin EC 81 MG tablet Take 1 tablet (81 mg total) by mouth daily. 90 tablet 3  . atorvastatin (LIPITOR) 80 MG tablet TAKE 1 TABLET BY MOUTH EVERY DAY 90 tablet 1  . benzonatate (TESSALON) 200 MG capsule Take 1 capsule (200 mg total) by mouth 3 (three) times daily as needed for cough. 20 capsule 0  . citalopram (CELEXA) 20 MG tablet TAKE ONE TABLET BY MOUTH AT BEDTIME 90 tablet 1  . levothyroxine (SYNTHROID, LEVOTHROID) 100 MCG tablet TAKE 1 TABLET BY MOUTH EVERY DAY 90 tablet 0  . losartan (COZAAR) 100 MG tablet TAKE 1 TABLET (100 MG TOTAL) BY MOUTH DAILY. 32 tablet 1  . metFORMIN (GLUCOPHAGE) 500 MG tablet Take 1 tablet (500 mg  total) by mouth 2 (two) times daily with a meal. 180 tablet 3  . risedronate (ACTONEL) 150 MG tablet Take 1 tablet (150 mg total) by mouth every 30 (thirty) days. with water on empty stomach, nothing by mouth or lie down for next 30 minutes. 1 tablet 3  . VITAMIN D, ERGOCALCIFEROL, PO Take by mouth.     No current facility-administered medications for this visit.    Chief complaint-noted Family history reviewed for today's visit. No changes. Social history- patient is a never smoker.  Objective: There were no vitals taken for this visit. Gen: NAD, alert, cooperative with exam HEENT: NCAT, EOMI, MMM Neck: FROM, supple CV: RRR, no murmur Resp: CTABL, no wheezes, normal work of breathing Msk: No edema, warm, normal tone, moves UE/LE spontaneously Neuro: Alert and oriented, no gross deficits Skin: No rashes, no lesions Psych: Appropriate behavior  Assessment/Plan: T2DM: Well-controlled. Last A1c 6.7%. Tolerating Metformin. - A1c obtained today and was 6.5%. - Continue Metformin 500mg  bid - Referral placed to Ophthalmology for diabetic eye exam - Follow-up in 6 months  OSA: Recent sleep study 12/07/2015 showing moderate OSA. Recommend trial of CPAP on 11cm H2O.  - CPAP ordered - Follow-up as needed  Health Care Maintenance: - HIV and Hep C ordered today   Hyman Bible, MD PGY-2

## 2016-02-12 NOTE — Patient Instructions (Signed)
It was so nice to meet you!  I have ordered a CPAP machine. The company that distributes these machines will call you with further instructions.  I have also checked an HIV and Hepatitis C lab today. Every adult needs these checked once in their life. I will call you with the results.  I have referred you to Ophthalmology. You should hear from our office in 2-4 weeks to schedule this appointment.  We will see you back in 6 months.  -Dr. Brett Albino

## 2016-02-12 NOTE — Assessment & Plan Note (Signed)
Recent sleep study 12/07/2015 showing moderate OSA. Recommend trial of CPAP on 11cm H2O.  - CPAP ordered - Follow-up as needed

## 2016-02-12 NOTE — Assessment & Plan Note (Addendum)
Well-controlled. Last A1c 6.7%. Tolerating Metformin. - A1c obtained today and was 6.5%. - Continue Metformin 500mg  bid - Referral placed to Ophthalmology for diabetic eye exam - Follow-up in 6 months

## 2016-02-12 NOTE — Assessment & Plan Note (Signed)
-   HIV and Hep C ordered today

## 2016-02-13 ENCOUNTER — Encounter: Payer: Self-pay | Admitting: *Deleted

## 2016-02-13 LAB — HEPATITIS C ANTIBODY: HCV Ab: NEGATIVE

## 2016-02-13 NOTE — Progress Notes (Signed)
Letter mailed to patient. Rossana Molchan,CMA  

## 2016-02-20 ENCOUNTER — Other Ambulatory Visit: Payer: Self-pay | Admitting: Family Medicine

## 2016-02-23 ENCOUNTER — Telehealth: Payer: Self-pay | Admitting: Internal Medicine

## 2016-02-23 NOTE — Telephone Encounter (Signed)
Pt is calling to check the status on her CPAP machine. She said that the doctor was going to order this at her last visit. Please update the patient on the progress. jw

## 2016-02-23 NOTE — Telephone Encounter (Signed)
Spoke with patient I informed that I wasn't able to reach anyone with [facility] but that I refaxed her order and information to them.  They should be contacting her in the next few days. Vahe Pienta,CMA

## 2016-02-26 ENCOUNTER — Other Ambulatory Visit (INDEPENDENT_AMBULATORY_CARE_PROVIDER_SITE_OTHER): Payer: BLUE CROSS/BLUE SHIELD

## 2016-02-26 DIAGNOSIS — E210 Primary hyperparathyroidism: Secondary | ICD-10-CM

## 2016-02-26 DIAGNOSIS — E559 Vitamin D deficiency, unspecified: Secondary | ICD-10-CM | POA: Diagnosis not present

## 2016-02-26 LAB — BASIC METABOLIC PANEL
BUN: 15 mg/dL (ref 6–23)
CO2: 27 mEq/L (ref 19–32)
Calcium: 10.5 mg/dL (ref 8.4–10.5)
Chloride: 105 mEq/L (ref 96–112)
Creatinine, Ser: 0.71 mg/dL (ref 0.40–1.20)
GFR: 107.81 mL/min (ref >60.00)
Glucose, Bld: 94 mg/dL (ref 70–99)
Potassium: 4.5 mEq/L (ref 3.5–5.1)
Sodium: 138 mEq/L (ref 135–145)

## 2016-02-26 LAB — VITAMIN D 25 HYDROXY (VIT D DEFICIENCY, FRACTURES): VITD: 30.27 ng/mL (ref 30.00–100.00)

## 2016-02-29 ENCOUNTER — Encounter: Payer: Self-pay | Admitting: Endocrinology

## 2016-02-29 ENCOUNTER — Ambulatory Visit (INDEPENDENT_AMBULATORY_CARE_PROVIDER_SITE_OTHER): Payer: Commercial Managed Care - HMO | Admitting: Endocrinology

## 2016-02-29 VITALS — BP 132/88 | HR 82 | Temp 98.0°F | Resp 14 | Ht 63.0 in | Wt 241.8 lb

## 2016-02-29 DIAGNOSIS — M810 Age-related osteoporosis without current pathological fracture: Secondary | ICD-10-CM | POA: Diagnosis not present

## 2016-02-29 DIAGNOSIS — E210 Primary hyperparathyroidism: Secondary | ICD-10-CM | POA: Diagnosis not present

## 2016-02-29 DIAGNOSIS — E559 Vitamin D deficiency, unspecified: Secondary | ICD-10-CM

## 2016-02-29 MED ORDER — RISEDRONATE SODIUM 150 MG PO TABS
150.0000 mg | ORAL_TABLET | ORAL | 5 refills | Status: DC
Start: 2016-02-29 — End: 2016-12-05

## 2016-02-29 NOTE — Progress Notes (Signed)
Patient ID: Morgan Collins, female   DOB: 1955-07-23, 60 y.o.   MRN: [id number]           Chief complaint: High calcium and osteoporosis  History of Present Illness:    She was initially evaluated for hyperparathyroidism in 8/16   Review of records show that she has had a high calcium since about 2014, she had upper normal level in 2013 and previous records are not available.  Her calcium levels have previously ranged between 10.8-11.6 although relatively higher since 11/16 Also her PCP has stopped her HCTZ in 12/16  She continues to be asymptomatic with no unusual weight loss, nausea or fatigue and no history of fractures  Her calcium is now upper normal at 10.5  Lab Results  Component Value Date   CALCIUM 10.5 02/26/2016   CALCIUM 11.0 (H) 08/14/2015   CALCIUM 11.4 (H) 05/19/2015   CALCIUM 11.5 (H) 04/21/2015   CALCIUM 10.7 (H) 03/23/2015   CALCIUM 11.0 (H) 02/09/2015   CALCIUM 10.8 (H) 10/26/2014   CALCIUM 10.0 10/11/2014   CALCIUM 11.1 (H) 07/26/2014     Prior serologic and radiologic studies have included:  Lab Results  Component Value Date   PTH 123 (H) 10/11/2014   CALCIUM 10.5 02/26/2016   CAION 1.57 (H) 10/26/2014   PHOS 2.3 02/09/2015      25 (OH) Vitamin D level was 26 And was started on vitamin D3, 2000 units daily However she started the 2000 unit dose only very recently, previously on 1000 units   Lab Results  Component Value Date   VD25OH 30.27 02/26/2016      OSTEOPOROSIS:  She had a bone density checked because of her history of hyperparathyroidism   Bone mineral density done in 11/2014 showed T score of -3.5 at the spine but only -1.4 at the femur She apparently had oophorectomy in her 54s and her second ovary was removed at about the age of 41 because of a large cyst She has never been given hormone supplements No history of height loss  She was started on Actonel 150 mg monthly on her initial consultation in 8/16  However She  tends to be irregular with the medication and appears that she may have difficulty affording it at times Recently she did not take the tablets since 6/17       Medication List       Accurate as of 02/29/16  5:04 PM. Always use your most recent med list.          acetaminophen 325 MG tablet Commonly known as:  TYLENOL Take 650 mg by mouth every 4 (four) hours as needed for moderate pain or headache.   albuterol 108 (90 Base) MCG/ACT inhaler Commonly known as:  PROVENTIL HFA;VENTOLIN HFA 2 puffs every 6 hours for the first 36 hours (1.5 days). Then, 2 puffs every 6 hours as needed for wheezing/shortness of breath after that.   amLODipine 10 MG tablet Commonly known as:  NORVASC Take 1 tablet (10 mg total) by mouth daily.   aspirin EC 81 MG tablet Take 1 tablet (81 mg total) by mouth daily.   atorvastatin 80 MG tablet Commonly known as:  LIPITOR TAKE 1 TABLET BY MOUTH EVERY DAY   citalopram 20 MG tablet Commonly known as:  CELEXA TAKE ONE TABLET BY MOUTH AT BEDTIME   levothyroxine 100 MCG tablet Commonly known as:  SYNTHROID, LEVOTHROID TAKE 1 TABLET BY MOUTH EVERY DAY   losartan 100 MG tablet Commonly known  as:  COZAAR TAKE 1 TABLET (100 MG TOTAL) BY MOUTH DAILY.   metFORMIN 500 MG tablet Commonly known as:  GLUCOPHAGE Take 1 tablet (500 mg total) by mouth 2 (two) times daily with a meal.   risedronate 150 MG tablet Commonly known as:  ACTONEL Take 1 tablet (150 mg total) by mouth every 30 (thirty) days. with water on empty stomach, nothing by mouth or lie down for next 30 minutes.   VITAMIN D (ERGOCALCIFEROL) PO Take by mouth.       Allergies: No Known Allergies  Past Medical History:  Diagnosis Date  . Depressed   . High cholesterol   . Hypertension   . Hypothyroidism   . Stroke [facility])    1 yrs ago -slower ,but not a problem  . Thyroid disease     Past Surgical History:  Procedure Laterality Date  . ABDOMINAL HYSTERECTOMY    . APPENDECTOMY      . c sections    . COLONOSCOPY WITH PROPOFOL N/A 12/30/2014   Procedure: COLONOSCOPY WITH PROPOFOL;  Surgeon: Carol Ada, MD;  Location: [facility];  Service: Endoscopy;  Laterality: N/A;  . TRACHEOSTOMY     past history of early 20's- closed now.    No family history on file.  Social History:  reports that she has never smoked. She does not have any smokeless tobacco history on file. She reports that she does not drink alcohol or use drugs.  ROS    Hypothyroid since ? 2006 , appears to have received radioactive iodine treatment for hyperthyroidism at that time She does not complain of any unusual fatigue and her thyroid level Has not been checked recently   Lab Results  Component Value Date   TSH 1.015 04/21/2015   TSH 2.053 10/26/2014   TSH 18.807 (H) 08/18/2013   FREET4 1.17 03/03/2014     She is on metformin  for mild diabetes with A1c 6.5, Followed by PCP  Lab Results  Component Value Date   HGBA1C 6.5 02/12/2016   HGBA1C 6.7 07/26/2015   HGBA1C 7.6 03/23/2015   Lab Results  Component Value Date   LDLCALC 127 (H) 07/26/2014   CREATININE 0.71 02/26/2016       EXAM:  BP 132/88   Pulse 82   Temp 98 F (36.7 C)   Resp 14   Ht 5\' 3"  (1.6 m)   Wt 241 lb 12.8 oz (109.7 kg)   SpO2 93%   BMI 42.83 kg/m     Assessment/Plan:   HYPERCALCEMIA: This appears to be long-standing , secondary to hyperparathyroidism Most recent calcium is 10.5, previously 11.0 and appears to be stable or improved Again she is asymptomatic  Has osteoporosis but not clear this is from premature menopause or hyperparathyroidism Currently she is supposed to be taking Actonel but she is very regular with this, partly because of cost She was offered the choice of taking this or Reclast infusion but she will not consider an injectable drugs  Also on vitamin D for a relatively low level, has only increased her dose to 2000 units just recently Will have her continue the same dose and  monitor periodically    Patient Instructions  Stay on 2000 U Vitamin D3  Restart Actonel monthly    Morgan Collins 02/29/2016, 5:04 PM

## 2016-02-29 NOTE — Patient Instructions (Addendum)
Stay on [place] monthly

## 2016-03-25 ENCOUNTER — Telehealth: Payer: Self-pay | Admitting: Internal Medicine

## 2016-03-25 NOTE — Telephone Encounter (Signed)
Per Roselyn Reef at [facility], pt no-showed appt in September. I have placed the [facility] referral for Dr. Brooke Dare. Patient will need to call their office directly to re-schedule her missed appt. Phone: [phone]

## 2016-03-25 NOTE — Telephone Encounter (Signed)
Pt is calling because she needs a [employer] referral to be seen at [facility]. She doesn't have the [place]. Can we get the new referral sent to them. jw

## 2016-03-27 ENCOUNTER — Other Ambulatory Visit: Payer: Self-pay | Admitting: Family Medicine

## 2016-04-30 ENCOUNTER — Other Ambulatory Visit: Payer: Self-pay | Admitting: *Deleted

## 2016-04-30 MED ORDER — LOSARTAN POTASSIUM 100 MG PO TABS: ORAL_TABLET | ORAL | 0 refills | Status: DC

## 2016-04-30 NOTE — Telephone Encounter (Signed)
Refill request for 90 day supply.  Morgan Collins L, RN  

## 2016-05-15 ENCOUNTER — Ambulatory Visit (INDEPENDENT_AMBULATORY_CARE_PROVIDER_SITE_OTHER): Payer: Commercial Managed Care - HMO | Admitting: Internal Medicine

## 2016-05-15 ENCOUNTER — Encounter: Payer: Self-pay | Admitting: Internal Medicine

## 2016-05-15 VITALS — BP 131/88 | HR 99 | Temp 98.3°F | Wt 242.0 lb

## 2016-05-15 DIAGNOSIS — Z23 Encounter for immunization: Secondary | ICD-10-CM | POA: Diagnosis not present

## 2016-05-15 DIAGNOSIS — E1165 Type 2 diabetes mellitus with hyperglycemia: Secondary | ICD-10-CM

## 2016-05-15 DIAGNOSIS — R05 Cough: Secondary | ICD-10-CM | POA: Diagnosis not present

## 2016-05-15 DIAGNOSIS — Z0000 Encounter for general adult medical examination without abnormal findings: Secondary | ICD-10-CM

## 2016-05-15 DIAGNOSIS — IMO0001 Reserved for inherently not codable concepts without codable children: Secondary | ICD-10-CM

## 2016-05-15 DIAGNOSIS — R059 Cough, unspecified: Secondary | ICD-10-CM | POA: Insufficient documentation

## 2016-05-15 LAB — POCT GLYCOSYLATED HEMOGLOBIN (HGB A1C): Hemoglobin A1C: 6.6

## 2016-05-15 NOTE — Patient Instructions (Addendum)
It was so nice to see you!  Your A1c and your blood pressures looked really good today. If you get to the point that you feel like your cough is starting to bother you more, please give me a call.   We will see you back in 3 months!  -Dr. Brett Albino   Chronic Bronchitis Chronic bronchitis is a lasting inflammation of the bronchial tubes, which are the tubes that carry air into your lungs. This is inflammation that occurs:  On most days of the week.  For at least three months at a time.  Over a period of two years in a row. When the bronchial tubes are inflamed, they start to produce mucus. The inflammation and buildup of mucus make it more difficult to breathe. Chronic bronchitis is usually a permanent problem and is one type of chronic obstructive pulmonary disease (COPD). People with chronic bronchitis are at greater risk for getting repeated colds, or respiratory infections. What are the causes? Chronic bronchitis most often occurs in people who have:  Long-standing, severe asthma.  A history of smoking.  Asthma and who also smoke. What are the signs or symptoms? Chronic bronchitis may cause the following:  A cough that brings up mucus (productive cough).  Shortness of breath.  Early morning headache.  Wheezing.  Chest discomfort.  Recurring respiratory infections. How is this diagnosed? Your health care provider may confirm the diagnosis by:  Taking your medical history.  Performing a physical exam.  Taking a chest X-ray.  Performing pulmonary function tests. How is this treated? Treatment involves controlling symptoms with medicines, oxygen therapy, or making lifestyle changes, such as exercising and eating a healthy, well-balanced diet. Medicines could include:  Inhalers to improve air flow in and out of your lungs.  Antibiotics to treat bacterial infections, such as pneumonia, sinus infections, and acute bronchitis. As a preventative measure, your health care  provider may recommend routine vaccinations for influenza and pneumonia. This is to prevent infection and hospitalization since you may be more at risk for these types of infections. Follow these instructions at home:  Take medicines only as directed by your health care provider.  If you smoke cigarettes, chew tobacco, or use electronic cigarettes, quit. If you need help quitting, ask your health care provider.  Avoid pollen, dust, animal dander, molds, smoke, and other things that cause shortness of breath or wheezing attacks.  Talk to your health care provider about possible exercise routines. Regular exercise is very important to help you feel better.  If you are prescribed oxygen use at home follow these guidelines:  Never smoke while using oxygen. Oxygen does not burn or explode, but flammable materials will burn faster in the presence of oxygen.  Keep a Data processing manager close by. Let your fire department know that you have oxygen in your home.  Warn visitors not to smoke near you when you are using oxygen. Put up "no smoking" signs in your home where you most often use the oxygen.  Regularly test your smoke detectors at home to make sure they work. If you receive care in your home from a nurse or other health care provider, he or she may also check to make sure your smoke detectors work.  Ask your health care provider whether you would benefit from a pulmonary rehabilitation program.  Do not wait to get medical care if you have any concerning symptoms. Delays could cause permanent injury and may be life threatening. Contact a health care provider if:  You have increased coughing or shortness of breath or both.  You have muscle aches.  You have chest pain.  Your mucus gets thicker.  Your mucus changes from clear or white to yellow, green, gray, or bloody. Get help right away if:  Your usual medicines do not stop your wheezing.  You have increased difficulty  breathing.  You have any problems with the medicine you are taking, such as a rash, itching, swelling, or trouble breathing. This information is not intended to replace advice given to you by your health care provider. Make sure you discuss any questions you have with your health care provider. Document Released: 03/21/2006 Document Revised: 10/12/2015 Document Reviewed: 07/12/2013 Elsevier Interactive Patient Education  2017 [employer].

## 2016-05-15 NOTE — Assessment & Plan Note (Signed)
Received flu shot today. 

## 2016-05-15 NOTE — Progress Notes (Signed)
   [facility] Phone: [phone]  Subjective:  Cough: She has had cough for the last month. Cough is not productive. She states she gets bronchitis every year. She has been on Azithromycin, Prednisone, and Albuterol in the past. These have all helped a little bit. She is planning on getting some Mucinex to see if this will help. Pt states that she does not want any other medication for her cough at this time. She just wants to understand why this keeps happening each year. She denies any shortness of breath, chest pain, fevers, chills, or lower extremity edema.  DM: Takes Metformin 500mg  bid. Has Ophtho appointment on December 6th. Doesn't check CBGs. No shakiness, sweatiness, or feeling like her blood sugars are low. No medication side effects.  ROS: See HPI for pertinent positives and negatives  Past Medical History- HTN, hx stroke, OSA, hypothyroidism, T2DM, morbid obesity, depression.  Family history reviewed for today's visit. No changes.  Social history- patient is a never smoker.  Objective: BP 131/88   Pulse 99   Temp 98.3 F (36.8 C) (Oral)   Wt 242 lb (109.8 kg)   SpO2 94%   BMI 42.87 kg/m  Gen: NAD, alert, cooperative with exam HEENT: NCAT, EOMI, MMM Neck: FROM CV: RRR, no murmur Resp: CTABL, no wheezes, normal work of breathing  Assessment/Plan: Cough: Pt has a history of bronchitis, starting around this time every year. Think she likely has chronic bronchitis. Lung exam is completely normal today. She does not want any medications at this time. She just wants to know why this keeps happening to her. - Continue to monitor - Try some OTC Mucinex - Pt will call or come back to the clinic if her cough worsens   T2DM:  Well-controlled. Last A1c 6.5%. Repeat A1c today was 6.6%.  - Continue Metformin 500mg  bid - Pt has ophthalmology appointment scheduled for 05/22/16. - Follow up in 3 months  Health Care Maintenance: - Received flu shot  today   Hyman Bible, MD PGY-2

## 2016-05-15 NOTE — Assessment & Plan Note (Signed)
Pt has a history of bronchitis, starting around this time every year. Think she likely has chronic bronchitis. Lung exam is completely normal today. She does not want any medications at this time. She just wants to know why this keeps happening to her. - Continue to monitor - Try some OTC Mucinex - Pt will call or come back to the clinic if her cough worsens

## 2016-05-15 NOTE — Assessment & Plan Note (Signed)
Well-controlled. Last A1c 6.5%. Repeat A1c today was 6.6%.  - Continue Metformin 500mg  bid - Pt has ophthalmology appointment scheduled for 05/22/16. - Follow up in 3 months

## 2016-05-22 ENCOUNTER — Telehealth: Payer: Self-pay | Admitting: Internal Medicine

## 2016-05-22 DIAGNOSIS — H25813 Combined forms of age-related cataract, bilateral: Secondary | ICD-10-CM | POA: Diagnosis not present

## 2016-05-22 DIAGNOSIS — E119 Type 2 diabetes mellitus without complications: Secondary | ICD-10-CM | POA: Diagnosis not present

## 2016-05-22 LAB — HM DIABETES EYE EXAM

## 2016-05-22 NOTE — Telephone Encounter (Signed)
Melissa from [employer] needs a [employer] authorization number ASAP. Pt's appointment is right now. Please advise. Thanks! ep

## 2016-05-22 NOTE — Telephone Encounter (Signed)
Completed, auth # [id number]

## 2016-05-30 ENCOUNTER — Other Ambulatory Visit: Payer: Self-pay | Admitting: *Deleted

## 2016-05-30 DIAGNOSIS — R7303 Prediabetes: Secondary | ICD-10-CM

## 2016-05-30 DIAGNOSIS — I10 Essential (primary) hypertension: Secondary | ICD-10-CM

## 2016-05-30 MED ORDER — LEVOTHYROXINE SODIUM 100 MCG PO TABS: 100.0000 ug | ORAL_TABLET | Freq: Every day | ORAL | 0 refills | Status: DC

## 2016-05-30 MED ORDER — ATORVASTATIN CALCIUM 80 MG PO TABS: 80.0000 mg | ORAL_TABLET | Freq: Every day | ORAL | 1 refills | Status: DC

## 2016-05-30 MED ORDER — CITALOPRAM HYDROBROMIDE 20 MG PO TABS: 20.0000 mg | ORAL_TABLET | Freq: Every day | ORAL | 1 refills | Status: DC

## 2016-05-30 MED ORDER — METFORMIN HCL 500 MG PO TABS: 500.0000 mg | ORAL_TABLET | Freq: Two times a day (BID) | ORAL | 3 refills | Status: DC

## 2016-05-30 MED ORDER — AMLODIPINE BESYLATE 10 MG PO TABS: 10.0000 mg | ORAL_TABLET | Freq: Every day | ORAL | 1 refills | Status: DC

## 2016-05-30 MED ORDER — LOSARTAN POTASSIUM 100 MG PO TABS: ORAL_TABLET | ORAL | 0 refills | Status: DC

## 2016-08-23 ENCOUNTER — Other Ambulatory Visit (INDEPENDENT_AMBULATORY_CARE_PROVIDER_SITE_OTHER): Payer: Commercial Managed Care - HMO

## 2016-08-23 DIAGNOSIS — E210 Primary hyperparathyroidism: Secondary | ICD-10-CM | POA: Diagnosis not present

## 2016-08-23 LAB — BASIC METABOLIC PANEL
BUN: 24 mg/dL — ABNORMAL HIGH (ref 6–23)
CO2: 28 mEq/L (ref 19–32)
Calcium: 11.5 mg/dL — ABNORMAL HIGH (ref 8.4–10.5)
Chloride: 102 mEq/L (ref 96–112)
Creatinine, Ser: 0.89 mg/dL (ref 0.40–1.20)
GFR: 82.93 mL/min (ref >60.00)
Glucose, Bld: 111 mg/dL — ABNORMAL HIGH (ref 70–99)
Potassium: 4.3 mEq/L (ref 3.5–5.1)
Sodium: 137 mEq/L (ref 135–145)

## 2016-08-28 ENCOUNTER — Encounter: Payer: Self-pay | Admitting: Endocrinology

## 2016-08-28 ENCOUNTER — Ambulatory Visit (INDEPENDENT_AMBULATORY_CARE_PROVIDER_SITE_OTHER): Payer: Commercial Managed Care - HMO | Admitting: Endocrinology

## 2016-08-28 VITALS — BP 128/90 | HR 87 | Ht 63.0 in | Wt 242.0 lb

## 2016-08-28 DIAGNOSIS — E039 Hypothyroidism, unspecified: Secondary | ICD-10-CM

## 2016-08-28 DIAGNOSIS — E559 Vitamin D deficiency, unspecified: Secondary | ICD-10-CM

## 2016-08-28 DIAGNOSIS — E210 Primary hyperparathyroidism: Secondary | ICD-10-CM | POA: Diagnosis not present

## 2016-08-28 MED ORDER — ALENDRONATE SODIUM 70 MG PO TABS: 70.0000 mg | ORAL_TABLET | ORAL | 1 refills | Status: DC

## 2016-08-28 NOTE — Progress Notes (Signed)
Patient ID: Morgan Collins, female   DOB: [birth year], 61 y.o.   MRN: [id number]           Chief complaint: High calcium and osteoporosis   History of Present Illness:    She was initially evaluated for hyperparathyroidism in 8/16   Review of records show that she has had a high calcium since about 2014, she had upper normal level in 2013 and previous records are not available.  Her calcium levels have previously ranged between 10.8-11.6 although relatively higher since 11/16 Also her PCP has stopped her HCTZ in 12/16  She continues to be asymptomatic with no unusual weight loss, nausea or fatigue and no history of fractures  Her calcium is now relatively higher at 11.5, about 6 months ago was 10.5; however has been 11.5 in the past also   Lab Results  Component Value Date   CALCIUM 11.5 (H) 08/23/2016   CALCIUM 10.5 02/26/2016   CALCIUM 11.0 (H) 08/14/2015   CALCIUM 11.4 (H) 05/19/2015   CALCIUM 11.5 (H) 04/21/2015   CALCIUM 10.7 (H) 03/23/2015   CALCIUM 11.0 (H) 02/09/2015   CALCIUM 10.8 (H) 10/26/2014   CALCIUM 10.0 10/11/2014     Prior Labs:  Lab Results  Component Value Date   PTH 123 (H) 10/11/2014   CALCIUM 11.5 (H) 08/23/2016   CAION 1.57 (H) 10/26/2014   PHOS 2.3 02/09/2015      25 (OH) Vitamin D level was 26 And was started on vitamin D3, 2000 units daily   Lab Results  Component Value Date   VD25OH 30.27 02/26/2016      OSTEOPOROSIS:  She had a bone density checked because of her history of hyperparathyroidism   Bone mineral density done in 11/2014 showed T score of -3.5 at the spine but only -1.4 at the femur She apparently had oophorectomy in her 38s and her second ovary was removed at about the age of 86 because of a large cyst She has never been given hormone supplements No history of height loss  She was started on Actonel 150 mg monthly on her initial consultation in 8/16  However She is again not taking this regularly because of  cost and has not taken this for the last 2 months She was offered other options such as Reclast and Prolia but she refuses to take any injectable drugs      Allergies as of 08/28/2016   No Known Allergies     Medication List       Accurate as of 08/28/16  9:26 PM. Always use your most recent med list.          acetaminophen 325 MG tablet Commonly known as:  TYLENOL Take 650 mg by mouth every 4 (four) hours as needed for moderate pain or headache.   alendronate 70 MG tablet Commonly known as:  FOSAMAX Take 1 tablet (70 mg total) by mouth every 7 (seven) days. Take with a full glass of water on an empty stomach.   amLODipine 10 MG tablet Commonly known as:  NORVASC Take 1 tablet (10 mg total) by mouth daily.   aspirin EC 81 MG tablet Take 1 tablet (81 mg total) by mouth daily.   atorvastatin 80 MG tablet Commonly known as:  LIPITOR Take 1 tablet (80 mg total) by mouth daily.   levothyroxine 100 MCG tablet Commonly known as:  SYNTHROID, LEVOTHROID Take 1 tablet (100 mcg total) by mouth daily.   losartan 100 MG tablet Commonly known as:  COZAAR TAKE 1 TABLET (100 MG TOTAL) BY MOUTH DAILY.   metFORMIN 500 MG tablet Commonly known as:  GLUCOPHAGE Take 1 tablet (500 mg total) by mouth 2 (two) times daily with a meal.   risedronate 150 MG tablet Commonly known as:  ACTONEL Take 1 tablet (150 mg total) by mouth every 30 (thirty) days. with water on empty stomach, nothing by mouth or lie down for next 30 minutes.   VITAMIN D (ERGOCALCIFEROL) PO Take by mouth.       Allergies: No Known Allergies  Past Medical History:  Diagnosis Date  . Depressed   . High cholesterol   . Hypertension   . Hypothyroidism   . Stroke [facility])    1 yrs ago -slower ,but not a problem  . Thyroid disease     Past Surgical History:  Procedure Laterality Date  . ABDOMINAL HYSTERECTOMY    . APPENDECTOMY    . c sections    . COLONOSCOPY WITH PROPOFOL N/A 12/30/2014   Procedure:  COLONOSCOPY WITH PROPOFOL;  Surgeon: Carol Ada, MD;  Location: [facility];  Service: Endoscopy;  Laterality: N/A;  . TRACHEOSTOMY     past history of early 20's- closed now.    No family history on file.  Social History:  reports that she has never smoked. She has never used smokeless tobacco. She reports that she does not drink alcohol or use drugs.  ROS    Hypothyroid since ? 2006 , appears to have received radioactive iodine treatment for hyperthyroidism at that time No recent thyroid levels available, is due to see her PCP  Lab Results  Component Value Date   TSH 1.015 04/21/2015   TSH 2.053 10/26/2014   TSH 18.807 (H) 08/18/2013   FREET4 1.17 03/03/2014     She is on metformin  for mild diabetes, Followed by PCP  Lab Results  Component Value Date   HGBA1C 6.6 05/15/2016   HGBA1C 6.5 02/12/2016   HGBA1C 6.7 07/26/2015   Lab Results  Component Value Date   LDLCALC 127 (H) 07/26/2014   CREATININE 0.89 08/23/2016    OBESITY: The patient lost about a weight loss medication    EXAM:  BP 128/90   Pulse 87   Ht 5\' 3"  (1.6 m)   Wt 242 lb (109.8 kg)   SpO2 97%   BMI 42.87 kg/m     Assessment/Plan:   HYPERCALCEMIA: This appears to be long-standing , secondary to hyperparathyroidism Most recent calcium is  11.5 and is relatively higher, however has been fluctuating in the past Again she is asymptomatic  OSTEOPOROSIS with T score - 3.5 at the spine Risk factors for this are premature menopause or hyperparathyroidism  Currently she is not wanting to take Actonel and has been irregular with this because of high out-of-pocket expense. She again refuses any injectable treatment Will try to get her on Fosamax weekly and this may be less expensive  She will need follow-up bone density in 6/18 Again discussed importance of continued treatment and if her osteoporosis  is getting worse may consider parathyroid surgery  OBESITY/diabetes: Discussed that she may  be a candidate for an SGLT 2 drug which will have both and she will discuss this with her PCP  Also needs follow-up with thyroid levels from PCP    There are no Patient Instructions on file for this visit.   Morgan Collins 08/28/2016, 9:26 PM

## 2016-10-01 ENCOUNTER — Other Ambulatory Visit: Payer: Self-pay | Admitting: Family Medicine

## 2016-10-01 DIAGNOSIS — E119 Type 2 diabetes mellitus without complications: Secondary | ICD-10-CM | POA: Diagnosis not present

## 2016-10-01 DIAGNOSIS — I10 Essential (primary) hypertension: Secondary | ICD-10-CM

## 2016-10-01 DIAGNOSIS — R7303 Prediabetes: Secondary | ICD-10-CM

## 2016-10-18 ENCOUNTER — Ambulatory Visit: Payer: Medicare PPO | Admitting: Internal Medicine

## 2016-12-02 ENCOUNTER — Other Ambulatory Visit (INDEPENDENT_AMBULATORY_CARE_PROVIDER_SITE_OTHER): Payer: Medicare PPO

## 2016-12-02 DIAGNOSIS — E210 Primary hyperparathyroidism: Secondary | ICD-10-CM | POA: Diagnosis not present

## 2016-12-02 DIAGNOSIS — E559 Vitamin D deficiency, unspecified: Secondary | ICD-10-CM | POA: Diagnosis not present

## 2016-12-02 DIAGNOSIS — E039 Hypothyroidism, unspecified: Secondary | ICD-10-CM | POA: Diagnosis not present

## 2016-12-02 LAB — RENAL FUNCTION PANEL
Albumin: 4.1 g/dL (ref 3.5–5.2)
BUN: 19 mg/dL (ref 6–23)
CO2: 28 mEq/L (ref 19–32)
Calcium: 11.6 mg/dL — ABNORMAL HIGH (ref 8.4–10.5)
Chloride: 102 mEq/L (ref 96–112)
Creatinine, Ser: 0.84 mg/dL (ref 0.40–1.20)
GFR: 88.57 mL/min (ref >60.00)
Glucose, Bld: 189 mg/dL — ABNORMAL HIGH (ref 70–99)
Phosphorus: 2.8 mg/dL (ref 2.3–4.6)
Potassium: 4.5 mEq/L (ref 3.5–5.1)
Sodium: 136 mEq/L (ref 135–145)

## 2016-12-02 LAB — VITAMIN D 25 HYDROXY (VIT D DEFICIENCY, FRACTURES): VITD: 32.88 ng/mL (ref 30.00–100.00)

## 2016-12-02 LAB — TSH: TSH: 18.52 u[IU]/mL — ABNORMAL HIGH (ref 0.35–4.50)

## 2016-12-02 LAB — T4, FREE: Free T4: 0.39 ng/dL — ABNORMAL LOW (ref 0.60–1.60)

## 2016-12-05 ENCOUNTER — Encounter: Payer: Self-pay | Admitting: Endocrinology

## 2016-12-05 ENCOUNTER — Ambulatory Visit (INDEPENDENT_AMBULATORY_CARE_PROVIDER_SITE_OTHER): Payer: Medicare PPO | Admitting: Endocrinology

## 2016-12-05 VITALS — BP 108/74 | HR 98 | Ht 63.0 in | Wt 245.0 lb

## 2016-12-05 DIAGNOSIS — E210 Primary hyperparathyroidism: Secondary | ICD-10-CM

## 2016-12-05 NOTE — Patient Instructions (Signed)
Take thyroid pill every am

## 2016-12-05 NOTE — Progress Notes (Signed)
Patient ID: Morgan Collins, female   DOB: Oct 11, 1955, 61 y.o.   MRN: [id number]           Chief complaint: High calcium and osteoporosis   History of Present Illness:    She was initially evaluated for hyperparathyroidism in 8/16   Review of records show that she has had a high calcium since about 2014, she had upper normal level in 2013 and previous records are not available.  Her calcium levels have previously ranged between 10.8-11.6 although relatively higher since 11/16 Also her PCP has stopped her HCTZ in 12/16  She continues to be asymptomatic with no unusual weight loss or fatigue and no history of fractures  Her calcium is now persistently high at 11.6 and previously was 11.5 compared to 10.5 in 2017   Lab Results  Component Value Date   CALCIUM 11.6 (H) 12/02/2016   CALCIUM 11.5 (H) 08/23/2016   CALCIUM 10.5 02/26/2016   CALCIUM 11.0 (H) 08/14/2015   CALCIUM 11.4 (H) 05/19/2015   CALCIUM 11.5 (H) 04/21/2015   CALCIUM 10.7 (H) 03/23/2015   CALCIUM 11.0 (H) 02/09/2015   CALCIUM 10.8 (H) 10/26/2014     Prior Labs:  Lab Results  Component Value Date   PTH 123 (H) 10/11/2014   CALCIUM 11.6 (H) 12/02/2016   CAION 1.57 (H) 10/26/2014   PHOS 2.8 12/02/2016      25 (OH) Vitamin D level was 26 And was started on vitamin D3, 2000 units daily   Lab Results  Component Value Date   VD25OH 32.88 12/02/2016      OSTEOPOROSIS:  She had a bone density checked because of her history of hyperparathyroidism   Bone mineral density done in 11/2014 showed T score of -3.5 at the spine but only -1.4 at the femur She apparently had oophorectomy in her 49s and her second ovary was removed at about the age of 21 because of a large cyst She has never been given hormone supplements No history of height loss  She was started on Actonel 150 mg monthly on her initial consultation in 8/16 Because of the expense this was stopped and Fosamax started in March which she is  taking weekly  She was offered other options such as Reclast and Prolia but she refuses to take any injectable drugs      Allergies as of 12/05/2016   No Known Allergies     Medication List       Accurate as of 12/05/16 11:59 PM. Always use your most recent med list.          acetaminophen 325 MG tablet Commonly known as:  TYLENOL Take 650 mg by mouth every 4 (four) hours as needed for moderate pain or headache.   alendronate 70 MG tablet Commonly known as:  FOSAMAX Take 1 tablet (70 mg total) by mouth every 7 (seven) days. Take with a full glass of water on an empty stomach.   amLODipine 10 MG tablet Commonly known as:  NORVASC TAKE 1 TABLET EVERY DAY   aspirin EC 81 MG tablet Take 1 tablet (81 mg total) by mouth daily.   atorvastatin 80 MG tablet Commonly known as:  LIPITOR TAKE 1 TABLET EVERY DAY   levothyroxine 100 MCG tablet Commonly known as:  SYNTHROID, LEVOTHROID TAKE 1 TABLET EVERY DAY   losartan 100 MG tablet Commonly known as:  COZAAR TAKE 1 TABLET EVERY DAY   metFORMIN 500 MG tablet Commonly known as:  GLUCOPHAGE TAKE 1 TABLET TWICE  DAILY WITH A MEAL   vitamin B-12 100 MCG tablet Commonly known as:  CYANOCOBALAMIN Take 100 mcg by mouth daily. Takes 4 times daily   VITAMIN D (ERGOCALCIFEROL) PO Take by mouth.       Allergies: No Known Allergies  Past Medical History:  Diagnosis Date  . Depressed   . High cholesterol   . Hypertension   . Hypothyroidism   . Stroke [facility])    1 yrs ago -slower ,but not a problem  . Thyroid disease     Past Surgical History:  Procedure Laterality Date  . ABDOMINAL HYSTERECTOMY    . APPENDECTOMY    . c sections    . COLONOSCOPY WITH PROPOFOL N/A 12/30/2014   Procedure: COLONOSCOPY WITH PROPOFOL;  Surgeon: Carol Ada, MD;  Location: [facility];  Service: Endoscopy;  Laterality: N/A;  . TRACHEOSTOMY     past history of early 20's- closed now.    No family history on file.  Social History:   reports that she has never smoked. She has never used smokeless tobacco. She reports that she does not drink alcohol or use drugs.  ROS    Hypothyroid since ? 2006 , appears to have received radioactive iodine treatment for hyperthyroidism at that time   Lab Results  Component Value Date   TSH 18.52 (H) 12/02/2016   TSH 1.015 04/21/2015   TSH 2.053 10/26/2014   FREET4 0.39 (L) 12/02/2016   FREET4 1.17 03/03/2014     She is on metformin  for mild diabetes, Followed by PCP  Lab Results  Component Value Date   HGBA1C 6.6 05/15/2016   HGBA1C 6.5 02/12/2016   HGBA1C 6.7 07/26/2015   Lab Results  Component Value Date   LDLCALC 127 (H) 07/26/2014   CREATININE 0.84 12/02/2016    OBESITY: The patient lost about a weight loss medication    EXAM:  BP 108/74   Pulse 98   Ht 5\' 3"  (1.6 m)   Wt 245 lb (111.1 kg)   SpO2 96%   BMI 43.40 kg/m     Assessment/Plan:   HYPERCALCEMIA: This appears to be long-standing , secondary to hyperparathyroidism Most recent calcium is 11.6 and persistently high Since it is now more than 1 mg above the upper limit of normal and persistently high as well as her having osteoporosis she meets the criteria for parathyroid exploration This was discussed with the patient   OSTEOPOROSIS with T score - 3.5 at the spine Risk factors for this are premature menopause and also persistent hyperparathyroidism  Currently she is on alendronate and will continue    Patient Instructions  Take thyroid pill every am    [facility] 12/06/2016, 12:12 PM

## 2016-12-12 ENCOUNTER — Encounter (HOSPITAL_COMMUNITY): Payer: Medicare PPO

## 2016-12-19 ENCOUNTER — Encounter (HOSPITAL_COMMUNITY)
Admission: RE | Admit: 2016-12-19 | Discharge: 2016-12-19 | Disposition: A | Discharge status: Home / Self Care | Payer: Medicare PPO | Source: Ambulatory Visit | Attending: Endocrinology | Admitting: Endocrinology

## 2016-12-19 ENCOUNTER — Encounter (HOSPITAL_COMMUNITY): Payer: Self-pay

## 2016-12-19 DIAGNOSIS — E210 Primary hyperparathyroidism: Secondary | ICD-10-CM

## 2016-12-19 DIAGNOSIS — E0590 Thyrotoxicosis, unspecified without thyrotoxic crisis or storm: Secondary | ICD-10-CM | POA: Diagnosis not present

## 2016-12-19 MED ORDER — TECHNETIUM TC 99M SESTAMIBI GENERIC - CARDIOLITE
25.1000 | Freq: Once | INTRAVENOUS | Status: AC | PRN
  Administered 2016-12-19: 25.1 via INTRAVENOUS

## 2016-12-24 NOTE — Progress Notes (Signed)
Please call to let patient know that the scan shows the overactive parathyroid on right and will refer to surgeon if ok with her

## 2016-12-25 ENCOUNTER — Telehealth: Payer: Self-pay | Admitting: Endocrinology

## 2016-12-25 ENCOUNTER — Other Ambulatory Visit: Payer: Self-pay | Admitting: Endocrinology

## 2016-12-25 DIAGNOSIS — D351 Benign neoplasm of parathyroid gland: Secondary | ICD-10-CM

## 2016-12-25 NOTE — Telephone Encounter (Signed)
Patient states that she is returning call to get lab results

## 2016-12-25 NOTE — Telephone Encounter (Signed)
Called patient back and gave her the scan results.

## 2017-01-22 ENCOUNTER — Ambulatory Visit: Payer: Self-pay | Admitting: Surgery

## 2017-01-22 DIAGNOSIS — E210 Primary hyperparathyroidism: Secondary | ICD-10-CM | POA: Diagnosis not present

## 2017-01-28 ENCOUNTER — Telehealth: Payer: Self-pay | Admitting: *Deleted

## 2017-01-28 NOTE — Telephone Encounter (Signed)
Attempted to call patient back x 1, but she did not answer and her voicemail box is full. Will try to call her again tomorrow.

## 2017-01-28 NOTE — Telephone Encounter (Signed)
Patient is scheduled for in/out surgery 02/18/2017. Thyroid is giving her problems. Please give her a call [phone].  Derl Barrow, RN

## 2017-01-30 ENCOUNTER — Other Ambulatory Visit: Payer: Self-pay | Admitting: Internal Medicine

## 2017-01-30 DIAGNOSIS — I10 Essential (primary) hypertension: Secondary | ICD-10-CM

## 2017-01-30 DIAGNOSIS — R7303 Prediabetes: Secondary | ICD-10-CM

## 2017-01-30 MED ORDER — LEVOTHYROXINE SODIUM 100 MCG PO TABS: 100.0000 ug | ORAL_TABLET | Freq: Every day | ORAL | 0 refills | Status: DC

## 2017-01-30 MED ORDER — LOSARTAN POTASSIUM 100 MG PO TABS: 100.0000 mg | ORAL_TABLET | Freq: Every day | ORAL | 0 refills | Status: DC

## 2017-01-30 MED ORDER — METFORMIN HCL 500 MG PO TABS
ORAL_TABLET | ORAL | 0 refills | Status: DC
Start: 2017-01-30 — End: 2017-09-08

## 2017-01-30 MED ORDER — AMLODIPINE BESYLATE 10 MG PO TABS: 10.0000 mg | ORAL_TABLET | Freq: Every day | ORAL | 0 refills | Status: DC

## 2017-01-30 MED ORDER — ATORVASTATIN CALCIUM 80 MG PO TABS: 80.0000 mg | ORAL_TABLET | Freq: Every day | ORAL | 0 refills | Status: DC

## 2017-01-30 MED ORDER — ALENDRONATE SODIUM 70 MG PO TABS: 70.0000 mg | ORAL_TABLET | ORAL | 1 refills | Status: DC

## 2017-01-30 MED ORDER — ASPIRIN EC 81 MG PO TBEC: 81.0000 mg | DELAYED_RELEASE_TABLET | Freq: Every day | ORAL | 3 refills | Status: DC

## 2017-01-30 NOTE — Telephone Encounter (Signed)
Called patient again. She states she is feeling nervous about her upcoming surgery and states she just wanted to let us know it would be happening. I let patient know that I would be thinking about her and she should let us know if she has any other questions/concerns prior to her surgery.

## 2017-02-11 NOTE — Pre-Procedure Instructions (Signed)
Morgan Collins  02/11/2017      [facility] [place], [place] [address] Phone: [phone] Fax: [phone]  [id number] [address], [address] [place] [address] Phone: [phone] Fax: [phone]    Your procedure is scheduled on September 4  Report to [place] at [place].M.  Call this number if you have problems the morning of surgery:  [phone]   Remember:  Do not eat food or drink liquids after midnight.  Continue all other medications as directed by your physician except follow these instructions about you medications   Take these medicines the morning of surgery with A SIP OF WATER  amLODipine (NORVASC)  levothyroxine (SYNTHROID, LEVOTHROID)  7 days prior to surgery STOP taking any Aleve, Naproxen, Ibuprofen, Motrin, Advil, Goody's, BC's, all herbal medications, fish oil, and all vitamins  Follow your doctors instructions regarding your Aspirin.  If no instructions were given by the doctor you will need to call the office to get instructions.  Your pre admission RN will also call for those instructions  WHAT DO I DO ABOUT MY DIABETES MEDICATION?   Marland Kitchen Do not take oral diabetes medicines (pills) the morning of surgery.metFORMIN (GLUCOPHAGE)   How to Manage Your Diabetes Before and After Surgery  Why is it important to control my blood sugar before and after surgery? . Improving blood sugar levels before and after surgery helps healing and can limit problems. . A way of improving blood sugar control is eating a healthy diet by: o  Eating less sugar and carbohydrates o  Increasing activity/exercise o  Talking with your doctor about reaching your blood sugar goals . High blood sugars (greater than 180 mg/dL) can raise your risk of infections and slow your recovery, so you will need to focus on controlling your  diabetes during the weeks before surgery. . Make sure that the doctor who takes care of your diabetes knows about your planned surgery including the date and location.  How do I manage my blood sugar before surgery? . Check your blood sugar at least 4 times a day, starting 2 days before surgery, to make sure that the level is not too high or low. o Check your blood sugar the morning of your surgery when you wake up and every 2 hours until you get to the Short Stay unit. . If your blood sugar is less than 70 mg/dL, you will need to treat for low blood sugar: o Do not take insulin. o Treat a low blood sugar (less than 70 mg/dL) with  cup of clear juice (cranberry or apple), 4 glucose tablets, OR glucose gel. o Recheck blood sugar in 15 minutes after treatment (to make sure it is greater than 70 mg/dL). If your blood sugar is not greater than 70 mg/dL on recheck, call [phone] for further instructions. . Report your blood sugar to the short stay nurse when you get to Short Stay.  . If you are admitted to the hospital after surgery: o Your blood sugar will be checked by the staff and you will probably be given insulin after surgery (instead of oral diabetes medicines) to make sure you have good blood sugar levels. o The goal for blood sugar control after surgery is 80-180 mg/dL.   Do not wear jewelry, make-up or nail polish.  Do not wear lotions, powders, or perfumes,  or deoderant.  Do not shave 48 hours prior to surgery.  Men may shave face and neck.  Do not bring valuables to the hospital.  [facility] is not responsible for any belongings or valuables.  Contacts, dentures or bridgework may not be worn into surgery.  Leave your suitcase in the car.  After surgery it may be brought to your room.  For patients admitted to the hospital, discharge time will be determined by your treatment team.  Patients discharged the day of surgery will not be allowed to drive home.    Special  instructions:   [facility]- Preparing For Surgery  Before surgery, you can play an important role. Because skin is not sterile, your skin needs to be as free of germs as possible. You can reduce the number of germs on your skin by washing with CHG (chlorahexidine gluconate) Soap before surgery.  CHG is an antiseptic cleaner which kills germs and bonds with the skin to continue killing germs even after washing.  Please do not use if you have an allergy to CHG or antibacterial soaps. If your skin becomes reddened/irritated stop using the CHG.  Do not shave (including legs and underarms) for at least 48 hours prior to first CHG shower. It is OK to shave your face.  Please follow these instructions carefully.   1. Shower the NIGHT BEFORE SURGERY and the MORNING OF SURGERY with CHG.   2. If you chose to wash your hair, wash your hair first as usual with your normal shampoo.  3. After you shampoo, rinse your hair and body thoroughly to remove the shampoo.  4. Use CHG as you would any other liquid soap. You can apply CHG directly to the skin and wash gently with a scrungie or a clean washcloth.   5. Apply the CHG Soap to your body ONLY FROM THE NECK DOWN.  Do not use on open wounds or open sores. Avoid contact with your eyes, ears, mouth and genitals (private parts). Wash genitals (private parts) with your normal soap.  6. Wash thoroughly, paying special attention to the area where your surgery will be performed.  7. Thoroughly rinse your body with warm water from the neck down.  8. DO NOT shower/wash with your normal soap after using and rinsing off the CHG Soap.  9. Pat yourself dry with a CLEAN TOWEL.   10. Wear CLEAN PAJAMAS   11. Place CLEAN SHEETS on your bed the night of your first shower and DO NOT SLEEP WITH PETS.    Day of Surgery: Do not apply any deodorants/lotions. Please wear clean clothes to the hospital/surgery center.      Please read over the following fact sheets  that you were given.

## 2017-02-12 ENCOUNTER — Encounter (HOSPITAL_COMMUNITY): Payer: Self-pay | Admitting: *Deleted

## 2017-02-12 ENCOUNTER — Encounter (HOSPITAL_COMMUNITY)
Admission: RE | Admit: 2017-02-12 | Discharge: 2017-02-12 | Disposition: A | Discharge status: Home / Self Care | Payer: Medicare PPO | Source: Ambulatory Visit | Attending: Surgery | Admitting: Surgery

## 2017-02-12 DIAGNOSIS — Z7984 Long term (current) use of oral hypoglycemic drugs: Secondary | ICD-10-CM | POA: Diagnosis not present

## 2017-02-12 DIAGNOSIS — R7303 Prediabetes: Secondary | ICD-10-CM | POA: Insufficient documentation

## 2017-02-12 DIAGNOSIS — Z01812 Encounter for preprocedural laboratory examination: Secondary | ICD-10-CM | POA: Diagnosis not present

## 2017-02-12 DIAGNOSIS — Z79899 Other long term (current) drug therapy: Secondary | ICD-10-CM | POA: Insufficient documentation

## 2017-02-12 DIAGNOSIS — E039 Hypothyroidism, unspecified: Secondary | ICD-10-CM | POA: Insufficient documentation

## 2017-02-12 DIAGNOSIS — I10 Essential (primary) hypertension: Secondary | ICD-10-CM | POA: Insufficient documentation

## 2017-02-12 DIAGNOSIS — Z8661 Personal history of infections of the central nervous system: Secondary | ICD-10-CM | POA: Diagnosis not present

## 2017-02-12 DIAGNOSIS — Z7983 Long term (current) use of bisphosphonates: Secondary | ICD-10-CM | POA: Insufficient documentation

## 2017-02-12 DIAGNOSIS — Z7982 Long term (current) use of aspirin: Secondary | ICD-10-CM | POA: Diagnosis not present

## 2017-02-12 DIAGNOSIS — G4733 Obstructive sleep apnea (adult) (pediatric): Secondary | ICD-10-CM | POA: Diagnosis not present

## 2017-02-12 DIAGNOSIS — Z01818 Encounter for other preprocedural examination: Secondary | ICD-10-CM | POA: Insufficient documentation

## 2017-02-12 DIAGNOSIS — E210 Primary hyperparathyroidism: Secondary | ICD-10-CM | POA: Insufficient documentation

## 2017-02-12 HISTORY — DX: Sleep apnea, unspecified: G47.30

## 2017-02-12 HISTORY — DX: Adverse effect of unspecified anesthetic, initial encounter: T41.45XA

## 2017-02-12 HISTORY — DX: Other complications of anesthesia, initial encounter: T88.59XA

## 2017-02-12 LAB — BASIC METABOLIC PANEL
Anion gap: 5 (ref 5–15)
BUN: 13 mg/dL (ref 6–20)
CO2: 25 mmol/L (ref 22–32)
Calcium: 10.6 mg/dL — ABNORMAL HIGH (ref 8.9–10.3)
Chloride: 108 mmol/L (ref 101–111)
Creatinine, Ser: 0.73 mg/dL (ref 0.44–1.00)
GFR calc Af Amer: >60 mL/min (ref >60)
GFR calc non Af Amer: >60 mL/min (ref >60)
Glucose, Bld: 92 mg/dL (ref 65–99)
Potassium: 4 mmol/L (ref 3.5–5.1)
Sodium: 138 mmol/L (ref 135–145)

## 2017-02-12 LAB — CBC
HCT: 39.1 % (ref 36.0–46.0)
Hemoglobin: 11.8 g/dL — ABNORMAL LOW (ref 12.0–15.0)
MCH: 25.6 pg — ABNORMAL LOW (ref 26.0–34.0)
MCHC: 30.2 g/dL (ref 30.0–36.0)
MCV: 84.8 fL (ref 78.0–100.0)
Platelets: 251 10*3/uL (ref 150–400)
RBC: 4.61 MIL/uL (ref 3.87–5.11)
RDW: 16.3 % — ABNORMAL HIGH (ref 11.5–15.5)
WBC: 7.9 10*3/uL (ref 4.0–10.5)

## 2017-02-12 LAB — HEMOGLOBIN A1C
Hgb A1c MFr Bld: 6.7 % — ABNORMAL HIGH (ref 4.8–5.6)
Mean Plasma Glucose: 145.59 mg/dL

## 2017-02-12 LAB — GLUCOSE, CAPILLARY: Glucose-Capillary: 108 mg/dL — ABNORMAL HIGH (ref 65–99)

## 2017-02-12 NOTE — Pre-Procedure Instructions (Addendum)
Morgan Collins  02/12/2017      [facility] [place], [place] [address] Phone: [phone] Fax: [phone]  [id number] [address], [place] [address] Phone: [phone] Fax: [phone]    Your procedure is scheduled on September 4  Report to [place] at [place].M.  Call this number if you have problems the morning of surgery:  [phone]   Remember:  Do not eat food or drink liquids after midnight.  Continue all other medications as directed by your physician except follow these instructions about you medications   Take these medicines the morning of surgery with A SIP OF WATER    levothyroxine (SYNTHROID, LEVOTHROID)  prior to surgery STOP(today 02/12/17)  taking any Aleve, Naproxen, Ibuprofen, Motrin, Advil, Goody's, BC's, all herbal medications, fish oil, and all vitamins,cod liver oil,aspirin   . Do not take oral diabetes medicines (pills) the morning of surgery.metFORMIN (GLUCOPHAGE)   How to Manage Your Diabetes Before and After Surgery  Why is it important to control my blood sugar before and after surgery? . Improving blood sugar levels before and after surgery helps healing and can limit problems. . A way of improving blood sugar control is eating a healthy diet by: o  Eating less sugar and carbohydrates o  Increasing activity/exercise o  Talking with your doctor about reaching your blood sugar goals . High blood sugars (greater than 180 mg/dL) can raise your risk of infections and slow your recovery, so you will need to focus on controlling your diabetes during the weeks before surgery. . Make sure that the doctor who takes care of your diabetes knows about your planned surgery including the date and location.  How do I manage my blood sugar before surgery? . Check your blood sugar at least 4  times a day, starting 2 days before surgery, to make sure that the level is not too high or low. o Check your blood sugar the morning of your surgery when you wake up and every 2 hours until you get to the Short Stay unit. . If your blood sugar is less than 70 mg/dL, you will need to treat for low blood sugar: o Do not take insulin. o Treat a low blood sugar (less than 70 mg/dL) with  cup of clear juice (cranberry or apple), 4 glucose tablets, OR glucose gel. o Recheck blood sugar in 15 minutes after treatment (to make sure it is greater than 70 mg/dL). If your blood sugar is not greater than 70 mg/dL on recheck, call [phone] for further instructions. . Report your blood sugar to the short stay nurse when you get to Short Stay.  . If you are admitted to the hospital after surgery: o Your blood sugar will be checked by the staff and you will probably be given insulin after surgery (instead of oral diabetes medicines) to make sure you have good blood sugar levels. o The goal for blood sugar control after surgery is 80-180 mg/dL.   Do not wear jewelry, make-up or nail polish.  Do not wear lotions, powders, or perfumes, or deoderant.  Do not shave 48 hours prior to surgery.  Men may shave face and neck.  Do not bring valuables to the hospital.  [facility] is not responsible for any belongings or valuables.  Contacts, dentures or bridgework may not be worn  into surgery.  Leave your suitcase in the car.  After surgery it may be brought to your room.  For patients admitted to the hospital, discharge time will be determined by your treatment team.  Patients discharged the day of surgery will not be allowed to drive home.    Special instructions:   [facility]- Preparing For Surgery  Before surgery, you can play an important role. Because skin is not sterile, your skin needs to be as free of germs as possible. You can reduce the number of germs on your skin by washing with CHG  (chlorahexidine gluconate) Soap before surgery.  CHG is an antiseptic cleaner which kills germs and bonds with the skin to continue killing germs even after washing.  Please do not use if you have an allergy to CHG or antibacterial soaps. If your skin becomes reddened/irritated stop using the CHG.  Do not shave (including legs and underarms) for at least 48 hours prior to first CHG shower. It is OK to shave your face.  Please follow these instructions carefully.   1. Shower the NIGHT BEFORE SURGERY and the MORNING OF SURGERY with CHG.   2. If you chose to wash your hair, wash your hair first as usual with your normal shampoo.  3. After you shampoo, rinse your hair and body thoroughly to remove the shampoo.  4. Use CHG as you would any other liquid soap. You can apply CHG directly to the skin and wash gently with a scrungie or a clean washcloth.   5. Apply the CHG Soap to your body ONLY FROM THE NECK DOWN.  Do not use on open wounds or open sores. Avoid contact with your eyes, ears, mouth and genitals (private parts). Wash genitals (private parts) with your normal soap.  6. Wash thoroughly, paying special attention to the area where your surgery will be performed.  7. Thoroughly rinse your body with warm water from the neck down.  8. DO NOT shower/wash with your normal soap after using and rinsing off the CHG Soap.  9. Pat yourself dry with a CLEAN TOWEL.   10. Wear CLEAN PAJAMAS   11. Place CLEAN SHEETS on your bed the night of your first shower and DO NOT SLEEP WITH PETS.    Day of Surgery: Do not apply any deodorants/lotions. Please wear clean clothes to the hospital/surgery center.      Please read over the  fact sheets that you were given.

## 2017-02-13 ENCOUNTER — Encounter (HOSPITAL_COMMUNITY): Payer: Self-pay

## 2017-02-13 ENCOUNTER — Encounter (HOSPITAL_COMMUNITY): Payer: Self-pay | Admitting: Surgery

## 2017-02-13 NOTE — H&P (Signed)
General Surgery [facility]  Morgan Collins DOB: Mar 27, 1956 Single / Language: [name] / Race: Black or African American Female   History of Present Illness  The patient is a 61 year old female who presents with primary hyperparathyroidism.  CC: primary hyperparathyroidism  Patient is referred by Dr. Elayne Snare for evaluation and management of primary hyperparathyroidism. Patient has had an elevated serum calcium level dating back to 2014. Most recent calcium level is elevated at 11.5. Vitamin D level is normal at 32. Intact PTH level from 2016 was elevated at 123. Patient has a history of osteoporosis and has been taking Fosamax. She has had previous anterior neck surgery with tracheostomy. Patient underwent nuclear medicine parathyroid scan on December 19, 2016. This localized a right inferior parathyroid adenoma. Patient has a history of endocrine disease including hypothyroidism for which she takes levothyroxine. She has no history of nephrolithiasis. She is accompanied today by her friend.   Past Surgical History Hysterectomy (not due to cancer) - Complete   Allergies No Known Allergies 01/22/2017  Medication History Alendronate Sodium (70MG  Tablet, Oral) Active. AmLODIPine Besylate (10MG  Tablet, Oral) Active. Atorvastatin Calcium (80MG  Tablet, Oral) Active. Levothyroxine Sodium (100MCG Tablet, Oral) Active. Losartan Potassium (100MG  Tablet, Oral) Active. MetFORMIN HCl (500MG  Tablet, Oral) Active. Aspirin (81MG  Tablet DR, Oral) Active. Omega-3 (1000MG  Capsule, Oral) Active. Vitamin D (Cholecalciferol) (1000UNIT Capsule, Oral) Active. Vitamin B 12 (50MCG Tablet, Oral) Active. Cod Liver Oil (1000MG  Capsule, Oral) Active. Medications Reconciled  Social History Caffeine use  Carbonated beverages, Coffee, Tea. No alcohol use  No drug use  Tobacco use  Never smoker.  Family History Breast Cancer  Sister. Diabetes Mellitus   Family Members In General. Thyroid problems  Sister.  Other Problems Oophorectomy   Review of Systems General Present- Weight Gain. Not Present- Appetite Loss, Chills, Fatigue, Fever, Night Sweats and Weight Loss. Skin Not Present- Change in Wart/Mole, Dryness, Hives, Jaundice, New Lesions, Non-Healing Wounds, Rash and Ulcer. HEENT Present- Visual Disturbances. Not Present- Earache, Hearing Loss, Hoarseness, Nose Bleed, Oral Ulcers, Ringing in the Ears, Seasonal Allergies, Sinus Pain, Sore Throat, Wears glasses/contact lenses and Yellow Eyes. Respiratory Present- Snoring and Wheezing. Not Present- Bloody sputum, Chronic Cough and Difficulty Breathing. Breast Not Present- Breast Mass, Breast Pain, Nipple Discharge and Skin Changes. Cardiovascular Present- Leg Cramps, Shortness of Breath and Swelling of Extremities. Not Present- Chest Pain, Difficulty Breathing Lying Down, Palpitations and Rapid Heart Rate. Female Genitourinary Present- Frequency. Not Present- Nocturia, Painful Urination, Pelvic Pain and Urgency. Neurological Present- Decreased Memory, Headaches and Tingling. Not Present- Fainting, Numbness, Seizures, Tremor, Trouble walking and Weakness. Psychiatric Present- Depression. Not Present- Anxiety, Bipolar, Change in Sleep Pattern, Fearful and Frequent crying. Endocrine Present- Hot flashes. Not Present- Cold Intolerance, Excessive Hunger, Hair Changes, Heat Intolerance and New Diabetes. Hematology Present- Gland problems. Not Present- Blood Thinners, Easy Bruising, Excessive bleeding, HIV and Persistent Infections.  Vitals Weight: 244.4 lb Height: 61in Height was reported by patient. Body Surface Area: 2.06 m Body Mass Index: 46.18 kg/m  Temp.: 97.64F(Oral)  Pulse: 73 (Regular)  BP: 124/74 (Sitting, Left Arm, Standard)  Physical Exam The physical exam findings are as follows: Note:CONSTITUTIONAL See vital signs recorded above  GENERAL  APPEARANCE Development: normal Nutritional status: normal Gross deformities: none  SKIN Rash, lesions, ulcers: none Induration, erythema: none Nodules: none palpable  EYES Conjunctiva and lids: normal Pupils: equal and reactive Iris: normal bilaterally  EARS, NOSE, MOUTH, THROAT External ears: no lesion or deformity External nose: no lesion  or deformity Hearing: grossly normal Lips: no lesion or deformity Dentition: normal for age Oral mucosa: moist  NECK Symmetric: yes Trachea: midline Thyroid: no palpable nodules in the thyroid bed Well-healed surgical scar in the anterior midline consistent with tracheostomy.  CHEST Respiratory effort: normal Retraction or accessory muscle use: no Breath sounds: normal bilaterally Rales, rhonchi, wheeze: none  CARDIOVASCULAR Auscultation: regular rhythm, normal rate Murmurs: none Pulses: carotid and radial pulse 2+ palpable Lower extremity edema: Mild bilateral Lower extremity varicosities: none  MUSCULOSKELETAL Station and gait: normal Digits and nails: no clubbing or cyanosis Muscle strength: grossly normal all extremities Range of motion: grossly normal all extremities Deformity: none  LYMPHATIC Cervical: none palpable Supraclavicular: none palpable  PSYCHIATRIC Oriented to person, place, and time: yes Mood and affect: normal for situation Judgment and insight: appropriate for situation    Assessment & Plan  PRIMARY HYPERPARATHYROIDISM (E21.0)  Pt Education - Pamphlet Given - The Parathyroid Surgery Book: discussed with patient and provided information.  Patient presents with biochemical evidence of primary hyperparathyroidism. She is provided with written literature on parathyroid surgery to review at home.  Patient has elevated calcium and PTH levels consistent with primary hyperparathyroidism. Nuclear medicine parathyroid scan localizes an adenoma to the right inferior position. I have recommended right  inferior parathyroidectomy by minimally invasive technique. We have discussed the risk and benefits of the surgery. We have discussed the potential for a second parathyroid gland adenoma. We have discussed potential injury to the recurrent laryngeal nerves. This may be performed as an outpatient surgical procedure. We will make arrangements for surgery at a time convenient for the patient in the near future.  The risks and benefits of the procedure have been discussed at length with the patient. The patient understands the proposed procedure, potential alternative treatments, and the course of recovery to be expected. All of the patient's questions have been answered at this time. The patient wishes to proceed with surgery.  Earnstine Regal, MD, [facility] Office: [phone]

## 2017-02-13 NOTE — Progress Notes (Addendum)
Anesthesia Chart Review: Patient is a 61 year old female scheduled for right inferior parathyroidectomy on 02/18/17 by Dr. Armandina Gemma.  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 She reported a prior history of encephalitis with coma X 23 days in which she required a temporary tracheostomy in 1982. BMI is consistent with morbid obesity.   - PCP is Dr. Hyman Bible with [facility]. Patient notified her of surgery plans. - Endocrinologist is Dr. Elayne Snare.  - She is not followed regularly by a cardiologist, but was seen by Dr. Mertie Moores on 02/08/14 for atypical chest pain. He did not think she needed a stress test at that point and recommended PRN cardiology follow-up.  Meds include Fosamax, amlodipine, ASA 81 mg (on hold starting 02/12/17), Lipitor, Celexa, cod liver oil, levothyroxine, losartan, metformin, omega 3, vitamin D.   BP 134/86   Pulse 75   Temp 36.8 C   Resp 20   Ht 5\' 1"  (1.549 m)   Wt 240 lb 9.6 oz (109.1 kg)   SpO2 99%   BMI 45.46 kg/m   EKG 02/12/17: NSR, minimal voltage criteria for LVH, may be normal variant.  NM parathyroid scan 12/19/16: IMPRESSION: Focal activity localizing to the inferior aspect of the RIGHT lobe of thyroid gland is consistent parathyroid adenoma.  Preoperative labs noted. Cr 0.73. H/H 11.8/39.1. PLT 251. Ca 10.6. A1c 6.7.   If no acute changes then I would anticipate that she can proceed as planned.  George Hugh [facility] Short Stay Center/Anesthesiology Phone [phone] 02/13/2017 10:39 AM

## 2017-02-18 ENCOUNTER — Encounter (HOSPITAL_COMMUNITY): Payer: Self-pay

## 2017-02-18 ENCOUNTER — Encounter (HOSPITAL_COMMUNITY)
Admission: RE | Disposition: A | Discharge status: Home / Self Care | Payer: Self-pay | Source: Ambulatory Visit | Attending: Surgery

## 2017-02-18 ENCOUNTER — Ambulatory Visit (HOSPITAL_COMMUNITY): Payer: Medicare PPO | Admitting: Vascular Surgery

## 2017-02-18 ENCOUNTER — Ambulatory Visit (HOSPITAL_COMMUNITY): Payer: Medicare PPO | Admitting: Anesthesiology

## 2017-02-18 ENCOUNTER — Ambulatory Visit (HOSPITAL_COMMUNITY)
Admission: RE | Admit: 2017-02-18 | Discharge: 2017-02-18 | Disposition: A | Discharge status: Home / Self Care | Payer: Medicare PPO | Source: Ambulatory Visit | Attending: Surgery | Admitting: Surgery

## 2017-02-18 DIAGNOSIS — I10 Essential (primary) hypertension: Secondary | ICD-10-CM | POA: Diagnosis not present

## 2017-02-18 DIAGNOSIS — E119 Type 2 diabetes mellitus without complications: Secondary | ICD-10-CM | POA: Diagnosis not present

## 2017-02-18 DIAGNOSIS — E669 Obesity, unspecified: Secondary | ICD-10-CM | POA: Diagnosis not present

## 2017-02-18 DIAGNOSIS — E210 Primary hyperparathyroidism: Secondary | ICD-10-CM | POA: Diagnosis present

## 2017-02-18 DIAGNOSIS — Z7982 Long term (current) use of aspirin: Secondary | ICD-10-CM | POA: Diagnosis not present

## 2017-02-18 DIAGNOSIS — D351 Benign neoplasm of parathyroid gland: Secondary | ICD-10-CM | POA: Insufficient documentation

## 2017-02-18 DIAGNOSIS — Z79899 Other long term (current) drug therapy: Secondary | ICD-10-CM | POA: Diagnosis not present

## 2017-02-18 DIAGNOSIS — E039 Hypothyroidism, unspecified: Secondary | ICD-10-CM | POA: Diagnosis not present

## 2017-02-18 DIAGNOSIS — Z6841 Body Mass Index (BMI) 40.0 and over, adult: Secondary | ICD-10-CM | POA: Insufficient documentation

## 2017-02-18 DIAGNOSIS — I639 Cerebral infarction, unspecified: Secondary | ICD-10-CM | POA: Diagnosis not present

## 2017-02-18 HISTORY — PX: PARATHYROIDECTOMY: SHX19

## 2017-02-18 LAB — GLUCOSE, CAPILLARY
Glucose-Capillary: 109 mg/dL — ABNORMAL HIGH (ref 65–99)
Glucose-Capillary: 110 mg/dL — ABNORMAL HIGH (ref 65–99)

## 2017-02-18 SURGERY — PARATHYROIDECTOMY
Anesthesia: General | Site: Neck | Laterality: Right

## 2017-02-18 MED ORDER — PHENYLEPHRINE HCL 10 MG/ML IJ SOLN
INTRAMUSCULAR | Status: DC | PRN
  Administered 2017-02-18: 40 ug via INTRAVENOUS
  Administered 2017-02-18: 80 ug via INTRAVENOUS
  Administered 2017-02-18 (×3): 40 ug via INTRAVENOUS

## 2017-02-18 MED ORDER — CEFAZOLIN SODIUM-DEXTROSE 2-4 GM/100ML-% IV SOLN
2.0000 g | INTRAVENOUS | Status: AC
  Administered 2017-02-18: 2 g via INTRAVENOUS
  Filled 2017-02-18: qty 100

## 2017-02-18 MED ORDER — MIDAZOLAM HCL 2 MG/2ML IJ SOLN
INTRAMUSCULAR | Status: AC
  Filled 2017-02-18: qty 2

## 2017-02-18 MED ORDER — LIDOCAINE HCL (CARDIAC) 20 MG/ML IV SOLN
INTRAVENOUS | Status: DC | PRN
  Administered 2017-02-18: 50 mg via INTRAVENOUS

## 2017-02-18 MED ORDER — FENTANYL CITRATE (PF) 100 MCG/2ML IJ SOLN
INTRAMUSCULAR | Status: DC | PRN
  Administered 2017-02-18 (×2): 100 ug via INTRAVENOUS
  Administered 2017-02-18: 50 ug via INTRAVENOUS

## 2017-02-18 MED ORDER — HEMOSTATIC AGENTS (NO CHARGE) OPTIME
TOPICAL | Status: DC | PRN
  Administered 2017-02-18: 1 via TOPICAL

## 2017-02-18 MED ORDER — DEXAMETHASONE SODIUM PHOSPHATE 10 MG/ML IJ SOLN
INTRAMUSCULAR | Status: DC | PRN
  Administered 2017-02-18: 5 mg via INTRAVENOUS

## 2017-02-18 MED ORDER — ARTIFICIAL TEARS OPHTHALMIC OINT
TOPICAL_OINTMENT | OPHTHALMIC | Status: DC | PRN
  Administered 2017-02-18: 1 via OPHTHALMIC

## 2017-02-18 MED ORDER — MIDAZOLAM HCL 5 MG/5ML IJ SOLN
INTRAMUSCULAR | Status: DC | PRN
  Administered 2017-02-18: 2 mg via INTRAVENOUS

## 2017-02-18 MED ORDER — 0.9 % SODIUM CHLORIDE (POUR BTL) OPTIME
TOPICAL | Status: DC | PRN
  Administered 2017-02-18: 1000 mL

## 2017-02-18 MED ORDER — PROPOFOL 10 MG/ML IV BOLUS
INTRAVENOUS | Status: DC | PRN
  Administered 2017-02-18: 140 mg via INTRAVENOUS

## 2017-02-18 MED ORDER — SUGAMMADEX SODIUM 200 MG/2ML IV SOLN
INTRAVENOUS | Status: DC | PRN
  Administered 2017-02-18: 200 mg via INTRAVENOUS

## 2017-02-18 MED ORDER — CHLORHEXIDINE GLUCONATE CLOTH 2 % EX PADS: 6.0000 | MEDICATED_PAD | Freq: Once | CUTANEOUS | Status: DC

## 2017-02-18 MED ORDER — ONDANSETRON HCL 4 MG/2ML IJ SOLN
INTRAMUSCULAR | Status: DC | PRN
  Administered 2017-02-18: 4 mg via INTRAVENOUS

## 2017-02-18 MED ORDER — HYDROCODONE-ACETAMINOPHEN 5-325 MG PO TABS: 1.0000 | ORAL_TABLET | ORAL | 0 refills | Status: DC | PRN

## 2017-02-18 MED ORDER — LACTATED RINGERS IV SOLN
INTRAVENOUS | Status: DC | PRN
  Administered 2017-02-18: 07:00:00 via INTRAVENOUS

## 2017-02-18 MED ORDER — ROCURONIUM BROMIDE 100 MG/10ML IV SOLN
INTRAVENOUS | Status: DC | PRN
  Administered 2017-02-18: 50 mg via INTRAVENOUS

## 2017-02-18 MED ORDER — FENTANYL CITRATE (PF) 100 MCG/2ML IJ SOLN: 25.0000 ug | INTRAMUSCULAR | Status: DC | PRN

## 2017-02-18 MED ORDER — FENTANYL CITRATE (PF) 250 MCG/5ML IJ SOLN
INTRAMUSCULAR | Status: AC
  Filled 2017-02-18: qty 5

## 2017-02-18 MED ORDER — ONDANSETRON HCL 4 MG/2ML IJ SOLN: 4.0000 mg | Freq: Once | INTRAMUSCULAR | Status: DC | PRN

## 2017-02-18 MED ORDER — BUPIVACAINE HCL (PF) 0.5 % IJ SOLN
INTRAMUSCULAR | Status: AC
  Filled 2017-02-18: qty 30

## 2017-02-18 MED ORDER — OXYCODONE HCL 5 MG/5ML PO SOLN: 5.0000 mg | Freq: Once | ORAL | Status: DC | PRN

## 2017-02-18 MED ORDER — OXYCODONE HCL 5 MG PO TABS: 5.0000 mg | ORAL_TABLET | Freq: Once | ORAL | Status: DC | PRN

## 2017-02-18 MED ORDER — BUPIVACAINE HCL (PF) 0.5 % IJ SOLN
INTRAMUSCULAR | Status: DC | PRN
  Administered 2017-02-18: 16 mL

## 2017-02-18 MED ORDER — PROPOFOL 10 MG/ML IV BOLUS
INTRAVENOUS | Status: AC
  Filled 2017-02-18: qty 40

## 2017-02-18 MED ORDER — PHENYLEPHRINE HCL 10 MG/ML IJ SOLN
INTRAVENOUS | Status: DC | PRN
  Administered 2017-02-18: 20 ug/min via INTRAVENOUS

## 2017-02-18 SURGICAL SUPPLY — 55 items
APL SKNCLS STERI-STRIP NONHPOA (GAUZE/BANDAGES/DRESSINGS) ×1
ATTRACTOMAT 16X20 MAGNETIC DRP (DRAPES) IMPLANT
BENZOIN TINCTURE PRP APPL 2/3 (GAUZE/BANDAGES/DRESSINGS) ×2 IMPLANT
BLADE SURG 15 STRL LF DISP TIS (BLADE) ×1 IMPLANT
BLADE SURG 15 STRL SS (BLADE) ×1
CANISTER SUCT 3000ML PPV (MISCELLANEOUS) ×2 IMPLANT
CHLORAPREP W/TINT 26ML (MISCELLANEOUS) ×2 IMPLANT
CLIP VESOCCLUDE MED 6/CT (CLIP) ×2 IMPLANT
CLIP VESOCCLUDE SM WIDE 6/CT (CLIP) ×2 IMPLANT
CLOSURE STERI-STRIP 1/4X4 (GAUZE/BANDAGES/DRESSINGS) ×2 IMPLANT
CONT SPEC 4OZ CLIKSEAL STRL BL (MISCELLANEOUS) ×4 IMPLANT
COVER SURGICAL LIGHT HANDLE (MISCELLANEOUS) ×2 IMPLANT
CRADLE DONUT ADULT HEAD (MISCELLANEOUS) ×2 IMPLANT
DRAPE LAPAROTOMY 100X72 PEDS (DRAPES) ×2 IMPLANT
DRAPE UTILITY XL STRL (DRAPES) ×2 IMPLANT
ELECT CAUTERY BLADE 6.4 (BLADE) ×2 IMPLANT
ELECT REM PT RETURN 9FT ADLT (ELECTROSURGICAL) ×2
ELECTRODE REM PT RTRN 9FT ADLT (ELECTROSURGICAL) ×1 IMPLANT
GAUZE SPONGE 2X2 8PLY STRL LF (GAUZE/BANDAGES/DRESSINGS) ×1 IMPLANT
GAUZE SPONGE 4X4 16PLY XRAY LF (GAUZE/BANDAGES/DRESSINGS) ×2 IMPLANT
GLOVE BIOGEL PI IND STRL 6.5 (GLOVE) ×2 IMPLANT
GLOVE BIOGEL PI IND STRL 7.5 (GLOVE) ×1 IMPLANT
GLOVE BIOGEL PI INDICATOR 6.5 (GLOVE) ×2
GLOVE BIOGEL PI INDICATOR 7.5 (GLOVE) ×1
GLOVE ECLIPSE 7.5 STRL STRAW (GLOVE) ×2 IMPLANT
GLOVE INDICATOR 7.0 STRL GRN (GLOVE) ×2 IMPLANT
GLOVE SURG ORTHO 8.0 STRL STRW (GLOVE) ×2 IMPLANT
GLOVE SURG SS PI 6.0 STRL IVOR (GLOVE) ×4 IMPLANT
GOWN STRL REUS W/ TWL LRG LVL3 (GOWN DISPOSABLE) ×2 IMPLANT
GOWN STRL REUS W/ TWL XL LVL3 (GOWN DISPOSABLE) ×1 IMPLANT
GOWN STRL REUS W/TWL LRG LVL3 (GOWN DISPOSABLE) ×4
GOWN STRL REUS W/TWL XL LVL3 (GOWN DISPOSABLE) ×2
HEMOSTAT SURGICEL 2X4 FIBR (HEMOSTASIS) ×2 IMPLANT
ILLUMINATOR WAVEGUIDE N/F (MISCELLANEOUS) IMPLANT
KIT BASIN OR (CUSTOM PROCEDURE TRAY) ×2 IMPLANT
KIT ROOM TURNOVER OR (KITS) ×2 IMPLANT
NEEDLE HYPO 25GX1X1/2 BEV (NEEDLE) ×2 IMPLANT
NS IRRIG 1000ML POUR BTL (IV SOLUTION) ×2 IMPLANT
PACK SURGICAL SETUP 50X90 (CUSTOM PROCEDURE TRAY) ×2 IMPLANT
PAD ARMBOARD 7.5X6 YLW CONV (MISCELLANEOUS) ×2 IMPLANT
PENCIL BUTTON HOLSTER BLD 10FT (ELECTRODE) ×2 IMPLANT
SPONGE GAUZE 2X2 STER 10/PKG (GAUZE/BANDAGES/DRESSINGS) ×1
SPONGE INTESTINAL PEANUT (DISPOSABLE) IMPLANT
STRIP CLOSURE SKIN 1/2X4 (GAUZE/BANDAGES/DRESSINGS) IMPLANT
SUT MNCRL AB 4-0 PS2 18 (SUTURE) ×2 IMPLANT
SUT SILK 2 0 (SUTURE) ×1
SUT SILK 2-0 18XBRD TIE 12 (SUTURE) ×1 IMPLANT
SUT SILK 3 0 (SUTURE)
SUT SILK 3-0 18XBRD TIE 12 (SUTURE) IMPLANT
SUT VIC AB 3-0 SH 18 (SUTURE) ×2 IMPLANT
SYR BULB 3OZ (MISCELLANEOUS) ×2 IMPLANT
SYR CONTROL 10ML LL (SYRINGE) ×2 IMPLANT
TOWEL OR 17X24 6PK STRL BLUE (TOWEL DISPOSABLE) ×2 IMPLANT
TOWEL OR 17X26 10 PK STRL BLUE (TOWEL DISPOSABLE) ×2 IMPLANT
TUBE CONNECTING 12X1/4 (SUCTIONS) ×2 IMPLANT

## 2017-02-18 NOTE — Transfer of Care (Signed)
Immediate Anesthesia Transfer of Care Note  Patient: Morgan Collins  Procedure(s) Performed: Procedure(s): RIGHT INFERIOR PARATHYROIDECTOMY (Right)  Patient Location: PACU  Anesthesia Type:General  Level of Consciousness: awake and alert   Airway & Oxygen Therapy: Patient Spontanous Breathing and Patient connected to nasal cannula oxygen  Post-op Assessment: Report given to RN and Post -op Vital signs reviewed and stable  Post vital signs: Reviewed and stable  Last Vitals:  Vitals:   02/18/17 0612 02/18/17 0857  BP: 124/83   Pulse: 68   Resp: 17   Temp: 36.8 C (!) (P) 36.4 C  SpO2: 96%     Last Pain:  Vitals:   02/18/17 0857  TempSrc:   PainSc: (P) Asleep      Patients Stated Pain Goal: 3 (49/61/16 [id number])  Complications: No apparent anesthesia complications

## 2017-02-18 NOTE — Anesthesia Procedure Notes (Signed)
Procedure Name: Intubation Performed by: Valda Favia Pre-anesthesia Checklist: Patient identified, Emergency Drugs available, Suction available and Patient being monitored Patient Re-evaluated:Patient Re-evaluated prior to induction Oxygen Delivery Method: Circle System Utilized Preoxygenation: Pre-oxygenation with 100% oxygen Induction Type: IV induction Ventilation: Mask ventilation without difficulty and Oral airway inserted - appropriate to patient size Laryngoscope Size: Mac and 4 Grade View: Grade II Tube type: Oral Tube size: 7.0 mm Number of attempts: 1 Airway Equipment and Method: Stylet and Oral airway Placement Confirmation: ETT inserted through vocal cords under direct vision,  positive ETCO2 and breath sounds checked- equal and bilateral Secured at: 21 cm Tube secured with: Tape Dental Injury: Injury to lip  Comments: DL by Vickii Penna, SRNA.  Injury to right upper lip ointment placed

## 2017-02-18 NOTE — Interval H&P Note (Signed)
History and Physical Interval Note:  02/18/2017 7:05 AM  Morgan Collins  has presented today for surgery, with the diagnosis of primary hyperparathyroidism.  The various methods of treatment have been discussed with the patient and family. After consideration of risks, benefits and other options for treatment, the patient has consented to    Procedure(s): RIGHT INFERIOR PARATHYROIDECTOMY (Right) as a surgical intervention .    The patient's history has been reviewed, patient examined, no change in status, stable for surgery.  I have reviewed the patient's chart and labs.  Questions were answered to the patient's satisfaction.    Earnstine Regal, MD, [facility] Office: [place]

## 2017-02-18 NOTE — Anesthesia Postprocedure Evaluation (Signed)
Anesthesia Post Note  Patient: Morgan Collins  Procedure(s) Performed: Procedure(s) (LRB): RIGHT INFERIOR PARATHYROIDECTOMY (Right)     Patient location during evaluation: PACU Anesthesia Type: General Level of consciousness: awake, awake and alert and oriented Pain management: pain level controlled Vital Signs Assessment: post-procedure vital signs reviewed and stable Respiratory status: spontaneous breathing, nonlabored ventilation and respiratory function stable Cardiovascular status: blood pressure returned to baseline Anesthetic complications: no    Last Vitals:  Vitals:   02/18/17 0928 02/18/17 0930  BP: (!) 133/95   Pulse: 86 81  Resp: 15 15  Temp:    SpO2: 93% 94%    Last Pain:  Vitals:   02/18/17 0927  TempSrc:   PainSc: 0-No pain                 Ark Agrusa COKER

## 2017-02-18 NOTE — Op Note (Signed)
OPERATIVE REPORT - PARATHYROIDECTOMY  Preoperative diagnosis: Primary hyperparathyroidism  Postop diagnosis: Same  Procedure: Right inferior and superior minimally invasive parathyroidectomy  Surgeon:  Earnstine Regal, MD, FACS  Assistant:  Judyann Munson, RNFA  Anesthesia: Gen. endotracheal  Estimated blood loss: Minimal  Preparation: ChloraPrep  Indications: Patient is referred by Dr. Elayne Snare for evaluation and management of primary hyperparathyroidism. Patient has had an elevated serum calcium level dating back to 2014. Most recent calcium level is elevated at 11.5. Vitamin D level is normal at 32. Intact PTH level from 2016 was elevated at 123. Patient has a history of osteoporosis and has been taking Fosamax. She has had previous anterior neck surgery with tracheostomy. Patient underwent nuclear medicine parathyroid scan on December 19, 2016. This localized a right inferior parathyroid adenoma.   Procedure: Patient was prepared in the holding area. He was brought to operating room and placed in a supine position on the operating room table. Following administration of general anesthesia, the patient was positioned and then prepped and draped in the usual strict aseptic fashion. After ascertaining that an adequate level of anesthesia been achieved, a neck incision was made with a #15 blade. Dissection was carried through subcutaneous tissues and platysma. Hemostasis was obtained with the electrocautery. Skin flaps were developed circumferentially and a Weitlander retractor was placed for exposure.  Strap muscles were incised in the midline. Strap muscles were reflected exposing the thyroid lobe. With gentle blunt dissection the thyroid lobe was mobilized.  Dissection was carried through adipose tissue and a mildly enlarged parathyroid gland was identified. It was gently mobilized. Vascular structures were divided between small and medium ligaclips. Care was taken to avoid the  recurrent laryngeal nerve and the esophagus. The parathyroid gland was completely excised. It was submitted to pathology where frozen section confirmed parathyroid tissue.  However, this was likely a normal parathyroid gland.  Further dissection revealed an enlarged gland posteriorly.  It was mobilized and was a superior gland which had descended on a long vascular pedicle into a low position on the right.    It was gently mobilized. Vascular structures were divided between small and medium ligaclips. Care was taken to avoid the recurrent laryngeal nerve and the esophagus. The parathyroid gland was completely excised. It was submitted to pathology where frozen section confirmed parathyroid tissue consistent with adenoma.  Neck was irrigated with warm saline and good hemostasis was noted. Fibrillar was placed in the operative field. Strap muscles were reapproximated in the midline with interrupted 3-0 Vicryl sutures. Platysma was closed with interrupted 3-0 Vicryl sutures. Skin was closed with a running 4-0 Monocryl subcuticular suture. Marcaine was infiltrated circumferentially. Wound was washed and dried and Steristrips were applied. Patient was awakened from anesthesia and brought to the recovery room. The patient tolerated the procedure well.   Earnstine Regal, MD, [facility]

## 2017-02-18 NOTE — Anesthesia Preprocedure Evaluation (Signed)
Anesthesia Evaluation  Patient identified by MRN, date of birth, ID band Patient awake    Reviewed: Allergy & Precautions, NPO status , Patient's Chart, lab work & pertinent test results  Airway Mallampati: II  TM Distance: >3 FB Neck ROM: Full    Dental  (+) Teeth Intact, Dental Advisory Given   Pulmonary    breath sounds clear to auscultation       Cardiovascular hypertension,  Rhythm:Regular Rate:Normal     Neuro/Psych    GI/Hepatic   Endo/Other    Renal/GU      Musculoskeletal   Abdominal (+) + obese,   Peds  Hematology   Anesthesia Other Findings   Reproductive/Obstetrics                             Anesthesia Physical Anesthesia Plan  ASA: III  Anesthesia Plan: General   Post-op Pain Management:    Induction: Intravenous  PONV Risk Score and Plan: Ondansetron, Midazolam and Metaclopromide  Airway Management Planned: Oral ETT  Additional Equipment:   Intra-op Plan:   Post-operative Plan: Extubation in OR  Informed Consent: I have reviewed the patients History and Physical, chart, labs and discussed the procedure including the risks, benefits and alternatives for the proposed anesthesia with the patient or authorized representative who has indicated his/her understanding and acceptance.   Dental advisory given  Plan Discussed with: CRNA and Anesthesiologist  Anesthesia Plan Comments:         Anesthesia Quick Evaluation

## 2017-02-19 ENCOUNTER — Encounter (HOSPITAL_COMMUNITY): Payer: Self-pay | Admitting: Surgery

## 2017-03-03 ENCOUNTER — Other Ambulatory Visit: Payer: Medicare PPO

## 2017-03-04 ENCOUNTER — Other Ambulatory Visit (INDEPENDENT_AMBULATORY_CARE_PROVIDER_SITE_OTHER): Payer: Medicare PPO

## 2017-03-04 DIAGNOSIS — E210 Primary hyperparathyroidism: Secondary | ICD-10-CM

## 2017-03-04 LAB — BASIC METABOLIC PANEL
BUN: 14 mg/dL (ref 6–23)
CO2: 26 mEq/L (ref 19–32)
Calcium: 9.6 mg/dL (ref 8.4–10.5)
Chloride: 104 mEq/L (ref 96–112)
Creatinine, Ser: 0.72 mg/dL (ref 0.40–1.20)
GFR: 105.72 mL/min (ref >60.00)
Glucose, Bld: 138 mg/dL — ABNORMAL HIGH (ref 70–99)
Potassium: 4.2 mEq/L (ref 3.5–5.1)
Sodium: 139 mEq/L (ref 135–145)

## 2017-03-05 DIAGNOSIS — E210 Primary hyperparathyroidism: Secondary | ICD-10-CM | POA: Diagnosis not present

## 2017-03-07 ENCOUNTER — Encounter: Payer: Self-pay | Admitting: Endocrinology

## 2017-03-07 ENCOUNTER — Ambulatory Visit (INDEPENDENT_AMBULATORY_CARE_PROVIDER_SITE_OTHER): Payer: Medicare PPO | Admitting: Endocrinology

## 2017-03-07 VITALS — BP 106/78 | HR 96 | Temp 98.6°F | Resp 15 | Ht 61.0 in | Wt 239.8 lb

## 2017-03-07 DIAGNOSIS — E210 Primary hyperparathyroidism: Secondary | ICD-10-CM | POA: Diagnosis not present

## 2017-03-07 NOTE — Progress Notes (Signed)
Patient ID: Morgan Collins, female   DOB: 1956/06/11, 61 y.o.   MRN: [id number]           Chief complaint: High calcium and osteoporosis   History of Present Illness:    She was initially evaluated for hyperparathyroidism in 8/16   Review of records show that she has had a high calcium since about 2014, she had upper normal level in 2013 and previous records are not available. Her calcium levels have previously ranged between 10.8-11.6 although relatively higher since 11/16 Also her PCP has stopped her HCTZ in 12/16  She continues to be asymptomatic with no unusual weight loss or fatigue and no history of fractures  Her calcium was persistently high at 11.6 and she was referred for surgery She did have an abnormal scan showing the right inferior parathyroid adenoma but on surgery she was also found to have a larger superior adenoma on the right side and this was removed also.  The right superior adenoma was 2 cm in size  She feels well since surgery Calcium is now back to 9.6   Lab Results  Component Value Date   CALCIUM 9.6 03/04/2017   CALCIUM 10.6 (H) 02/12/2017   CALCIUM 11.6 (H) 12/02/2016   CALCIUM 11.5 (H) 08/23/2016   CALCIUM 10.5 02/26/2016   CALCIUM 11.0 (H) 08/14/2015   CALCIUM 11.4 (H) 05/19/2015   CALCIUM 11.5 (H) 04/21/2015   CALCIUM 10.7 (H) 03/23/2015     Prior Labs:  Lab Results  Component Value Date   PTH 123 (H) 10/11/2014   CALCIUM 9.6 03/04/2017   CAION 1.57 (H) 10/26/2014   PHOS 2.8 12/02/2016      25 (OH) Vitamin D level was 26 And was started on vitamin D3, 2000 units daily   Lab Results  Component Value Date   VD25OH 32.88 12/02/2016      OSTEOPOROSIS:  She had a bone density checked because of her history of hyperparathyroidism   Bone mineral density done in 11/2014 showed T score of -3.5 at the spine but only -1.4 at the femur She apparently had oophorectomy in her 87s and her second ovary was removed at about the age of 57  because of a large cyst She has never been given hormone supplements No history of height loss  She was started on Actonel 150 mg monthly on her initial consultation in 8/16 Because of the expense this was stopped  She has been on Fosamax that was started in March 2018 and she has no side effects from this  She was offered other options such as Reclast and Prolia but she refuses to take any injectable drugs      Allergies as of 03/07/2017   No Known Allergies     Medication List       Accurate as of 03/07/17  1:33 PM. Always use your most recent med list.          alendronate 70 MG tablet Commonly known as:  FOSAMAX Take 1 tablet (70 mg total) by mouth every 7 (seven) days. Take with a full glass of water on an empty stomach.   amLODipine 10 MG tablet Commonly known as:  NORVASC Take 1 tablet (10 mg total) by mouth daily.   aspirin EC 81 MG tablet Take 1 tablet (81 mg total) by mouth daily.   atorvastatin 80 MG tablet Commonly known as:  LIPITOR Take 1 tablet (80 mg total) by mouth daily.   B-12 PO Take 1 tablet  by mouth daily.   citalopram 20 MG tablet Commonly known as:  CELEXA Take 20 mg by mouth every evening.   COD LIVER OIL PO Take 1 capsule by mouth daily.   levothyroxine 100 MCG tablet Commonly known as:  SYNTHROID, LEVOTHROID Take 1 tablet (100 mcg total) by mouth daily.   losartan 100 MG tablet Commonly known as:  COZAAR Take 1 tablet (100 mg total) by mouth daily.   metFORMIN 500 MG tablet Commonly known as:  GLUCOPHAGE TAKE 1 TABLET TWICE DAILY WITH A MEAL   OMEGA 3 PO Take 1 capsule by mouth daily.   VITAMIN D (ERGOCALCIFEROL) PO Take 2 tablets by mouth daily.       Allergies: No Known Allergies  Past Medical History:  Diagnosis Date  . Complication of anesthesia    encephalitis in coma 23 days- trach -reversed-no issues now  . Depressed   . High cholesterol   . Hypertension   . Hypothyroidism   . Pre-diabetes   . Sleep  apnea    do not use cpap  . Stroke [facility]) 2015   slower ,but not a problem; right thalamic hemorrhage 07/2013  . Thyroid disease     Past Surgical History:  Procedure Laterality Date  . ABDOMINAL HYSTERECTOMY    . APPENDECTOMY    . c sections    . COLONOSCOPY WITH PROPOFOL N/A 12/30/2014   Procedure: COLONOSCOPY WITH PROPOFOL;  Surgeon: Carol Ada, MD;  Location: [facility];  Service: Endoscopy;  Laterality: N/A;  . PARATHYROIDECTOMY Right 02/18/2017   Procedure: RIGHT INFERIOR PARATHYROIDECTOMY;  Surgeon: Armandina Gemma, MD;  Location: [place];  Service: General;  Laterality: Right;  . TRACHEOSTOMY     past history of early 20's- encephalitis coma for 23 days-1982    No family history on file.  Social History:  reports that she has never smoked. She has never used smokeless tobacco. She reports that she does not drink alcohol or use drugs.  ROS    Hypothyroid since ? 2006 , Apparently received radioactive iodine treatment for hyperthyroidism at that time Her levothyroxine is being prescribed by her PCP but apparently she has not had a follow-up since her labs were done in July She has been on 100 g levothyroxine, usually taking this regularly  Lab Results  Component Value Date   TSH 18.52 (H) 12/02/2016   TSH 1.015 04/21/2015   TSH 2.053 10/26/2014   FREET4 0.39 (L) 12/02/2016   FREET4 1.17 03/03/2014     She is on metformin  for mild diabetes, Followed by PCP  Lab Results  Component Value Date   HGBA1C 6.7 (H) 02/12/2017   HGBA1C 6.6 05/15/2016   HGBA1C 6.5 02/12/2016   Lab Results  Component Value Date   LDLCALC 127 (H) 07/26/2014   CREATININE 0.72 03/04/2017       EXAM:  BP 106/78 (BP Location: Left Arm, Patient Position: Sitting, Cuff Size: Large)   Pulse 96   Temp 98.6 F (37 C) (Oral)   Resp 15   Ht 5\' 1"  (1.549 m)   Wt 239 lb 12.8 oz (108.8 kg)   SpO2 93%   BMI 45.31 kg/m     Assessment/Plan:   HYPERPARATHYROIDISM: She has had successful  parathyroidectomy with removal of 2 parathyroid glands on the right side with the larger one apparently being on the superior pole Calcium is back to 9.6   OSTEOPOROSIS with T score - 3.5 at the spine Risk factors for this are premature menopause  and also hyperparathyroidism Currently she is on alendronate and will continue until about 2021 since she needs further fracture reduction risk    There are no Patient Instructions on file for this visit.   Darrnell Mangiaracina 03/07/2017, 1:33 PM

## 2017-03-17 ENCOUNTER — Encounter: Payer: Self-pay | Admitting: Internal Medicine

## 2017-03-17 ENCOUNTER — Ambulatory Visit (INDEPENDENT_AMBULATORY_CARE_PROVIDER_SITE_OTHER): Payer: Medicare PPO | Admitting: Internal Medicine

## 2017-03-17 VITALS — BP 130/80 | HR 88 | Temp 98.0°F | Ht 61.0 in | Wt 244.4 lb

## 2017-03-17 DIAGNOSIS — E039 Hypothyroidism, unspecified: Secondary | ICD-10-CM | POA: Diagnosis not present

## 2017-03-17 NOTE — Patient Instructions (Signed)
It was so nice to see you!  We will recheck your thyroid level today. I will adjust your medication as needed.  We will see you back in 1-2 months for a diabetes visit.  -Dr. Brett Albino

## 2017-03-18 LAB — TSH: TSH: 2.41 u[IU]/mL (ref 0.450–4.500)

## 2017-03-19 NOTE — Progress Notes (Signed)
   [facility] Phone: [phone]  Subjective:  Morgan Collins is a 61 year old female presenting to clinic for follow-up of her hypothyroidism. She is s/p right inferior parathyroidectomy on 9/4 for hyperparathyroidism. She states she has been doing well since the surgery. She feels well. She feels like she has more energy now than she did before her surgery. She has a good appetite. She wants to get back to walking regularly. She denies constipation or dry skin. Per chart review, TSH was last checked by her endocrinologist on 12/02/16 and was 18.52. Patient states she continued to take her Synthroid 181mcg and did not make any dose adjustments. She has been on 146mcg for a long time now.  ROS: See HPI for pertinent positives and negatives  Past Medical History- HTN, hx stroke, OSA, hyperparathyroidism s/p right inferior parathyroidectomy 02/18/17, controlled T2DM, hx intracranial hemorrhage, osteoporosis, depression, HLD, morbid obesity, Vitamin D deficiency, hypothyroidism.  Family history reviewed for today's visit. No changes.  Social history- patient is a never smoker  Objective: BP 130/80 (BP Location: Left Arm, Patient Position: Sitting, Cuff Size: Large)   Pulse 88   Temp 98 F (36.7 C) (Oral)   Ht 5\' 1"  (1.549 m)   Wt 244 lb 6.4 oz (110.9 kg)   SpO2 97%   BMI 46.18 kg/m  Gen: NAD, alert, cooperative with exam HEENT: NCAT, EOMI, MMM Neck: FROM, supple, well-healing horizontal surgical incision, no thyromegaly or thyroid nodules appreciated. CV: RRR, no murmur Resp: CTABL, no wheezes, normal work of breathing  Assessment/Plan: Hypothyroidism: Taking Synthroid 167mcg daily. Last TSH 12/02/16 was 18. Feeling like she has good energy and not having any symptoms of hypo- or hyperthyroidism. - Recheck TSH - Will make medication adjustment as appropriate - Follow-up in the next 1-2 months to discuss other chronic medical conditions.   Hyman Bible, MD PGY-3

## 2017-03-19 NOTE — Assessment & Plan Note (Signed)
Taking Synthroid 1104mcg daily. Last TSH 12/02/16 was 18. Feeling like she has good energy and not having any symptoms of hypo- or hyperthyroidism. - Recheck TSH - Will make medication adjustment as appropriate - Follow-up in the next 1-2 months to discuss other chronic medical conditions.

## 2017-04-11 ENCOUNTER — Emergency Department (HOSPITAL_COMMUNITY)
Admission: EM | Admit: 2017-04-11 | Discharge: 2017-04-11 | Disposition: A | Discharge status: Home / Self Care | Payer: Medicare PPO | Attending: Emergency Medicine | Admitting: Emergency Medicine

## 2017-04-11 ENCOUNTER — Encounter (HOSPITAL_COMMUNITY): Payer: Self-pay | Admitting: Emergency Medicine

## 2017-04-11 ENCOUNTER — Ambulatory Visit: Payer: Medicare PPO | Admitting: Family Medicine

## 2017-04-11 ENCOUNTER — Ambulatory Visit (HOSPITAL_COMMUNITY)
Admission: EM | Admit: 2017-04-11 | Discharge: 2017-04-11 | Disposition: A | Discharge status: Home / Self Care | Payer: Medicare PPO | Attending: Emergency Medicine | Admitting: Emergency Medicine

## 2017-04-11 ENCOUNTER — Emergency Department (HOSPITAL_BASED_OUTPATIENT_CLINIC_OR_DEPARTMENT_OTHER)
Admit: 2017-04-11 | Discharge: 2017-04-11 | Disposition: A | Discharge status: Home / Self Care | Payer: Medicare PPO | Attending: Emergency Medicine | Admitting: Emergency Medicine

## 2017-04-11 ENCOUNTER — Encounter (HOSPITAL_COMMUNITY): Payer: Self-pay | Admitting: *Deleted

## 2017-04-11 DIAGNOSIS — R609 Edema, unspecified: Secondary | ICD-10-CM

## 2017-04-11 DIAGNOSIS — Z79899 Other long term (current) drug therapy: Secondary | ICD-10-CM | POA: Diagnosis not present

## 2017-04-11 DIAGNOSIS — R600 Localized edema: Secondary | ICD-10-CM | POA: Insufficient documentation

## 2017-04-11 DIAGNOSIS — M791 Myalgia, unspecified site: Secondary | ICD-10-CM

## 2017-04-11 DIAGNOSIS — Z7982 Long term (current) use of aspirin: Secondary | ICD-10-CM | POA: Insufficient documentation

## 2017-04-11 DIAGNOSIS — E039 Hypothyroidism, unspecified: Secondary | ICD-10-CM | POA: Insufficient documentation

## 2017-04-11 DIAGNOSIS — M7989 Other specified soft tissue disorders: Secondary | ICD-10-CM | POA: Diagnosis not present

## 2017-04-11 DIAGNOSIS — I10 Essential (primary) hypertension: Secondary | ICD-10-CM | POA: Insufficient documentation

## 2017-04-11 DIAGNOSIS — M79609 Pain in unspecified limb: Secondary | ICD-10-CM

## 2017-04-11 DIAGNOSIS — Z8673 Personal history of transient ischemic attack (TIA), and cerebral infarction without residual deficits: Secondary | ICD-10-CM | POA: Diagnosis not present

## 2017-04-11 LAB — CBC WITH DIFFERENTIAL/PLATELET
Basophils Absolute: 0 10*3/uL (ref 0.0–0.1)
Basophils Relative: 0 %
Eosinophils Absolute: 0.1 10*3/uL (ref 0.0–0.7)
Eosinophils Relative: 2 %
HCT: 36.6 % (ref 36.0–46.0)
Hemoglobin: 10.8 g/dL — ABNORMAL LOW (ref 12.0–15.0)
Lymphocytes Relative: 31 %
Lymphs Abs: 2.3 10*3/uL (ref 0.7–4.0)
MCH: 24.7 pg — ABNORMAL LOW (ref 26.0–34.0)
MCHC: 29.5 g/dL — ABNORMAL LOW (ref 30.0–36.0)
MCV: 83.8 fL (ref 78.0–100.0)
Monocytes Absolute: 0.5 10*3/uL (ref 0.1–1.0)
Monocytes Relative: 7 %
Neutro Abs: 4.5 10*3/uL (ref 1.7–7.7)
Neutrophils Relative %: 60 %
Platelets: 277 10*3/uL (ref 150–400)
RBC: 4.37 MIL/uL (ref 3.87–5.11)
RDW: 16 % — ABNORMAL HIGH (ref 11.5–15.5)
WBC: 7.5 10*3/uL (ref 4.0–10.5)

## 2017-04-11 LAB — COMPREHENSIVE METABOLIC PANEL
ALT: 50 U/L (ref 14–54)
AST: 26 U/L (ref 15–41)
Albumin: 3.7 g/dL (ref 3.5–5.0)
Alkaline Phosphatase: 68 U/L (ref 38–126)
Anion gap: 9 (ref 5–15)
BUN: 15 mg/dL (ref 6–20)
CO2: 25 mmol/L (ref 22–32)
Calcium: 9.2 mg/dL (ref 8.9–10.3)
Chloride: 105 mmol/L (ref 101–111)
Creatinine, Ser: 0.88 mg/dL (ref 0.44–1.00)
GFR calc Af Amer: >60 mL/min (ref >60)
GFR calc non Af Amer: >60 mL/min (ref >60)
Glucose, Bld: 132 mg/dL — ABNORMAL HIGH (ref 65–99)
Potassium: 3.9 mmol/L (ref 3.5–5.1)
Sodium: 139 mmol/L (ref 135–145)
Total Bilirubin: 0.5 mg/dL (ref 0.3–1.2)
Total Protein: 7.5 g/dL (ref 6.5–8.1)

## 2017-04-11 MED ORDER — HYDROCODONE-ACETAMINOPHEN 5-325 MG PO TABS
1.0000 | ORAL_TABLET | Freq: Once | ORAL | Status: AC
  Administered 2017-04-11: 1 via ORAL
  Filled 2017-04-11: qty 1

## 2017-04-11 NOTE — Discharge Instructions (Signed)
Decrease sodium in your diet, increase exercise and wear TED hose.

## 2017-04-11 NOTE — ED Triage Notes (Signed)
Pt c/o R leg swelling worsening the last few days, pt c/o pain onset today, pt reports generalized swelling, with joint pain with hands, pt sent here from UC to r/o blood clot in R lower leg, pt A&O x4

## 2017-04-11 NOTE — ED Provider Notes (Signed)
[facility] EMERGENCY DEPARTMENT Provider Note   CSN: [id number] Arrival date & time: 04/11/17  1842     History   Chief Complaint Chief Complaint  Patient presents with  . Leg Swelling    HPI Morgan Collins is a 61 y.o. female.  Patient presents with diffuse swelling bilateral arms and legs.  Patient has no hemoptysis, no blood clot history, no recent major surgery, no unilateral swelling, no shortness of breath or chest pain.  Patient denies any liver or kidney disease.  Patient is urinating normal.  Patient does eat excessive salt in her diet.      Past Medical History:  Diagnosis Date  . Complication of anesthesia    encephalitis in coma 23 days- trach -reversed-no issues now  . Depressed   . High cholesterol   . Hypertension   . Hypothyroidism   . Pre-diabetes   . Sleep apnea    do not use cpap  . Stroke [facility]) 2015   slower ,but not a problem; right thalamic hemorrhage 07/2013  . Thyroid disease     Patient Active Problem List   Diagnosis Date Noted  . Health care maintenance 02/12/2016  . OSA (obstructive sleep apnea) 09/19/2015  . Type 2 diabetes mellitus, controlled ([place]) 03/23/2015  . Hyperparathyroidism, primary ([place]) 02/09/2015  . Vitamin D deficiency 02/09/2015  . Postsurgical menopause 02/09/2015  . Osteoporosis 12/28/2014  . Depression 10/25/2014  . Nontraumatic subcortical hemorrhage of cerebral hemisphere ([place]) 03/08/2014  . HLD (hyperlipidemia) 03/08/2014  . Essential hypertension 03/08/2014  . Obesities, morbid ([place]) 11/22/2013  . Hypothyroidism 08/18/2013  . Thalamic hemorrhage ([place]) 08/04/2013  . Stroke ([place]) 07/27/2013  . ICH (intracerebral hemorrhage) ([place]) 07/27/2013  . Obstructive hydrocephalus 07/27/2013    Past Surgical History:  Procedure Laterality Date  . ABDOMINAL HYSTERECTOMY    . APPENDECTOMY    . c sections    . COLONOSCOPY WITH PROPOFOL N/A 12/30/2014   Procedure: COLONOSCOPY WITH PROPOFOL;   Surgeon: Carol Ada, MD;  Location: [facility];  Service: Endoscopy;  Laterality: N/A;  . PARATHYROIDECTOMY Right 02/18/2017   Procedure: RIGHT INFERIOR PARATHYROIDECTOMY;  Surgeon: Armandina Gemma, MD;  Location: [facility];  Service: General;  Laterality: Right;  . TRACHEOSTOMY     past history of early 20's- encephalitis coma for 23 days-1982    OB History    No data available       Home Medications    Prior to Admission medications   Medication Sig Start Date End Date Taking? Authorizing Provider  alendronate (FOSAMAX) 70 MG tablet Take 1 tablet (70 mg total) by mouth every 7 (seven) days. Take with a full glass of water on an empty stomach. Patient taking differently: Take 70 mg by mouth once a week. Take with a full glass of water on an empty stomach. On Saturday 01/30/17  Yes Mayo, Pete Pelt, MD  amLODipine (NORVASC) 10 MG tablet Take 1 tablet (10 mg total) by mouth daily. 01/30/17  Yes Mayo, Pete Pelt, MD  aspirin EC 81 MG tablet Take 1 tablet (81 mg total) by mouth daily. 01/30/17  Yes Mayo, Pete Pelt, MD  atorvastatin (LIPITOR) 80 MG tablet Take 1 tablet (80 mg total) by mouth daily. Patient taking differently: Take 80 mg by mouth daily at 6 PM.  01/30/17  Yes Mayo, Pete Pelt, MD  citalopram (CELEXA) 20 MG tablet Take 20 mg by mouth every evening.   Yes [provider]  COD LIVER OIL PO Take 1 capsule  by mouth daily.   Yes [provider]  Cyanocobalamin (B-12 PO) Take 1 tablet by mouth daily.   Yes [provider]  levothyroxine (SYNTHROID, LEVOTHROID) 100 MCG tablet Take 1 tablet (100 mcg total) by mouth daily. 01/30/17  Yes Mayo, Pete Pelt, MD  losartan (COZAAR) 100 MG tablet Take 1 tablet (100 mg total) by mouth daily. 01/30/17  Yes Mayo, Pete Pelt, MD  metFORMIN (GLUCOPHAGE) 500 MG tablet TAKE 1 TABLET TWICE DAILY WITH A MEAL Patient taking differently: Take 500 mg by mouth 2 (two) times daily with a meal.  01/30/17  Yes Mayo, Pete Pelt, MD  Omega-3 Fatty  Acids (OMEGA 3 PO) Take 1 capsule by mouth daily.   Yes [provider]  VITAMIN D, ERGOCALCIFEROL, PO Take 2 tablets by mouth daily.    Yes [provider]    Family History No family history on file.  Social History Social History  Substance Use Topics  . Smoking status: Never Smoker  . Smokeless tobacco: Never Used  . Alcohol use No     Allergies   Patient has no known allergies.   Review of Systems Review of Systems  Constitutional: Negative for chills and fever.  HENT: Negative for congestion.   Eyes: Negative for visual disturbance.  Respiratory: Negative for shortness of breath.   Cardiovascular: Positive for leg swelling. Negative for chest pain.  Gastrointestinal: Negative for abdominal pain and vomiting.  Genitourinary: Negative for dysuria and flank pain.  Musculoskeletal: Negative for back pain, neck pain and neck stiffness.  Skin: Negative for rash.  Neurological: Negative for light-headedness and headaches.     Physical Exam Updated Vital Signs BP (!) 130/97 (BP Location: Right Arm)   Pulse 84   Temp 97.7 F (36.5 C) (Oral)   Resp 14   SpO2 100%   Physical Exam  Constitutional: She appears well-developed and well-nourished. No distress.  HENT:  Head: Normocephalic and atraumatic.  Eyes: Conjunctivae are normal.  Neck: Neck supple.  Cardiovascular: Normal rate and regular rhythm.   Pulmonary/Chest: Effort normal and breath sounds normal. No respiratory distress.  Abdominal: Soft. There is no tenderness.  Musculoskeletal: She exhibits edema (all extremties mild).  Neurological: She is alert.  Skin: Skin is warm and dry.  Psychiatric: She has a normal mood and affect.  Nursing note and vitals reviewed.    ED Treatments / Results  Labs (all labs ordered are listed, but only abnormal results are displayed) Labs Reviewed  CBC WITH DIFFERENTIAL/PLATELET - Abnormal; Notable for the following:       Result Value   Hemoglobin  10.8 (*)    MCH 24.7 (*)    MCHC 29.5 (*)    RDW 16.0 (*)    All other components within normal limits  COMPREHENSIVE METABOLIC PANEL - Abnormal; Notable for the following:    Glucose, Bld 132 (*)    All other components within normal limits    EKG  EKG Interpretation None       Radiology No results found.  Procedures Procedures (including critical care time)  Medications Ordered in ED Medications - No data to display   Initial Impression / Assessment and Plan / ED Course  I have reviewed the triage vital signs and the nursing notes.  Pertinent labs & imaging results that were available during my care of the patient were reviewed by me and considered in my medical decision making (see chart for details).     Patient presents with diffuse edema.  Discussed supportive care including exercise, decrease salt, TED hose and outpatient follow-up.  Kidney function okay patient urinating normally.  Patient denies classic blood clot risk factors.  Patient did have an ultrasound done in triage which is unremarkable.  Results and differential diagnosis were discussed with the patient/parent/guardian. Xrays were independently reviewed by myself.  Close follow up outpatient was discussed, comfortable with the plan.   Medications  HYDROcodone-acetaminophen (NORCO/VICODIN) 5-325 MG per tablet 1 tablet (not administered)    Vitals:   04/11/17 1856  BP: (!) 130/97  Pulse: 84  Resp: 14  Temp: 97.7 F (36.5 C)  TempSrc: Oral  SpO2: 100%    Final diagnoses:  Peripheral edema     Final Clinical Impressions(s) / ED Diagnoses   Final diagnoses:  Peripheral edema    New Prescriptions New Prescriptions   No medications on file     Elnora Morrison, MD 04/11/17 2219

## 2017-04-11 NOTE — ED Triage Notes (Addendum)
Pt c/o swelling on bilateral hand/foot onset 2 weeks associated w/pain  Denies inj/trauma  Reports thyroidectomy on 02/18/17  Hx of HTN, stroke, DM, Thyroid  Reports she's taking 3,000 mg of acetaminophen (500mg ) daily but has cut back to 2,000 mg   A&O x4... NAD... Ambulatory

## 2017-04-11 NOTE — Progress Notes (Signed)
**  Preliminary report by tech**  Right lower extremity venous duplex complete. There is no obvious evidence of deep or superficial vein thrombosis involving the right lower extremity. All clearly visualized vessels appear patent and compressible. There is no evidence of a Baker's cyst on the right. Results were given to Dr. Reather Converse.  04/11/17 7:47 PM Carlos Levering RVT

## 2017-04-11 NOTE — ED Provider Notes (Signed)
HPI  SUBJECTIVE:  Morgan Collins is a 61 y.o. female who presents with right sided myalgias, arthralgias for the past 2 weeks worse on the right than the left, and hand, leg swelling.  Symptoms seem to be primarily in the right hand and leg. She describes the pain in her leg as dull, throbbing, occasionally sharp, and constant.  No calf swelling, hemoptysis, immobilization, surgery in the past 4 weeks.  No unintentional weight gain, nocturia.  She reports some coughing and wheezing at night.  No shortness of breath, no dyspnea on exertion, PND, orthopnea.  No tick bite.  No fevers.  No erythema, edema of any of the joints.  Denies urinary complaints, tea colored urine, abdominal pain, nausea, vomiting.  No change in her medicines, no change in her physical activity. she does admit to adding salt to her food.  She tried compression stockings, 1500 mg of Tylenol twice daily and massage without improvement in her symptoms.  There are no other aggravating or alleviating factors.  Past medical history of stroke, hyperparathyroidism status post parathyroid removal approximately 6 weeks ago.  OSA not using CPAP, hypertension, prediabetes.  No history of kidney disease, CHF, DVT, PE, cancer.PMD: Mayo, Pete Pelt, MD    Past Medical History:  Diagnosis Date  . Complication of anesthesia    encephalitis in coma 23 days- trach -reversed-no issues now  . Depressed   . High cholesterol   . Hypertension   . Hypothyroidism   . Pre-diabetes   . Sleep apnea    do not use cpap  . Stroke [facility]) 2015   slower ,but not a problem; right thalamic hemorrhage 07/2013  . Thyroid disease     Past Surgical History:  Procedure Laterality Date  . ABDOMINAL HYSTERECTOMY    . APPENDECTOMY    . c sections    . COLONOSCOPY WITH PROPOFOL N/A 12/30/2014   Procedure: COLONOSCOPY WITH PROPOFOL;  Surgeon: Carol Ada, MD;  Location: [facility];  Service: Endoscopy;  Laterality: N/A;  . PARATHYROIDECTOMY Right 02/18/2017   Procedure: RIGHT INFERIOR PARATHYROIDECTOMY;  Surgeon: Armandina Gemma, MD;  Location: [facility];  Service: General;  Laterality: Right;  . TRACHEOSTOMY     past history of early 20's- encephalitis coma for 23 days-1982    History reviewed. No pertinent family history.  Social History  Substance Use Topics  . Smoking status: Never Smoker  . Smokeless tobacco: Never Used  . Alcohol use No    No current facility-administered medications for this encounter.   Current Outpatient Prescriptions:  .  alendronate (FOSAMAX) 70 MG tablet, Take 1 tablet (70 mg total) by mouth every 7 (seven) days. Take with a full glass of water on an empty stomach. (Patient taking differently: Take 70 mg by mouth once a week. Take with a full glass of water on an empty stomach. On Saturday), Disp: 12 tablet, Rfl: 1 .  amLODipine (NORVASC) 10 MG tablet, Take 1 tablet (10 mg total) by mouth daily., Disp: 90 tablet, Rfl: 0 .  aspirin EC 81 MG tablet, Take 1 tablet (81 mg total) by mouth daily., Disp: 90 tablet, Rfl: 3 .  atorvastatin (LIPITOR) 80 MG tablet, Take 1 tablet (80 mg total) by mouth daily. (Patient taking differently: Take 80 mg by mouth daily at 6 PM. ), Disp: 90 tablet, Rfl: 0 .  citalopram (CELEXA) 20 MG tablet, Take 20 mg by mouth every evening., Disp: , Rfl:  .  COD LIVER OIL PO, Take 1 capsule by mouth  daily., Disp: , Rfl:  .  Cyanocobalamin (B-12 PO), Take 1 tablet by mouth daily., Disp: , Rfl:  .  levothyroxine (SYNTHROID, LEVOTHROID) 100 MCG tablet, Take 1 tablet (100 mcg total) by mouth daily., Disp: 90 tablet, Rfl: 0 .  losartan (COZAAR) 100 MG tablet, Take 1 tablet (100 mg total) by mouth daily., Disp: 90 tablet, Rfl: 0 .  metFORMIN (GLUCOPHAGE) 500 MG tablet, TAKE 1 TABLET TWICE DAILY WITH A MEAL (Patient taking differently: Take 500 mg by mouth 2 (two) times daily with a meal. ), Disp: 180 tablet, Rfl: 0 .  Omega-3 Fatty Acids (OMEGA 3 PO), Take 1 capsule by mouth daily., Disp: , Rfl:  .  VITAMIN D,  ERGOCALCIFEROL, PO, Take 2 tablets by mouth daily. , Disp: , Rfl:   No Known Allergies   ROS  As noted in HPI.   Physical Exam  BP (!) 138/94 (BP Location: Right Arm)   Pulse 87   Temp 97.9 F (36.6 C) (Oral)   Resp 20   SpO2 95%   Constitutional: Well developed, well nourished, no acute distress.  Obese Eyes: PERRL, EOMI, conjunctiva normal bilaterally HENT: Normocephalic, atraumatic,mucus membranes moist Respiratory: Clear to auscultation bilaterally, no rales, no wheezing, no rhonchi Cardiovascular: Normal rate and rhythm, no murmurs, no gallops, no rubs GI: Soft, nondistended, normal bowel sounds, nontender, no rebound, no guarding Back: no CVAT skin: No rash, skin intact Musculoskeletal: Trace lower extremity edema bilaterally, calves symmetric.  Positive right medial calf and medial thigh tenderness.  Positive nontender varicose veins over thigh.  No popliteal tenderness.  No appreciable cord. No erythema lower extremity.  Positive bilateral hand swelling on the right more so than the left, no deformities.  No joint erythema, edema. Neurologic: Alert & oriented x 3, CN II-XII grossly intact, no motor deficits, sensation grossly intact Psychiatric: Speech and behavior appropriate   ED Course   Medications - No data to display  No orders of the defined types were placed in this encounter.  No results found for this or any previous visit (from the past 24 hour(s)). No results found.  ED Clinical Impression  Myalgia  Leg swelling  Swelling of right hand   ED Assessment/Plan  Given the medial right calf and thigh tenderness, DVT is in the differential.  I am unable to explain the edema and pain in her hands, this could be from arthritis.  Discussed with patient that I could do very limited workup here in the urgent care but that I could not rule out life-threatening causes of her leg pain and swelling.  Patient has agreed to go to the emergency department.  Feel  that she is stable to go by private vehicle.  No orders of the defined types were placed in this encounter.   *This clinic note was created using Dragon dictation software. Therefore, there may be occasional mistakes despite careful proofreading.  ?    Melynda Ripple, MD 04/11/17 1919

## 2017-04-11 NOTE — ED Notes (Signed)
Pt given discharge paperwork, verbalized understanding. Pt ambulatory on D/C.

## 2017-04-15 ENCOUNTER — Encounter: Payer: Self-pay | Admitting: Internal Medicine

## 2017-04-15 ENCOUNTER — Ambulatory Visit (INDEPENDENT_AMBULATORY_CARE_PROVIDER_SITE_OTHER): Payer: Medicare PPO | Admitting: Internal Medicine

## 2017-04-15 ENCOUNTER — Encounter: Payer: Self-pay | Admitting: *Deleted

## 2017-04-15 ENCOUNTER — Ambulatory Visit (INDEPENDENT_AMBULATORY_CARE_PROVIDER_SITE_OTHER): Payer: Medicare PPO | Admitting: *Deleted

## 2017-04-15 VITALS — BP 126/92 | HR 87 | Temp 97.9°F | Ht 61.0 in | Wt 252.0 lb

## 2017-04-15 VITALS — BP 124/82 | HR 99 | Temp 98.8°F | Ht 61.0 in | Wt 252.0 lb

## 2017-04-15 DIAGNOSIS — Z1231 Encounter for screening mammogram for malignant neoplasm of breast: Secondary | ICD-10-CM | POA: Diagnosis not present

## 2017-04-15 DIAGNOSIS — R0609 Other forms of dyspnea: Secondary | ICD-10-CM | POA: Diagnosis not present

## 2017-04-15 DIAGNOSIS — R600 Localized edema: Secondary | ICD-10-CM | POA: Insufficient documentation

## 2017-04-15 DIAGNOSIS — R0600 Dyspnea, unspecified: Secondary | ICD-10-CM

## 2017-04-15 DIAGNOSIS — G4733 Obstructive sleep apnea (adult) (pediatric): Secondary | ICD-10-CM

## 2017-04-15 DIAGNOSIS — Z0000 Encounter for general adult medical examination without abnormal findings: Secondary | ICD-10-CM

## 2017-04-15 MED ORDER — FUROSEMIDE 20 MG PO TABS: 20.0000 mg | ORAL_TABLET | Freq: Every day | ORAL | 3 refills | Status: DC

## 2017-04-15 MED ORDER — CITALOPRAM HYDROBROMIDE 20 MG PO TABS: 20.0000 mg | ORAL_TABLET | Freq: Every evening | ORAL | 0 refills | Status: DC

## 2017-04-15 NOTE — Assessment & Plan Note (Signed)
Patient had sleep study in 11/2015 showing moderate OSA, but never got a CPAP. Unable to get CPAP now, because sleep study was > 1 year ago. - Will obtain another sleep study so patient can obtain CPAP.

## 2017-04-15 NOTE — Progress Notes (Signed)
   [facility] Phone: [phone]  Subjective:  Morgan Collins is a 61 year old female presenting to clinic with swelling of her hands and legs for the last 2 weeks. Two weeks ago, she started doing a lot of cooking at home. She admits to using a lot of salt in her cooking. She states she has gradually worsening lower extremity edema and dyspnea on exertion. She is unsure if she has had orthopnea, because she always sleeps in a reclined position. Her weight is up 8lbs in 1 month. She has been urinating like normal. She was seen in the ED on 10/26. She had a RLE duplex US ordered, which was negative for DVT. Labs were unremarkable. She was advised to exercise, decrease salt in her diet, use TED hose, and follow-up in our clinic. She states she has decreased her salt intake, but her edema is still present.  Patient is also concerned about her OSA. She had a sleep study in 11/2015 that showed moderate OSA. They recommended a trial of CPAP. However, she never got this. She tried to get a CPAP recently, but was told that she could no longer get this because her sleep study was > 1 year ago.  ROS: See HPI for pertinent positives and negatives  Past Medical History- HTN, hx stroke, hypothyroidism, hx hyperparathyroidism s/p parathyroidectomy, osteoporosis, obesity, HLD, depression.  Family history reviewed for today's visit. No changes.  Social history- patient is a never smoker  Objective: BP 124/82   Pulse 99   Temp 98.8 F (37.1 C) (Oral)   Ht 5\' 1"  (1.549 m)   Wt 252 lb (114.3 kg)   SpO2 97%   BMI 47.61 kg/m  Gen: NAD, alert, cooperative with exam HEENT: NCAT, EOMI, MMM Neck: FROM, supple, no JVD CV: RRR, no murmur Resp: CTABL, no wheezes or crackles, normal work of breathing GI: SNTND, BS present, no guarding or organomegaly Msk: 2+ symmetric pitting edema to the mid shin bilaterally, non-pitting edema also present in the hands, extremities are warm and  well-perfused.  Assessment/Plan: Lower Extremity Edema: Also with associated DOE and 8lb weight gain in the last week. Likely related to increase salt intake over the last 2 weeks. No crackles on exam and O2 is 97%. Patient had recent CBC, CMP, and TSH that were all unremarkable. Urinating like normal. - Would have liked to obtain UA today for completeness sake to rule out nephrotic syndrome, but patient unable to void. - Recommended continued conservative measures with low salt diet, compression hose, elevating the legs. - Because she has already tried these things and her edema has persisted, will treat with Lasix 20mg  PO x 7 days - Will obtain ECHO to assess for CHF. She has never had an ECHO before. - Follow-up in 1 week. Will check BMP and weight at that visit  Moderate OSA: Patient had sleep study in 11/2015 showing moderate OSA, but never got a CPAP. Unable to get CPAP now, because sleep study was > 1 year ago. - Will obtain another sleep study so patient can obtain CPAP.   Hyman Bible, MD PGY-3

## 2017-04-15 NOTE — Assessment & Plan Note (Signed)
Also with associated DOE and 8lb weight gain in the last week. Likely related to increase salt intake over the last 2 weeks. No crackles on exam and O2 is 97%. Patient had recent CBC, CMP, and TSH that were all unremarkable. Urinating like normal. - Would have liked to obtain UA today for completeness sake to rule out nephrotic syndrome, but patient unable to void. - Recommended continued conservative measures with low salt diet, compression hose, elevating the legs. - Because she has already tried these things and her edema has persisted, will treat with Lasix 20mg  PO x 7 days - Will obtain ECHO to assess for CHF. She has never had an ECHO before. - Follow-up in 1 week. Will check BMP and weight at that visit

## 2017-04-15 NOTE — Patient Instructions (Addendum)
It was so nice to see you!  I have prescribed a medication called Lasix. Please take 1 tablet daily for the next 7 days. We will see you back in clinic next week to see how your swelling is doing.  For your sleep apnea, I have placed an order for a new sleep study. Their office will call you to get this scheduled.  -Dr. Brett Albino

## 2017-04-15 NOTE — Patient Instructions (Addendum)
Ms. Servidio,  Thank you for taking time to come for yourMedicare Wellness Visit. I appreciate your ongoing commitment to your health goals. Please review the following plan we discussed and let me know if I can assist you in the future.   These are the goals we discussed:  Goals    . Blood Pressure < 140/90    . Exercise 3x per week (30 min per time) (pt-stated)          [employer] Silver sneakers    . LDL CALC < 100    . Prioritizing self before taking care of others (pt-stated)    . Weight (lb) < 234 lb (106.1 kg) (pt-stated)          7% weight loss        Diabetes and Foot Care Diabetes may cause you to have problems because of poor blood supply (circulation) to your feet and legs. This may cause the skin on your feet to become thinner, break easier, and heal more slowly. Your skin may become dry, and the skin may peel and crack. You may also have nerve damage in your legs and feet causing decreased feeling in them. You may not notice minor injuries to your feet that could lead to infections or more serious problems. Taking care of your feet is one of the most important things you can do for yourself. Follow these instructions at home:  Wear shoes at all times, even in the house. Do not go barefoot. Bare feet are easily injured.  Check your feet daily for blisters, cuts, and redness. If you cannot see the bottom of your feet, use a mirror or ask someone for help.  Wash your feet with warm water (do not use hot water) and mild soap. Then pat your feet and the areas between your toes until they are completely dry. Do not soak your feet as this can dry your skin.  Apply a moisturizing lotion or petroleum jelly (that does not contain alcohol and is unscented) to the skin on your feet and to dry, brittle toenails. Do not apply lotion between your toes.  Trim your toenails straight across. Do not dig under them or around the cuticle. File the edges of your nails with an emery board or  nail file.  Do not cut corns or calluses or try to remove them with medicine.  Wear clean socks or stockings every day. Make sure they are not too tight. Do not wear knee-high stockings since they may decrease blood flow to your legs.  Wear shoes that fit properly and have enough cushioning. To break in new shoes, wear them for just a few hours a day. This prevents you from injuring your feet. Always look in your shoes before you put them on to be sure there are no objects inside.  Do not cross your legs. This may decrease the blood flow to your feet.  If you find a minor scrape, cut, or break in the skin on your feet, keep it and the skin around it clean and dry. These areas may be cleansed with mild soap and water. Do not cleanse the area with peroxide, alcohol, or iodine.  When you remove an adhesive bandage, be sure not to damage the skin around it.  If you have a wound, look at it several times a day to make sure it is healing.  Do not use heating pads or hot water bottles. They may burn your skin. If you have lost  feeling in your feet or legs, you may not know it is happening until it is too late.  Make sure your health care provider performs a complete foot exam at least annually or more often if you have foot problems. Report any cuts, sores, or bruises to your health care provider immediately. Contact a health care provider if:  You have an injury that is not healing.  You have cuts or breaks in the skin.  You have an ingrown nail.  You notice redness on your legs or feet.  You feel burning or tingling in your legs or feet.  You have pain or cramps in your legs and feet.  Your legs or feet are numb.  Your feet always feel cold. Get help right away if:  There is increasing redness, swelling, or pain in or around a wound.  There is a red line that goes up your leg.  Pus is coming from a wound.  You develop a fever or as directed by your health care provider.  You  notice a bad smell coming from an ulcer or wound. This information is not intended to replace advice given to you by your health care provider. Make sure you discuss any questions you have with your health care provider. Document Released: 05/31/2000 Document Revised: 11/09/2015 Document Reviewed: 11/10/2012 Elsevier Interactive Patient Education  2017 [place] Prevention in the Home Falls can cause injuries. They can happen to people of all ages. There are many things you can do to make your home safe and to help prevent falls. What can I do on the outside of my home?  Regularly fix the edges of walkways and driveways and fix any cracks.  Remove anything that might make you trip as you walk through a door, such as a raised step or threshold.  Trim any bushes or trees on the path to your home.  Use bright outdoor lighting.  Clear any walking paths of anything that might make someone trip, such as rocks or tools.  Regularly check to see if handrails are loose or broken. Make sure that both sides of any steps have handrails.  Any raised decks and porches should have guardrails on the edges.  Have any leaves, snow, or ice cleared regularly.  Use sand or salt on walking paths during winter.  Clean up any spills in your garage right away. This includes oil or grease spills. What can I do in the bathroom?  Use night lights.  Install grab bars by the toilet and in the tub and shower. Do not use towel bars as grab bars.  Use non-skid mats or decals in the tub or shower.  If you need to sit down in the shower, use a plastic, non-slip stool.  Keep the floor dry. Clean up any water that spills on the floor as soon as it happens.  Remove soap buildup in the tub or shower regularly.  Attach bath mats securely with double-sided non-slip rug tape.  Do not have throw rugs and other things on the floor that can make you trip. What can I do in the bedroom?  Use night  lights.  Make sure that you have a light by your bed that is easy to reach.  Do not use any sheets or blankets that are too big for your bed. They should not hang down onto the floor.  Have a firm chair that has side arms. You can use this for support while you get  dressed.  Do not have throw rugs and other things on the floor that can make you trip. What can I do in the kitchen?  Clean up any spills right away.  Avoid walking on wet floors.  Keep items that you use a lot in easy-to-reach places.  If you need to reach something above you, use a strong step stool that has a grab bar.  Keep electrical cords out of the way.  Do not use floor polish or wax that makes floors slippery. If you must use wax, use non-skid floor wax.  Do not have throw rugs and other things on the floor that can make you trip. What can I do with my stairs?  Do not leave any items on the stairs.  Make sure that there are handrails on both sides of the stairs and use them. Fix handrails that are broken or loose. Make sure that handrails are as long as the stairways.  Check any carpeting to make sure that it is firmly attached to the stairs. Fix any carpet that is loose or worn.  Avoid having throw rugs at the top or bottom of the stairs. If you do have throw rugs, attach them to the floor with carpet tape.  Make sure that you have a light switch at the top of the stairs and the bottom of the stairs. If you do not have them, ask someone to add them for you. What else can I do to help prevent falls?  Wear shoes that: ? Do not have high heels. ? Have rubber bottoms. ? Are comfortable and fit you well. ? Are closed at the toe. Do not wear sandals.  If you use a stepladder: ? Make sure that it is fully opened. Do not climb a closed stepladder. ? Make sure that both sides of the stepladder are locked into place. ? Ask someone to hold it for you, if possible.  Clearly mark and make sure that you can  see: ? Any grab bars or handrails. ? First and last steps. ? Where the edge of each step is.  Use tools that help you move around (mobility aids) if they are needed. These include: ? Canes. ? Walkers. ? Scooters. ? Crutches.  Turn on the lights when you go into a dark area. Replace any light bulbs as soon as they burn out.  Set up your furniture so you have a clear path. Avoid moving your furniture around.  If any of your floors are uneven, fix them.  If there are any pets around you, be aware of where they are.  Review your medicines with your doctor. Some medicines can make you feel dizzy. This can increase your chance of falling. Ask your doctor what other things that you can do to help prevent falls. This information is not intended to replace advice given to you by your health care provider. Make sure you discuss any questions you have with your health care provider. Document Released: 03/30/2009 Document Revised: 11/09/2015 Document Reviewed: 07/08/2014 Elsevier Interactive Patient Education  2018 [place] Maintenance, Female Adopting a healthy lifestyle and getting preventive care can go a long way to promote health and wellness. Talk with your health care provider about what schedule of regular examinations is right for you. This is a good chance for you to check in with your provider about disease prevention and staying healthy. In between checkups, there are plenty of things you can do on your own. Experts have  done a lot of research about which lifestyle changes and preventive measures are most likely to keep you healthy. Ask your health care provider for more information. Weight and diet Eat a healthy diet  Be sure to include plenty of vegetables, fruits, low-fat dairy products, and lean protein.  Do not eat a lot of foods high in solid fats, added sugars, or salt.  Get regular exercise. This is one of the most important things you can do for your  health. ? Most adults should exercise for at least 150 minutes each week. The exercise should increase your heart rate and make you sweat (moderate-intensity exercise). ? Most adults should also do strengthening exercises at least twice a week. This is in addition to the moderate-intensity exercise.  Maintain a healthy weight  Body mass index (BMI) is a measurement that can be used to identify possible weight problems. It estimates body fat based on height and weight. Your health care provider can help determine your BMI and help you achieve or maintain a healthy weight.  For females 65 years of age and older: ? A BMI below 18.5 is considered underweight. ? A BMI of 18.5 to 24.9 is normal. ? A BMI of 25 to 29.9 is considered overweight. ? A BMI of 30 and above is considered obese.  Watch levels of cholesterol and blood lipids  You should start having your blood tested for lipids and cholesterol at 61 years of age, then have this test every 5 years.  You may need to have your cholesterol levels checked more often if: ? Your lipid or cholesterol levels are high. ? You are older than 61 years of age. ? You are at high risk for heart disease.  Cancer screening Lung Cancer  Lung cancer screening is recommended for adults 30-87 years old who are at high risk for lung cancer because of a history of smoking.  A yearly low-dose CT scan of the lungs is recommended for people who: ? Currently smoke. ? Have quit within the past 15 years. ? Have at least a 30-pack-year history of smoking. A pack year is smoking an average of one pack of cigarettes a day for 1 year.  Yearly screening should continue until it has been 15 years since you quit.  Yearly screening should stop if you develop a health problem that would prevent you from having lung cancer treatment.  Breast Cancer  Practice breast self-awareness. This means understanding how your breasts normally appear and feel.  It also means  doing regular breast self-exams. Let your health care provider know about any changes, no matter how small.  If you are in your 20s or 30s, you should have a clinical breast exam (CBE) by a health care provider every 1-3 years as part of a regular health exam.  If you are 71 or older, have a CBE every year. Also consider having a breast X-ray (mammogram) every year.  If you have a family history of breast cancer, talk to your health care provider about genetic screening.  If you are at high risk for breast cancer, talk to your health care provider about having an MRI and a mammogram every year.  Breast cancer gene (BRCA) assessment is recommended for women who have family members with BRCA-related cancers. BRCA-related cancers include: ? Breast. ? Ovarian. ? Tubal. ? Peritoneal cancers.  Results of the assessment will determine the need for genetic counseling and BRCA1 and BRCA2 testing.  Cervical Cancer Your health care provider  may recommend that you be screened regularly for cancer of the pelvic organs (ovaries, uterus, and vagina). This screening involves a pelvic examination, including checking for microscopic changes to the surface of your cervix (Pap test). You may be encouraged to have this screening done every 3 years, beginning at age 24.  For women ages 67-65, health care providers may recommend pelvic exams and Pap testing every 3 years, or they may recommend the Pap and pelvic exam, combined with testing for human papilloma virus (HPV), every 5 years. Some types of HPV increase your risk of cervical cancer. Testing for HPV may also be done on women of any age with unclear Pap test results.  Other health care providers may not recommend any screening for nonpregnant women who are considered low risk for pelvic cancer and who do not have symptoms. Ask your health care provider if a screening pelvic exam is right for you.  If you have had past treatment for cervical cancer or a  condition that could lead to cancer, you need Pap tests and screening for cancer for at least 20 years after your treatment. If Pap tests have been discontinued, your risk factors (such as having a new sexual partner) need to be reassessed to determine if screening should resume. Some women have medical problems that increase the chance of getting cervical cancer. In these cases, your health care provider may recommend more frequent screening and Pap tests.  Colorectal Cancer  This type of cancer can be detected and often prevented.  Routine colorectal cancer screening usually begins at 61 years of age and continues through 61 years of age.  Your health care provider may recommend screening at an earlier age if you have risk factors for colon cancer.  Your health care provider may also recommend using home test kits to check for hidden blood in the stool.  A small camera at the end of a tube can be used to examine your colon directly (sigmoidoscopy or colonoscopy). This is done to check for the earliest forms of colorectal cancer.  Routine screening usually begins at age 40.  Direct examination of the colon should be repeated every 5-10 years through 61 years of age. However, you may need to be screened more often if early forms of precancerous polyps or small growths are found.  Skin Cancer  Check your skin from head to toe regularly.  Tell your health care provider about any new moles or changes in moles, especially if there is a change in a mole's shape or color.  Also tell your health care provider if you have a mole that is larger than the size of a pencil eraser.  Always use sunscreen. Apply sunscreen liberally and repeatedly throughout the day.  Protect yourself by wearing long sleeves, pants, a wide-brimmed hat, and sunglasses whenever you are outside.  Heart disease, diabetes, and high blood pressure  High blood pressure causes heart disease and increases the risk of stroke.  High blood pressure is more likely to develop in: ? People who have blood pressure in the high end of the normal range (130-139/85-89 mm Hg). ? People who are overweight or obese. ? People who are African American.  If you are 53-46 years of age, have your blood pressure checked every 3-5 years. If you are 11 years of age or older, have your blood pressure checked every year. You should have your blood pressure measured twice-once when you are at a hospital or clinic, and once when you  are not at a hospital or clinic. Record the average of the two measurements. To check your blood pressure when you are not at a hospital or clinic, you can use: ? An automated blood pressure machine at a pharmacy. ? A home blood pressure monitor.  If you are between 67 years and 70 years old, ask your health care provider if you should take aspirin to prevent strokes.  Have regular diabetes screenings. This involves taking a blood sample to check your fasting blood sugar level. ? If you are at a normal weight and have a low risk for diabetes, have this test once every three years after 61 years of age. ? If you are overweight and have a high risk for diabetes, consider being tested at a younger age or more often. Preventing infection Hepatitis B  If you have a higher risk for hepatitis B, you should be screened for this virus. You are considered at high risk for hepatitis B if: ? You were born in a country where hepatitis B is common. Ask your health care provider which countries are considered high risk. ? Your parents were born in a high-risk country, and you have not been immunized against hepatitis B (hepatitis B vaccine). ? You have HIV or AIDS. ? You use needles to inject street drugs. ? You live with someone who has hepatitis B. ? You have had sex with someone who has hepatitis B. ? You get hemodialysis treatment. ? You take certain medicines for conditions, including cancer, organ transplantation, and  autoimmune conditions.  Hepatitis C  Blood testing is recommended for: ? Everyone born from [birth year] through 1965. ? Anyone with known risk factors for hepatitis C.  Sexually transmitted infections (STIs)  You should be screened for sexually transmitted infections (STIs) including gonorrhea and chlamydia if: ? You are sexually active and are younger than 61 years of age. ? You are older than 61 years of age and your health care provider tells you that you are at risk for this type of infection. ? Your sexual activity has changed since you were last screened and you are at an increased risk for chlamydia or gonorrhea. Ask your health care provider if you are at risk.  If you do not have HIV, but are at risk, it may be recommended that you take a prescription medicine daily to prevent HIV infection. This is called pre-exposure prophylaxis (PrEP). You are considered at risk if: ? You are sexually active and do not regularly use condoms or know the HIV status of your partner(s). ? You take drugs by injection. ? You are sexually active with a partner who has HIV.  Talk with your health care provider about whether you are at high risk of being infected with HIV. If you choose to begin PrEP, you should first be tested for HIV. You should then be tested every 3 months for as long as you are taking PrEP. Pregnancy  If you are premenopausal and you may become pregnant, ask your health care provider about preconception counseling.  If you may become pregnant, take 400 to 800 micrograms (mcg) of folic acid every day.  If you want to prevent pregnancy, talk to your health care provider about birth control (contraception). Osteoporosis and menopause  Osteoporosis is a disease in which the bones lose minerals and strength with aging. This can result in serious bone fractures. Your risk for osteoporosis can be identified using a bone density scan.  If you are 65 years  of age or older, or if you are at  risk for osteoporosis and fractures, ask your health care provider if you should be screened.  Ask your health care provider whether you should take a calcium or vitamin D supplement to lower your risk for osteoporosis.  Menopause may have certain physical symptoms and risks.  Hormone replacement therapy may reduce some of these symptoms and risks. Talk to your health care provider about whether hormone replacement therapy is right for you. Follow these instructions at home:  Schedule regular health, dental, and eye exams.  Stay current with your immunizations.  Do not use any tobacco products including cigarettes, chewing tobacco, or electronic cigarettes.  If you are pregnant, do not drink alcohol.  If you are breastfeeding, limit how much and how often you drink alcohol.  Limit alcohol intake to no more than 1 drink per day for nonpregnant women. One drink equals 12 ounces of beer, 5 ounces of wine, or 1 ounces of hard liquor.  Do not use street drugs.  Do not share needles.  Ask your health care provider for help if you need support or information about quitting drugs.  Tell your health care provider if you often feel depressed.  Tell your health care provider if you have ever been abused or do not feel safe at home. This information is not intended to replace advice given to you by your health care provider. Make sure you discuss any questions you have with your health care provider. Document Released: 12/17/2010 Document Revised: 11/09/2015 Document Reviewed: 03/07/2015 Elsevier Interactive Patient Education  2018 [place] Eating Plan DASH stands for "Dietary Approaches to Stop Hypertension." The DASH eating plan is a healthy eating plan that has been shown to reduce high blood pressure (hypertension). It may also reduce your risk for type 2 diabetes, heart disease, and stroke. The DASH eating plan may also help with weight loss. What are tips for following  this plan? General guidelines  Avoid eating more than 2,300 mg (milligrams) of salt (sodium) a day. If you have hypertension, you may need to reduce your sodium intake to 1,500 mg a day.  Limit alcohol intake to no more than 1 drink a day for nonpregnant women and 2 drinks a day for men. One drink equals 12 oz of beer, 5 oz of wine, or 1 oz of hard liquor.  Work with your health care provider to maintain a healthy body weight or to lose weight. Ask what an ideal weight is for you.  Get at least 30 minutes of exercise that causes your heart to beat faster (aerobic exercise) most days of the week. Activities may include walking, swimming, or biking.  Work with your health care provider or diet and nutrition specialist (dietitian) to adjust your eating plan to your individual calorie needs. Reading food labels  Check food labels for the amount of sodium per serving. Choose foods with less than 5 percent of the Daily Value of sodium. Generally, foods with less than 300 mg of sodium per serving fit into this eating plan.  To find whole grains, look for the word "whole" as the first word in the ingredient list. Shopping  Buy products labeled as "low-sodium" or "no salt added."  Buy fresh foods. Avoid canned foods and premade or frozen meals. Cooking  Avoid adding salt when cooking. Use salt-free seasonings or herbs instead of table salt or sea salt. Check with your health care provider or pharmacist before using salt  substitutes.  Do not fry foods. Cook foods using healthy methods such as baking, boiling, grilling, and broiling instead.  Cook with heart-healthy oils, such as olive, canola, soybean, or sunflower oil. Meal planning   Eat a balanced diet that includes: ? 5 or more servings of fruits and vegetables each day. At each meal, try to fill half of your plate with fruits and vegetables. ? Up to 6-8 servings of whole grains each day. ? Less than 6 oz of lean meat, poultry, or fish  each day. A 3-oz serving of meat is about the same size as a deck of cards. One egg equals 1 oz. ? 2 servings of low-fat dairy each day. ? A serving of nuts, seeds, or beans 5 times each week. ? Heart-healthy fats. Healthy fats called Omega-3 fatty acids are found in foods such as flaxseeds and coldwater fish, like sardines, salmon, and mackerel.  Limit how much you eat of the following: ? Canned or prepackaged foods. ? Food that is high in trans fat, such as fried foods. ? Food that is high in saturated fat, such as fatty meat. ? Sweets, desserts, sugary drinks, and other foods with added sugar. ? Full-fat dairy products.  Do not salt foods before eating.  Try to eat at least 2 vegetarian meals each week.  Eat more home-cooked food and less restaurant, buffet, and fast food.  When eating at a restaurant, ask that your food be prepared with less salt or no salt, if possible. What foods are recommended? The items listed may not be a complete list. Talk with your dietitian about what dietary choices are best for you. Grains Whole-grain or whole-wheat bread. Whole-grain or whole-wheat pasta. Brown rice. [place]row. Bulgur. Whole-grain and low-sodium cereals. Pita bread. Low-fat, low-sodium crackers. Whole-wheat flour tortillas. Vegetables Fresh or frozen vegetables (raw, steamed, roasted, or grilled). Low-sodium or reduced-sodium tomato and vegetable juice. Low-sodium or reduced-sodium tomato sauce and tomato paste. Low-sodium or reduced-sodium canned vegetables. Fruits All fresh, dried, or frozen fruit. Canned fruit in natural juice (without added sugar). Meat and other protein foods Skinless chicken or [place]. Ground chicken or [place]. Pork with fat trimmed off. Fish and seafood. Egg whites. Dried beans, peas, or lentils. Unsalted nuts, nut butters, and seeds. Unsalted canned beans. Lean cuts of beef with fat trimmed off. Low-sodium, lean deli meat. Dairy Low-fat (1%) or fat-free  (skim) milk. Fat-free, low-fat, or reduced-fat cheeses. Nonfat, low-sodium ricotta or cottage cheese. Low-fat or nonfat yogurt. Low-fat, low-sodium cheese. Fats and oils Soft margarine without trans fats. Vegetable oil. Low-fat, reduced-fat, or light mayonnaise and salad dressings (reduced-sodium). Canola, safflower, olive, soybean, and sunflower oils. Avocado. Seasoning and other foods Herbs. Spices. Seasoning mixes without salt. Unsalted popcorn and pretzels. Fat-free sweets. What foods are not recommended? The items listed may not be a complete list. Talk with your dietitian about what dietary choices are best for you. Grains Baked goods made with fat, such as croissants, muffins, or some breads. Dry pasta or rice meal packs. Vegetables Creamed or fried vegetables. Vegetables in a cheese sauce. Regular canned vegetables (not low-sodium or reduced-sodium). Regular canned tomato sauce and paste (not low-sodium or reduced-sodium). Regular tomato and vegetable juice (not low-sodium or reduced-sodium). Angie Fava. Olives. Fruits Canned fruit in a light or heavy syrup. Fried fruit. Fruit in cream or butter sauce. Meat and other protein foods Fatty cuts of meat. Ribs. Fried meat. Berniece Salines. Sausage. Bologna and other processed lunch meats. Salami. Fatback. Hotdogs. Bratwurst. Salted nuts and seeds.  Canned beans with added salt. Canned or smoked fish. Whole eggs or egg yolks. Chicken or [place] with skin. Dairy Whole or 2% milk, cream, and half-and-half. Whole or full-fat cream cheese. Whole-fat or sweetened yogurt. Full-fat cheese. Nondairy creamers. Whipped toppings. Processed cheese and cheese spreads. Fats and oils Butter. Stick margarine. Lard. Shortening. Ghee. Bacon fat. Tropical oils, such as coconut, palm kernel, or palm oil. Seasoning and other foods Salted popcorn and pretzels. Onion salt, garlic salt, seasoned salt, table salt, and sea salt. Worcestershire sauce. Tartar sauce. Barbecue sauce.  Teriyaki sauce. Soy sauce, including reduced-sodium. Steak sauce. Canned and packaged gravies. Fish sauce. Oyster sauce. Cocktail sauce. Horseradish that you find on the shelf. Ketchup. Mustard. Meat flavorings and tenderizers. Bouillon cubes. Hot sauce and Tabasco sauce. Premade or packaged marinades. Premade or packaged taco seasonings. Relishes. Regular salad dressings. Where to find more information:  National Heart, Lung, and [place]: https://wilson-eaton.com/  American Heart Association: www.heart.org Summary  The DASH eating plan is a healthy eating plan that has been shown to reduce high blood pressure (hypertension). It may also reduce your risk for type 2 diabetes, heart disease, and stroke.  With the DASH eating plan, you should limit salt (sodium) intake to 2,300 mg a day. If you have hypertension, you may need to reduce your sodium intake to 1,500 mg a day.  When on the DASH eating plan, aim to eat more fresh fruits and vegetables, whole grains, lean proteins, low-fat dairy, and heart-healthy fats.  Work with your health care provider or diet and nutrition specialist (dietitian) to adjust your eating plan to your individual calorie needs. This information is not intended to replace advice given to you by your health care provider. Make sure you discuss any questions you have with your health care provider. Document Released: 05/23/2011 Document Revised: 05/27/2016 Document Reviewed: 05/27/2016 Elsevier Interactive Patient Education  2017 [employer].

## 2017-04-15 NOTE — Progress Notes (Signed)
Subjective:   Morgan Collins is a 61 y.o. female who presents for an Initial Medicare Annual Wellness Visit.   Cardiac Risk Factors include: diabetes mellitus;dyslipidemia;hypertension;sedentary lifestyle;obesity (BMI >30kg/m2)     Objective:    Today's Vitals   04/15/17 1110 04/15/17 1111  BP:  (!) 126/92  Pulse:  87  Temp:  97.9 F (36.6 C)  SpO2:  96%  Weight: 252 lb (114.3 kg)   Height: 5\' 1"  (1.549 m)   PainSc: 5  5    Body mass index is 47.61 kg/m.   Current Medications (verified) Outpatient Encounter Prescriptions as of 04/15/2017  Medication Sig  . alendronate (FOSAMAX) 70 MG tablet Take 1 tablet (70 mg total) by mouth every 7 (seven) days. Take with a full glass of water on an empty stomach. (Patient taking differently: Take 70 mg by mouth once a week. Take with a full glass of water on an empty stomach. On Saturday)  . amLODipine (NORVASC) 10 MG tablet Take 1 tablet (10 mg total) by mouth daily.  Marland Kitchen aspirin EC 81 MG tablet Take 1 tablet (81 mg total) by mouth daily.  Marland Kitchen atorvastatin (LIPITOR) 80 MG tablet Take 1 tablet (80 mg total) by mouth daily. (Patient taking differently: Take 80 mg by mouth daily at 6 PM. )  . citalopram (CELEXA) 20 MG tablet Take 20 mg by mouth every evening.  . COD LIVER OIL PO Take 1 capsule by mouth daily.  . Cyanocobalamin (B-12 PO) Take 1 tablet by mouth daily.  Marland Kitchen levothyroxine (SYNTHROID, LEVOTHROID) 100 MCG tablet Take 1 tablet (100 mcg total) by mouth daily.  Marland Kitchen losartan (COZAAR) 100 MG tablet Take 1 tablet (100 mg total) by mouth daily.  . metFORMIN (GLUCOPHAGE) 500 MG tablet TAKE 1 TABLET TWICE DAILY WITH A MEAL (Patient taking differently: Take 500 mg by mouth 2 (two) times daily with a meal. )  . Omega-3 Fatty Acids (OMEGA 3 PO) Take 1 capsule by mouth daily.  Marland Kitchen VITAMIN D, ERGOCALCIFEROL, PO Take 2 tablets by mouth daily.    No facility-administered encounter medications on file as of 04/15/2017.     Allergies  (verified) Patient has no known allergies.   History: Past Medical History:  Diagnosis Date  . Complication of anesthesia    encephalitis in coma 23 days- trach -reversed-no issues now  . Depressed   . High cholesterol   . Hypertension   . Hypothyroidism   . Pre-diabetes   . Sleep apnea    do not use cpap  . Stroke [facility]) 2015   slower ,but not a problem; right thalamic hemorrhage 07/2013  . Thyroid disease    Past Surgical History:  Procedure Laterality Date  . ABDOMINAL HYSTERECTOMY    . APPENDECTOMY    . c sections    . COLONOSCOPY WITH PROPOFOL N/A 12/30/2014   Procedure: COLONOSCOPY WITH PROPOFOL;  Surgeon: Carol Ada, MD;  Location: [facility];  Service: Endoscopy;  Laterality: N/A;  . PARATHYROIDECTOMY Right 02/18/2017   Procedure: RIGHT INFERIOR PARATHYROIDECTOMY;  Surgeon: Armandina Gemma, MD;  Location: [place];  Service: General;  Laterality: Right;  . TRACHEOSTOMY     past history of early 20's- encephalitis coma for 23 days-1982   Family History  Problem Relation Age of Onset  . Congestive Heart Failure Mother   . Hypertension Mother   . Alcohol abuse Father   . Cancer Sister        Breast  . 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 Smoking status: Never Smoker  . Smokeless tobacco: Never Used  . Alcohol use No  . Drug use: No  . Sexual activity: Not Currently    Tobacco Counseling Counseling given: Yes Patient has never smoked and has no plans to start.   Activities of Daily Living In your present state of health, do you have any difficulty performing the following activities: 04/15/2017 02/12/2017  Hearing? N N  Vision? N N  Difficulty concentrating or making decisions? Y N  Walking or climbing stairs? Y Y  Comment - tried  Dressing or bathing? N N  Doing errands, shopping? N N  Preparing Food and eating ? N -  Using the Toilet? N -  In the past six  months, have you accidently leaked urine? Y -  Do you have problems with loss of bowel control? N -  Managing your Medications? Y -  Managing your Finances? Y -  Housekeeping or managing your Housekeeping? Y -  Some recent data might be hidden   Home Safety:  My home has a working smoke alarm:  Yes X 2-3           My home throw rugs have been fastened down to the floor or removed:  Non-slip backs I have a non-slip surface or non-slip mats in the bathtub and shower:  Mat        All my home's stairs have handrails, including any outdoor stairs  First floor apt with one outdside step without handrail        My home's floors, stairs and hallways are free from clutter, wires and cords:  Yes     I have animals in my home  No I wear seatbelts consistently:  Yes   Immunizations and Health Maintenance Immunization History  Administered Date(s) Administered  . Influenza,inj,Quad PF,6+ Mos 03/23/2015, 05/15/2016  . Tdap 12/23/2007   Health Maintenance Due  Topic Date Due  . PNEUMOCOCCAL POLYSACCHARIDE VACCINE (1) 08/27/1957  . OPHTHALMOLOGY EXAM  08/27/1965  . FOOT EXAM  07/25/2016  . MAMMOGRAM  11/15/2016   Declined flu vaccine and pneumovax Had recent eye exam at [facility]. ROI signed to obtain report Patient has pedal edema for which she has appt later today with PCP. Patient preferred to defer foot exam today Order placed for screening mammogram  Patient Care Team: Mayo, Pete Pelt, MD as PCP - General (Family Medicine) Elayne Snare, MD as Consulting Physician (Endocrinology)  Indicate any recent Medical Services you may have received from other than [facility] providers in the past year (date may be approximate).     Assessment:   This is a routine wellness examination for [place].   Hearing/Vision screen  Hearing Screening   Method: Audiometry   125Hz  250Hz  500Hz  1000Hz  2000Hz  3000Hz  4000Hz  6000Hz  8000Hz   Right ear:   Fail 40 40  Fail    Left ear:   40 40 40  40       Dietary issues and exercise activities discussed: Current Exercise Habits: The patient does not participate in regular exercise at present  Goals    . Blood Pressure < 140/90    . Exercise 3x per week (30 min per time) (pt-stated)          [employer] Silver sneakers    . LDL CALC < 100    . Weight (lb) < 234 lb (106.1 kg) (  pt-stated)          7% weight loss      Depression Screen PHQ 2/9 Scores 04/15/2017 03/17/2017 05/15/2016 02/12/2016 09/19/2015 07/26/2015 05/18/2015  PHQ - 2 Score 4 0 0 0 0 0 -  PHQ- 9 Score 11 - - - - - -  Exception Documentation - - - - - - Other- indicate reason in comment box  Not completed - - - - - - pt is concerned about brothers health, pt does not describe as depression    Fall Risk Fall Risk  04/15/2017 03/17/2017 09/19/2015  Falls in the past year? No No No   TUG Test:  Done in 20 seconds. Patient used one hand to push out of chair and to sit back down. Rocked twice to stand. Had slow side-to-side gait. Falls prevention discussed in detail and literature given.  Cognitive Function: Mini-Cog  Passed with score 4/5  Screening Tests Health Maintenance  Topic Date Due  . PNEUMOCOCCAL POLYSACCHARIDE VACCINE (1) 08/27/1957  . OPHTHALMOLOGY EXAM  08/27/1965  . FOOT EXAM  07/25/2016  . MAMMOGRAM  11/15/2016  . INFLUENZA VACCINE  09/14/2017 (Originally 01/15/2017)  . PAP SMEAR  08/15/2025 (Originally 08/27/1976)  . HEMOGLOBIN A1C  08/14/2017  . TETANUS/TDAP  12/22/2017  . COLONOSCOPY  12/29/2024  . Hepatitis C Screening  Completed  . HIV Screening  Completed      Plan:     Declined flu vaccine and pneumovax Had recent eye exam at [facility]. ROI signed to obtain report Patient has pedal edema for which she has appt later today with PCP. Patient preferred to defer foot exam today Order placed for screening mammogram  Encouraged patient to schedule appt with Dr. Iver Nestle for assistance with nutrition  I have personally reviewed and  noted the following in the patient's chart:   . Medical and social history . Use of alcohol, tobacco or illicit drugs  . Current medications and supplements . Functional ability and status . Nutritional status . Physical activity . Advanced directives . List of other physicians . Hospitalizations, surgeries, and ER visits in previous 12 months . Vitals . Screenings to include cognitive, depression, and falls . Referrals and appointments  In addition, I have reviewed and discussed with patient certain preventive protocols, quality metrics, and best practice recommendations. A written personalized care plan for preventive services as well as general preventive health recommendations were provided to patient.     Velora Heckler, RN   04/15/2017

## 2017-04-17 ENCOUNTER — Other Ambulatory Visit: Payer: Self-pay | Admitting: *Deleted

## 2017-04-17 ENCOUNTER — Encounter: Payer: Self-pay | Admitting: *Deleted

## 2017-04-17 NOTE — Progress Notes (Signed)
Subjective:   Morgan Collins is a 61 y.o. female who presents for an Initial Medicare Annual Wellness Visit.   Cardiac Risk Factors include: diabetes mellitus;dyslipidemia;hypertension;sedentary lifestyle;obesity (BMI >30kg/m2)     Objective:    Today's Vitals   04/15/17 1110 04/15/17 1111  BP:  (!) 126/92  Pulse:  87  Temp:  97.9 F (36.6 C)  SpO2:  96%  Weight: 252 lb (114.3 kg)   Height: 5\' 1"  (1.549 m)   PainSc: 5  5    Body mass index is 47.61 kg/m.   Current Medications (verified) Outpatient Encounter Prescriptions as of 04/15/2017  Medication Sig  . alendronate (FOSAMAX) 70 MG tablet Take 1 tablet (70 mg total) by mouth every 7 (seven) days. Take with a full glass of water on an empty stomach. (Patient taking differently: Take 70 mg by mouth once a week. Take with a full glass of water on an empty stomach. On Saturday)  . amLODipine (NORVASC) 10 MG tablet Take 1 tablet (10 mg total) by mouth daily.  Marland Kitchen aspirin EC 81 MG tablet Take 1 tablet (81 mg total) by mouth daily.  Marland Kitchen atorvastatin (LIPITOR) 80 MG tablet Take 1 tablet (80 mg total) by mouth daily. (Patient taking differently: Take 80 mg by mouth daily at 6 PM. )  . COD LIVER OIL PO Take 1 capsule by mouth daily.  . Cyanocobalamin (B-12 PO) Take 1 tablet by mouth daily.  Marland Kitchen levothyroxine (SYNTHROID, LEVOTHROID) 100 MCG tablet Take 1 tablet (100 mcg total) by mouth daily.  Marland Kitchen losartan (COZAAR) 100 MG tablet Take 1 tablet (100 mg total) by mouth daily.  . metFORMIN (GLUCOPHAGE) 500 MG tablet TAKE 1 TABLET TWICE DAILY WITH A MEAL (Patient taking differently: Take 500 mg by mouth 2 (two) times daily with a meal. )  . Omega-3 Fatty Acids (OMEGA 3 PO) Take 1 capsule by mouth daily.  Marland Kitchen VITAMIN D, ERGOCALCIFEROL, PO Take 2 tablets by mouth daily.   . [DISCONTINUED] citalopram (CELEXA) 20 MG tablet Take 20 mg by mouth every evening.   No facility-administered encounter medications on file as of 04/15/2017.      Allergies (verified) Patient has no known allergies.   History: Past Medical History:  Diagnosis Date  . Complication of anesthesia    encephalitis in coma 23 days- trach -reversed-no issues now  . Depressed   . High cholesterol   . Hypertension   . Hypothyroidism   . Pre-diabetes   . Sleep apnea    do not use cpap  . Stroke [facility]) 2015   slower ,but not a problem; right thalamic hemorrhage 07/2013  . Thyroid disease    Past Surgical History:  Procedure Laterality Date  . ABDOMINAL HYSTERECTOMY    . APPENDECTOMY    . c sections    . COLONOSCOPY WITH PROPOFOL N/A 12/30/2014   Procedure: COLONOSCOPY WITH PROPOFOL;  Surgeon: Carol Ada, MD;  Location: [facility];  Service: Endoscopy;  Laterality: N/A;  . PARATHYROIDECTOMY Right 02/18/2017   Procedure: RIGHT INFERIOR PARATHYROIDECTOMY;  Surgeon: Armandina Gemma, MD;  Location: [place];  Service: General;  Laterality: Right;  . TRACHEOSTOMY     past history of early 20's- encephalitis coma for 23 days-1982   Family History  Problem Relation Age of Onset  . Congestive Heart Failure Mother   . Hypertension Mother   . Alcohol abuse Father   . Cancer Sister        Breast  . 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 Smoking status: Never Smoker  . Smokeless tobacco: Never Used  . Alcohol use No  . Drug use: No  . Sexual activity: Not Currently    Tobacco Counseling Counseling given: Yes Patient has never smoked and has no plans to start.   Activities of Daily Living In your present state of health, do you have any difficulty performing the following activities: 04/15/2017 02/12/2017  Hearing? N N  Vision? N N  Difficulty concentrating or making decisions? Y N  Walking or climbing stairs? Y Y  Comment - tried  Dressing or bathing? N N  Doing errands, shopping? N N  Preparing Food and eating ? N -  Using the Toilet? N -  In  the past six months, have you accidently leaked urine? Y -  Do you have problems with loss of bowel control? N -  Managing your Medications? Y -  Managing your Finances? Y -  Housekeeping or managing your Housekeeping? Y -  Some recent data might be hidden   Home Safety:  My home has a working smoke alarm:  Yes X 2-3           My home throw rugs have been fastened down to the floor or removed:  Non-slip backs I have a non-slip surface or non-slip mats in the bathtub and shower:  Mat        All my home's stairs have handrails, including any outdoor stairs  First floor apt with one outdside step without handrail        My home's floors, stairs and hallways are free from clutter, wires and cords:  Yes     I have animals in my home  No I wear seatbelts consistently:  Yes   Immunizations and Health Maintenance Immunization History  Administered Date(s) Administered  . Influenza,inj,Quad PF,6+ Mos 03/23/2015, 05/15/2016  . Tdap 12/23/2007   Health Maintenance Due  Topic Date Due  . PNEUMOCOCCAL POLYSACCHARIDE VACCINE (1) 08/27/1957  . OPHTHALMOLOGY EXAM  08/27/1965  . FOOT EXAM  07/25/2016  . MAMMOGRAM  11/15/2016   Declined flu vaccine and pneumovax Had recent eye exam at [facility]. ROI signed to obtain report Patient has pedal edema for which she has appt later today with PCP. Patient preferred to defer foot exam today Order placed for screening mammogram  Patient Care Team: Morgan Collins, Morgan Pelt, MD as PCP - General (Family Medicine) Morgan Snare, MD as Consulting Physician (Endocrinology)  Indicate any recent Medical Services you may have received from other than [facility] providers in the past year (date may be approximate).     Assessment:   This is a routine wellness examination for Morgan Collins.   Hearing/Vision screen  Hearing Screening   Method: Audiometry   125Hz  250Hz  500Hz  1000Hz  2000Hz  3000Hz  4000Hz  6000Hz  8000Hz   Right ear:   Fail 40 40  Fail    Left ear:   40 40 40   40      Dietary issues and exercise activities discussed: Current Exercise Habits: The patient does not participate in regular exercise at present  Goals    . Blood Pressure < 140/90    . Exercise 3x per week (30 min per time) (pt-stated)          [employer] Silver sneakers    . LDL CALC < 100    . Prioritizing self before taking care of  others (pt-stated)    . Weight (lb) < 234 lb (106.1 kg) (pt-stated)          7% weight loss      Depression Screen PHQ 2/9 Scores 04/15/2017 04/15/2017 03/17/2017 05/15/2016 02/12/2016 09/19/2015 07/26/2015  PHQ - 2 Score 4 4 0 0 0 0 0  PHQ- 9 Score 11 11 - - - - -  Exception Documentation - - - - - - -  Not completed - - - - - - -    Fall Risk Fall Risk  04/15/2017 03/17/2017 09/19/2015  Falls in the past year? No No No   TUG Test:  Done in 20 seconds. Patient used one hand to push out of chair and to sit back down. Rocked twice to stand. Had slow side-to-side gait. Falls prevention discussed in detail and literature given.  Cognitive Function: Mini-Cog  Passed with score 4/5  Screening Tests Health Maintenance  Topic Date Due  . PNEUMOCOCCAL POLYSACCHARIDE VACCINE (1) 08/27/1957  . OPHTHALMOLOGY EXAM  08/27/1965  . FOOT EXAM  07/25/2016  . MAMMOGRAM  11/15/2016  . INFLUENZA VACCINE  09/14/2017 (Originally 01/15/2017)  . PAP SMEAR  08/15/2025 (Originally 08/27/1976)  . HEMOGLOBIN A1C  08/14/2017  . TETANUS/TDAP  12/22/2017  . COLONOSCOPY  12/29/2024  . Hepatitis C Screening  Completed  . HIV Screening  Completed      Plan:     Declined flu vaccine and pneumovax Had recent eye exam at [facility]. ROI signed to obtain report Patient has pedal edema for which she has appt later today with PCP. Patient preferred to defer foot exam today Order placed for screening mammogram  Encouraged patient to schedule appt with Dr. Iver Nestle for assistance with nutrition  I have personally reviewed and noted the following in the patient's  chart:   . Medical and social history . Use of alcohol, tobacco or illicit drugs  . Current medications and supplements . Functional ability and status . Nutritional status . Physical activity . Advanced directives . List of other physicians . Hospitalizations, surgeries, and ER visits in previous 12 months . Vitals . Screenings to include cognitive, depression, and falls . Referrals and appointments  In addition, I have reviewed and discussed with patient certain preventive protocols, quality metrics, and best practice recommendations. A written personalized care plan for preventive services as well as general preventive health recommendations were provided to patient.     Lyman Bishop RN  04/17/2017    I have reviewed this visit and discussed with Howell Rucks, RN, BSN, and agree with her documentation.   Hyman Bible, MD PGY-3

## 2017-04-21 ENCOUNTER — Telehealth: Payer: Self-pay | Admitting: *Deleted

## 2017-04-21 NOTE — Telephone Encounter (Signed)
Patient left message on voicemail stating she is returning MD's call from last week. Nothing in chart

## 2017-04-21 NOTE — Telephone Encounter (Signed)
Will forward to MD to see if she reached out to patient. Mariela Rex,CMA

## 2017-04-23 ENCOUNTER — Ambulatory Visit (INDEPENDENT_AMBULATORY_CARE_PROVIDER_SITE_OTHER): Payer: Medicare PPO | Admitting: Internal Medicine

## 2017-04-23 ENCOUNTER — Ambulatory Visit: Payer: Medicare PPO | Admitting: Internal Medicine

## 2017-04-23 ENCOUNTER — Other Ambulatory Visit: Payer: Self-pay

## 2017-04-23 ENCOUNTER — Encounter: Payer: Self-pay | Admitting: Licensed Clinical Social Worker

## 2017-04-23 ENCOUNTER — Encounter: Payer: Self-pay | Admitting: Internal Medicine

## 2017-04-23 VITALS — BP 120/78 | HR 86 | Temp 98.5°F | Ht 61.0 in | Wt 246.0 lb

## 2017-04-23 DIAGNOSIS — F331 Major depressive disorder, recurrent, moderate: Secondary | ICD-10-CM

## 2017-04-23 DIAGNOSIS — R600 Localized edema: Secondary | ICD-10-CM

## 2017-04-23 NOTE — Progress Notes (Signed)
ESTIMATE TIME:30 minutes Type of Service: Integrated Behavioral Health warm handoff  Interpreter:No.   SUBJECTIVE: Morgan Collins is a 61 y.o. female referred by Dr. Brett Albino for: symptoms of  depression. Patient reports : She is not depressed, she just feels down.  Reports little interest in doing things, sleep difficulties, short tempered and irritable.  Patient admits that she has not taken medication daily as she does not think it is helping her.  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  ISSUES DISCUSSED: Integrated care services, support system, previous and current coping skills, importance of taking medication as prescribed by PCP, benefits of medication and psychotherapy, things patient enjoy or use to enjoy doing, and self-care action plan    ASSESSMENT:Patient currently experiencing symptoms of depression.  Patient may benefit from, and is in agreement to receive further assessment and brief therapeutic interventions to assist with managing her symptoms.   PLAN:   1.Patient will F/U with LCSW in one week  2. Behavioral recommendations: self-care action plan   3Patient agrees to take medication as prescribed by PCP  4. Referral:Integrated Behavioral Health Services (In Clinic),   Warm Hand Off Completed.     Casimer Lanius,  LCSW Licensed Clinical Social Worker [place]   [phone] 4:25 PM

## 2017-04-23 NOTE — Telephone Encounter (Signed)
I did not call patient. Wonder if this was an old message?

## 2017-04-23 NOTE — Assessment & Plan Note (Addendum)
Much improved. Dyspnea on exertion has resolved. Weight down 6 pounds in the last week. - Check BMP today after being on Lasix for the last week - Has echo scheduled for tomorrow - Continue conservative measures with low-salt diet, exercise, and compression stockings as needed - Follow-up if lower extremity edema returns

## 2017-04-23 NOTE — Patient Instructions (Signed)
I am so glad to hear you are feeling better!  Please get your ECHO done tomorrow. I will call you with these results.  Please make sure you keep eating a low salt diet and try to exercise regularly.  I have checked some labs today and I will call you with these results too.  -Dr. Brett Albino

## 2017-04-23 NOTE — Progress Notes (Signed)
   [facility] Phone: [phone]  Subjective:  Morgan Collins is a 61 year old female presenting to clinic for follow-up of bilateral lower extremity edema. She was seen in clinic on 04/15/17 for swelling of her hands and legs over the course of 2 weeks. It is felt that this was likely due to increased salt intake. She also endorsed worsening dyspnea on exertion and had an 8lb weight gain. She had been seen in the ED prior to that clinic visit and was advised to exercise, decrease salt in her diet, and use ted hose. Her edema did not improve with conservative measures and she was prescribed Lasix 20 mg by mouth daily for 7 days. Today, she feels that her swelling is completely better. She is no longer having dyspnea on exertion. She has been using compression stockings, which have been helping. She has also cut back on the amount of salt in her diet. She urinated a lot with the Lasix. Her weight is down 6 pounds today.  She has elected not by her depression. This is been going on for many years. She was taking citalopram previously but had run out of the prescription. She felt like her symptoms were getting worse when she is off the medication. She restarted the medication one week ago, and hasn't noticed a difference yet. She has been feeling down and has also felt like she has little energy. She also has occasional difficulty sleeping. She denies any side effects to the citalopram.  ROS: See HPI for pertinent positives and negatives  Past Medical History- HTN, hx stroke, hypothyroidism, hx hyperparathyroidism s/p parathyroidectomy, osteoporosis, obesity, HLD, depression.  Family history reviewed for today's visit. No changes.  Social history- patient is a never smoker  Objective: BP 120/78   Pulse 86   Temp 98.5 F (36.9 C) (Oral)   Ht 5\' 1"  (1.549 m)   Wt 246 lb (111.6 kg)   SpO2 96%   BMI 46.48 kg/m  Gen: NAD, alert, cooperative with exam HEENT: NCAT, EOMI, MMM Neck:  FROM, supple, no JVD CV: RRR, no murmur Resp: CTABL, no wheezes, normal work of breathing Msk: Mild non-pitting edema in the ankles bilaterally Psych: Appropriate behavior, appropriate affect, normal thought content, normal judgment.   Assessment/Plan: Lower Extremity Edema: Much improved. Dyspnea on exertion has resolved. Weight down 6 pounds in the last week. - Check BMP today after being on Lasix for the last week - Has echo scheduled for tomorrow - Continue conservative measures with low-salt diet, exercise, and compression stockings as needed - Follow-up if lower extremity edema returns  Depression: Uncontrolled after stopping her citalopram. PHQ-9 score is a 16 today. - citalopram restarted 1 week ago - Development worker, international aid brought into appointment today. See separate note. - Follow-up in one month. Will recheck PHQ-9 at that time.   Hyman Bible, MD PGY-3

## 2017-04-23 NOTE — Assessment & Plan Note (Signed)
Uncontrolled after stopping her citalopram. PHQ-9 score is a 16 today. - citalopram restarted 1 week ago - Development worker, international aid brought into appointment today. See separate note. - Follow-up in one month. Will recheck PHQ-9 at that time.

## 2017-04-24 ENCOUNTER — Other Ambulatory Visit: Payer: Self-pay | Admitting: Internal Medicine

## 2017-04-24 ENCOUNTER — Ambulatory Visit (HOSPITAL_COMMUNITY)
Admission: RE | Admit: 2017-04-24 | Discharge: 2017-04-24 | Disposition: A | Discharge status: Home / Self Care | Payer: Medicare PPO | Source: Ambulatory Visit | Attending: Family Medicine | Admitting: Family Medicine

## 2017-04-24 ENCOUNTER — Telehealth: Payer: Self-pay | Admitting: Internal Medicine

## 2017-04-24 ENCOUNTER — Telehealth: Payer: Self-pay | Admitting: *Deleted

## 2017-04-24 DIAGNOSIS — R0600 Dyspnea, unspecified: Secondary | ICD-10-CM

## 2017-04-24 DIAGNOSIS — Z6841 Body Mass Index (BMI) 40.0 and over, adult: Secondary | ICD-10-CM | POA: Insufficient documentation

## 2017-04-24 DIAGNOSIS — E669 Obesity, unspecified: Secondary | ICD-10-CM | POA: Diagnosis not present

## 2017-04-24 DIAGNOSIS — I10 Essential (primary) hypertension: Secondary | ICD-10-CM | POA: Insufficient documentation

## 2017-04-24 DIAGNOSIS — R0609 Other forms of dyspnea: Secondary | ICD-10-CM | POA: Insufficient documentation

## 2017-04-24 DIAGNOSIS — Z8673 Personal history of transient ischemic attack (TIA), and cerebral infarction without residual deficits: Secondary | ICD-10-CM | POA: Diagnosis not present

## 2017-04-24 DIAGNOSIS — E785 Hyperlipidemia, unspecified: Secondary | ICD-10-CM | POA: Insufficient documentation

## 2017-04-24 DIAGNOSIS — E119 Type 2 diabetes mellitus without complications: Secondary | ICD-10-CM | POA: Insufficient documentation

## 2017-04-24 LAB — BASIC METABOLIC PANEL
BUN/Creatinine Ratio: 18 (ref 12–28)
BUN: 18 mg/dL (ref 8–27)
CO2: 27 mmol/L (ref 20–29)
Calcium: 9.7 mg/dL (ref 8.7–10.3)
Chloride: 104 mmol/L (ref 96–106)
Creatinine, Ser: 0.99 mg/dL (ref 0.57–1.00)
GFR calc Af Amer: 71 mL/min/{1.73_m2} (ref >59)
GFR calc non Af Amer: 62 mL/min/{1.73_m2} (ref >59)
Glucose: 133 mg/dL — ABNORMAL HIGH (ref 65–99)
Potassium: 4.4 mmol/L (ref 3.5–5.2)
Sodium: 141 mmol/L (ref 134–144)

## 2017-04-24 NOTE — Telephone Encounter (Signed)
Terry with [facility] states that patient needs [facility] An authorization for sleep study. Please let her know once this has been approved

## 2017-04-24 NOTE — Telephone Encounter (Signed)
Spoke with patient over the phone to let her know that her labs were normal and her ECHO showed normal EF with grade 1 diastolic dysfunction. All questions answered. Patient voiced understanding.  Hyman Bible, MD PGY-3

## 2017-04-24 NOTE — Progress Notes (Signed)
  Echocardiogram 2D Echocardiogram has been performed.  Morgan Collins 04/24/2017, 9:00 AM

## 2017-04-29 ENCOUNTER — Other Ambulatory Visit: Payer: Self-pay | Admitting: Internal Medicine

## 2017-04-29 DIAGNOSIS — Z1231 Encounter for screening mammogram for malignant neoplasm of breast: Secondary | ICD-10-CM

## 2017-04-30 ENCOUNTER — Ambulatory Visit: Payer: Medicare PPO

## 2017-05-07 ENCOUNTER — Ambulatory Visit (INDEPENDENT_AMBULATORY_CARE_PROVIDER_SITE_OTHER): Payer: Medicare PPO | Admitting: Licensed Clinical Social Worker

## 2017-05-07 DIAGNOSIS — F331 Major depressive disorder, recurrent, moderate: Secondary | ICD-10-CM

## 2017-05-07 NOTE — Progress Notes (Signed)
Type of Service: Clarita F/U Total time:35 minutes :  Interpretor:No.    SUBJECTIVE: Morgan Collins is a 61 y.o. female  referred by Dr. Brett Albino.      Reason for follow-up: Continue brief intervention to assist patient with managing symptoms of depression, as well as life stressors. Reports  minimal impairment in daily functions. Patient presented pleasant and engaged in conversation.   Appearance:Neat ; Thought process: Coherent; Affect: Appropriate Risk of harm to self or others: No plan to harm self or others. PHQ 9=11, indication of: moderate depression. GAD-7=8, indication of: moderate anxiety.  GOALS: Patient will reduce symptoms of: anxiety, depression and stress , and increase  ability UJ:WJXBJY skills, self-management skills and stress reduction, Increase motivation to adhere to plan of care. Intervention: Motivational Interviewing, Solution-Focused Strategies, Behavioral Activation and Supportive Counseling, Psychoeducation   Issues discussed: review of self-care action plan  ; things patient enjoy doing ;  Managing stressors with family and friends, and setting a daily routine using a calendar,  ASSESSMENT:Patient continues to experience symptoms of depression with some anxiety.  Patient is making progress towards goal.  States over all she is feeling better. This is also evident by PHQ-9/GAD score above. Patient states she is taking her celexa daily as prescribed by PCP and noticed a difference in the way she feels.  States she is sleeping better, less irritable and no longer feels tearful.  Patient may benefit from, and is in agreement to continue further assessment and brief therapeutic interventions to assist with managing her symptoms.  PLAN: 1. Patient will F/U with LCSW in two weeks 2. Behavioral recommendations: schedule activities using weekly calendar, and continue behavioral activation 3. Referral: none at this time  Casimer Lanius, LCSW Licensed Clinical  Social Worker [place]   [phone] 3:11 PM

## 2017-05-21 ENCOUNTER — Ambulatory Visit: Payer: Medicare PPO

## 2017-05-28 ENCOUNTER — Ambulatory Visit: Payer: Medicare PPO

## 2017-06-26 ENCOUNTER — Ambulatory Visit: Payer: Medicare PPO

## 2017-07-07 ENCOUNTER — Encounter: Payer: Self-pay | Admitting: Internal Medicine

## 2017-07-12 ENCOUNTER — Other Ambulatory Visit: Payer: Self-pay | Admitting: Internal Medicine

## 2017-07-18 ENCOUNTER — Encounter: Payer: Self-pay | Admitting: Internal Medicine

## 2017-08-20 ENCOUNTER — Emergency Department (HOSPITAL_COMMUNITY)
Admission: EM | Admit: 2017-08-20 | Discharge: 2017-08-21 | Disposition: A | Discharge status: Home / Self Care | Payer: Medicare PPO | Attending: Physician Assistant | Admitting: Physician Assistant

## 2017-08-20 ENCOUNTER — Emergency Department (HOSPITAL_COMMUNITY): Payer: Medicare PPO

## 2017-08-20 ENCOUNTER — Emergency Department (HOSPITAL_BASED_OUTPATIENT_CLINIC_OR_DEPARTMENT_OTHER)
Admit: 2017-08-20 | Discharge: 2017-08-20 | Disposition: A | Discharge status: Home / Self Care | Payer: Medicare PPO | Attending: Emergency Medicine | Admitting: Emergency Medicine

## 2017-08-20 ENCOUNTER — Ambulatory Visit: Payer: Medicare PPO | Admitting: Internal Medicine

## 2017-08-20 ENCOUNTER — Other Ambulatory Visit: Payer: Self-pay

## 2017-08-20 ENCOUNTER — Encounter: Payer: Self-pay | Admitting: Internal Medicine

## 2017-08-20 ENCOUNTER — Encounter (HOSPITAL_COMMUNITY): Payer: Self-pay

## 2017-08-20 VITALS — BP 122/86 | HR 100 | Temp 97.9°F | Wt 245.0 lb

## 2017-08-20 DIAGNOSIS — M7989 Other specified soft tissue disorders: Secondary | ICD-10-CM | POA: Insufficient documentation

## 2017-08-20 DIAGNOSIS — Z7982 Long term (current) use of aspirin: Secondary | ICD-10-CM | POA: Insufficient documentation

## 2017-08-20 DIAGNOSIS — R609 Edema, unspecified: Secondary | ICD-10-CM

## 2017-08-20 DIAGNOSIS — E119 Type 2 diabetes mellitus without complications: Secondary | ICD-10-CM | POA: Diagnosis not present

## 2017-08-20 DIAGNOSIS — E039 Hypothyroidism, unspecified: Secondary | ICD-10-CM | POA: Diagnosis not present

## 2017-08-20 DIAGNOSIS — R0602 Shortness of breath: Secondary | ICD-10-CM | POA: Insufficient documentation

## 2017-08-20 DIAGNOSIS — Z7984 Long term (current) use of oral hypoglycemic drugs: Secondary | ICD-10-CM | POA: Insufficient documentation

## 2017-08-20 DIAGNOSIS — Z79899 Other long term (current) drug therapy: Secondary | ICD-10-CM | POA: Diagnosis not present

## 2017-08-20 DIAGNOSIS — Z8673 Personal history of transient ischemic attack (TIA), and cerebral infarction without residual deficits: Secondary | ICD-10-CM | POA: Diagnosis not present

## 2017-08-20 DIAGNOSIS — I10 Essential (primary) hypertension: Secondary | ICD-10-CM | POA: Diagnosis not present

## 2017-08-20 DIAGNOSIS — R079 Chest pain, unspecified: Secondary | ICD-10-CM | POA: Diagnosis not present

## 2017-08-20 HISTORY — DX: Type 2 diabetes mellitus without complications: E11.9

## 2017-08-20 LAB — CBC WITH DIFFERENTIAL/PLATELET
Basophils Absolute: 0 10*3/uL (ref 0.0–0.1)
Basophils Relative: 0 %
Eosinophils Absolute: 0.1 10*3/uL (ref 0.0–0.7)
Eosinophils Relative: 1 %
HCT: 37.5 % (ref 36.0–46.0)
Hemoglobin: 11.4 g/dL — ABNORMAL LOW (ref 12.0–15.0)
Lymphocytes Relative: 36 %
Lymphs Abs: 3.2 10*3/uL (ref 0.7–4.0)
MCH: 24.8 pg — ABNORMAL LOW (ref 26.0–34.0)
MCHC: 30.4 g/dL (ref 30.0–36.0)
MCV: 81.5 fL (ref 78.0–100.0)
Monocytes Absolute: 0.6 10*3/uL (ref 0.1–1.0)
Monocytes Relative: 6 %
Neutro Abs: 5.1 10*3/uL (ref 1.7–7.7)
Neutrophils Relative %: 57 %
Platelets: 313 10*3/uL (ref 150–400)
RBC: 4.6 MIL/uL (ref 3.87–5.11)
RDW: 16.4 % — ABNORMAL HIGH (ref 11.5–15.5)
WBC: 9 10*3/uL (ref 4.0–10.5)

## 2017-08-20 LAB — BASIC METABOLIC PANEL
Anion gap: 10 (ref 5–15)
BUN: 16 mg/dL (ref 6–20)
CO2: 26 mmol/L (ref 22–32)
Calcium: 9.6 mg/dL (ref 8.9–10.3)
Chloride: 103 mmol/L (ref 101–111)
Creatinine, Ser: 0.78 mg/dL (ref 0.44–1.00)
GFR calc Af Amer: >60 mL/min (ref >60)
GFR calc non Af Amer: >60 mL/min (ref >60)
Glucose, Bld: 111 mg/dL — ABNORMAL HIGH (ref 65–99)
Potassium: 4.2 mmol/L (ref 3.5–5.1)
Sodium: 139 mmol/L (ref 135–145)

## 2017-08-20 LAB — I-STAT TROPONIN, ED: Troponin i, poc: 0 ng/mL (ref 0.00–0.08)

## 2017-08-20 LAB — POCT GLYCOSYLATED HEMOGLOBIN (HGB A1C): Hemoglobin A1C: 7.5

## 2017-08-20 LAB — D-DIMER, QUANTITATIVE: D-Dimer, Quant: 0.4 ug/mL-FEU (ref 0.00–0.50)

## 2017-08-20 MED ORDER — ACETAMINOPHEN 325 MG PO TABS
650.0000 mg | ORAL_TABLET | Freq: Once | ORAL | Status: AC
Start: 2017-08-20 — End: 2017-08-20
  Administered 2017-08-20: 650 mg via ORAL
  Filled 2017-08-20: qty 2

## 2017-08-20 MED ORDER — IOPAMIDOL (ISOVUE-370) INJECTION 76%
INTRAVENOUS | Status: AC
  Administered 2017-08-20: 100 mL
  Filled 2017-08-20: qty 100

## 2017-08-20 NOTE — ED Notes (Signed)
Pt refused to remove pants, RN Notified.

## 2017-08-20 NOTE — Discharge Instructions (Signed)
We happy to report that your DVT study was negative.  And your pulmonary embolism study was negative.  Please follow-up with your primary care physician for further recommendations.

## 2017-08-20 NOTE — ED Provider Notes (Signed)
Patient placed in Quick Look pathway, seen and evaluated   Chief Complaint: shortness of breath, left leg pain  HPI:   62 y.o. female reports she has had cold symptoms and shortness of breath x 3 weeks. She completed a course of steroids. She saw her doctor today in the [facility] and was told to come to the ED for evaluation for possible DVT of left leg and PE.   ROS: Resp: shortness of breath  M/S: left leg pain  Physical Exam:  BP 127/86 (BP Location: Right Arm)   Pulse 88   Temp 98.1 F (36.7 C) (Oral)   Resp 16   Ht 5\' 5"  (1.651 m)   Wt 111.1 kg (245 lb)   SpO2 98%   BMI 40.77 kg/m    Gen: No distress  Neuro: Awake and Alert  Skin: Warm and dry  Heart: regular rate and rhythm  Lungs: no rales, no wheezing  M/S: tenderness left foot/ankle, no calf pain  on exam.     Initiation of care has begun. The patient has been counseled on the process, plan, and necessity for staying for the completion/evaluation, and the remainder of the medical screening examination    Ashley Murrain, NP 08/20/17 1521

## 2017-08-20 NOTE — Progress Notes (Signed)
Left lower extremity venous duplex has been completed. Negative for DVT. Results were given to [facility] NP.  08/20/17 4:04 PM Morgan Collins RVT

## 2017-08-20 NOTE — ED Triage Notes (Signed)
Pt endorses shob, left leg swelling, cold sx x 3 weeks with fatigue. Sent here for DVT/PE rule out. Speaking in complete sentences.

## 2017-08-20 NOTE — ED Provider Notes (Signed)
[facility] EMERGENCY DEPARTMENT Provider Note   CSN: [id number] Arrival date & time: 08/20/17  1456     History   Chief Complaint Chief Complaint  Patient presents with  . Leg Swelling  . Shortness of Breath    HPI Morgan Collins is a 62 y.o. female.  HPI   62 year old female was sent here by her primary care physician.  Primary care was concerned this patient had a shortness of breath and leg swelling.  Leg swelling is been noted on several prior visits.  In addition patient had several workups for dyspnea including her most recent echo in November of this year that was normal.  Patient reports having a cold and therefore lying around much more than usual.  Otherwise no recent travel.  When asked in the emergency department, patient reports that her dyspnea is not any different than usual.  She reports when she goes long distances because she becomes dyspneic which is why she went to go get a echo done this last year.  Past Medical History:  Diagnosis Date  . Complication of anesthesia    encephalitis in coma 23 days- trach -reversed-no issues now  . Depressed   . High cholesterol   . Hypertension   . Hypothyroidism   . Pre-diabetes   . Sleep apnea    do not use cpap  . Stroke [facility]) 2015   slower ,but not a problem; right thalamic hemorrhage 07/2013  . Thyroid disease     Patient Active Problem List   Diagnosis Date Noted  . Left leg swelling 08/20/2017  . Lower extremity edema 04/15/2017  . Health care maintenance 02/12/2016  . OSA (obstructive sleep apnea) 09/19/2015  . Type 2 diabetes mellitus, controlled ([place]) 03/23/2015  . Hyperparathyroidism, primary ([place]) 02/09/2015  . Vitamin D deficiency 02/09/2015  . Postsurgical menopause 02/09/2015  . Osteoporosis 12/28/2014  . Depression 10/25/2014  . Nontraumatic subcortical hemorrhage of cerebral hemisphere ([place]) 03/08/2014  . HLD (hyperlipidemia) 03/08/2014  . Essential hypertension  03/08/2014  . Obesities, morbid ([place]) 11/22/2013  . Hypothyroidism 08/18/2013  . Thalamic hemorrhage ([place]) 08/04/2013  . Stroke ([place]) 07/27/2013  . ICH (intracerebral hemorrhage) ([place]) 07/27/2013  . Obstructive hydrocephalus 07/27/2013    Past Surgical History:  Procedure Laterality Date  . ABDOMINAL HYSTERECTOMY    . APPENDECTOMY    . c sections    . COLONOSCOPY WITH PROPOFOL N/A 12/30/2014   Procedure: COLONOSCOPY WITH PROPOFOL;  Surgeon: Carol Ada, MD;  Location: [facility];  Service: Endoscopy;  Laterality: N/A;  . PARATHYROIDECTOMY Right 02/18/2017   Procedure: RIGHT INFERIOR PARATHYROIDECTOMY;  Surgeon: Armandina Gemma, MD;  Location: [facility];  Service: General;  Laterality: Right;  . TRACHEOSTOMY     past history of early 20's- encephalitis coma for 23 days-1982    OB History    No data available       Home Medications    Prior to Admission medications   Medication Sig Start Date End Date Taking? Authorizing Provider  alendronate (FOSAMAX) 70 MG tablet Take 1 tablet (70 mg total) by mouth every 7 (seven) days. Take with a full glass of water on an empty stomach. Patient taking differently: Take 70 mg by mouth once a week. Take with a full glass of water on an empty stomach. On Saturday 01/30/17   MayoPete Pelt, MD  amLODipine (NORVASC) 10 MG tablet Take 1 tablet (10 mg total) by mouth daily. 01/30/17   Mayo, Pete Pelt, MD  aspirin  EC 81 MG tablet Take 1 tablet (81 mg total) by mouth daily. 01/30/17   Mayo, Pete Pelt, MD  atorvastatin (LIPITOR) 80 MG tablet Take 1 tablet (80 mg total) by mouth daily. Patient taking differently: Take 80 mg by mouth daily at 6 PM.  01/30/17   Mayo, Pete Pelt, MD  citalopram (CELEXA) 20 MG tablet TAKE 1 TABLET BY MOUTH EVERY DAY IN THE EVENING 07/14/17   Mayo, Pete Pelt, MD  COD LIVER OIL PO Take 1 capsule by mouth daily.    [provider]  Cyanocobalamin (B-12 PO) Take 1 tablet by mouth daily.    [provider]  furosemide  (LASIX) 20 MG tablet Take 1 tablet (20 mg total) by mouth daily. 04/15/17   Mayo, Pete Pelt, MD  levothyroxine (SYNTHROID, LEVOTHROID) 100 MCG tablet Take 1 tablet (100 mcg total) by mouth daily. 01/30/17   Mayo, Pete Pelt, MD  losartan (COZAAR) 100 MG tablet Take 1 tablet (100 mg total) by mouth daily. 01/30/17   Mayo, Pete Pelt, MD  metFORMIN (GLUCOPHAGE) 500 MG tablet TAKE 1 TABLET TWICE DAILY WITH A MEAL Patient taking differently: Take 500 mg by mouth 2 (two) times daily with a meal.  01/30/17   Mayo, Pete Pelt, MD  Omega-3 Fatty Acids (OMEGA 3 PO) Take 1 capsule by mouth daily.    [provider]  VITAMIN D, ERGOCALCIFEROL, PO Take 2 tablets by mouth daily.     [provider]    Family History Family History  Problem Relation Age of Onset  . Congestive Heart Failure Mother   . Hypertension Mother   . Alcohol abuse Father   . Cancer Sister        Breast  . Dementia Brother     Social History Social History   Tobacco Use  . Smoking status: Never Smoker  . Smokeless tobacco: Never Used  Substance Use Topics  . Alcohol use: No  . Drug use: No     Allergies   Patient has no known allergies.   Review of Systems Review of Systems  Constitutional: Negative for activity change.  Respiratory: Positive for shortness of breath.   Cardiovascular: Positive for leg swelling. Negative for chest pain.  Gastrointestinal: Negative for abdominal pain.     Physical Exam Updated Vital Signs BP (!) 131/94   Pulse 78   Temp 98.1 F (36.7 C) (Oral)   Resp 18   Ht 5\' 5"  (1.651 m)   Wt 111.1 kg (245 lb)   SpO2 97%   BMI 40.77 kg/m   Physical Exam  Constitutional: She is oriented to person, place, and time. She appears well-developed and well-nourished.  HENT:  Head: Normocephalic and atraumatic.  Eyes: Right eye exhibits no discharge.  Cardiovascular: Normal rate and regular rhythm.  Pulmonary/Chest: Effort normal and breath sounds normal. No accessory  muscle usage. No tachypnea. No respiratory distress. She has no decreased breath sounds.  Neurological: She is oriented to person, place, and time.  Skin: Skin is warm and dry. She is not diaphoretic.  Psychiatric: She has a normal mood and affect.  Nursing note and vitals reviewed.    ED Treatments / Results  Labs (all labs ordered are listed, but only abnormal results are displayed) Labs Reviewed  BASIC METABOLIC PANEL  CBC  CBC WITH DIFFERENTIAL/PLATELET  D-DIMER, QUANTITATIVE (NOT AT [facility])  I-STAT TROPONIN, ED    EKG  EKG Interpretation None       Radiology Dg Chest 2 View  Result Date: 08/20/2017 CLINICAL DATA:  62 y/o F; shortness of breath and chest pain under the breasts. EXAM: CHEST - 2 VIEW COMPARISON:  06/23/2015 chest radiograph FINDINGS: Stable normal cardiac silhouette. Aortic atherosclerosis with calcification. Clear lungs. No pleural effusion or pneumothorax. No acute osseous abnormality. Flowing anterior ossification of thoracic spine compatible with DISH. IMPRESSION: No acute pulmonary process identified. Electronically Signed   By: Kristine Garbe M.D.   On: 08/20/2017 17:23    Procedures Procedures (including critical care time)  Medications Ordered in ED Medications  acetaminophen (TYLENOL) tablet 650 mg (not administered)     Initial Impression / Assessment and Plan / ED Course  I have reviewed the triage vital signs and the nursing notes.  Pertinent labs & imaging results that were available during my care of the patient were reviewed by me and considered in my medical decision making (see chart for details).    62 year old female was sent here by her primary care physician.  Primary care was concerned this patient had a shortness of breath and leg swelling.  Leg swelling is been noted on several prior visits.  In addition patient had several workups for dyspnea including her most recent echo in November of this year that was  normal.  Patient reports having a cold and therefore lying around much more than usual.  Otherwise no recent travel.  When asked in the emergency department, patient reports that her dyspnea is not any different than usual.  She reports when she goes long distances because she becomes dyspneic which is why she went to go get a echo done this last year.  7:55 PM DVT study is negative today.  We will send d-dimer.  The patient is very low risk for pulmonary embolism given that her vital signs are completely normal.  Not tachypnea, lung sounds normal.  She reports that subjective shortness of breath is very similar to it was several months ago when she had the echo.  We will send d-dimer.  CT angio completed and negative.   Will dc with follow up with PCP.   Final Clinical Impressions(s) / ED Diagnoses   Final diagnoses:  None    ED Discharge Orders    None       Macarthur Critchley, MD 08/21/17 2338

## 2017-08-20 NOTE — ED Notes (Signed)
This RN and 1 other RN attempted IV access on pt and unsuccessful. Pt sent to Vascular US.

## 2017-08-20 NOTE — ED Notes (Signed)
Pt threw  Her papers in trash on the way out then went back and go them  Very unhappy

## 2017-08-20 NOTE — Patient Instructions (Signed)
It was so nice to see you!  I am concerned that you have a blood clot in your leg and possibly your lungs. I think you need to go to the emergency department for further evaluation.  -Dr. Brett Albino

## 2017-08-20 NOTE — Assessment & Plan Note (Signed)
Concern for left sided DVT and PE. Patient has spent the last 3 weeks in bed because she was sick. She then developed left leg swelling and pain. After that, she began to have dyspnea on exertion and chest pain located underneath her breasts. No history of blood clots. Wells score is a 6 (unilateral leg swelling, immobilization for > 3 days, HR 100). O2 saturations 95% at rest without hx of lung disease. - Will send to the ED for further work-up - Think she would benefit from a LE doppler and CTA chest. - Patient in agreement with the plan

## 2017-08-20 NOTE — ED Notes (Signed)
IV team at bedside at this time. 

## 2017-08-20 NOTE — Progress Notes (Signed)
   [facility] Phone: [phone]  Subjective:  Morgan Collins is a 62 year old female presenting to clinic with unilateral leg swelling and pain for the last 1 week. She has spent the last three weeks in bed because she was sick with a cold. One week ago, her left leg started swelling and then became painful. She also notes dyspnea on exertion and pain underneath both breasts for the last week. She has also had a dry cough. No fevers.  ROS: See HPI for pertinent positives and negatives  Past Medical History- HTN, hx stroke, OSA, primary hyperparathryoidism, hypothyroidism, T2DM, hx intracerebral hemorrhage, osteoporosis, HLD  Family history reviewed for today's visit. No changes.  Social history- patient is a never smoker  Objective: BP 122/86   Pulse 89   Temp 97.9 F (36.6 C) (Oral)   Wt 245 lb (111.1 kg)   SpO2 95%   BMI 46.29 kg/m  Gen: NAD, alert, cooperative with exam CV: Tachycardic, regular rhythm, no murmur Resp: CTABL, no wheezes, normal work of breathing Msk: Bilateral lower extremity edema L > R, +tenderness to palpation of the left calf.  Assessment/Plan: Unilateral Leg Swelling: Concern for left sided DVT and PE. Patient has spent the last 3 weeks in bed because she was sick. She then developed left leg swelling and pain. After that, she began to have dyspnea on exertion and chest pain located underneath her breasts. No history of blood clots. Wells score is a 6 (unilateral leg swelling, immobilization for > 3 days, HR 100). O2 saturations 95% at rest without hx of lung disease. - Will send to the ED for further work-up - Think she would benefit from a LE doppler and CTA chest. - Patient in agreement with the plan   Hyman Bible, MD PGY-3

## 2017-08-22 ENCOUNTER — Other Ambulatory Visit: Payer: Self-pay | Admitting: Internal Medicine

## 2017-08-22 DIAGNOSIS — R0683 Snoring: Secondary | ICD-10-CM

## 2017-09-05 DIAGNOSIS — E039 Hypothyroidism, unspecified: Secondary | ICD-10-CM | POA: Diagnosis not present

## 2017-09-05 DIAGNOSIS — E785 Hyperlipidemia, unspecified: Secondary | ICD-10-CM | POA: Diagnosis not present

## 2017-09-05 DIAGNOSIS — F329 Major depressive disorder, single episode, unspecified: Secondary | ICD-10-CM | POA: Diagnosis not present

## 2017-09-05 DIAGNOSIS — Z7984 Long term (current) use of oral hypoglycemic drugs: Secondary | ICD-10-CM | POA: Diagnosis not present

## 2017-09-05 DIAGNOSIS — E119 Type 2 diabetes mellitus without complications: Secondary | ICD-10-CM | POA: Diagnosis not present

## 2017-09-05 DIAGNOSIS — Z6841 Body Mass Index (BMI) 40.0 and over, adult: Secondary | ICD-10-CM | POA: Diagnosis not present

## 2017-09-05 DIAGNOSIS — M810 Age-related osteoporosis without current pathological fracture: Secondary | ICD-10-CM | POA: Diagnosis not present

## 2017-09-05 DIAGNOSIS — I10 Essential (primary) hypertension: Secondary | ICD-10-CM | POA: Diagnosis not present

## 2017-09-08 ENCOUNTER — Other Ambulatory Visit: Payer: Self-pay | Admitting: Internal Medicine

## 2017-09-08 DIAGNOSIS — R7303 Prediabetes: Secondary | ICD-10-CM

## 2017-09-08 DIAGNOSIS — I10 Essential (primary) hypertension: Secondary | ICD-10-CM

## 2017-09-17 ENCOUNTER — Ambulatory Visit
Admission: RE | Admit: 2017-09-17 | Discharge: 2017-09-17 | Disposition: A | Discharge status: Home / Self Care | Payer: Medicare PPO | Source: Ambulatory Visit | Attending: Family Medicine | Admitting: Family Medicine

## 2017-09-17 DIAGNOSIS — Z1231 Encounter for screening mammogram for malignant neoplasm of breast: Secondary | ICD-10-CM

## 2017-09-19 ENCOUNTER — Ambulatory Visit (HOSPITAL_BASED_OUTPATIENT_CLINIC_OR_DEPARTMENT_OTHER): Payer: Medicare PPO | Attending: Family Medicine | Admitting: Internal Medicine

## 2017-09-19 VITALS — Ht 61.0 in | Wt 240.0 lb

## 2017-09-19 DIAGNOSIS — R5383 Other fatigue: Secondary | ICD-10-CM | POA: Diagnosis not present

## 2017-09-19 DIAGNOSIS — I493 Ventricular premature depolarization: Secondary | ICD-10-CM | POA: Diagnosis not present

## 2017-09-19 DIAGNOSIS — I10 Essential (primary) hypertension: Secondary | ICD-10-CM | POA: Diagnosis not present

## 2017-09-19 DIAGNOSIS — E119 Type 2 diabetes mellitus without complications: Secondary | ICD-10-CM | POA: Diagnosis not present

## 2017-09-19 DIAGNOSIS — G4733 Obstructive sleep apnea (adult) (pediatric): Secondary | ICD-10-CM | POA: Diagnosis not present

## 2017-09-19 DIAGNOSIS — G4736 Sleep related hypoventilation in conditions classified elsewhere: Secondary | ICD-10-CM | POA: Insufficient documentation

## 2017-09-19 DIAGNOSIS — E669 Obesity, unspecified: Secondary | ICD-10-CM | POA: Diagnosis not present

## 2017-09-19 DIAGNOSIS — Z6841 Body Mass Index (BMI) 40.0 and over, adult: Secondary | ICD-10-CM | POA: Diagnosis not present

## 2017-09-19 DIAGNOSIS — R0683 Snoring: Secondary | ICD-10-CM | POA: Diagnosis not present

## 2017-09-19 DIAGNOSIS — Z7982 Long term (current) use of aspirin: Secondary | ICD-10-CM | POA: Diagnosis not present

## 2017-09-19 DIAGNOSIS — Z79899 Other long term (current) drug therapy: Secondary | ICD-10-CM | POA: Insufficient documentation

## 2017-09-27 DIAGNOSIS — G4733 Obstructive sleep apnea (adult) (pediatric): Secondary | ICD-10-CM

## 2017-09-27 NOTE — Procedures (Signed)
   Patient Name: Morgan Collins, Morgan Collins Date: 09/19/2017 Gender: Female D.O.B: August 09, 1955 Age (years): 62 Referring Provider: Madison Hickman Height (inches): 28 Interpreting Physician: Baird Lyons MD, ABSM Weight (lbs): 240 RPSGT: Baxter Flattery BMI: 11 MRN: [id number] Neck Size: 16.50  CLINICAL INFORMATION Sleep Study Type: NPSG Indication for sleep study: Diabetes, Fatigue, Hypertension, Obesity, OSA  Epworth Sleepiness Score:  12  Most recent polysomnogram dated 12/07/2015 revealed an AHI of 28.5/h and RDI of 38.3/h.  SLEEP STUDY TECHNIQUE As per the AASM Manual for the Scoring of Sleep and Associated Events v2.3 (April 2016) with a hypopnea requiring 4% desaturations. The channels recorded and monitored were frontal, central and occipital EEG, electrooculogram (EOG), submentalis EMG (chin), nasal and oral airflow, thoracic and abdominal wall motion, anterior tibialis EMG, snore microphone, electrocardiogram, and pulse oximetry.  MEDICATIONS Medications self-administered by patient taken the night of the study : TYLENOL, AMLODIPINE, ASPIRIN, LIPITOR, LOSARTAN  SLEEP ARCHITECTURE The study was initiated at 10:17:42 PM and ended at 4:57:33 AM.  Sleep onset time was 3.5 minutes and the sleep efficiency was 93.2%%. The total sleep time was 372.8 minutes.  Stage REM latency was 87.5 minutes.  The patient spent 1.9%% of the night in stage N1 sleep, 76.3%% in stage N2 sleep, 0.0%% in stage N3 and 21.86% in REM.  Alpha intrusion was absent.  Supine sleep was 0.00%.  RESPIRATORY PARAMETERS The overall apnea/hypopnea index (AHI) was 30.3 per hour. There were 25 total apneas, including 25 obstructive, 0 central and 0 mixed apneas. There were 163 hypopneas and 5 RERAs.  The AHI during Stage REM sleep was 70.7 per hour.  AHI while supine was N/A per hour.  The mean oxygen saturation was 89.0%. The minimum SpO2 during sleep was 67.0%.  moderate snoring was noted during this  study.  CARDIAC DATA The 2 lead EKG demonstrated sinus rhythm. The mean heart rate was 76.6 beats per minute. Other EKG findings include: PVCs.  LEG MOVEMENT DATA The total PLMS were 0 with a resulting PLMS index of 0.0. Associated arousal with leg movement index was 0.0 .  IMPRESSIONS - Moderate to severe obstructive sleep apnea occurred during this study (AHI = 30.3/h). -  Insufficient early events to meet protoccol requirements for split CPAP titration.  - No significant central sleep apnea occurred during this study (CAI = 0.0/h). - Severe oxygen desaturation was noted during this study (Min O2 = 67.0%). Mean 89% - The patient snored with moderate snoring volume. - EKG findings include PVCs. - Clinically significant periodic limb movements did not occur during sleep. No significant associated arousals.  DIAGNOSIS - Obstructive Sleep Apnea (327.23 [G47.33 ICD-10]) - Nocturnal Hypoxemia (327.26 [G47.36 ICD-10])  RECOMMENDATIONS - Recommend CPAP titration study or DME/ Autopap. Consultation for management is available. Other options would be based on clinical judgment. - Be careful with sedatives and other CNS depressants that may worsen sleep apnea and disrupt normal sleep architecture. - Sleep hygiene should be reviewed to assess factors that may improve sleep quality. - Weight management and regular exercise should be initiated or continued if appropriate.  [Electronically signed] 09/27/2017 11:49 AM  Baird Lyons MD, ABSM Diplomate, American Board of Sleep Medicine   NPI: [id number]  [place] [place] [facility]  ELECTRONICALLY SIGNED ON:  09/27/2017, 11:44 AM [place] PH: [phone]   FX: [phone] Agency

## 2017-10-10 ENCOUNTER — Telehealth: Payer: Self-pay

## 2017-10-10 NOTE — Telephone Encounter (Signed)
Patient left message requesting results of mammogram and sleep study. Call back is [phone].   Danley Danker, RN [facility] RN)

## 2017-10-15 NOTE — Telephone Encounter (Signed)
Returned patient's call and had to leave a voicemail asking patient to call us back. Her mammogram was negative and did not show any evidence of breast cancer. Her sleep study showed that she had moderate to severe sleep apnea. She should follow-up with the sleep center to discuss a cpap machine.  Hyman Bible, MD PGY-3

## 2017-11-28 ENCOUNTER — Other Ambulatory Visit: Payer: Self-pay | Admitting: Internal Medicine

## 2017-11-28 DIAGNOSIS — R7303 Prediabetes: Secondary | ICD-10-CM

## 2017-11-28 DIAGNOSIS — I10 Essential (primary) hypertension: Secondary | ICD-10-CM

## 2018-01-05 ENCOUNTER — Other Ambulatory Visit: Payer: Self-pay | Admitting: *Deleted

## 2018-01-05 MED ORDER — CITALOPRAM HYDROBROMIDE 20 MG PO TABS: 20.0000 mg | ORAL_TABLET | Freq: Every evening | ORAL | 0 refills | Status: DC

## 2018-01-05 NOTE — Telephone Encounter (Signed)
Will prescribe a 3 month supply.  Based on pt's refill history, she does not appear to be taking her medication correctly.  I would like to see her before next refill.

## 2018-01-06 NOTE — Telephone Encounter (Signed)
appt made for 02-06-18. Kohana Amble,CMA

## 2018-01-20 ENCOUNTER — Other Ambulatory Visit: Payer: Self-pay

## 2018-01-21 MED ORDER — ATORVASTATIN CALCIUM 80 MG PO TABS: 80.0000 mg | ORAL_TABLET | Freq: Every day | ORAL | 0 refills | Status: DC

## 2018-01-21 MED ORDER — ALENDRONATE SODIUM 70 MG PO TABS: 70.0000 mg | ORAL_TABLET | ORAL | 1 refills | Status: DC

## 2018-01-21 NOTE — Telephone Encounter (Signed)
2nd request. Treyson Axel,CMA  

## 2018-02-06 ENCOUNTER — Ambulatory Visit: Payer: Medicare PPO | Admitting: Family Medicine

## 2018-03-08 ENCOUNTER — Other Ambulatory Visit: Payer: Self-pay | Admitting: Endocrinology

## 2018-03-08 DIAGNOSIS — E559 Vitamin D deficiency, unspecified: Secondary | ICD-10-CM

## 2018-03-08 DIAGNOSIS — E210 Primary hyperparathyroidism: Secondary | ICD-10-CM

## 2018-03-08 DIAGNOSIS — E039 Hypothyroidism, unspecified: Secondary | ICD-10-CM

## 2018-03-09 ENCOUNTER — Other Ambulatory Visit (INDEPENDENT_AMBULATORY_CARE_PROVIDER_SITE_OTHER): Payer: Medicare PPO

## 2018-03-09 DIAGNOSIS — E210 Primary hyperparathyroidism: Secondary | ICD-10-CM | POA: Diagnosis not present

## 2018-03-09 DIAGNOSIS — E559 Vitamin D deficiency, unspecified: Secondary | ICD-10-CM | POA: Diagnosis not present

## 2018-03-09 LAB — COMPREHENSIVE METABOLIC PANEL
ALT: 20 U/L (ref 0–35)
AST: 14 U/L (ref 0–37)
Albumin: 3.8 g/dL (ref 3.5–5.2)
Alkaline Phosphatase: 52 U/L (ref 39–117)
BUN: 18 mg/dL (ref 6–23)
CO2: 30 mEq/L (ref 19–32)
Calcium: 9.1 mg/dL (ref 8.4–10.5)
Chloride: 102 mEq/L (ref 96–112)
Creatinine, Ser: 0.73 mg/dL (ref 0.40–1.20)
GFR: 103.71 mL/min (ref >60.00)
Glucose, Bld: 178 mg/dL — ABNORMAL HIGH (ref 70–99)
Potassium: 3.9 mEq/L (ref 3.5–5.1)
Sodium: 138 mEq/L (ref 135–145)
Total Bilirubin: 0.3 mg/dL (ref 0.2–1.2)
Total Protein: 7.4 g/dL (ref 6.0–8.3)

## 2018-03-09 LAB — VITAMIN D 25 HYDROXY (VIT D DEFICIENCY, FRACTURES): VITD: 38.7 ng/mL (ref 30.00–100.00)

## 2018-03-12 ENCOUNTER — Encounter: Payer: Self-pay | Admitting: Endocrinology

## 2018-03-12 ENCOUNTER — Ambulatory Visit: Payer: Medicare PPO | Admitting: Endocrinology

## 2018-03-12 ENCOUNTER — Ambulatory Visit (INDEPENDENT_AMBULATORY_CARE_PROVIDER_SITE_OTHER): Payer: Medicare PPO | Admitting: Endocrinology

## 2018-03-12 VITALS — BP 126/86 | HR 90 | Ht 61.5 in | Wt 256.0 lb

## 2018-03-12 DIAGNOSIS — M810 Age-related osteoporosis without current pathological fracture: Secondary | ICD-10-CM | POA: Diagnosis not present

## 2018-03-12 NOTE — Patient Instructions (Signed)
No change 

## 2018-03-12 NOTE — Progress Notes (Signed)
Patient ID: Morgan Collins, female   DOB: 1956/03/09, 62 y.o.   MRN: [id number]           Chief complaint: osteoporosis   History of Present Illness:    She was initially evaluated for hyperparathyroidism in 8/16   Review of records show that she has had a high calcium since about 2014, she had upper normal level in 2013 and previous records are not available. Her calcium levels have previously ranged between 10.8-11.6 although relatively higher since 11/16 Also her PCP has stopped her HCTZ in 12/16  Her calcium was persistently high at 11.6 and she was referred for surgery She did have an abnormal scan showing the right inferior parathyroid adenoma but on surgery in 02/2017 she was also found to have a larger superior adenoma on the right side and this was removed also.  The right superior adenoma was 2 cm in size  Calcium has been consistently normal since surgery    Lab Results  Component Value Date   CALCIUM 9.1 03/09/2018   CALCIUM 9.6 08/20/2017   CALCIUM 9.7 04/23/2017   CALCIUM 9.2 04/11/2017   CALCIUM 9.6 03/04/2017   CALCIUM 10.6 (H) 02/12/2017   CALCIUM 11.6 (H) 12/02/2016   CALCIUM 11.5 (H) 08/23/2016   CALCIUM 10.5 02/26/2016     Prior Labs:  Lab Results  Component Value Date   PTH 123 (H) 10/11/2014   CALCIUM 9.1 03/09/2018   CAION 1.57 (H) 10/26/2014   PHOS 2.8 12/02/2016      25 (OH) Vitamin D level was 26 at baseline and was started on vitamin D3, 2000 units daily; recent levels have improved   Lab Results  Component Value Date   VD25OH 38.70 03/09/2018   OSTEOPOROSIS:  Bone mineral density done in 11/2014 showed T score of -3.5 at the spine but only -1.4 at the femur She apparently had oophorectomy in her 36s and her second ovary was removed at about the age of 89 because of a large cyst She has never been given hormone supplements  She was started on Actonel 150 mg monthly on her initial consultation in 8/16 Because of the expense this  was stopped  She has been on Fosamax that was started in March 2018  She has taken this weekly regularly without side effects  She was offered other options such as Reclast and Prolia but she refuses to take any injectable drugs      Allergies as of 03/12/2018   No Known Allergies     Medication List        Accurate as of 03/12/18  4:06 PM. Always use your most recent med list.          alendronate 70 MG tablet Commonly known as:  FOSAMAX Take 1 tablet (70 mg total) by mouth every 7 (seven) days. Take with a full glass of water on an empty stomach.   amLODipine 10 MG tablet Commonly known as:  NORVASC TAKE 1 TABLET EVERY DAY   aspirin EC 81 MG tablet Take 1 tablet (81 mg total) by mouth daily.   atorvastatin 80 MG tablet Commonly known as:  LIPITOR Take 1 tablet (80 mg total) by mouth daily.   B-12 PO Take 1 tablet by mouth daily.   citalopram 20 MG tablet Commonly known as:  CELEXA Take 1 tablet (20 mg total) by mouth every evening.   COD LIVER OIL PO Take 1 capsule by mouth daily.   furosemide 20 MG tablet Commonly  known as:  LASIX Take 1 tablet (20 mg total) by mouth daily.   levothyroxine 100 MCG tablet Commonly known as:  SYNTHROID, LEVOTHROID TAKE 1 TABLET EVERY DAY   losartan 100 MG tablet Commonly known as:  COZAAR TAKE 1 TABLET EVERY DAY   metFORMIN 500 MG tablet Commonly known as:  GLUCOPHAGE TAKE 1 TABLET TWICE DAILY WITH A MEAL   OMEGA 3 PO Take 1 capsule by mouth daily.   VITAMIN D (ERGOCALCIFEROL) PO Take 2 tablets by mouth daily.       Allergies: No Known Allergies  Past Medical History:  Diagnosis Date  . Complication of anesthesia    encephalitis in coma 23 days- trach -reversed-no issues now  . Depressed   . Diabetes mellitus without complication ([place])   . High cholesterol   . Hypertension   . Hypothyroidism   . Pre-diabetes   . Sleep apnea    do not use cpap  . Stroke [facility]) 2015   slower ,but not a problem; right  thalamic hemorrhage 07/2013  . Thyroid disease     Past Surgical History:  Procedure Laterality Date  . ABDOMINAL HYSTERECTOMY    . APPENDECTOMY    . c sections    . COLONOSCOPY WITH PROPOFOL N/A 12/30/2014   Procedure: COLONOSCOPY WITH PROPOFOL;  Surgeon: Carol Ada, MD;  Location: [facility];  Service: Endoscopy;  Laterality: N/A;  . PARATHYROIDECTOMY Right 02/18/2017   Procedure: RIGHT INFERIOR PARATHYROIDECTOMY;  Surgeon: Armandina Gemma, MD;  Location: [facility];  Service: General;  Laterality: Right;  . TRACHEOSTOMY     past history of early 20's- encephalitis coma for 23 days-1982    Family History  Problem Relation Age of Onset  . Congestive Heart Failure Mother   . Hypertension Mother   . Alcohol abuse Father   . Cancer Sister        Breast  . Dementia Brother     Social History:  reports that she has never smoked. She has never used smokeless tobacco. She reports that she does not drink alcohol or use drugs.  ROS    Hypothyroid since ? 2006 , Apparently received radioactive iodine treatment for hyperthyroidism at that time Her levothyroxine is being prescribed by her PCP but apparently she has not had a follow-up since her labs were done in July She has been on 100 g levothyroxine, needs follow-up  Lab Results  Component Value Date   TSH 2.410 03/17/2017   TSH 18.52 (H) 12/02/2016   TSH 1.015 04/21/2015   FREET4 0.39 (L) 12/02/2016   FREET4 1.17 03/03/2014     She is on metformin  for mild diabetes, Followed by PCP  Lab Results  Component Value Date   HGBA1C 7.5 08/20/2017   HGBA1C 6.7 (H) 02/12/2017   HGBA1C 6.6 05/15/2016   Lab Results  Component Value Date   LDLCALC 127 (H) 07/26/2014   CREATININE 0.73 03/09/2018       EXAM:  BP 126/86   Pulse 90   Ht 5' 1.5" (1.562 m)   Wt 256 lb (116.1 kg)   SpO2 98%   BMI 47.59 kg/m     Assessment/Plan:   HYPERPARATHYROIDISM: She has had successful parathyroidectomy with removal of 2 parathyroid  glands on the right side in 2018 Her current calcium levels have been consistently normal  OSTEOPOROSIS with T score - 3.5 at the spine Risk factors for this are premature menopause and hyperparathyroidism Currently she is on  We will need to make  sure that her alendronate treatment and her parathyroidectomy have had benefit on bone density Most likely will need to continue alendronate for a total of 5 years  Discussed that if her bone density is not improved may consider Prolia or other injectable drugs Otherwise she can continue follow-up with PCP on a regular basis  Diabetes and thyroid: She needs to have these reviewed with her PCP, currently established in family practice clinic  There are no Patient Instructions on file for this visit.   Elayne Snare 03/12/2018, 4:06 PM

## 2018-03-18 ENCOUNTER — Ambulatory Visit (INDEPENDENT_AMBULATORY_CARE_PROVIDER_SITE_OTHER)
Admission: RE | Admit: 2018-03-18 | Discharge: 2018-03-18 | Disposition: A | Discharge status: Home / Self Care | Payer: Medicare PPO | Source: Ambulatory Visit | Attending: Endocrinology | Admitting: Endocrinology

## 2018-03-18 DIAGNOSIS — M810 Age-related osteoporosis without current pathological fracture: Secondary | ICD-10-CM | POA: Diagnosis not present

## 2018-03-24 ENCOUNTER — Telehealth: Payer: Self-pay | Admitting: Endocrinology

## 2018-03-24 NOTE — Telephone Encounter (Signed)
Pt returned call regarding bone density results. Please call pt at [phone]

## 2018-03-25 NOTE — Telephone Encounter (Signed)
Pt wanting to know result of bone density. Please call pt.

## 2018-03-26 ENCOUNTER — Other Ambulatory Visit: Payer: Self-pay | Admitting: *Deleted

## 2018-03-26 DIAGNOSIS — R7303 Prediabetes: Secondary | ICD-10-CM

## 2018-03-26 MED ORDER — LOSARTAN POTASSIUM 100 MG PO TABS: 100.0000 mg | ORAL_TABLET | Freq: Every day | ORAL | 3 refills | Status: DC

## 2018-03-26 MED ORDER — METFORMIN HCL 500 MG PO TABS: 500.0000 mg | ORAL_TABLET | Freq: Two times a day (BID) | ORAL | 3 refills | Status: DC

## 2018-03-26 NOTE — Telephone Encounter (Signed)
Patient would like the results explained to her by MD

## 2018-03-27 ENCOUNTER — Other Ambulatory Visit: Payer: Self-pay

## 2018-04-01 ENCOUNTER — Other Ambulatory Visit: Payer: Self-pay | Admitting: Family Medicine

## 2018-04-02 NOTE — Telephone Encounter (Signed)
This has been done.

## 2018-04-06 MED ORDER — CITALOPRAM HYDROBROMIDE 20 MG PO TABS: 20.0000 mg | ORAL_TABLET | Freq: Every evening | ORAL | 1 refills | Status: DC

## 2018-04-09 ENCOUNTER — Telehealth: Payer: Self-pay | Admitting: Endocrinology

## 2018-04-09 NOTE — Telephone Encounter (Signed)
Patient is calling stating they are out of refills on there bone medication Dr. Dwyane Dee prescribed. They believe it starts with an 'R". Please Advise. [facility]  Patient wants to know if they can get a RX on Vitamin D also.

## 2018-04-10 ENCOUNTER — Encounter: Payer: Self-pay | Admitting: Family Medicine

## 2018-04-10 ENCOUNTER — Encounter

## 2018-04-10 ENCOUNTER — Other Ambulatory Visit: Payer: Self-pay

## 2018-04-10 ENCOUNTER — Ambulatory Visit: Payer: Medicare PPO | Admitting: Family Medicine

## 2018-04-10 VITALS — BP 108/70 | HR 92 | Temp 98.7°F | Ht 62.0 in | Wt 257.4 lb

## 2018-04-10 DIAGNOSIS — M79651 Pain in right thigh: Secondary | ICD-10-CM | POA: Diagnosis not present

## 2018-04-10 DIAGNOSIS — G4733 Obstructive sleep apnea (adult) (pediatric): Secondary | ICD-10-CM

## 2018-04-10 DIAGNOSIS — F331 Major depressive disorder, recurrent, moderate: Secondary | ICD-10-CM | POA: Diagnosis not present

## 2018-04-10 DIAGNOSIS — Z860100 Personal history of colon polyps, unspecified: Secondary | ICD-10-CM | POA: Insufficient documentation

## 2018-04-10 DIAGNOSIS — E119 Type 2 diabetes mellitus without complications: Secondary | ICD-10-CM

## 2018-04-10 DIAGNOSIS — G5601 Carpal tunnel syndrome, right upper limb: Secondary | ICD-10-CM | POA: Insufficient documentation

## 2018-04-10 HISTORY — DX: Personal history of colon polyps, unspecified: Z86.0100

## 2018-04-10 HISTORY — DX: Personal history of colonic polyps: Z86.010

## 2018-04-10 LAB — POCT GLYCOSYLATED HEMOGLOBIN (HGB A1C): HbA1c, POC (controlled diabetic range): 6.7 % (ref 0.0–7.0)

## 2018-04-10 MED ORDER — CITALOPRAM HYDROBROMIDE 20 MG PO TABS: 20.0000 mg | ORAL_TABLET | Freq: Every evening | ORAL | 1 refills | Status: DC

## 2018-04-10 NOTE — Assessment & Plan Note (Signed)
She was encouraged to follow-up with her GI doctor for a repeat colonoscopy.  It was explained to her that she is at an increased risk of polyps and the development of polyps into colon cancer.  For that reason it is important that she be screened more often than the typical 10-year recommendation.  She acknowledged understanding of why this additional screening is necessary and agreed to follow-up.

## 2018-04-10 NOTE — Assessment & Plan Note (Signed)
Differential includes: Early osteoarthritis, lateral femoral cutaneous nerve impingement, sciatica.  Early osteoarthritis appears to be most likely based on exam this visit.  At this time we will continue to monitor this thigh/lower abdomen abdomen pain. -Tylenol for pain management (no NSAIDs due to CVA)

## 2018-04-10 NOTE — Assessment & Plan Note (Addendum)
Well-controlled.  A1c today was 6.7. -Encourage diet and exercise -Continue metformin 500 mg twice daily

## 2018-04-10 NOTE — Patient Instructions (Addendum)
It was great to meet you today.  We talked about a lot of problems today.  Here are some quick notes:  Sleep study - I put in a referral to the sleep clinic to do the next test for titration.  Call me if you do not hear from them in the next two weeks.  Hand numbness - I gave you a prescription for cock up wrist splints. Take it to [facility] at [address] [place], [place] [address]. Feel free to compare the price with the over the counter splint at [facility] or other pharmacy. Come back for an appointment in 4-6 weeks.  Thigh/lower abdomen pain - possibly early osteoarthritis. Let's control with tylenol for now and keep an eye on it.  Colonoscopy - follow up with the doctor who did your last colonoscopy, it's important!  Diabetes type two - Great job taking your metformin, it's working!

## 2018-04-10 NOTE — Assessment & Plan Note (Signed)
Although Tinel's test and Phalen's test were negative in the office is still likely that she is experiencing a recurrence of her carpal tunnel syndrome. -Cock-up wrist splints for 6 to 8 weeks -We will reevaluate in clinic in about 6 weeks during your follow-up visit

## 2018-04-10 NOTE — Progress Notes (Signed)
Subjective:  Morgan Collins is a 62 y.o. female who presents to the [facility] today with a chief complaint of Right hand numbness.   HPI: Sleep study: Morgan Collins last sleep study was done on 09/2017.  She is confused as to the results of her study.  She wanted know what the results were and if she needed to follow-up.  She knows that she probably needs a CPAP but is not sure how to get one and would like guidance.  Diabetes: Her last A1c from 08/2017 was 7.5.  Since that time she has been taking metformin 500 mg 2 times daily.  Her A1c was checked again in clinic today.  For the past year or 2 she has been more aware of her health and the like to pay more attention to it to stay healthy so that she can see her grandchildren graduate high school.  She knows that she needs to spend more time exercising and paying attention to her diet.  She had questions about a ketogenic diet that her friends were doing.  Spoke about it briefly she decided that she would not like to start it now.  Right hand numbness: For the past 2 weeks, she has woken up in the morning to find her right hand numb.  She goes about her day the numbness abates and her sensation returns to normal.  She is not having any numbness in the clinic.  She is not sure if it involves her pinky finger or not.  She does have a previous medical history of carpal tunnel syndrome bilaterally.  She did have one release of her right carpal tunnel in her 37s.  She recalls getting significant relief following the procedure and has not been an issue since.  She is not aware of any history of osteoarthritis, there is been no recent trauma to her hand, she is not experiencing any neck pain.  Right thigh/abdomen pain: For roughly the past month, she has been experiencing some shooting pain on the outside of her right thigh.  She also has this pain at times on her right lower abdomen.  The pain does not radiate down the back of her leg or her calf.  It is not  associated with any pain in her back.  The pain in her abdomen and thigh do not always occur together.  She has not noticed any association with movement or walking.  She has no history of osteoarthritis.  She does not take any medication to help with the shooting pain as it is not consistent enough to merit pain medication.  She denies wearing tight waisted clothing.  Colonoscopy: Her last colonoscopy was in 2016.  At that time multiple polyps were removed which were later shown to be benign.  She was recommended to have a repeat colonoscopy in 3-5 years.  She wanted to know it was necessary for her to go to this colonoscopy.  She is concerned that her GI doctor simply would like to make more money.   Objective:  Physical Exam: BP 108/70   Pulse 92   Temp 98.7 F (37.1 C) (Oral)   Ht 5\' 2"  (1.575 m)   Wt 257 lb 6.4 oz (116.8 kg)   SpO2 96%   BMI 47.08 kg/m   Gen: NAD, resting comfortably, was able to walk small distances around the room and step up to the table with minimal discomfort.  She is morbidly obese did have mild difficulty but was able  to perform all tasks during her exam. Right hand: She did not have any right hand numbness at the time of the exam.  Tinel's test did not exacerbate any numbness.  Phalen's test did not exacerbate any numbness.  Tapping on her ulnar nerve at medial epicondyle did not exacerbate numbness. Lower extremities: Straight leg raise negative bilaterally, no pain with internal or external rotation of the hips. Lower back: Nontender to palpation of the sacroiliac joints bilaterally.  No spinal or paraspinal tenderness. Mood: Mildly emotional/tearful during encounter, appropriate  Results for orders placed or performed in visit on 04/10/18 (from the past 72 hour(s))  POCT glycosylated hemoglobin (Hb A1C)     Status: None   Collection Time: 04/10/18  3:48 PM  Result Value Ref Range   Hemoglobin A1C     HbA1c POC (<> result, manual entry)     HbA1c, POC  (prediabetic range)     HbA1c, POC (controlled diabetic range) 6.7 0.0 - 7.0 %     Assessment/Plan:  OSA (obstructive sleep apnea) The note pertaining to her sleep study on 09/2017 recommended further sleep study for titration of CPAP.  Referral was put in for CPAP study titration.  Morgan Collins was encouraged to call back to the clinic if she did not hear from the sleep study center in the next 2 weeks.  Type 2 diabetes mellitus, controlled ([place]) Well-controlled.  A1c today was 6.7. -Encourage diet and exercise -Continue metformin 500 mg twice daily  Carpal tunnel syndrome of right wrist Although Tinel's test and Phalen's test were negative in the office is still likely that she is experiencing a recurrence of her carpal tunnel syndrome. -Cock-up wrist splints for 6 to 8 weeks -We will reevaluate in clinic in about 6 weeks during your follow-up visit  Right thigh pain Differential includes: Early osteoarthritis, lateral femoral cutaneous nerve impingement, sciatica.  Early osteoarthritis appears to be most likely based on exam this visit.  At this time we will continue to monitor this thigh/lower abdomen abdomen pain. -Tylenol for pain management (no NSAIDs due to CVA)  History of colonic polyps She was encouraged to follow-up with her GI doctor for a repeat colonoscopy.  It was explained to her that she is at an increased risk of polyps and the development of polyps into colon cancer.  For that reason it is important that she be screened more often than the typical 10-year recommendation.  She acknowledged understanding of why this additional screening is necessary and agreed to follow-up.

## 2018-04-10 NOTE — Assessment & Plan Note (Signed)
The note pertaining to her sleep study on 09/2017 recommended further sleep study for titration of CPAP.  Referral was put in for CPAP study titration.  Morgan Collins was encouraged to call back to the clinic if she did not hear from the sleep study center in the next 2 weeks.

## 2018-04-14 ENCOUNTER — Other Ambulatory Visit: Payer: Self-pay | Admitting: Endocrinology

## 2018-04-14 ENCOUNTER — Telehealth: Payer: Self-pay | Admitting: Endocrinology

## 2018-04-14 MED ORDER — VITAMIN D (ERGOCALCIFEROL) 50 MCG (2000 UT) PO CAPS: 2.0000 | ORAL_CAPSULE | Freq: Every day | ORAL | 3 refills | Status: DC

## 2018-04-14 MED ORDER — ALENDRONATE SODIUM 70 MG PO TABS: 70.0000 mg | ORAL_TABLET | ORAL | 1 refills | Status: DC

## 2018-04-14 NOTE — Telephone Encounter (Signed)
Done sent Rx to [facility]

## 2018-04-14 NOTE — Telephone Encounter (Signed)
Patient is returning call about RX.

## 2018-04-14 NOTE — Telephone Encounter (Signed)
LVM--to call office back regarding Rx medication request.

## 2018-04-14 NOTE — Telephone Encounter (Signed)
This been done.

## 2018-04-14 NOTE — Telephone Encounter (Signed)
Pt requesting Rx Alendronate--last refilled  by DrMarland Kitchen kumar 2018 and Vitamin D....Marland Kitchenplease advise

## 2018-04-14 NOTE — Telephone Encounter (Signed)
Ok to refill both meds   Thank you

## 2018-04-21 ENCOUNTER — Other Ambulatory Visit: Payer: Self-pay

## 2018-04-21 NOTE — Telephone Encounter (Signed)
Fax from pharmacy for these items. Not on current med list.  Danley Danker, RN [facility] RN)

## 2018-04-22 ENCOUNTER — Other Ambulatory Visit: Payer: Self-pay

## 2018-04-22 DIAGNOSIS — I10 Essential (primary) hypertension: Secondary | ICD-10-CM

## 2018-04-22 MED ORDER — ATORVASTATIN CALCIUM 80 MG PO TABS: 80.0000 mg | ORAL_TABLET | Freq: Every day | ORAL | 1 refills | Status: DC

## 2018-04-22 MED ORDER — LEVOTHYROXINE SODIUM 100 MCG PO TABS: 100.0000 ug | ORAL_TABLET | Freq: Every day | ORAL | 0 refills | Status: DC

## 2018-04-22 MED ORDER — AMLODIPINE BESYLATE 10 MG PO TABS: 10.0000 mg | ORAL_TABLET | Freq: Every day | ORAL | 0 refills | Status: DC

## 2018-04-28 ENCOUNTER — Other Ambulatory Visit: Payer: Self-pay

## 2018-05-06 ENCOUNTER — Encounter: Payer: Self-pay | Admitting: Family Medicine

## 2018-05-06 ENCOUNTER — Ambulatory Visit: Payer: Medicare PPO | Admitting: Family Medicine

## 2018-05-06 VITALS — BP 125/80 | HR 81 | Temp 98.1°F | Wt 251.4 lb

## 2018-05-06 DIAGNOSIS — Z23 Encounter for immunization: Secondary | ICD-10-CM

## 2018-05-06 DIAGNOSIS — G5601 Carpal tunnel syndrome, right upper limb: Secondary | ICD-10-CM | POA: Diagnosis not present

## 2018-05-06 DIAGNOSIS — Z860100 Personal history of colon polyps, unspecified: Secondary | ICD-10-CM

## 2018-05-06 DIAGNOSIS — G4733 Obstructive sleep apnea (adult) (pediatric): Secondary | ICD-10-CM

## 2018-05-06 NOTE — Assessment & Plan Note (Signed)
She has not picked up her wrist splints but feels that her right hand has been improving.  She is not interested in obtaining response at this time.  She was encouraged to address the issue again if it became bothersome.

## 2018-05-06 NOTE — Patient Instructions (Signed)
Great to see you today, Ms. Baird Cancer! I put in an order for your CPAP.  [facility] will [facility] (by [place]) and you will get a call in the next week or so about the CPAP. I'm sorry that this is taking so long.  Please contact your GI doctor about the colonoscopy.

## 2018-05-06 NOTE — Assessment & Plan Note (Signed)
In order to circumvent the need for second study for titration of her home CPAP, an order was put in for AutoPap.  Home health will be following up with Morgan Collins.  She was encouraged to call the office if she had not heard in the next week or so. -AutoPap ordered

## 2018-05-06 NOTE — Progress Notes (Signed)
    Subjective:  Morgan Collins is a 62 y.o. female who presents to the [facility] today for a follow up visit.   HPI: Carpal Tunnel Syndrome: Morgan Collins was last seen on 10/25 for small variety of reasons and was following up today regarding symptoms at her previous visit which were concerning for carpal tunnel syndrome.  Since our last appointment, she was not able to get a hold of hand splints but reports that she has been sleeping more comfortably at night with less hand tingling and numbness.  At this time, she is content with her hand function and is not interested in obtaining new hand splints.  OSA: At her previous visit, we spoke about her obstructive sleep apnea.  She underwent a sleep study this past summer which confirmed her obstructive sleep apnea with nocturnal hypoxemia.  The sleep study note mention that she now needs a second study for titration of her home CPAP.  Referral was put in the last visit but she has not heard anything in the way of follow-up.  She would like to know if her next steps are in obtaining a CPAP for home and getting it appropriately titrated.  Colonoscopy: 3 years ago, she had colonic polyps removed during a colonoscopy.  She is now due for a repeat colonoscopy and wanted to know she needed a referral.   Objective:  Physical Exam: BP 125/80   Pulse 81   Temp 98.1 F (36.7 C) (Oral)   Wt 251 lb 6.4 oz (114 kg)   SpO2 97%   BMI 45.98 kg/m    Gen: NAD, resting comfortably MSK: no edema, cyanosis, or clubbing noted Skin: warm, dry Neuro: grossly normal, moves all extremities Psych: Normal affect and thought content  No results found for this or any previous visit (from the past 72 hour(s)).   Assessment/Plan:  OSA (obstructive sleep apnea) In order to circumvent the need for second study for titration of her home CPAP, an order was put in for AutoPap.  Home health will be following up with Morgan Collins.  She was encouraged to call the office if she  had not heard in the next week or so. -AutoPap ordered  Carpal tunnel syndrome of right wrist She has not picked up her wrist splints but feels that her right hand has been improving.  She is not interested in obtaining response at this time.  She was encouraged to address the issue again if it became bothersome.  History of colonic polyps She was encouraged to call the GI doctor who previously performed her colonoscopy encouraged to schedule her repeat colonoscopy.  She was informed that no new referral is necessary.

## 2018-05-06 NOTE — Assessment & Plan Note (Signed)
She was encouraged to call the GI doctor who previously performed her colonoscopy encouraged to schedule her repeat colonoscopy.  She was informed that no new referral is necessary.

## 2018-05-25 ENCOUNTER — Other Ambulatory Visit: Payer: Self-pay | Admitting: Gastroenterology

## 2018-05-25 DIAGNOSIS — Z86010 Personal history of colonic polyps: Secondary | ICD-10-CM | POA: Diagnosis not present

## 2018-05-25 DIAGNOSIS — I10 Essential (primary) hypertension: Secondary | ICD-10-CM | POA: Diagnosis not present

## 2018-05-25 DIAGNOSIS — E119 Type 2 diabetes mellitus without complications: Secondary | ICD-10-CM | POA: Diagnosis not present

## 2018-06-29 ENCOUNTER — Other Ambulatory Visit: Payer: Self-pay | Admitting: Family Medicine

## 2018-06-29 DIAGNOSIS — I10 Essential (primary) hypertension: Secondary | ICD-10-CM

## 2018-07-01 ENCOUNTER — Other Ambulatory Visit: Payer: Self-pay

## 2018-07-01 ENCOUNTER — Encounter (HOSPITAL_COMMUNITY): Payer: Self-pay

## 2018-07-03 ENCOUNTER — Encounter (HOSPITAL_COMMUNITY)
Admission: RE | Disposition: A | Discharge status: Home / Self Care | Payer: Self-pay | Source: Home / Self Care | Attending: Gastroenterology

## 2018-07-03 ENCOUNTER — Ambulatory Visit (HOSPITAL_COMMUNITY): Payer: Medicare PPO | Admitting: Certified Registered Nurse Anesthetist

## 2018-07-03 ENCOUNTER — Encounter (HOSPITAL_COMMUNITY): Payer: Self-pay | Admitting: *Deleted

## 2018-07-03 ENCOUNTER — Ambulatory Visit (HOSPITAL_COMMUNITY)
Admission: RE | Admit: 2018-07-03 | Discharge: 2018-07-03 | Disposition: A | Discharge status: Home / Self Care | Payer: Medicare PPO | Attending: Gastroenterology | Admitting: Gastroenterology

## 2018-07-03 ENCOUNTER — Other Ambulatory Visit: Payer: Self-pay

## 2018-07-03 DIAGNOSIS — Z7984 Long term (current) use of oral hypoglycemic drugs: Secondary | ICD-10-CM | POA: Insufficient documentation

## 2018-07-03 DIAGNOSIS — E7800 Pure hypercholesterolemia, unspecified: Secondary | ICD-10-CM | POA: Diagnosis not present

## 2018-07-03 DIAGNOSIS — D123 Benign neoplasm of transverse colon: Secondary | ICD-10-CM | POA: Insufficient documentation

## 2018-07-03 DIAGNOSIS — Z86010 Personal history of colonic polyps: Secondary | ICD-10-CM | POA: Insufficient documentation

## 2018-07-03 DIAGNOSIS — D120 Benign neoplasm of cecum: Secondary | ICD-10-CM | POA: Insufficient documentation

## 2018-07-03 DIAGNOSIS — D124 Benign neoplasm of descending colon: Secondary | ICD-10-CM | POA: Insufficient documentation

## 2018-07-03 DIAGNOSIS — I10 Essential (primary) hypertension: Secondary | ICD-10-CM | POA: Insufficient documentation

## 2018-07-03 DIAGNOSIS — Z7983 Long term (current) use of bisphosphonates: Secondary | ICD-10-CM | POA: Diagnosis not present

## 2018-07-03 DIAGNOSIS — F329 Major depressive disorder, single episode, unspecified: Secondary | ICD-10-CM | POA: Insufficient documentation

## 2018-07-03 DIAGNOSIS — Z7982 Long term (current) use of aspirin: Secondary | ICD-10-CM | POA: Insufficient documentation

## 2018-07-03 DIAGNOSIS — E119 Type 2 diabetes mellitus without complications: Secondary | ICD-10-CM | POA: Insufficient documentation

## 2018-07-03 DIAGNOSIS — Z8673 Personal history of transient ischemic attack (TIA), and cerebral infarction without residual deficits: Secondary | ICD-10-CM | POA: Diagnosis not present

## 2018-07-03 DIAGNOSIS — Z79890 Hormone replacement therapy (postmenopausal): Secondary | ICD-10-CM | POA: Insufficient documentation

## 2018-07-03 DIAGNOSIS — E039 Hypothyroidism, unspecified: Secondary | ICD-10-CM | POA: Insufficient documentation

## 2018-07-03 DIAGNOSIS — D128 Benign neoplasm of rectum: Secondary | ICD-10-CM | POA: Diagnosis not present

## 2018-07-03 DIAGNOSIS — Z79899 Other long term (current) drug therapy: Secondary | ICD-10-CM | POA: Insufficient documentation

## 2018-07-03 DIAGNOSIS — Z1211 Encounter for screening for malignant neoplasm of colon: Secondary | ICD-10-CM | POA: Diagnosis not present

## 2018-07-03 HISTORY — PX: POLYPECTOMY: SHX5525

## 2018-07-03 HISTORY — PX: COLONOSCOPY WITH PROPOFOL: SHX5780

## 2018-07-03 LAB — GLUCOSE, CAPILLARY: Glucose-Capillary: 81 mg/dL (ref 70–99)

## 2018-07-03 SURGERY — COLONOSCOPY WITH PROPOFOL
Anesthesia: Monitor Anesthesia Care

## 2018-07-03 MED ORDER — SODIUM CHLORIDE 0.9 % IV SOLN: INTRAVENOUS | Status: DC

## 2018-07-03 MED ORDER — PROPOFOL 500 MG/50ML IV EMUL
INTRAVENOUS | Status: DC | PRN
  Administered 2018-07-03: 100 ug/kg/min via INTRAVENOUS
  Administered 2018-07-03: 150 ug/kg/min via INTRAVENOUS

## 2018-07-03 MED ORDER — PROPOFOL 10 MG/ML IV BOLUS
INTRAVENOUS | Status: AC
  Filled 2018-07-03: qty 40

## 2018-07-03 MED ORDER — PROPOFOL 10 MG/ML IV BOLUS
INTRAVENOUS | Status: DC | PRN
  Administered 2018-07-03: 20 mg via INTRAVENOUS

## 2018-07-03 MED ORDER — LIDOCAINE 2% (20 MG/ML) 5 ML SYRINGE
INTRAMUSCULAR | Status: DC | PRN
  Administered 2018-07-03: 100 mg via INTRAVENOUS

## 2018-07-03 MED ORDER — LACTATED RINGERS IV SOLN
INTRAVENOUS | Status: DC
  Administered 2018-07-03: 13:00:00 via INTRAVENOUS

## 2018-07-03 MED ORDER — PHENYLEPHRINE 40 MCG/ML (10ML) SYRINGE FOR IV PUSH (FOR BLOOD PRESSURE SUPPORT)
PREFILLED_SYRINGE | INTRAVENOUS | Status: DC | PRN
  Administered 2018-07-03: 120 ug via INTRAVENOUS
  Administered 2018-07-03: 80 ug via INTRAVENOUS

## 2018-07-03 MED ORDER — PROPOFOL 10 MG/ML IV BOLUS
INTRAVENOUS | Status: AC
  Filled 2018-07-03: qty 20

## 2018-07-03 SURGICAL SUPPLY — 22 items

## 2018-07-03 NOTE — Anesthesia Postprocedure Evaluation (Signed)
Anesthesia Post Note  Patient: Morgan Collins  Procedure(s) Performed: COLONOSCOPY WITH PROPOFOL (N/A ) POLYPECTOMY     Patient location during evaluation: Endoscopy Anesthesia Type: MAC Level of consciousness: awake and alert Pain management: pain level controlled Vital Signs Assessment: post-procedure vital signs reviewed and stable Respiratory status: spontaneous breathing, nonlabored ventilation, respiratory function stable and patient connected to nasal cannula oxygen Cardiovascular status: stable and blood pressure returned to baseline Postop Assessment: no apparent nausea or vomiting Anesthetic complications: no    Last Vitals:  Vitals:   07/03/18 1330 07/03/18 1340  BP: (!) 108/56 125/75  Pulse: 88 68  Resp: (!) 22 18  Temp:    SpO2: 97% 99%    Last Pain:  Vitals:   07/03/18 1340  TempSrc:   PainSc: 0-No pain                 Montez Hageman

## 2018-07-03 NOTE — Anesthesia Preprocedure Evaluation (Signed)
Anesthesia Evaluation  Patient identified by MRN, date of birth, ID band Patient awake    Reviewed: Allergy & Precautions, NPO status , Patient's Chart, lab work & pertinent test results  Airway Mallampati: II  TM Distance: >3 FB Neck ROM: Full    Dental no notable dental hx.    Pulmonary sleep apnea ,    Pulmonary exam normal breath sounds clear to auscultation       Cardiovascular hypertension, Normal cardiovascular exam Rhythm:Regular Rate:Normal     Neuro/Psych CVA negative psych ROS   GI/Hepatic negative GI ROS, Neg liver ROS,   Endo/Other  diabetes, Type 2  Renal/GU negative Renal ROS  negative genitourinary   Musculoskeletal negative musculoskeletal ROS (+)   Abdominal   Peds negative pediatric ROS (+)  Hematology negative hematology ROS (+)   Anesthesia Other Findings   Reproductive/Obstetrics negative OB ROS                             Anesthesia Physical Anesthesia Plan  ASA: III  Anesthesia Plan: MAC   Post-op Pain Management:    Induction: Intravenous  PONV Risk Score and Plan: 2 and Treatment may vary due to age or medical condition  Airway Management Planned: Nasal Cannula and Simple Face Mask  Additional Equipment:   Intra-op Plan:   Post-operative Plan:   Informed Consent: I have reviewed the patients History and Physical, chart, labs and discussed the procedure including the risks, benefits and alternatives for the proposed anesthesia with the patient or authorized representative who has indicated his/her understanding and acceptance.     Dental advisory given  Plan Discussed with: CRNA  Anesthesia Plan Comments:         Anesthesia Quick Evaluation

## 2018-07-03 NOTE — H&P (Signed)
  Veleta Miners HPI: At this time the patient denies any problems with nausea, vomiting, fevers, chills, abdominal pain, diarrhea, constipation, hematochezia, melena, GERD, or dysphagia. The patient denies any known family history of colon cancers. No complaints of chest pain, SOB, MI, or sleep apnea. Her colonoscopy on 12/30/2014 was positive for 3 adenomas.   Past Medical History:  Diagnosis Date  . Complication of anesthesia    encephalitis in coma 23 days- trach -reversed-no issues now  . Depressed   . Diabetes mellitus without complication ([place])    type 2  . High cholesterol   . Hypertension   . Hypothyroidism   . Sleep apnea    do not use cpap  . Stroke [facility]) 2015   slower ,but not a problem; right thalamic hemorrhage 07/2013  . Thyroid disease     Past Surgical History:  Procedure Laterality Date  . ABDOMINAL HYSTERECTOMY    . APPENDECTOMY    . c sections    . COLONOSCOPY WITH PROPOFOL N/A 12/30/2014   Procedure: COLONOSCOPY WITH PROPOFOL;  Surgeon: Carol Ada, MD;  Location: [facility];  Service: Endoscopy;  Laterality: N/A;  . PARATHYROIDECTOMY Right 02/18/2017   Procedure: RIGHT INFERIOR PARATHYROIDECTOMY;  Surgeon: Armandina Gemma, MD;  Location: [place];  Service: General;  Laterality: Right;  . TRACHEOSTOMY     past history of early 20's- encephalitis coma for 23 days-1982    Family History  Problem Relation Age of Onset  . Congestive Heart Failure Mother   . Hypertension Mother   . Alcohol abuse Father   . Cancer Sister        Breast  . Dementia Brother     Social History:  reports that she has never smoked. She has never used smokeless tobacco. She reports that she does not drink alcohol or use drugs.  Allergies: No Known Allergies  Medications:  Scheduled:  Continuous: . sodium chloride    . lactated ringers      No results found for this or any previous visit (from the past 24 hour(s)).   No results found.  ROS:  As stated above in the HPI  otherwise negative.  Blood pressure (!) 131/91, pulse 80, temperature 97.8 F (36.6 C), temperature source Oral, resp. rate 19, height 5\' 1"  (1.549 m), weight 112 kg, SpO2 97 %.    PE: Gen: NAD, Alert and Oriented HEENT:  [place]/AT, EOMI Neck: Supple, no LAD Lungs: CTA Bilaterally CV: RRR without M/G/R ABM: Soft, NTND, +BS Ext: No C/C/E  Assessment/Plan: 1) Personal history of polyps - colonoscopy.  Valborg Friar D 07/03/2018, 11:40 AM

## 2018-07-03 NOTE — Discharge Instructions (Signed)

## 2018-07-03 NOTE — Op Note (Signed)
[facility] Patient Name: Morgan Collins Procedure Date: 07/03/2018 MRN: [id number] Attending MD: Carol Ada , MD Date of Birth: 12-11-1955 CSN: [id number] Age: 63 Admit Type: Outpatient Procedure:                Colonoscopy Indications:              High risk colon cancer surveillance: Personal                            history of colonic polyps Providers:                Carol Ada, MD, Vista Lawman, RN, Cletis Athens,                            Technician Referring MD:              Medicines:                Propofol per Anesthesia Complications:            No immediate complications. Estimated Blood Loss:     Estimated blood loss was minimal. Procedure:                Pre-Anesthesia Assessment:                           - Prior to the procedure, a History and Physical                            was performed, and patient medications and                            allergies were reviewed. The patient's tolerance of                            previous anesthesia was also reviewed. The risks                            and benefits of the procedure and the sedation                            options and risks were discussed with the patient.                            All questions were answered, and informed consent                            was obtained. Prior Anticoagulants: The patient has                            taken no previous anticoagulant or antiplatelet                            agents. ASA Grade Assessment: III - A patient with                            severe  systemic disease. After reviewing the risks                            and benefits, the patient was deemed in                            satisfactory condition to undergo the procedure.                           - Sedation was administered by an anesthesia                            professional. Deep sedation was attained.                           After obtaining informed consent, the  colonoscope                            was passed under direct vision. Throughout the                            procedure, the patient's blood pressure, pulse, and                            oxygen saturations were monitored continuously. The                            CF-HQ190L ([id number]) Olympus Adult Colonoscope was                            introduced through the anus and advanced to the the                            cecum, identified by appendiceal orifice and                            ileocecal valve. The colonoscopy was performed                            without difficulty. The patient tolerated the                            procedure well. The quality of the bowel                            preparation was excellent. The ileocecal valve,                            appendiceal orifice, and rectum were photographed. Scope In: 26:71:24 PM Scope Out: 1:16:59 PM Scope Withdrawal Time: 0 hours 17 minutes 40 seconds  Total Procedure Duration: 0 hours 20 minutes 3 seconds  Findings:      Four sessile polyps were found in the rectum, descending colon,       transverse colon and cecum. The polyps were 1 to 2  mm in size. These       polyps were removed with a cold snare. Resection and retrieval were       complete. Impression:               - Four 1 to 2 mm polyps in the rectum, in the                            descending colon, in the transverse colon and in                            the cecum, removed with a cold snare. Resected and                            retrieved. Moderate Sedation:      Not Applicable - Patient had care per Anesthesia. Recommendation:           - Patient has a contact number available for                            emergencies. The signs and symptoms of potential                            delayed complications were discussed with the                            patient. Return to normal activities tomorrow.                            Written discharge  instructions were provided to the                            patient.                           - Resume previous diet.                           - Continue present medications.                           - Await pathology results.                           - Repeat colonoscopy in 3 years for surveillance. Procedure Code(s):        --- Professional ---                           [phone], Colonoscopy, flexible; with removal of                            tumor(s), polyp(s), or other lesion(s) by snare                            technique Diagnosis Code(s):        --- Professional ---  K62.1, Rectal polyp                           Z86.010, Personal history of colonic polyps                           D12.4, Benign neoplasm of descending colon                           D12.3, Benign neoplasm of transverse colon (hepatic                            flexure or splenic flexure)                           D12.0, Benign neoplasm of cecum CPT copyright 2018 American Medical Association. All rights reserved. The codes documented in this report are preliminary and upon coder review may  be revised to meet current compliance requirements. Carol Ada, MD Carol Ada, MD 07/03/2018 1:20:14 PM This report has been signed electronically. Number of Addenda: 0

## 2018-07-03 NOTE — Anesthesia Procedure Notes (Signed)
Procedure Name: MAC Date/Time: 07/03/2018 12:44 PM Performed by: West Pugh, CRNA Pre-anesthesia Checklist: Patient identified, Emergency Drugs available, Suction available, Patient being monitored and Timeout performed Patient Re-evaluated:Patient Re-evaluated prior to induction Oxygen Delivery Method: Nasal cannula Preoxygenation: Pre-oxygenation with 100% oxygen Induction Type: IV induction Placement Confirmation: positive ETCO2 Dental Injury: Teeth and Oropharynx as per pre-operative assessment

## 2018-07-03 NOTE — Transfer of Care (Signed)
Immediate Anesthesia Transfer of Care Note  Patient: Morgan Collins  Procedure(s) Performed: COLONOSCOPY WITH PROPOFOL (N/A ) POLYPECTOMY  Patient Location: PACU  Anesthesia Type:MAC  Level of Consciousness: awake, alert , oriented and patient cooperative  Airway & Oxygen Therapy: Patient Spontanous Breathing and Patient connected to face mask oxygen  Post-op Assessment: Report given to RN and Post -op Vital signs reviewed and stable  Post vital signs: Reviewed and stable  Last Vitals:  Vitals Value Taken Time  BP 104/52 07/03/2018  1:27 PM  Temp    Pulse 78 07/03/2018  1:28 PM  Resp 20 07/03/2018  1:28 PM  SpO2 96 % 07/03/2018  1:28 PM  Vitals shown include unvalidated device data.  Last Pain:  Vitals:   07/03/18 1129  TempSrc: Oral  PainSc: 0-No pain         Complications: No apparent anesthesia complications

## 2018-07-06 ENCOUNTER — Encounter (HOSPITAL_COMMUNITY): Payer: Self-pay | Admitting: Gastroenterology

## 2018-07-10 ENCOUNTER — Ambulatory Visit: Payer: Medicare PPO | Admitting: Family Medicine

## 2018-07-15 ENCOUNTER — Other Ambulatory Visit: Payer: Self-pay | Admitting: Family Medicine

## 2018-07-15 ENCOUNTER — Telehealth: Payer: Self-pay | Admitting: Family Medicine

## 2018-07-15 DIAGNOSIS — G4733 Obstructive sleep apnea (adult) (pediatric): Secondary | ICD-10-CM

## 2018-07-15 NOTE — Telephone Encounter (Signed)
Community message sent to [facility].  Will forward to MD. Morgan Collins

## 2018-07-15 NOTE — Telephone Encounter (Signed)
Pt is calling to check on the status of her receiving a cpap machine. Pt said that she spoke with [facility] and they have not received an order for the machine.   Pt said that she has been very confused during this process because "every place I call tells me to call someone else"  She would like Dr. Pilar Plate to call her at [phone].

## 2018-07-20 ENCOUNTER — Ambulatory Visit: Payer: Medicare PPO | Admitting: Family Medicine

## 2018-07-20 ENCOUNTER — Encounter: Payer: Self-pay | Admitting: Family Medicine

## 2018-07-20 ENCOUNTER — Other Ambulatory Visit: Payer: Self-pay

## 2018-07-20 VITALS — BP 112/74 | HR 84 | Temp 98.2°F | Ht 61.0 in | Wt 249.8 lb

## 2018-07-20 DIAGNOSIS — G4733 Obstructive sleep apnea (adult) (pediatric): Secondary | ICD-10-CM | POA: Diagnosis not present

## 2018-07-20 DIAGNOSIS — E119 Type 2 diabetes mellitus without complications: Secondary | ICD-10-CM | POA: Diagnosis not present

## 2018-07-20 LAB — POCT GLYCOSYLATED HEMOGLOBIN (HGB A1C): HbA1c, POC (controlled diabetic range): 6.5 % (ref 0.0–7.0)

## 2018-07-20 NOTE — Assessment & Plan Note (Addendum)
Her A1c today is in a healthy range at 6.5.  Her diabetic foot exam was unremarkable.  Her labs drawn in September 2019, her GFR is estimated to be 103. -Continue metformin 500 mg twice daily (do not double up on metformin for a missed dose) -Follow-up with your ophthalmologist for your annual eye exam -Follow-up in 3 months's

## 2018-07-20 NOTE — Assessment & Plan Note (Addendum)
I recently received the following message regarding obtaining a CPAP for Ms. Vandegrift.  It appears that she will likely need to undergo a new sleep study in order for CPAP to be covered by her insurance.  I will verify this is necessary before scheduling an additional sleep study as she has had 2 sleep studies in the past 2 years confirming her diagnosis of obstructive sleep apnea.  "From: Elon Alas  Sent: 07/16/2018  4:51 PM EST  To: Darlina Guys  Subject: RE: DME - CPAP autotitration           I do not see any orders in Epic for the CPAP equipment. The only orders I see around the time you stated is an order from Dr. Annamaria Boots for the sleep studies. She has not been set up in our system, so I believe that we did not get the paperwork. Unfortunately, the patient has [facility], which follows Medicare guidelines that the patient is set up within 5 months of the sleep study. And since it has been more than 6 months, the patient would have to start the process over again and requalify the DX. This means, she would need a new office visit note that discusses the patients sleep issues and/or reason that she is being sent for a sleep study. And then another diagnostic baseline sleep study."

## 2018-07-20 NOTE — Progress Notes (Signed)
Subjective:  Morgan Collins is a 63 y.o. female who presents to the [facility] today for follow-up visit regarding her diabetes and difficulty obtaining CPAP for her OSA.  HPI: Diabetes She reports that she continues to take her metformin 500 mg twice daily.  She denies excessive thirst or excessive urination.  She endorses good sensation of her feet.  She was encouraged to follow-up with her ophthalmologist for annual diabetic eye exam.   Obstructive sleep apnea She continues to suffer from obstructive sleep apnea due to lack of CPAP.  She is told by family members that she continues to snore excessively and she is now changed her sleep habits to sleep upright to reduce snoring.  She also endorses daytime sleepiness.  She denies waking at night frequently and denies being told that she sometimes stops breathing at night.  She has 2 sleep studies in the past 2 years confirming a diagnosis of obstructive sleep apnea.  She also suffers from hypertension which is certainly exacerbated by her obstructive sleep apnea.  Chief Complaint noted Review of Symptoms - see HPI PMH - Smoking status noted.    Objective:  Physical Exam: BP 112/74   Pulse 84   Temp 98.2 F (36.8 C) (Oral)   Ht 5\' 1"  (1.549 m)   Wt 249 lb 12.8 oz (113.3 kg)   SpO2 99%   BMI 47.20 kg/m    Gen: NAD, resting comfortably CV: RRR with no murmurs appreciated Pulm: NWOB, CTAB with no crackles, wheezes, or rhonchi GI: Normal bowel sounds present. Soft, Nontender, Nondistended. Skin: warm, dry Neuro: grossly normal, moves all extremities Foot exam: Sensation to pinprick intact in all tested areas of her feet bilaterally.  No lesions noted.  Strong DP pulses.  Results for orders placed or performed in visit on 07/20/18 (from the past 72 hour(s))  POCT glycosylated hemoglobin (Hb A1C)     Status: None   Collection Time: 07/20/18  2:52 PM  Result Value Ref Range   Hemoglobin A1C     HbA1c POC (<> result, manual entry)       HbA1c, POC (prediabetic range)     HbA1c, POC (controlled diabetic range) 6.5 0.0 - 7.0 %     Assessment/Plan:  OSA (obstructive sleep apnea) I recently received the following message regarding obtaining a CPAP for Ms. Gantt.  It appears that she will likely need to undergo a new sleep study in order for CPAP to be covered by her insurance.  I will verify this is necessary before scheduling an additional sleep study as she has had 2 sleep studies in the past 2 years confirming her diagnosis of obstructive sleep apnea.  "From: Elon Alas  Sent: 07/16/2018  4:51 PM EST  To: Darlina Guys  Subject: RE: DME - CPAP autotitration           I do not see any orders in Epic for the CPAP equipment. The only orders I see around the time you stated is an order from Dr. Annamaria Boots for the sleep studies. She has not been set up in our system, so I believe that we did not get the paperwork. Unfortunately, the patient has [facility], which follows Medicare guidelines that the patient is set up within 5 months of the sleep study. And since it has been more than 6 months, the patient would have to start the process over again and requalify the DX. This means, she would need a new office visit note that  discusses the patients sleep issues and/or reason that she is being sent for a sleep study. And then another diagnostic baseline sleep study."  Type 2 diabetes mellitus, controlled ([place]) Her A1c today is in a healthy range at 6.5.  Her diabetic foot exam was unremarkable.  Her labs drawn in September 2019, her GFR is estimated to be 103. -Continue metformin 500 mg twice daily (do not double up on metformin for a missed dose) -Follow-up with your ophthalmologist for your annual eye exam -Follow-up in 3 months's

## 2018-07-20 NOTE — Patient Instructions (Signed)
It was good to see you today.  Here is a quick summary of what we talked about:  Diabetes -Continue taking your metformin 500 mg twice daily -Continue with diet and exercise -Follow-up with your ophthalmologist noted to get your diabetic eye exam  Obstructive sleep apnea -I will follow-up with you once or not the next step is

## 2018-09-03 ENCOUNTER — Other Ambulatory Visit: Payer: Self-pay | Admitting: Family Medicine

## 2018-09-03 DIAGNOSIS — F331 Major depressive disorder, recurrent, moderate: Secondary | ICD-10-CM

## 2018-09-03 DIAGNOSIS — R7303 Prediabetes: Secondary | ICD-10-CM

## 2018-09-03 DIAGNOSIS — G4733 Obstructive sleep apnea (adult) (pediatric): Secondary | ICD-10-CM

## 2018-09-03 DIAGNOSIS — I10 Essential (primary) hypertension: Secondary | ICD-10-CM

## 2018-09-03 MED ORDER — ATORVASTATIN CALCIUM 80 MG PO TABS: 80.0000 mg | ORAL_TABLET | Freq: Every day | ORAL | 1 refills | Status: DC

## 2018-09-03 MED ORDER — LEVOTHYROXINE SODIUM 100 MCG PO TABS: 100.0000 ug | ORAL_TABLET | Freq: Every day | ORAL | 0 refills | Status: DC

## 2018-09-03 MED ORDER — CITALOPRAM HYDROBROMIDE 20 MG PO TABS: 20.0000 mg | ORAL_TABLET | Freq: Every evening | ORAL | 1 refills | Status: DC

## 2018-09-03 MED ORDER — LOSARTAN POTASSIUM 100 MG PO TABS: 100.0000 mg | ORAL_TABLET | Freq: Every day | ORAL | 3 refills | Status: DC

## 2018-09-03 MED ORDER — METFORMIN HCL 500 MG PO TABS: 500.0000 mg | ORAL_TABLET | Freq: Two times a day (BID) | ORAL | 3 refills | Status: DC

## 2018-09-03 MED ORDER — AMLODIPINE BESYLATE 10 MG PO TABS: 10.0000 mg | ORAL_TABLET | Freq: Every day | ORAL | 0 refills | Status: DC

## 2018-09-03 MED ORDER — ASPIRIN EC 81 MG PO TBEC: 81.0000 mg | DELAYED_RELEASE_TABLET | Freq: Every day | ORAL | 3 refills | Status: DC

## 2018-09-03 NOTE — Progress Notes (Signed)
Referral to sleep medicine placed.

## 2018-10-26 ENCOUNTER — Telehealth: Payer: Self-pay | Admitting: *Deleted

## 2018-10-26 NOTE — Telephone Encounter (Signed)
LM for patient to call back.  Would like to offer her an appointment with Dr. Pilar Plate for her medicare wellness visit.  He has openings for these next Friday May 22 in the morning.  Will continue to try and reach patient. Kiril Hippe,CMA

## 2018-10-28 ENCOUNTER — Other Ambulatory Visit: Payer: Self-pay | Admitting: Family Medicine

## 2018-10-28 DIAGNOSIS — I10 Essential (primary) hypertension: Secondary | ICD-10-CM

## 2018-10-30 ENCOUNTER — Ambulatory Visit: Payer: Medicare PPO | Admitting: Family Medicine

## 2018-10-30 ENCOUNTER — Other Ambulatory Visit: Payer: Self-pay

## 2018-10-30 DIAGNOSIS — Z00129 Encounter for routine child health examination without abnormal findings: Secondary | ICD-10-CM

## 2018-10-30 NOTE — Progress Notes (Signed)
Pt called 3 times. No answer.  VM set up.  Left message informing her that I was contacting her regarding her annual wellness visit.  [name], MD

## 2018-11-19 ENCOUNTER — Institutional Professional Consult (permissible substitution): Payer: Medicare PPO | Admitting: Internal Medicine

## 2019-01-14 DIAGNOSIS — Z20828 Contact with and (suspected) exposure to other viral communicable diseases: Secondary | ICD-10-CM | POA: Diagnosis not present

## 2019-02-01 ENCOUNTER — Other Ambulatory Visit: Payer: Self-pay | Admitting: Family Medicine

## 2019-02-01 DIAGNOSIS — F331 Major depressive disorder, recurrent, moderate: Secondary | ICD-10-CM

## 2019-02-10 ENCOUNTER — Other Ambulatory Visit: Payer: Self-pay | Admitting: Family Medicine

## 2019-02-10 DIAGNOSIS — I10 Essential (primary) hypertension: Secondary | ICD-10-CM

## 2019-04-12 ENCOUNTER — Other Ambulatory Visit: Payer: Self-pay | Admitting: Family Medicine

## 2019-04-12 DIAGNOSIS — I10 Essential (primary) hypertension: Secondary | ICD-10-CM

## 2019-04-22 ENCOUNTER — Other Ambulatory Visit: Payer: Self-pay

## 2019-04-22 ENCOUNTER — Ambulatory Visit (INDEPENDENT_AMBULATORY_CARE_PROVIDER_SITE_OTHER): Payer: Medicare PPO | Admitting: Family Medicine

## 2019-04-22 ENCOUNTER — Encounter: Payer: Self-pay | Admitting: Family Medicine

## 2019-04-22 VITALS — BP 130/70 | HR 92 | Ht 62.0 in | Wt 256.1 lb

## 2019-04-22 DIAGNOSIS — E039 Hypothyroidism, unspecified: Secondary | ICD-10-CM | POA: Diagnosis not present

## 2019-04-22 DIAGNOSIS — E785 Hyperlipidemia, unspecified: Secondary | ICD-10-CM | POA: Diagnosis not present

## 2019-04-22 DIAGNOSIS — E119 Type 2 diabetes mellitus without complications: Secondary | ICD-10-CM | POA: Diagnosis not present

## 2019-04-22 DIAGNOSIS — G4733 Obstructive sleep apnea (adult) (pediatric): Secondary | ICD-10-CM | POA: Diagnosis not present

## 2019-04-22 DIAGNOSIS — I10 Essential (primary) hypertension: Secondary | ICD-10-CM

## 2019-04-22 DIAGNOSIS — F325 Major depressive disorder, single episode, in full remission: Secondary | ICD-10-CM | POA: Diagnosis not present

## 2019-04-22 DIAGNOSIS — Z23 Encounter for immunization: Secondary | ICD-10-CM | POA: Diagnosis not present

## 2019-04-22 DIAGNOSIS — Z6841 Body Mass Index (BMI) 40.0 and over, adult: Secondary | ICD-10-CM | POA: Diagnosis not present

## 2019-04-22 DIAGNOSIS — M810 Age-related osteoporosis without current pathological fracture: Secondary | ICD-10-CM | POA: Diagnosis not present

## 2019-04-22 LAB — POCT GLYCOSYLATED HEMOGLOBIN (HGB A1C): HbA1c, POC (controlled diabetic range): 7.2 % — AB (ref 0.0–7.0)

## 2019-04-22 NOTE — Assessment & Plan Note (Signed)
Blood pressure within goal range today.  No evidence of hypotension.  We will continue on current regimen. -Follow-up BMP today

## 2019-04-22 NOTE — Assessment & Plan Note (Signed)
Follow up TSH

## 2019-04-22 NOTE — Patient Instructions (Addendum)
Great to see you today!  Here's a quick summary of what we talked about:  - Start taking your metformin again - Take a peak at the nutrition information below - walk at least 30 minutes a day 5 days a week - I will follow up on your blood work - call the sleep medicine doctor to reschedule your appointment MyPlate from [place] is an outline of a general healthy diet based on the 2010 Dietary Guidelines for Americans, from the U.S. Department of Agriculture Scientist, research (physical sciences)). It sets guidelines for how much food you should eat from each food group based on your age, sex, and level of physical activity. What are tips for following MyPlate? To follow MyPlate recommendations:  Eat a wide variety of fruits and vegetables, grains, and protein foods.  Serve smaller portions and eat less food throughout the day.  Limit portion sizes to avoid overeating.  Enjoy your food.  Get at least 150 minutes of exercise every week. This is about 30 minutes each day, 5 or more days per week. It can be difficult to have every meal look like MyPlate. Think about MyPlate as eating guidelines for an entire day, rather than each individual meal. Fruits and vegetables  Make half of your plate fruits and vegetables.  Eat many different colors of fruits and vegetables each day.  For a 2,000 calorie daily food plan, eat: ? 2 cups of vegetables every day. ? 2 cups of fruit every day.  1 cup is equal to: ? 1 cup raw or cooked vegetables. ? 1 cup raw fruit. ? 1 medium-sized orange, apple, or banana. ? 1 cup 100% fruit or vegetable juice. ? 2 cups raw leafy greens, such as lettuce, spinach, or kale. ?  cup dried fruit. Grains  One fourth of your plate should be grains.  Make at least half of the grains you eat each day whole grains.  For a 2,000 calorie daily food plan, eat 6 oz of grains every day.  1 oz is equal to: ? 1 slice bread. ? 1 cup cereal. ?  cup cooked rice, cereal, or  pasta. Protein  One fourth of your plate should be protein.  Eat a wide variety of protein foods, including meat, poultry, fish, eggs, beans, nuts, and tofu.  For a 2,000 calorie daily food plan, eat 5 oz of protein every day.  1 oz is equal to: ? 1 oz meat, poultry, or fish. ?  cup cooked beans. ? 1 egg. ?  oz nuts or seeds. ? 1 Tbsp peanut butter. Dairy  Drink fat-free or low-fat (1%) milk.  Eat or drink dairy as a side to meals.  For a 2,000 calorie daily food plan, eat or drink 3 cups of dairy every day.  1 cup is equal to: ? 1 cup milk, yogurt, cottage cheese, or soy milk (soy beverage). ? 2 oz processed cheese. ? 1 oz natural cheese. Fats, oils, salt, and sugars  Only small amounts of oils are recommended.  Avoid foods that are high in calories and low in nutritional value (empty calories), like foods high in fat or added sugars.  Choose foods that are low in salt (sodium). Choose foods that have less than 140 milligrams (mg) of sodium per serving.  Drink water instead of sugary drinks. Drink enough water each day to keep your urine pale yellow. Where to find support  Work with your health care provider or a nutrition specialist (dietitian) to develop a customized eating  plan that is right for you.  Download an app (mobile application) to help you track your daily food intake. Where to find more information  Go to CashmereCloseouts.hu for more information.  Learn more and log your daily food intake according to MyPlate using USDA's SuperTracker: www.supertracker.usda.gov Summary  MyPlate is a general guideline for healthy eating from the USDA. It is based on the 2010 Dietary Guidelines for Americans.  In general, fruits and vegetables should take up  of your plate, grains should take up  of your plate, and protein should take up  of your plate. This information is not intended to replace advice given to you by your health care provider. Make sure you  discuss any questions you have with your health care provider. Document Released: 06/23/2007 Document Revised: 07/05/2017 Document Reviewed: 09/02/2016 Elsevier Patient Education  [place].

## 2019-04-22 NOTE — Assessment & Plan Note (Addendum)
She was told that the recalls from Metformin before the metformin XR that her Metformin safe to take.  She is encouraged to resume taking her metformin.  A1c is mildly increased today at 7.2 from 6.5 the last check.  She declined a foot exam and was informed earlier today that she has good circulation in her feet and normal sensation.  She was encouraged to wash examine her feet regularly. -Continue metformin 500 twice daily -Continue atorvastatin 80 mg daily -She was encouraged to follow-up with ophthalmology for diabetic eye exam -Follow-up BMP

## 2019-04-22 NOTE — Progress Notes (Signed)
    Subjective:  Morgan Collins is a 63 y.o. female who presents to the [facility] today with a chief complaint of routine clinic visit.   HPI:  Diabetes Morgan Collins does enjoy home-cooked meals although she has difficulty appreciating the importance of monitoring her carbohydrate intake.  She reports that her dinner last night consisted of beef stew with gravy mashed potatoes with corn, ginger ale.  She does walk occasionally but not every day.  She does not have any set exercise regimen.  She stopped metformin 2 weeks ago because she heard bad things on the news.  She reports that she had a home health checkup today performed by a [employer] associate.  At her home checkup today a foot exam was performed and she was told that she had good circulation in her feet.  She also reports that she has very good sensation and washes and monitors her feet regularly.  HTN Home medication consist only of losartan daily.  She denies any issues with lightheadedness or dizziness upon standing.  TSH She wonders if her thyroid hormone is low because sometimes she feels groggy when she wakes up in the morning.  She would like to have her thyroid checked.  She currently takes Synthroid 100 mcg daily.  Obstructive sleep apnea She has had much difficulty in obtaining a CPAP for her diagnosed obstructive sleep apnea.  The next step in the process is to have her schedule an appointment with sleep medicine.  She apparently no showed to her last appointment with sleep medicine.  Non smoker  Chief Complaint noted Review of Symptoms - see HPI PMH - Smoking status noted.    Objective:  Physical Exam: BP 130/70   Pulse 92   Ht 5\' 2"  (1.575 m)   Wt 256 lb 2 oz (116.2 kg)   SpO2 96%   BMI 46.85 kg/m    Gen: NAD, resting comfortably CV: RRR with no murmurs appreciated Pulm: NWOB, CTAB with no crackles, wheezes, or rhonchi GI: Normal bowel sounds present. Soft, Nontender, Nondistended. MSK: no edema, cyanosis, or  clubbing noted Skin: warm, dry Neuro: grossly normal, moves all extremities Psych: Normal affect and thought content  Results for orders placed or performed in visit on 04/22/19 (from the past 72 hour(s))  HgB A1c     Status: Abnormal   Collection Time: 04/22/19  2:55 PM  Result Value Ref Range   Hemoglobin A1C     HbA1c POC (<> result, manual entry)     HbA1c, POC (prediabetic range)     HbA1c, POC (controlled diabetic range) 7.2 (A) 0.0 - 7.0 %     Assessment/Plan:  Type 2 diabetes mellitus, controlled ([place]) She was told that the recalls from Metformin before the metformin XR that her Metformin safe to take.  She is encouraged to resume taking her metformin.  A1c is mildly increased today at 7.2 from 6.5 the last check.  She declined a foot exam and was informed earlier today that she has good circulation in her feet and normal sensation.  She was encouraged to wash examine her feet regularly. -Continue metformin 500 twice daily -Continue atorvastatin 80 mg daily -She was encouraged to follow-up with ophthalmology for diabetic eye exam -Follow-up BMP  Essential hypertension Blood pressure within goal range today.  No evidence of hypotension.  We will continue on current regimen. -Follow-up BMP today  Hypothyroidism -Follow-up TSH

## 2019-04-23 ENCOUNTER — Other Ambulatory Visit: Payer: Self-pay | Admitting: Family Medicine

## 2019-04-23 ENCOUNTER — Telehealth: Payer: Self-pay | Admitting: Family Medicine

## 2019-04-23 LAB — BASIC METABOLIC PANEL
BUN/Creatinine Ratio: 16 (ref 12–28)
BUN: 12 mg/dL (ref 8–27)
CO2: 26 mmol/L (ref 20–29)
Calcium: 9.2 mg/dL (ref 8.7–10.3)
Chloride: 103 mmol/L (ref 96–106)
Creatinine, Ser: 0.76 mg/dL (ref 0.57–1.00)
GFR calc Af Amer: 97 mL/min/{1.73_m2} (ref >59)
GFR calc non Af Amer: 84 mL/min/{1.73_m2} (ref >59)
Glucose: 95 mg/dL (ref 65–99)
Potassium: 4.1 mmol/L (ref 3.5–5.2)
Sodium: 140 mmol/L (ref 134–144)

## 2019-04-23 LAB — LIPID PANEL
Chol/HDL Ratio: 3.4 ratio (ref 0.0–4.4)
Cholesterol, Total: 181 mg/dL (ref 100–199)
HDL: 53 mg/dL (ref >39)
LDL Chol Calc (NIH): 108 mg/dL — ABNORMAL HIGH (ref 0–99)
Triglycerides: 110 mg/dL (ref 0–149)
VLDL Cholesterol Cal: 20 mg/dL (ref 5–40)

## 2019-04-23 LAB — TSH: TSH: 5.13 u[IU]/mL — ABNORMAL HIGH (ref 0.450–4.500)

## 2019-04-23 MED ORDER — LEVOTHYROXINE SODIUM 112 MCG PO TABS: 112.0000 ug | ORAL_TABLET | ORAL | 3 refills | Status: DC

## 2019-04-23 NOTE — Telephone Encounter (Signed)
Entered in error

## 2019-05-07 ENCOUNTER — Other Ambulatory Visit: Payer: Self-pay | Admitting: Family Medicine

## 2019-05-07 DIAGNOSIS — F331 Major depressive disorder, recurrent, moderate: Secondary | ICD-10-CM

## 2019-05-10 ENCOUNTER — Encounter: Payer: Self-pay | Admitting: Family Medicine

## 2019-05-10 NOTE — Progress Notes (Signed)
Ankle Brachial Index  04/22/2019  Performed by [facility] staff.  Right foot: 0.92 (mild disease)  Left foot 1.24 (normal)

## 2019-06-18 ENCOUNTER — Other Ambulatory Visit: Payer: Self-pay | Admitting: Family Medicine

## 2019-06-18 DIAGNOSIS — I10 Essential (primary) hypertension: Secondary | ICD-10-CM

## 2019-06-28 DIAGNOSIS — Z20822 Contact with and (suspected) exposure to covid-19: Secondary | ICD-10-CM | POA: Diagnosis not present

## 2019-07-07 DIAGNOSIS — Z20828 Contact with and (suspected) exposure to other viral communicable diseases: Secondary | ICD-10-CM | POA: Diagnosis not present

## 2019-08-26 ENCOUNTER — Ambulatory Visit: Payer: Medicare HMO | Attending: Internal Medicine

## 2019-08-26 DIAGNOSIS — Z23 Encounter for immunization: Secondary | ICD-10-CM

## 2019-08-26 NOTE — Progress Notes (Signed)
   Covid-19 Vaccination Clinic  Name:  Morgan Collins    MRN: [id number] DOB: Feb 08, 1956  08/26/2019  [name] was observed post Covid-19 immunization for 15 minutes without incident. She was provided with Vaccine Information Sheet and instruction to access the V-Safe system.   [name] was instructed to call 911 with any severe reactions post vaccine: Marland Kitchen Difficulty breathing  . Swelling of face and throat  . A fast heartbeat  . A bad rash all over body  . Dizziness and weakness   Immunizations Administered    Name Date Dose VIS Date Route   Pfizer COVID-19 Vaccine 08/26/2019  1:08 PM 0.3 mL 05/28/2019 Intramuscular   Manufacturer: [place]   Lot: [id number]   [place]: [id number]

## 2019-09-14 ENCOUNTER — Other Ambulatory Visit: Payer: Self-pay

## 2019-09-14 ENCOUNTER — Telehealth (INDEPENDENT_AMBULATORY_CARE_PROVIDER_SITE_OTHER): Payer: Medicare HMO | Admitting: Family Medicine

## 2019-09-14 ENCOUNTER — Telehealth: Payer: Self-pay

## 2019-09-14 DIAGNOSIS — M7989 Other specified soft tissue disorders: Secondary | ICD-10-CM | POA: Diagnosis not present

## 2019-09-14 DIAGNOSIS — I10 Essential (primary) hypertension: Secondary | ICD-10-CM

## 2019-09-14 DIAGNOSIS — E119 Type 2 diabetes mellitus without complications: Secondary | ICD-10-CM

## 2019-09-14 NOTE — Progress Notes (Signed)
    SUBJECTIVE:   CHIEF COMPLAINT / HPI:   On the screening survey when Ms. Bobko came to the clinic, she noted that she has had a cough for several days.  The scheduled visit was canceled and instead a phone visit was conducted as Ms. Ohayon has a phone returning from its typical telephone calls.  Leg swelling Ms. Stobbs has noticed some bilateral leg swelling for roughly the past 2 weeks.  She notices occasional pain in her right ankle walking up stairs.  The swelling is most noticeable close to her ankles and she sometimes removes her feet from her crocs to find that there is an impression of the shoes around her ankles.  She wants to know if the swelling is anything concerning or if it is something she should take water pills for her.  She believes she is previously been prescribed water pills.  She is not having any significant pain in her thighs.  The swelling seems to be equal on both sides, she denies chest pain and shortness of breath.   PERTINENT  PMH / PSH: Hypertension treated with amlodipine.  OBJECTIVE:   There were no vitals taken for this visit.   Respiratory: Breathing comfortably on room air during our telephone call.  It was complete long sentences without effort.  ASSESSMENT/PLAN:   Bilateral leg swelling Based on our conversation, this leg swelling is likely of benign etiology.  Wells score for DVT is 0.  Likely benign etiology.  Cannot draw labs due to this being a telephone encounter.  She was encouraged to begin using compression socks at home which will likely be helpful.  She was encouraged to return to clinic once her coughing and URI symptoms resolved if she would like further evaluation.   [name], MD [place]

## 2019-09-14 NOTE — Telephone Encounter (Signed)
Patient calls nurse line regarding possible arthritis pain. Patient reports aching pain in knees and pain in joints of hands. Patient calling for recommendations from provider regarding OTC/ home treatments, as she is going out of town soon.   To PCP  Please advise Talbot Grumbling, RN

## 2019-09-15 NOTE — Telephone Encounter (Signed)
LM for patient ok per DPR. Abbey Veith,CMA  

## 2019-09-15 NOTE — Telephone Encounter (Signed)
Please call Morgan Collins and let her know that for mild pain, she can use OTC tylenol or OTC voltaren gel (it is safe for her to use). If she has significant pain or would like to be evaluated, she should schedule a telehealth visit or in person visit if her cough has resolved.  [name], MD

## 2019-09-15 NOTE — Telephone Encounter (Signed)
Patient called back and was informed of message from Dr. Pilar Plate. Mahalia Dykes,CMA

## 2019-09-20 ENCOUNTER — Ambulatory Visit: Payer: Medicare HMO | Attending: Internal Medicine

## 2019-09-20 DIAGNOSIS — Z23 Encounter for immunization: Secondary | ICD-10-CM

## 2019-09-20 NOTE — Progress Notes (Signed)
   Covid-19 Vaccination Clinic  Name:  CARNITA STOCKSTILL    MRN: [id number] DOB: July 06, 1955  09/20/2019  Ms. Vonwald was observed post Covid-19 immunization for 15 minutes without incident. She was provided with Vaccine Information Sheet and instruction to access the V-Safe system.   Ms. Benish was instructed to call 911 with any severe reactions post vaccine: Marland Kitchen Difficulty breathing  . Swelling of face and throat  . A fast heartbeat  . A bad rash all over body  . Dizziness and weakness   Immunizations Administered    Name Date Dose VIS Date Route   Pfizer COVID-19 Vaccine 09/20/2019 10:31 AM 0.3 mL 05/28/2019 Intramuscular   Manufacturer: [place]   Lot: [id number]   [place]: [id number]

## 2019-09-30 ENCOUNTER — Ambulatory Visit: Payer: Medicare HMO | Admitting: Endocrinology

## 2019-11-01 ENCOUNTER — Other Ambulatory Visit: Payer: Medicare HMO

## 2019-11-03 ENCOUNTER — Ambulatory Visit: Payer: Medicare HMO | Admitting: Endocrinology

## 2019-12-03 ENCOUNTER — Other Ambulatory Visit: Payer: Self-pay

## 2019-12-03 ENCOUNTER — Other Ambulatory Visit: Payer: Self-pay | Admitting: Family Medicine

## 2019-12-03 MED ORDER — LOSARTAN POTASSIUM 100 MG PO TABS: 100.0000 mg | ORAL_TABLET | Freq: Every day | ORAL | 3 refills | Status: DC

## 2019-12-28 ENCOUNTER — Encounter: Payer: Self-pay | Admitting: Family Medicine

## 2019-12-28 ENCOUNTER — Other Ambulatory Visit: Payer: Self-pay

## 2019-12-28 ENCOUNTER — Ambulatory Visit (INDEPENDENT_AMBULATORY_CARE_PROVIDER_SITE_OTHER): Payer: Medicare HMO | Admitting: Family Medicine

## 2019-12-28 VITALS — BP 114/70 | HR 86 | Ht 62.0 in | Wt 246.0 lb

## 2019-12-28 DIAGNOSIS — M7989 Other specified soft tissue disorders: Secondary | ICD-10-CM

## 2019-12-28 DIAGNOSIS — R32 Unspecified urinary incontinence: Secondary | ICD-10-CM

## 2019-12-28 DIAGNOSIS — D619 Aplastic anemia, unspecified: Secondary | ICD-10-CM

## 2019-12-28 DIAGNOSIS — M810 Age-related osteoporosis without current pathological fracture: Secondary | ICD-10-CM

## 2019-12-28 DIAGNOSIS — E119 Type 2 diabetes mellitus without complications: Secondary | ICD-10-CM

## 2019-12-28 DIAGNOSIS — E039 Hypothyroidism, unspecified: Secondary | ICD-10-CM

## 2019-12-28 DIAGNOSIS — G4733 Obstructive sleep apnea (adult) (pediatric): Secondary | ICD-10-CM | POA: Diagnosis not present

## 2019-12-28 DIAGNOSIS — F331 Major depressive disorder, recurrent, moderate: Secondary | ICD-10-CM

## 2019-12-28 DIAGNOSIS — D649 Anemia, unspecified: Secondary | ICD-10-CM

## 2019-12-28 DIAGNOSIS — I10 Essential (primary) hypertension: Secondary | ICD-10-CM

## 2019-12-28 MED ORDER — MIRABEGRON ER 25 MG PO TB24: 25.0000 mg | ORAL_TABLET | Freq: Every day | ORAL | 1 refills | Status: DC

## 2019-12-28 NOTE — Assessment & Plan Note (Signed)
She was encouraged to discuss alendronate with her endocrinologist who prescribed it.  She is informed that she would likely benefit from continuing this medication.

## 2019-12-28 NOTE — Assessment & Plan Note (Signed)
1+ ankle swelling present in lower extremities bilaterally.  Most likely due to venous sufficiency.  Very low suspicion for CHF, hypothyroidism, low oncotic pressure DVT very unlikely due to bilateral presentation.  We will follow-up basic labs today and I recommended compression socks. -Follow-up TSH, CBC, BMP

## 2019-12-28 NOTE — Assessment & Plan Note (Signed)
Stable. -Continue amlodipine and losartan -Follow-up BMP

## 2019-12-28 NOTE — Assessment & Plan Note (Signed)
Most likely urge incontinence based on symptoms although history shows that there may be an element of neurogenic bladder involved.  We will try a trial of Myrbetriq 25 mg.  If this is prohibitively expensive or does not work, we will have a longer discussion prior to starting other medications. -Myrbetriq 25 mg daily -Follow-up in 1 month

## 2019-12-28 NOTE — Assessment & Plan Note (Signed)
Currently taking Synthroid 1 112 mcg daily. -Follow-up TSH

## 2019-12-28 NOTE — Patient Instructions (Signed)
It was so nice to see you today Ms. Zhong.  Physical examination things we talked about:  Urge incontinence: It sounds like part of the reason you have trouble with urination is from bladder spasms.  I think we should start a medication called Myrbetriq and see if you get good relief from that.  Please let me know if this medication is too expensive.  It is a new medication so we can consider different options if this is too expensive.  Hypertension: Continue current medications  Alendronate: This can be a very helpful medication.  Think that you probably would benefit from staying on it but encourage you to discuss this with the provider who gave it to you.  Breast cancer screening: I have ordered your diagnostic mammogram.  Please call the breast center to schedule an appointment.  Leg swelling: I will let you know if there are any abnormalities in your lab work.  Have a low suspicion that this is anything dangerous.  I recommend buying compression socks.  He can buy these on [place] and I recommend buying socks with at least 15 mmHg of pressure.  I will let you know if there are any abnormalities in your lab work.

## 2019-12-28 NOTE — Assessment & Plan Note (Signed)
Follow-up lipid panel 

## 2019-12-28 NOTE — Assessment & Plan Note (Signed)
Stable on Celexa 40 mg daily. -Consider dose reduction to 20 mg at future visits due to age over 62

## 2019-12-28 NOTE — Progress Notes (Signed)
SUBJECTIVE:   CHIEF COMPLAINT / HPI:   Hypertension She has been taking amlodipine 10 mg daily and losartan 100 mg daily.  No lightheadedness or dizziness.  Diabetes She admits that she only intermittently remembers to take her Metformin 500 mg daily.  She has been making an effort to work on her diet and is especially mindful of having a low salt diet.  She noted that she has not been to the ophthalmologist for diabetic eye exam recently and has been avoiding it.  Osteoporosis She was started on alendronate by Dr. Dwyane Dee with endocrinology.  She has run out of the medication and has not taken it the past month or so.  She did not understand why she was taking this medication.  HM She is previously gotten mammograms regularly although has not gotten any in the past 2 years due to Covid and difficulty scheduling.  She is interested in having a mammogram done.  Overactive bladder Ms. Meharg reports that she has been wearing diapers since her stroke in 2015 due to inability to get to the bathroom in time.  She reports that her primary issue is that she has a sudden urge to go to the bathroom and is not able to make it to the bathroom before urinating.  This is primarily a problem during the day.  She also reports that she has mild leakage during coughing, sneezing, laughing but that is a more minor issue.  She does frequently wake at night to use the bathroom and sometimes wakes to find that she has urinated.  Depression We did not discuss this in detail today although she reported that she had worsened subjective symptoms of depression earlier in the year and increased her dose to Celexa 40 mg daily.  She has been on this dose for several months now and reports that she does feel significantly improved.   PERTINENT  PMH / PSH: CVA 2015, DM, HTN, obesity  OBJECTIVE:   BP 114/70   Pulse 86   Ht 5\' 2"  (1.575 m)   Wt 246 lb (111.6 kg)   SpO2 98%   BMI 44.99 kg/m    General: Alert  and cooperative and appears to be in no acute distress Cardio: Normal S1 and S2, no S3 or S4. Rhythm is regular. No murmurs or rubs.   Pulm: Clear to auscultation bilaterally, no crackles, wheezing, or diminished breath sounds. Normal respiratory effort Abdomen: Bowel sounds normal. Abdomen soft and non-tender.  Extremities: No peripheral edema. Warm/ well perfused.  Strong radial and pedal pulses.  Foot exam performed today and documented.   ASSESSMENT/PLAN:   Essential hypertension Stable. -Continue amlodipine and losartan -Follow-up BMP  Leg swelling 1+ ankle swelling present in lower extremities bilaterally.  Most likely due to venous sufficiency.  Very low suspicion for CHF, hypothyroidism, low oncotic pressure DVT very unlikely due to bilateral presentation.  We will follow-up basic labs today and I recommended compression socks. -Follow-up TSH, CBC, BMP  Depression Stable on Celexa 40 mg daily. -Consider dose reduction to 20 mg at future visits due to age over 51   HLD (hyperlipidemia) -Follow-up lipid panel  Osteoporosis She was encouraged to discuss alendronate with her endocrinologist who prescribed it.  She is informed that she would likely benefit from continuing this medication.  Hypothyroidism Currently taking Synthroid 1 112 mcg daily. -Follow-up TSH  Urinary incontinence Most likely urge incontinence based on symptoms although history shows that there may be an element of neurogenic bladder  involved.  We will try a trial of Myrbetriq 25 mg.  If this is prohibitively expensive or does not work, we will have a longer discussion prior to starting other medications. -Myrbetriq 25 mg daily -Follow-up in 1 month     [name], MD [place]

## 2019-12-29 ENCOUNTER — Other Ambulatory Visit: Payer: Self-pay | Admitting: Family Medicine

## 2019-12-29 ENCOUNTER — Encounter: Payer: Self-pay | Admitting: Family Medicine

## 2019-12-29 LAB — LIPID PANEL
Chol/HDL Ratio: 3.7 ratio (ref 0.0–4.4)
Cholesterol, Total: 167 mg/dL (ref 100–199)
HDL: 45 mg/dL (ref >39)
LDL Chol Calc (NIH): 108 mg/dL — ABNORMAL HIGH (ref 0–99)
Triglycerides: 74 mg/dL (ref 0–149)
VLDL Cholesterol Cal: 14 mg/dL (ref 5–40)

## 2019-12-29 LAB — CBC
Hematocrit: 32.3 % — ABNORMAL LOW (ref 34.0–46.6)
Hemoglobin: 9.4 g/dL — ABNORMAL LOW (ref 11.1–15.9)
MCH: 21.1 pg — ABNORMAL LOW (ref 26.6–33.0)
MCHC: 29.1 g/dL — ABNORMAL LOW (ref 31.5–35.7)
MCV: 72 fL — ABNORMAL LOW (ref 79–97)
Platelets: 228 10*3/uL (ref 150–450)
RBC: 4.46 x10E6/uL (ref 3.77–5.28)
RDW: 18.1 % — ABNORMAL HIGH (ref 11.7–15.4)
WBC: 9.8 10*3/uL (ref 3.4–10.8)

## 2019-12-29 LAB — BASIC METABOLIC PANEL
BUN/Creatinine Ratio: 23 (ref 12–28)
BUN: 16 mg/dL (ref 8–27)
CO2: 24 mmol/L (ref 20–29)
Calcium: 9.2 mg/dL (ref 8.7–10.3)
Chloride: 104 mmol/L (ref 96–106)
Creatinine, Ser: 0.7 mg/dL (ref 0.57–1.00)
GFR calc Af Amer: 106 mL/min/{1.73_m2} (ref >59)
GFR calc non Af Amer: 92 mL/min/{1.73_m2} (ref >59)
Glucose: 77 mg/dL (ref 65–99)
Potassium: 4.3 mmol/L (ref 3.5–5.2)
Sodium: 142 mmol/L (ref 134–144)

## 2019-12-29 LAB — HEMOGLOBIN A1C
Est. average glucose Bld gHb Est-mCnc: 154 mg/dL
Hgb A1c MFr Bld: 7 % — ABNORMAL HIGH (ref 4.8–5.6)

## 2019-12-29 MED ORDER — FERROUS SULFATE 324 (65 FE) MG PO TBEC: 1.0000 | DELAYED_RELEASE_TABLET | Freq: Every day | ORAL | 2 refills | Status: DC

## 2019-12-29 NOTE — Addendum Note (Signed)
Addended by: Grant Ruts on: 12/29/2019 03:06 PM   Modules accepted: Orders

## 2019-12-31 LAB — SPECIMEN STATUS REPORT

## 2019-12-31 LAB — FERRITIN: Ferritin: 16 ng/mL (ref 15–150)

## 2020-02-23 ENCOUNTER — Ambulatory Visit (INDEPENDENT_AMBULATORY_CARE_PROVIDER_SITE_OTHER): Payer: Medicare HMO | Admitting: Family Medicine

## 2020-02-23 ENCOUNTER — Other Ambulatory Visit: Payer: Self-pay

## 2020-02-23 ENCOUNTER — Encounter: Payer: Self-pay | Admitting: Family Medicine

## 2020-02-23 VITALS — BP 110/80 | HR 83 | Ht 62.0 in | Wt 248.5 lb

## 2020-02-23 DIAGNOSIS — D509 Iron deficiency anemia, unspecified: Secondary | ICD-10-CM | POA: Insufficient documentation

## 2020-02-23 DIAGNOSIS — Z1231 Encounter for screening mammogram for malignant neoplasm of breast: Secondary | ICD-10-CM

## 2020-02-23 DIAGNOSIS — D508 Other iron deficiency anemias: Secondary | ICD-10-CM

## 2020-02-23 DIAGNOSIS — G4733 Obstructive sleep apnea (adult) (pediatric): Secondary | ICD-10-CM

## 2020-02-23 DIAGNOSIS — I10 Essential (primary) hypertension: Secondary | ICD-10-CM | POA: Diagnosis not present

## 2020-02-23 DIAGNOSIS — F331 Major depressive disorder, recurrent, moderate: Secondary | ICD-10-CM

## 2020-02-23 DIAGNOSIS — Z23 Encounter for immunization: Secondary | ICD-10-CM

## 2020-02-23 MED ORDER — LOSARTAN POTASSIUM 100 MG PO TABS: 100.0000 mg | ORAL_TABLET | Freq: Every day | ORAL | 3 refills | Status: DC

## 2020-02-23 MED ORDER — ASPIRIN EC 81 MG PO TBEC
81.0000 mg | DELAYED_RELEASE_TABLET | Freq: Every day | ORAL | 3 refills | Status: DC
Start: 2020-02-23 — End: 2021-12-12

## 2020-02-23 MED ORDER — CITALOPRAM HYDROBROMIDE 40 MG PO TABS: 20.0000 mg | ORAL_TABLET | Freq: Every evening | ORAL | 2 refills | Status: DC

## 2020-02-23 MED ORDER — AMLODIPINE BESYLATE 10 MG PO TABS: 10.0000 mg | ORAL_TABLET | Freq: Every day | ORAL | 3 refills | Status: DC

## 2020-02-23 NOTE — Assessment & Plan Note (Signed)
Still does not have a CPAP. -She will contact her insurance company and see if any further steps are necessary before ordering a CPAP.

## 2020-02-23 NOTE — Assessment & Plan Note (Signed)
She denies any abnormal uterine bleeding.  Recent colonoscopy shows no abnormalities. -Continue p.o. iron

## 2020-02-23 NOTE — Assessment & Plan Note (Signed)
PHQ-9 today indicating mild symptoms.  No SI/HI. -Increased citalopram to 40 mg daily -Encouraged to establish care with a therapist

## 2020-02-23 NOTE — Assessment & Plan Note (Signed)
Stable. -Refilled amlodipine -Refilled losartan

## 2020-02-23 NOTE — Patient Instructions (Signed)
Depression: We have increased your citalopram to 40 mg today.  I would still recommend establishing care with a therapist to really get the full benefit.  Ultimately, I will leave that up to you.  Obstructive sleep apnea: I am sorry that we still have not obtained a CPAP for you.  Please let me know what your insurance requires him to reach out to them.  Hypertension: Your medication has been refilled.  Overactive bladder: It sounds like your symptoms have shown some improvement.  We will continue the Myrbetriq for now.  Breast cancer screening: Call the [facility] breast center for an appointment for screening mammogram.  Follow-up with me in 3 months for diabetes follow-up.

## 2020-02-23 NOTE — Progress Notes (Signed)
    SUBJECTIVE:   CHIEF COMPLAINT / HPI:   Depression Ms. Hickling reports that she has been having some emotional difficulties lately because of a new diagnosis of cancer in the family.  She reports specifically that has been having a lot of trouble falling asleep and staying asleep at night despite taking her citalopram daily.  She wanted to know if she could increase the dose of her citalopram.  She has not established care with a therapist.  Hypertension Her current antihypertensive medication includes amlodipine and losartan.  Overactive bladder At her last visit, she started on Myrbetriq 25 mg daily.  She has been taking this daily and noticed some mild improvement in her symptoms of urinary urgency.  Iron deficiency anemia She has not yet started taking the iron from her previously diagnosed iron deficiency anemia.  PERTINENT  PMH / PSH: History of stroke, hypertension, hypothyroidism, depression  OBJECTIVE:   BP 110/80   Pulse 83   Ht 5\' 2"  (1.575 m)   Wt 248 lb 8 oz (112.7 kg)   SpO2 96%   BMI 45.45 kg/m    General: Alert and cooperative and appears to be in no acute distress Cardio: Normal S1 and S2, no S3 or S4. Rhythm is regular. No murmurs or rubs.   Pulm: Clear to auscultation bilaterally, no crackles, wheezing, or diminished breath sounds. Normal respiratory effort Abdomen: Bowel sounds normal. Abdomen soft and non-tender.  Extremities: No peripheral edema. Warm/ well perfused.  Strong radial pulse. Neuro: Cranial nerves grossly intact   ASSESSMENT/PLAN:   Essential hypertension Stable. -Refilled amlodipine -Refilled losartan  OSA (obstructive sleep apnea) Still does not have a CPAP. -She will contact her insurance company and see if any further steps are necessary before ordering a CPAP.  Depression PHQ-9 today indicating mild symptoms.  No SI/HI. -Increased citalopram to 40 mg daily -Encouraged to establish care with a therapist  Iron deficiency  anemia She denies any abnormal uterine bleeding.  Recent colonoscopy shows no abnormalities. -Continue p.o. iron     [name], MD [place]

## 2020-03-02 ENCOUNTER — Ambulatory Visit
Admission: RE | Admit: 2020-03-02 | Discharge: 2020-03-02 | Disposition: A | Discharge status: Home / Self Care | Payer: Medicare HMO | Source: Ambulatory Visit | Attending: Family Medicine | Admitting: Family Medicine

## 2020-03-02 ENCOUNTER — Other Ambulatory Visit: Payer: Self-pay

## 2020-03-02 DIAGNOSIS — Z1231 Encounter for screening mammogram for malignant neoplasm of breast: Secondary | ICD-10-CM

## 2020-08-08 ENCOUNTER — Ambulatory Visit: Payer: Medicare HMO | Admitting: Family Medicine

## 2020-09-08 ENCOUNTER — Encounter: Payer: Self-pay | Admitting: Family Medicine

## 2020-09-08 ENCOUNTER — Ambulatory Visit (INDEPENDENT_AMBULATORY_CARE_PROVIDER_SITE_OTHER): Payer: Medicare HMO

## 2020-09-08 ENCOUNTER — Ambulatory Visit (INDEPENDENT_AMBULATORY_CARE_PROVIDER_SITE_OTHER): Payer: Medicare HMO | Admitting: Family Medicine

## 2020-09-08 ENCOUNTER — Other Ambulatory Visit: Payer: Self-pay

## 2020-09-08 VITALS — BP 134/60 | HR 81 | Ht 62.0 in | Wt 262.4 lb

## 2020-09-08 DIAGNOSIS — I10 Essential (primary) hypertension: Secondary | ICD-10-CM

## 2020-09-08 DIAGNOSIS — E119 Type 2 diabetes mellitus without complications: Secondary | ICD-10-CM

## 2020-09-08 DIAGNOSIS — F331 Major depressive disorder, recurrent, moderate: Secondary | ICD-10-CM

## 2020-09-08 DIAGNOSIS — R7303 Prediabetes: Secondary | ICD-10-CM

## 2020-09-08 DIAGNOSIS — D508 Other iron deficiency anemias: Secondary | ICD-10-CM

## 2020-09-08 DIAGNOSIS — Z23 Encounter for immunization: Secondary | ICD-10-CM | POA: Diagnosis not present

## 2020-09-08 DIAGNOSIS — E039 Hypothyroidism, unspecified: Secondary | ICD-10-CM

## 2020-09-08 DIAGNOSIS — R32 Unspecified urinary incontinence: Secondary | ICD-10-CM

## 2020-09-08 LAB — POCT GLYCOSYLATED HEMOGLOBIN (HGB A1C): HbA1c, POC (controlled diabetic range): 7 % (ref 0.0–7.0)

## 2020-09-08 MED ORDER — AMLODIPINE BESYLATE 10 MG PO TABS: 10.0000 mg | ORAL_TABLET | Freq: Every day | ORAL | 3 refills | Status: DC

## 2020-09-08 MED ORDER — LOSARTAN POTASSIUM 100 MG PO TABS: 100.0000 mg | ORAL_TABLET | Freq: Every day | ORAL | 3 refills | Status: DC

## 2020-09-08 MED ORDER — METFORMIN HCL 500 MG PO TABS: 500.0000 mg | ORAL_TABLET | Freq: Two times a day (BID) | ORAL | 3 refills | Status: DC

## 2020-09-08 MED ORDER — ATORVASTATIN CALCIUM 80 MG PO TABS
80.0000 mg | ORAL_TABLET | Freq: Every day | ORAL | 1 refills | Status: DC
Start: 2020-09-08 — End: 2021-01-31

## 2020-09-08 MED ORDER — CITALOPRAM HYDROBROMIDE 40 MG PO TABS: 20.0000 mg | ORAL_TABLET | Freq: Every evening | ORAL | 2 refills | Status: DC

## 2020-09-08 MED ORDER — LEVOTHYROXINE SODIUM 112 MCG PO TABS
112.0000 ug | ORAL_TABLET | ORAL | 3 refills | Status: DC
Start: 2020-09-08 — End: 2021-03-07

## 2020-09-08 NOTE — Assessment & Plan Note (Signed)
-  Continue Synthroid 112 mcg -Follow-up TSH

## 2020-09-08 NOTE — Patient Instructions (Signed)
It was great to see you today.  Here is a quick review of the things we talked about:   Diabetes: Your A1c was 7 today.  This means you are in goal.  Continue your Metformin 500 mg twice daily.  Blood pressure: Regular recheck your blood pressure today.  I have refilled your blood pressure medications.  Anemia: It looks like you have had some low blood levels in the past.  We can recheck those today.  I will let you know if we need to do give you any additional iron.  Low bone density: At this point, I recommend that you simply look for an over-the-counter vitamin with calcium and vitamin D.  The recommendation is 800 international units of vitamin D and 1200 mg of calcium daily.  I would recommend trying to find a multivitamin with these components.  Cramping: Not positive what is causing her abdominal cramping.  I think at this point, it does not yet deserve a big work-up.  Lets continue to monitor for now.   If all of your labs are normal, I will send you a message over my chart or send you a letter.  If there is anything to discuss, I will give you a phone call.

## 2020-09-08 NOTE — Assessment & Plan Note (Signed)
She is not interested in moving forward with another trial of Myrbetriq 50 mg at this time.  We did not discuss oxybutynin and I would prefer to avoid medications with a high risk of adverse effects especially in the setting of polypharmacy.

## 2020-09-08 NOTE — Assessment & Plan Note (Addendum)
Last colonoscopy performed 06/2018 demonstrated no obvious reason for her current bleed.  She is due for a follow-up colonoscopy in 2023.  At her last visit, she denied any obvious signs of bleeding. -Follow-up CBC and ferritin from today

## 2020-09-08 NOTE — Assessment & Plan Note (Signed)
A1c 7.0 today.  Well-controlled. -Continue Metformin -Follow-up in 3 months

## 2020-09-08 NOTE — Assessment & Plan Note (Addendum)
Moderately well-controlled today.  I would prefer if her blood pressure were below 130/90 due to her history of stroke.  Her blood pressure is mildly elevated today and was found to be 134/60 on repeat.  Chart review shows that this is higher than its been in the past.  I would like to continue to monitor for now but consider starting an additional agent if she remains high at her next checkup.

## 2020-09-08 NOTE — Progress Notes (Signed)
SUBJECTIVE:   CHIEF COMPLAINT / HPI:   Cramping In addition to her chronic problems, she wanted to discuss occasional cramping of her lower extremities.  She notices this most when she gets out of bed and finds some cramping in the back of her legs.  It sometimes takes her several minutes to get over these cramping episodes and carry on with her day.  She wants to know if there is anything more she can do for these muscular cramps.  Right lower quadrant discomfort Additionally, she notes some right lower quadrant discomfort that happens once or twice per month.  Most recently, this happened when she was taking a bath and noticed some aching/cramping over her right lower abdomen.  This episode is not associated with nausea, vomiting, diarrhea, constipation.  This episode seemed to resolve without further issue and happens very infrequently.  She had trouble providing any additional information to further characterize the episode.  Diabetes Her current diabetes medication includes: -Metformin 500 mg BID She does not check her blood sugars at home.  Hypertension Her current antihypertensive regimen includes: -losartan 50 mg -amlodipine 10 mg Missed 1-2 doses/week.  Overactive bladder She has previously been prescribed Myrbetriq 25 mg for overactive bladder.  She tried this medication for 1 month and does not remember if it was helpful.  Ultimately, she did not ask for refills of this medication because it was prohibitively expensive.  She currently wears adult diapers during the day and it does not seem to inconvenience her enough to make medication desirable.  TSH Her current medication includes: -Synthroid 112 mcg  Iron deficiency anemia She reports that she took 1 month of iron supplements and did not ask for any refills once she ran out.    Weight gain She is aware that she has gained about 50 pounds in recent months and is working on nutrition and exercise to help cut down on  her weight.  PERTINENT  PMH / PSH: Previous CVA  OBJECTIVE:   BP 134/60   Pulse 81   Ht 5\' 2"  (1.575 m)   Wt 262 lb 6 oz (119 kg)   SpO2 96%   BMI 47.99 kg/m    General: Alert and cooperative and appears to be in no acute distress Cardio: Normal S1 and S2, no S3 or S4. Rhythm is regular. No murmurs or rubs.   Pulm: Clear to auscultation bilaterally, no crackles, wheezing, or diminished breath sounds. Normal respiratory effort Abdomen: Bowel sounds normal. Abdomen soft and non-tender.  Extremities: No peripheral edema. Warm/ well perfused.  Strong radial pulses. Neuro: Cranial nerves grossly intact  ASSESSMENT/PLAN:   Hypothyroidism -Continue Synthroid 112 mcg -Follow-up TSH  Essential hypertension Moderately well-controlled today.  I would prefer if her blood pressure were below 130/90 due to her history of stroke.  Her blood pressure is mildly elevated today and was found to be 134/60 on repeat.  Chart review shows that this is higher than its been in the past.  I would like to continue to monitor for now but consider starting an additional agent if she remains high at her next checkup.  Urinary incontinence She is not interested in moving forward with another trial of Myrbetriq 50 mg at this time.  We did not discuss oxybutynin and I would prefer to avoid medications with a high risk of adverse effects especially in the setting of polypharmacy.  Type 2 diabetes mellitus, controlled (HCC) A1c 7.0 today.  Well-controlled. -Continue Metformin -Follow-up in 3  months  Iron deficiency anemia Last colonoscopy performed 06/2018 demonstrated no obvious reason for her current bleed.  She is due for a follow-up colonoscopy in 2023.  At her last visit, she denied any obvious signs of bleeding. -Follow-up CBC and ferritin from today   Lower extremity cramping She was told this is often related to mild vitamin deficiencies or tight muscles.  She was encouraged to drink plenty of water,  consider multivitamin and consider routine stretching.  Right lower quadrant discomfort This seems to be a very uncommon event happening only once or twice per month.  She was told it is hard to determine what might be the cause of this discomfort and there does not seem to be enough symptoms to warrant further work-up at this time.  She is content to continue to monitor.  [name], MD [place]

## 2020-09-09 LAB — CBC
Hematocrit: 34.5 % (ref 34.0–46.6)
Hemoglobin: 10.5 g/dL — ABNORMAL LOW (ref 11.1–15.9)
MCH: 22.5 pg — ABNORMAL LOW (ref 26.6–33.0)
MCHC: 30.4 g/dL — ABNORMAL LOW (ref 31.5–35.7)
MCV: 74 fL — ABNORMAL LOW (ref 79–97)
Platelets: 372 10*3/uL (ref 150–450)
RBC: 4.66 x10E6/uL (ref 3.77–5.28)
RDW: 17.4 % — ABNORMAL HIGH (ref 11.7–15.4)
WBC: 9.1 10*3/uL (ref 3.4–10.8)

## 2020-09-09 LAB — TSH: TSH: 38 u[IU]/mL — ABNORMAL HIGH (ref 0.450–4.500)

## 2020-09-09 LAB — FERRITIN: Ferritin: 13 ng/mL — ABNORMAL LOW (ref 15–150)

## 2020-10-12 DIAGNOSIS — E119 Type 2 diabetes mellitus without complications: Secondary | ICD-10-CM | POA: Diagnosis not present

## 2020-10-13 LAB — HM DIABETES EYE EXAM

## 2020-11-08 ENCOUNTER — Encounter: Payer: Self-pay | Admitting: Family Medicine

## 2021-01-31 ENCOUNTER — Encounter: Payer: Self-pay | Admitting: Student

## 2021-01-31 ENCOUNTER — Ambulatory Visit (INDEPENDENT_AMBULATORY_CARE_PROVIDER_SITE_OTHER): Payer: Medicare Other | Admitting: Student

## 2021-01-31 ENCOUNTER — Other Ambulatory Visit: Payer: Self-pay

## 2021-01-31 VITALS — BP 143/78 | HR 84 | Wt 253.6 lb

## 2021-01-31 DIAGNOSIS — E119 Type 2 diabetes mellitus without complications: Secondary | ICD-10-CM | POA: Diagnosis not present

## 2021-01-31 DIAGNOSIS — D508 Other iron deficiency anemias: Secondary | ICD-10-CM

## 2021-01-31 DIAGNOSIS — I10 Essential (primary) hypertension: Secondary | ICD-10-CM | POA: Diagnosis not present

## 2021-01-31 DIAGNOSIS — R7303 Prediabetes: Secondary | ICD-10-CM

## 2021-01-31 DIAGNOSIS — F331 Major depressive disorder, recurrent, moderate: Secondary | ICD-10-CM | POA: Diagnosis not present

## 2021-01-31 DIAGNOSIS — G4733 Obstructive sleep apnea (adult) (pediatric): Secondary | ICD-10-CM

## 2021-01-31 DIAGNOSIS — E039 Hypothyroidism, unspecified: Secondary | ICD-10-CM

## 2021-01-31 LAB — POCT GLYCOSYLATED HEMOGLOBIN (HGB A1C): HbA1c, POC (controlled diabetic range): 7.1 % — AB (ref 0.0–7.0)

## 2021-01-31 MED ORDER — FERROUS SULFATE 324 (65 FE) MG PO TBEC: 1.0000 | DELAYED_RELEASE_TABLET | Freq: Every day | ORAL | 2 refills | Status: DC

## 2021-01-31 MED ORDER — LOSARTAN POTASSIUM 100 MG PO TABS
100.0000 mg | ORAL_TABLET | Freq: Every day | ORAL | 3 refills | Status: AC
Start: 2021-01-31 — End: ?

## 2021-01-31 MED ORDER — CITALOPRAM HYDROBROMIDE 40 MG PO TABS
20.0000 mg | ORAL_TABLET | Freq: Every evening | ORAL | 2 refills | Status: AC
Start: 2021-01-31 — End: ?

## 2021-01-31 MED ORDER — METFORMIN HCL 500 MG PO TABS
500.0000 mg | ORAL_TABLET | Freq: Two times a day (BID) | ORAL | 3 refills | Status: AC
Start: 2021-01-31 — End: ?

## 2021-01-31 MED ORDER — AMLODIPINE BESYLATE 10 MG PO TABS: 10.0000 mg | ORAL_TABLET | Freq: Every day | ORAL | 3 refills | Status: DC

## 2021-01-31 MED ORDER — ATORVASTATIN CALCIUM 80 MG PO TABS: 80.0000 mg | ORAL_TABLET | Freq: Every day | ORAL | 1 refills | Status: AC

## 2021-01-31 MED ORDER — LEVOTHYROXINE SODIUM 125 MCG PO TABS
125.0000 ug | ORAL_TABLET | ORAL | 3 refills | Status: DC
Start: 2021-01-31 — End: 2021-03-07

## 2021-01-31 NOTE — Assessment & Plan Note (Addendum)
Patients last A1c was 7.0 and today was 7.1.  She reports taking her metformin daily and denies any side effects.   -Continue metformin 500 mg twice daily -Recommended patient's start Ozempic due to her interest in weight loss.  Patient declined starting Ozempic stating dislike for injections. -Encourage continued low-carb diet and exercise -Informed patient she was due for an eye exam and recommend she schedules an eye exam as soon as possible.

## 2021-01-31 NOTE — Patient Instructions (Signed)
Morgan Collins  It was wonderful to meet you today. Thank you for allowing me to be a part of your care. Below is a short summary of what we discussed at your visit today:  You are due for your routine eye exam and I will be sending you a referral for eye exam  I will be refilling your medications today and will be switching [facility] pharmacy to [facility]  Increased your Synthroid to 153mg daily.  Put in a referral for your sleep study  Follow up in 4 weeks to monitor weight loss progress and update your vaccination   Please bring all of your medications to every appointment!  If you have any questions or concerns, please do not hesitate to contact [facility] phone or MyChart message.   JAlen Bleacher MD [facility]

## 2021-01-31 NOTE — Assessment & Plan Note (Signed)
Patient's last TSH level was elevated at 38.  Patient reports taking her medication appropriately and consistently.  Concerns for difficulty with weight loss could be attributed to hypothyroidism. -Increase patient's Synthroid from 112 MCG to 125 MCG daily. -Follow-up in 4 weeks to check TSH level

## 2021-01-31 NOTE — Progress Notes (Signed)
    SUBJECTIVE:   CHIEF COMPLAINT / HPI:   [name] is a 65 year old female with past medical history significant for T2DM, hypothyroidism, anemia, and stroke presents today for checkup.  Patient says she is doing well overall and has no complaints today.  She indicates that her main concern is her weight.  Patient reports that she has not been eating as much lately and does not understand why she cannot lose weight.  She says she has not been able to exercise or do much of an activity lately.  She has not been able to take care of herself as she has been taking care of family members who were recently diagnosed with cancer.  She is looking forward to working on herself and improving her overall health including weight.  PERTINENT  PMH / PSH:   PMH: T2DM, HTN, CVA, Depression, Anemia 2/2 low iron, Hypothyroidism.  PSH: Abdominal Hysterectomy, Appendectomy, C-section, Paraththyroidectomy  Review of Systems  Constitutional:  Negative for chills, fever and weight loss.  Eyes:  Negative for blurred vision, double vision, photophobia and pain.  Respiratory:  Negative for cough, hemoptysis and shortness of breath.   Cardiovascular:  Negative for chest pain, palpitations and claudication.  Gastrointestinal:  Negative for abdominal pain, constipation, diarrhea, heartburn, nausea and vomiting.    OBJECTIVE:   BP (!) 143/78   Pulse 84   Wt 253 lb 9.6 oz (115 kg)   SpO2 100%   BMI 46.38 kg/m    Physical Exam General: Alert, well appearing, NAD, Oriented x4 Cardiovascular: RRR, No Murmurs, Normal S2/S2 Respiratory: CTAB, No wheezing or Rales Abdomen: No distension or tenderness Extremities: +1 bilateral LE edema Skin: Warm and dry  ASSESSMENT/PLAN:   Type 2 diabetes mellitus, controlled ([place]) Patients last A1c was 7.0 and today was 7.1.  She reports taking her metformin daily and denies any side effects.   -Continue metformin 500 mg twice daily -Recommended patient's start  Ozempic due to her interest in weight loss.  Patient declined starting Ozempic stating dislike for injections. -Encourage continued low-carb diet and exercise -Informed patient she was due for an eye exam and recommend she schedules an eye exam as soon as possible.  Hypothyroidism Patient's last TSH level was elevated at 38.  Patient reports taking her medication appropriately and consistently.  Concerns for difficulty with weight loss could be attributed to hypothyroidism. -Increase patient's Synthroid from 112 MCG to 125 MCG daily. -Follow-up in 4 weeks to check TSH level  Weight Pt has a BMI of 46.38 and her weight today is 253lbs 9.6oz today.  Patient's hypothyroidism and lack of activity  are most likely contributory to her difficulty with weight loss. -Increased Synthroid to 125 MCG daily -Recommend at least 30 minutes of exercise 4 times a week -Advised diet adjustments -Recommended Ozempic to help with blood glucose control and weight loss.  Patient declined as she is uncomfortable with weekly shots. -Follow-up in 4 weeks to monitor weight progress.   Refilled patient's medications and scheduled follow-up in 4 weeks to monitor her weight, check her TSH and update her vaccination     Alen Bleacher, MD [place]

## 2021-02-02 ENCOUNTER — Other Ambulatory Visit: Payer: Self-pay | Admitting: Student

## 2021-02-02 ENCOUNTER — Telehealth: Payer: Self-pay

## 2021-02-02 DIAGNOSIS — D508 Other iron deficiency anemias: Secondary | ICD-10-CM

## 2021-02-02 MED ORDER — FERROUS SULFATE 325 (65 FE) MG PO TABS: 325.0000 mg | ORAL_TABLET | Freq: Every day | ORAL | 2 refills | Status: AC

## 2021-02-02 NOTE — Progress Notes (Signed)
Updated Patients ferrous sulfate from 324 to '325mg'$  Tab

## 2021-02-02 NOTE — Telephone Encounter (Signed)
Fax received from [facility].  "Please clarify the strength for Ferrous Sulfate. The prescription sent in states Ferrous Sulfate 324 MG but on the directions it states take 325 mg total. Please include new directions, if necessary."  Ottis Stain, CMA

## 2021-02-06 ENCOUNTER — Telehealth: Payer: Self-pay | Admitting: *Deleted

## 2021-02-06 NOTE — Telephone Encounter (Signed)
Discontinued ferrous sulfate 324 mg as Dr. Adah Salvage prescribed her 325 mg recently.

## 2021-02-06 NOTE — Telephone Encounter (Signed)
Received fax requesting clarification for Ferrous Sulfate.  The prescriptions sent in states FERROUS SULFATE 324 MG but on the directions it states take 325 mg total.  Please include new directions if necessary. Zyaire Mccleod Zimmerman Rumple, CMA

## 2021-03-05 ENCOUNTER — Encounter: Payer: Self-pay | Admitting: Student

## 2021-03-05 ENCOUNTER — Ambulatory Visit (INDEPENDENT_AMBULATORY_CARE_PROVIDER_SITE_OTHER): Payer: Medicare Other | Admitting: Student

## 2021-03-05 ENCOUNTER — Other Ambulatory Visit: Payer: Self-pay

## 2021-03-05 VITALS — BP 134/92 | HR 84 | Ht 62.0 in | Wt 257.0 lb

## 2021-03-05 DIAGNOSIS — Z23 Encounter for immunization: Secondary | ICD-10-CM | POA: Diagnosis not present

## 2021-03-05 DIAGNOSIS — I10 Essential (primary) hypertension: Secondary | ICD-10-CM

## 2021-03-05 DIAGNOSIS — E119 Type 2 diabetes mellitus without complications: Secondary | ICD-10-CM

## 2021-03-05 DIAGNOSIS — M7989 Other specified soft tissue disorders: Secondary | ICD-10-CM

## 2021-03-05 DIAGNOSIS — E039 Hypothyroidism, unspecified: Secondary | ICD-10-CM | POA: Diagnosis not present

## 2021-03-05 DIAGNOSIS — R600 Localized edema: Secondary | ICD-10-CM

## 2021-03-05 MED ORDER — HYDROCHLOROTHIAZIDE 12.5 MG PO CAPS: 12.5000 mg | ORAL_CAPSULE | Freq: Every day | ORAL | 2 refills | Status: DC

## 2021-03-05 MED ORDER — TRULICITY 0.75 MG/0.5ML ~~LOC~~ SOAJ: 0.7500 mg | SUBCUTANEOUS | 0 refills | Status: DC

## 2021-03-05 NOTE — Patient Instructions (Addendum)
It was wonderful to see you today. Thank you for allowing me to be a part of your care. Below is a short summary of what we discussed at your visit today:  We will check your TSH, CMP and CBC  level today  Put in a referral for your Eye exam and for [employer] exercise and training to help get you a personal trainer.  I recommend you continue working on diet and exercise to improve your weight  Will stop your Amlodipine and switch to HCTZ 12.5G. Due to low leg swelling from Amilodipine  I have put in and on Trulicity to help with weigh loss and your Diabetes. It can cause nausea which is the main side effect.   Follow up in 4weeks    Please bring all of your medications to every appointment!  If you have any questions or concerns, please do not hesitate to contact us via phone or MyChart message.   Alen Bleacher, MD [facility]

## 2021-03-05 NOTE — Assessment & Plan Note (Addendum)
Patient reports concerns for her weight.  Her last weight 4 weeks ago was 253lb and her weight today is 257lbs. she has made slight adjustments to her diet but reports not engaging in exercise and low activity overall. Patient's hx of hypothyroidism can be contributory to her obesity.  She is interested in possibly getting an exercise trainer. Discussed the option of Ozempic for weight control and diabetes 4 weeks ago, patient declined the option at that time.  However today she expressed interest in using medication that could help with her weight loss. Informed patient Ozempic is back ordered at this time but we can start her on Trulicity. I Demonstrated how to inject the medication (Trulicity) and discussed the side effects with patient. -Ordered Trulicity 0.75mg  daily -Encourage patient to continue diet adjustments and increase physical activity -Placed referral to Referral Provider Exercise Program.  -Follow-up in 4 weeks

## 2021-03-05 NOTE — Assessment & Plan Note (Addendum)
Patient reports leg swelling that started 3 weeks ago.  She said for the past 3 years she has been having occasional leg swelling which usually resolves with leg raise. She denies having any leg pain, chest pain or difficulty breathing. Her physical exam is positive for 2+ pitting edema on the lower extremity bilaterally.  Patient's bilateral lower extremity edema is most likely due to side effect of her amlodipine which she takes for blood pressure control.  Other differentials to consider include liver cirrhosis and nephrosis which can cause low proteins.  Will order CMP to rule out liver or kidney etiology. Her history of anemia and Hypothyroidism  can be contributory to her presentation. However the presentation of pitting edema makes hypothyroidism less likely. -Stopped amlodipine, switch to HCTZ 12.5 mg daily -Ordered CMP and CBC lab -Follow-up in 4 weeks or sooner as needed

## 2021-03-05 NOTE — Progress Notes (Signed)
SUBJECTIVE:   CHIEF COMPLAINT / HPI:   [name] is a 65 year old female with past medical history of primary hyperparathyroidism who presents today for follow-up on her TSH levels.Her TSH was elevated at the last visit 4 weeks ago in patient's Synthroid was increased from 112 mg to '125mg'$ .  Patient reports she has been been taking daily 112 mg dose in the last 4 weeks.  She denies having any symptoms of fatigue, depression, constipation and cold.  Patient states she feels like she is gaining some weight, but mostly due to inactivity.  Leg swelling Patient reports noticing swelling on both legs that started 3 weeks ago.  She says she has been having leg swelling on and off for the past 3 years.  She denies any leg pain but notes discomfort with wearing tight shoes.  She reports in the past that raising her legs help relieve the swelling. Patient denies any episodes of dizziness, chest pain, or  difficulty breathing at night.    PERTINENT  PMH / PSH:  T2DM HTN Primary Hypothyroidism Primary parathyroidism Stroke  Review of Systems  Constitutional:  Negative for chills, fever and malaise/fatigue.  Eyes:  Negative for blurred vision, double vision, photophobia and pain.  Respiratory:  Negative for cough, hemoptysis, sputum production and shortness of breath.   Cardiovascular:  Positive for leg swelling. Negative for chest pain, palpitations, claudication and PND.  Gastrointestinal:  Negative for abdominal pain, constipation, diarrhea, heartburn, nausea and vomiting.  Genitourinary:  Negative for dysuria, frequency, hematuria and urgency.  Neurological:  Negative for dizziness, focal weakness, weakness and headaches.     OBJECTIVE:   BP (!) 134/92   Pulse 84   Ht '5\' 2"'$  (1.575 m)   Wt 116.6 kg   SpO2 100%   BMI 47.01 kg/m     Physical Exam General: Alert, well appearing, NAD, Oriented x4 Cardiovascular: RRR, No Murmurs, Normal S2/S2 Respiratory: CTAB, No wheezing or  Rales Abdomen: No distension or tenderness Extremities: Bilateral LE +2 pitting edema, 2+ radial and pedal pulses  skin: Warm and dry  ASSESSMENT/PLAN:   Hypothyroidism Patient's last TSH level 5 months ago was elevated (38).  Her Synthroid was increased from 112 mcg to 125 mcg daily 4 weeks ago however patient report that she's been taking the 112 mcg in the last 4 weeks. She denies having any episodes of fatigue, depression, constipation or cold. -Advised patient to start the Synthroid 125mg doze -Ordered TSH labs -Follow-up in 4 weeks.   Leg swelling Patient reports leg swelling that started 3 weeks ago.  She said for the past 3 years she has been having occasional leg swelling which usually resolves with leg raise. She denies having any leg pain, chest pain or difficulty breathing. Her physical exam is positive for 2+ pitting edema on the lower extremity bilaterally.  Patient's bilateral lower extremity edema is most likely due to side effect of her amlodipine which she takes for blood pressure control.  Other differentials to consider include liver cirrhosis and nephrosis which can cause low proteins.  Will order CMP to rule out liver or kidney etiology. Her history of anemia and Hypothyroidism  can be contributory to her presentation. However the presentation of pitting edema makes hypothyroidism less likely. -Stopped amlodipine, switch to HCTZ 12.5 mg daily -Ordered CMP and CBC lab -Follow-up in 4 weeks or sooner as needed   Obesities, morbid Patient reports concerns for her weight.  Her last weight 4 weeks ago was 253lb and  her weight today is 257lbs. she has made slight adjustments to her diet but reports not engaging in exercise and low activity overall. Patient's hx of hypothyroidism can be contributory to her obesity.  She is interested in possibly getting an exercise trainer. Discussed the option of Ozempic for weight control and diabetes 4 weeks ago, patient declined the option  at that time.  However today she expressed interest in using medication that could help with her weight loss. Informed patient Ozempic is back ordered at this time but we can start her on Trulicity. I Demonstrated how to inject the medication (Trulicity) and discussed the side effects with patient. -Ordered Trulicity 0.'75mg'$  daily -Encourage patient to continue diet adjustments and increase physical activity -Placed referral to Referral Provider Exercise Program.  -Follow-up in 4 weeks    Health maintenance -Patient received her flu vaccine today -Placed referral for ophthalmology, for annual eye exam.   Alen Bleacher, MD [place]

## 2021-03-05 NOTE — Assessment & Plan Note (Signed)
Patient's last TSH level 5 months ago was elevated (38).  Her Synthroid was increased from 112 mcg to 125 mcg daily 4 weeks ago however patient report that she's been taking the 112 mcg in the last 4 weeks. She denies having any episodes of fatigue, depression, constipation or cold. -Advised patient to start the Synthroid 147mg doze -Ordered TSH labs -Follow-up in 4 weeks.

## 2021-03-06 LAB — COMPREHENSIVE METABOLIC PANEL
ALT: 19 IU/L (ref 0–32)
AST: 16 IU/L (ref 0–40)
Albumin/Globulin Ratio: 1.3 (ref 1.2–2.2)
Albumin: 4.1 g/dL (ref 3.8–4.8)
Alkaline Phosphatase: 89 IU/L (ref 44–121)
BUN/Creatinine Ratio: 22 (ref 12–28)
BUN: 15 mg/dL (ref 8–27)
Bilirubin Total: 0.2 mg/dL (ref 0.0–1.2)
CO2: 24 mmol/L (ref 20–29)
Calcium: 9.8 mg/dL (ref 8.7–10.3)
Chloride: 104 mmol/L (ref 96–106)
Creatinine, Ser: 0.69 mg/dL (ref 0.57–1.00)
Globulin, Total: 3.1 g/dL (ref 1.5–4.5)
Glucose: 89 mg/dL (ref 65–99)
Potassium: 4.7 mmol/L (ref 3.5–5.2)
Sodium: 145 mmol/L — ABNORMAL HIGH (ref 134–144)
Total Protein: 7.2 g/dL (ref 6.0–8.5)
eGFR: 96 mL/min/{1.73_m2} (ref >59)

## 2021-03-06 LAB — TSH: TSH: 1.12 u[IU]/mL (ref 0.450–4.500)

## 2021-03-06 LAB — CBC
Hematocrit: 34 % (ref 34.0–46.6)
Hemoglobin: 10.2 g/dL — ABNORMAL LOW (ref 11.1–15.9)
MCH: 21.7 pg — ABNORMAL LOW (ref 26.6–33.0)
MCHC: 30 g/dL — ABNORMAL LOW (ref 31.5–35.7)
MCV: 72 fL — ABNORMAL LOW (ref 79–97)
Platelets: 350 10*3/uL (ref 150–450)
RBC: 4.7 x10E6/uL (ref 3.77–5.28)
RDW: 17.3 % — ABNORMAL HIGH (ref 11.7–15.4)
WBC: 9 10*3/uL (ref 3.4–10.8)

## 2021-03-07 ENCOUNTER — Telehealth: Payer: Self-pay

## 2021-03-07 ENCOUNTER — Other Ambulatory Visit: Payer: Self-pay | Admitting: Student

## 2021-03-07 DIAGNOSIS — E039 Hypothyroidism, unspecified: Secondary | ICD-10-CM

## 2021-03-07 MED ORDER — LEVOTHYROXINE SODIUM 112 MCG PO TABS: 112.0000 ug | ORAL_TABLET | ORAL | 3 refills | Status: DC

## 2021-03-07 NOTE — Progress Notes (Signed)
Medication Update.  Patient's dose was increased from 135mcg to 125 mcg due to elevated TSH. Her TSH came back to normal levels when she started taking her 184mcg as prescribed. Discontinued the order for 19mcg. Patient will continue on 13mcg dose.

## 2021-03-07 NOTE — Telephone Encounter (Signed)
Patient LVM on nurse line stating Trulicity is too expensive. I called the pharmacy to see if a PA would help cost. Per pharmacist, the medication does not need a PA, however it does have a high copay of $140 dollars.   Patient will not be starting this medication at this time due to cost.   Will forward to PCP and pharmacy team.

## 2021-03-08 NOTE — Telephone Encounter (Signed)
Pt should qualify for assistance. I can reach out to see if she'd like to fill out a [employer] app!

## 2021-03-23 ENCOUNTER — Telehealth: Payer: Self-pay

## 2021-03-23 NOTE — Telephone Encounter (Signed)
Follow-up call- Left VM regarding possible pt assistance with [employer]. Gave call back number [phone].  MyChart message was unread.

## 2021-03-23 NOTE — Telephone Encounter (Signed)
Pt returned phone call.   She is interested in moving forward with the [employer] application and will come by the office at her earliest convenience to sign and provide proof of income.

## 2021-03-29 ENCOUNTER — Encounter: Payer: Self-pay | Admitting: Student

## 2021-03-29 ENCOUNTER — Other Ambulatory Visit: Payer: Self-pay

## 2021-03-29 ENCOUNTER — Ambulatory Visit (INDEPENDENT_AMBULATORY_CARE_PROVIDER_SITE_OTHER): Payer: Medicare Other | Admitting: Student

## 2021-03-29 DIAGNOSIS — I10 Essential (primary) hypertension: Secondary | ICD-10-CM | POA: Diagnosis not present

## 2021-03-29 DIAGNOSIS — E119 Type 2 diabetes mellitus without complications: Secondary | ICD-10-CM

## 2021-03-29 NOTE — Assessment & Plan Note (Addendum)
Patient blood pressure today was 111/70. Her BP medication was recently changed from amlodipine to HCTZ due to bilateral LE edema. Patient exam was normal and she had no LE edema. She will continue her HCTZ and follow up in  6 weeks.  -Continue HCTZ 12.5 mg daily -BMP ordered: Check K -Follow-up in 6 weeks

## 2021-03-29 NOTE — Assessment & Plan Note (Signed)
Patient with hx of T2DM is overdue for foot exam. Her annual foot exam was completed. Exam was normal; she had no lesions and sensation was intact.

## 2021-03-29 NOTE — Patient Instructions (Addendum)
It was wonderful to see you today. Thank you for allowing me to be a part of your care. Below is a short summary of what we discussed at your visit today:  Your blood pressure has improved since last visit and the leg swelling has improved as well. We will continue with the HCTZ medication.  I ordered a BMP lab today to check your potassium  Your annual foot exam was completed today and it was normal.   Follow up in 6 weeks  If you have any questions or concerns, please do not hesitate to contact us via phone or MyChart message.   Alen Bleacher, MD [facility]

## 2021-03-29 NOTE — Progress Notes (Signed)
    SUBJECTIVE:   CHIEF COMPLAINT / HPI: Blood pressure follow up  [name] is 65 year old female who presented today for blood pressure follow up after recent change in her medication.  Patient states she no longer has the leg swelling since she started the new medication.  She says she is able to start wearing her shoes and some of those discomfort she had with her shoes seem to have resolved.  She endorses increased voiding with new medication but denies any inconvenience today. She has no headaches dizziness or shortness of breath.  At patient's last visit 4 weeks ago, her amlodipine was discontinued due to complaint of bilateral LE edema. At that time she was started on HCTZ for BP control.   PERTINENT  PMH / PSH: HTN, T2DM, Stroke and Hypothyroidism   OBJECTIVE:   BP 111/70   Pulse 86   Ht 5\' 2"  (1.575 m)   Wt 255 lb 9.6 oz (115.9 kg)   SpO2 100%   BMI 46.75 kg/m    Physical Exam General: Alert, well appearing, NAD, Oriented x4 Cardiovascular: RRR, No Murmurs, Normal S2/S2 Respiratory: CTAB, No wheezing or Rales Abdomen: No distension or tenderness Extremities: No edema on extremities   Skin: Warm and dry  ASSESSMENT/PLAN:   Essential hypertension Patient blood pressure today was 111/70. Her BP medication was recently changed from amlodipine to HCTZ due to bilateral LE edema. Patient exam was normal and she had no LE edema. She will continue her HCTZ and follow up in  6 weeks.  -Continue HCTZ 12.5 mg daily -BMP ordered: Check K -Follow-up in 6 weeks  Type 2 diabetes mellitus, controlled ([place]) Patient with hx of T2DM is overdue for foot exam. Her annual foot exam was completed. Exam was normal; she had no lesions and sensation was intact.     Alen Bleacher, MD [place]

## 2021-03-30 ENCOUNTER — Other Ambulatory Visit: Payer: Medicare Other

## 2021-03-30 DIAGNOSIS — I10 Essential (primary) hypertension: Secondary | ICD-10-CM | POA: Diagnosis not present

## 2021-03-31 LAB — BASIC METABOLIC PANEL
BUN/Creatinine Ratio: 20 (ref 12–28)
BUN: 17 mg/dL (ref 8–27)
CO2: 23 mmol/L (ref 20–29)
Calcium: 9.6 mg/dL (ref 8.7–10.3)
Chloride: 100 mmol/L (ref 96–106)
Creatinine, Ser: 0.85 mg/dL (ref 0.57–1.00)
Glucose: 201 mg/dL — ABNORMAL HIGH (ref 70–99)
Potassium: 4.2 mmol/L (ref 3.5–5.2)
Sodium: 139 mmol/L (ref 134–144)
eGFR: 76 mL/min/{1.73_m2} (ref >59)

## 2021-04-09 ENCOUNTER — Telehealth: Payer: Self-pay

## 2021-04-09 NOTE — Telephone Encounter (Signed)
Received request to change pt pharmacy to [facility]. I called and spoke with the pt. Pt does not want to change pharmacies t [facility]. Pt wants to keep with the pharmacies she has on file with [place]. Ottis Stain, CMA

## 2021-04-12 ENCOUNTER — Ambulatory Visit (HOSPITAL_BASED_OUTPATIENT_CLINIC_OR_DEPARTMENT_OTHER): Payer: Medicare Other | Attending: Family Medicine | Admitting: Internal Medicine

## 2021-04-12 ENCOUNTER — Other Ambulatory Visit: Payer: Self-pay

## 2021-04-12 DIAGNOSIS — G4733 Obstructive sleep apnea (adult) (pediatric): Secondary | ICD-10-CM | POA: Insufficient documentation

## 2021-04-15 DIAGNOSIS — G4733 Obstructive sleep apnea (adult) (pediatric): Secondary | ICD-10-CM

## 2021-04-15 NOTE — Procedures (Signed)
   Patient Name: Morgan Collins, Morgan Collins Date: 04/12/2021 Gender: Female D.O.B: February 28, 1956 Age (years): 33 Referring Provider: Madison Hickman Height (inches): 82 Interpreting Physician: Baird Lyons MD, ABSM Weight (lbs): 250 RPSGT: Baxter Flattery BMI: 22 MRN: [id number]  CLINICAL INFORMATION Sleep Study Type: NPSG Indication for sleep study: Diabetes, Fatigue, Hypertension, Obesity, Snoring, Witnesses Apnea / Gasping During Sleep Epworth Sleepiness Score: 12  Most recent polysomnogram was on 09/19/2017. Most recent titration study was on 12/07/2015. SLEEP STUDY TECHNIQUE As per the AASM Manual for the Scoring of Sleep and Associated Events v2.3 (April 2016) with a hypopnea requiring 4% desaturations.  The channels recorded and monitored were frontal, central and occipital EEG, electrooculogram (EOG), submentalis EMG (chin), nasal and oral airflow, thoracic and abdominal wall motion, anterior tibialis EMG, snore microphone, electrocardiogram, and pulse oximetry.  MEDICATIONS Medications self-administered by patient taken the night of the study : TYLENOL, AMLODIPINE, ASPIRIN, LIPITOR, LOSARTAN, METFORMIN, CITALOPRAM, ATORVASTATIN  SLEEP ARCHITECTURE The study was initiated at 10:35:53 PM and ended at 4:56:47 AM.  Sleep onset time was 0.3 minutes and the sleep efficiency was 78.7%%. The total sleep time was 299.6 minutes.  Stage REM latency was N/A minutes.  The patient spent 1.7%% of the night in stage N1 sleep, 98.3%% in stage N2 sleep, 0.0%% in stage N3 and 0% in REM.  Alpha intrusion was absent.  Supine sleep was 48.10%.  RESPIRATORY PARAMETERS The overall apnea/hypopnea index (AHI) was 45.3 per hour. There were 18 total apneas, including 18 obstructive, 0 central and 0 mixed apneas. There were 208 hypopneas and 1 RERAs.  The AHI during Stage REM sleep was N/A per hour.  AHI while supine was 25.0 per hour.  The mean oxygen saturation was 90.7%. The minimum SpO2 during  sleep was 81.0%.  moderate snoring was noted during this study.  CARDIAC DATA The 2 lead EKG demonstrated sinus rhythm. The mean heart rate was 82.9 beats per minute. Other EKG findings include: None.  LEG MOVEMENT DATA The total PLMS were 0 with a resulting PLMS index of 0.0. Associated arousal with leg movement index was 0.0 .  IMPRESSIONS - Severe obstructive sleep apnea occurred during this study (AHI = 45.3/h). - Oxygen desaturation was noted during this study (Min O2 = 81.0%).m Mean O2 saturation 90.7% - The patient snored with moderate snoring volume. - No cardiac abnormalities were noted during this study. - Clinically significant periodic limb movements did not occur during sleep. No significant associated arousals.  DIAGNOSIS - Obstructive Sleep Apnea (G47.33)  RECOMMENDATIONS - Suggest CPAP titration sleep study or autopap. Other options would bee based on clinical judgment. - Be careful with alcohol, sedatives and other CNS depressants that may worsen sleep apnea and disrupt normal sleep architecture. - Sleep hygiene should be reviewed to assess factors that may improve sleep quality. - Weight management and regular exercise should be initiated or continued if appropriate.  [Electronically signed] 04/15/2021 10:40 AM  Baird Lyons MD, ABSM Diplomate, American Board of Sleep Medicine   NPI: [id number]                          [place] [place] [facility]  ELECTRONICALLY SIGNED ON:  04/15/2021, 10:37 AM [place] PH: [phone]   FX: [phone] [place]

## 2021-04-16 ENCOUNTER — Telehealth: Payer: Self-pay

## 2021-04-16 ENCOUNTER — Encounter (HOSPITAL_COMMUNITY): Payer: Self-pay | Admitting: Emergency Medicine

## 2021-04-16 ENCOUNTER — Ambulatory Visit (HOSPITAL_COMMUNITY)
Admission: EM | Admit: 2021-04-16 | Discharge: 2021-04-16 | Disposition: A | Discharge status: Home / Self Care | Payer: Medicare Other

## 2021-04-16 ENCOUNTER — Other Ambulatory Visit: Payer: Self-pay

## 2021-04-16 DIAGNOSIS — H532 Diplopia: Secondary | ICD-10-CM | POA: Diagnosis not present

## 2021-04-16 NOTE — Discharge Instructions (Signed)
GO to eye appointment tomorrow at 1030 to be evaluated

## 2021-04-16 NOTE — Telephone Encounter (Signed)
Patient calls nurse line regarding double vision in right eye. Patient reports this has been going on since she left sleep study on 10/27.  Patient states that she is having both double and blurry vision in right eye. Denies headaches or chest pain. States that she feels fine other than visual changes.   Patient states that she is not currently established with an eye doctor.   Recommended that patient be evaluated today in urgent care as we do not have any clinic appointments until Thursday.   Patient verbalizes understanding.   Talbot Grumbling, RN

## 2021-04-16 NOTE — ED Triage Notes (Signed)
Pt presents with double vision in right eye since sleep study on 28th.

## 2021-04-16 NOTE — ED Provider Notes (Signed)
[facility]    CSN: [id number] Arrival date & time: 04/16/21  1052      History   Chief Complaint Chief Complaint  Patient presents with   Diplopia    HPI Morgan Collins is a 65 y.o. female.   Patient presents with right eye double vision for 3 days beginning after sleep study.  First noticed symptoms while driving home and symptoms have been worsened with driving.  Endorses double vision is vertical.  Using over-the-counter reading glasses. denies headaches, dizziness, lightheadedness, floaters, photophobia, eye pain, itching or redness.  History of diabetes, stroke, hypothyroidism, hypertension, hyperlipidemia.  Past Medical History:  Diagnosis Date   Complication of anesthesia    encephalitis in coma 23 days- trach -reversed-no issues now   Depressed    Diabetes mellitus without complication ([place])    type 2   High cholesterol    Hypertension    Hypothyroidism    Sleep apnea    do not use cpap   Stroke ([place]) 2015   slower ,but not a problem; right thalamic hemorrhage 07/2013   Thyroid disease     Patient Active Problem List   Diagnosis Date Noted   Iron deficiency anemia 02/23/2020   Urinary incontinence 12/28/2019   Leg swelling 09/14/2019   Carpal tunnel syndrome of right wrist 04/10/2018   History of colonic polyps 04/10/2018   OSA (obstructive sleep apnea) 09/19/2015   Type 2 diabetes mellitus, controlled ([place]) 03/23/2015   Hyperparathyroidism, primary ([place]) 02/09/2015   Vitamin D deficiency 02/09/2015   Osteoporosis 12/28/2014   Depression 10/25/2014   Nontraumatic subcortical hemorrhage of cerebral hemisphere ([place]) 03/08/2014   HLD (hyperlipidemia) 03/08/2014   Essential hypertension 03/08/2014   Obesities, morbid ([place]) 11/22/2013   Hypothyroidism 08/18/2013   Thalamic hemorrhage ([place]) 08/04/2013   Stroke ([place]) 07/27/2013   Obstructive hydrocephalus ([place]) 07/27/2013    Past Surgical History:  Procedure Laterality Date   ABDOMINAL  HYSTERECTOMY     APPENDECTOMY     c sections     COLONOSCOPY WITH PROPOFOL N/A 12/30/2014   Procedure: COLONOSCOPY WITH PROPOFOL;  Surgeon: Carol Ada, MD;  Location: [facility];  Service: Endoscopy;  Laterality: N/A;   COLONOSCOPY WITH PROPOFOL N/A 07/03/2018   Procedure: COLONOSCOPY WITH PROPOFOL;  Surgeon: Carol Ada, MD;  Location: [facility];  Service: Endoscopy;  Laterality: N/A;   PARATHYROIDECTOMY Right 02/18/2017   Procedure: RIGHT INFERIOR PARATHYROIDECTOMY;  Surgeon: Armandina Gemma, MD;  Location: [facility];  Service: General;  Laterality: Right;   POLYPECTOMY  07/03/2018   Procedure: POLYPECTOMY;  Surgeon: Carol Ada, MD;  Location: [facility];  Service: Endoscopy;;   TRACHEOSTOMY     past history of early 20's- encephalitis coma for 23 days-1982    OB History   No obstetric history on file.      Home Medications    Prior to Admission medications   Medication Sig Start Date End Date Taking? Authorizing Provider  acetaminophen (TYLENOL) 500 MG tablet Take 1,000-1,500 mg by mouth daily as needed.    [provider]  aspirin EC 81 MG tablet Take 1 tablet (81 mg total) by mouth daily. 02/23/20   [name], MD  atorvastatin (LIPITOR) 80 MG tablet Take 1 tablet (80 mg total) by mouth daily. 01/31/21   Alen Bleacher, MD  citalopram (CELEXA) 40 MG tablet Take 0.5 tablets (20 mg total) by mouth every evening. 01/31/21   Alen Bleacher, MD  Coenzyme Q10 (MEGA COQ10 PO) Take 2 capsules by mouth daily.  [provider]  Cyanocobalamin (B-12 PO) Take 1 tablet by mouth daily.    [provider]  Dulaglutide (TRULICITY) 1.60 VP/7.1GG SOPN Inject 0.75 mg into the skin once a week. 03/05/21   Alen Bleacher, MD  ECHINACEA PO Take 1 capsule by mouth daily.    [provider]  ferrous sulfate (FERROUSUL) 325 (65 FE) MG tablet Take 1 tablet (325 mg total) by mouth daily with breakfast. 02/02/21 05/03/21  Alen Bleacher, MD  hydrochlorothiazide (MICROZIDE)  12.5 MG capsule Take 1 capsule (12.5 mg total) by mouth daily. 03/05/21 06/03/21  Alen Bleacher, MD  levothyroxine (SYNTHROID) 112 MCG tablet Take 1 tablet (112 mcg total) by mouth every morning. 30 minutes before food 03/07/21   Alen Bleacher, MD  losartan (COZAAR) 100 MG tablet Take 1 tablet (100 mg total) by mouth daily. 01/31/21   Alen Bleacher, MD  metFORMIN (GLUCOPHAGE) 500 MG tablet Take 1 tablet (500 mg total) by mouth 2 (two) times daily with a meal. 01/31/21   Alen Bleacher, MD    Family History Family History  Problem Relation Age of Onset   Congestive Heart Failure Mother    Hypertension Mother    Alcohol abuse Father    Cancer Sister        Breast   Dementia Brother     Social History Social History   Tobacco Use   Smoking status: Never   Smokeless tobacco: Never  Vaping Use   Vaping Use: Never used  Substance Use Topics   Alcohol use: No   Drug use: No     Allergies   Patient has no known allergies.   Review of Systems Review of Systems  Constitutional: Negative.   HENT: Negative.    Eyes:  Positive for visual disturbance. Negative for photophobia, pain, discharge, redness and itching.  Respiratory: Negative.    Cardiovascular: Negative.   Skin: Negative.   Neurological: Negative.     Physical Exam Triage Vital Signs ED Triage Vitals  Enc Vitals Group     BP 04/16/21 1317 130/87     Pulse Rate 04/16/21 1317 79     Resp 04/16/21 1317 16     Temp 04/16/21 1317 98.4 F (36.9 C)     Temp Source 04/16/21 1317 Oral     SpO2 04/16/21 1317 96 %     Weight --      Height --      Head Circumference --      Peak Flow --      Pain Score 04/16/21 1314 0     Pain Loc --      Pain Edu? --      Excl. in [place]? --    No data found.  Updated Vital Signs BP 130/87 (BP Location: Left Arm)   Pulse 79   Temp 98.4 F (36.9 C) (Oral)   Resp 16   SpO2 96%   Visual Acuity Right Eye Distance:   Left Eye Distance:   Bilateral Distance:    Right Eye Near:    Left Eye Near:    Bilateral Near:     Physical Exam Constitutional:      Appearance: Normal appearance. She is normal weight.  HENT:     Head: Normocephalic.  Eyes:     General: Lids are normal. Lids are everted, no foreign bodies appreciated. Vision grossly intact. Gaze aligned appropriately.     Extraocular Movements: Extraocular movements intact.     Conjunctiva/sclera: Conjunctivae normal.  Pulmonary:  Effort: Pulmonary effort is normal.     Breath sounds: Normal breath sounds.  Skin:    General: Skin is warm and dry.  Neurological:     Mental Status: She is alert and oriented to person, place, and time. Mental status is at baseline.  Psychiatric:        Mood and Affect: Mood normal.        Behavior: Behavior normal.     UC Treatments / Results  Labs (all labs ordered are listed, but only abnormal results are displayed) Labs Reviewed - No data to display  EKG   Radiology No results found.  Procedures Procedures (including critical care time)  Medications Ordered in UC Medications - No data to display  Initial Impression / Assessment and Plan / UC Course  I have reviewed the triage vital signs and the nursing notes.  Pertinent labs & imaging results that were available during my care of the patient were reviewed by me and considered in my medical decision making (see chart for details).  Diplopia  Neurologically intact, vital signs stable, on call ophthalmologist notified, able to get appointment at [facility] retina tomorrow at 1030 for further evaluation Final Clinical Impressions(s) / UC Diagnoses   Final diagnoses:  Diplopia     Discharge Instructions      GO to eye appointment tomorrow at 1030 to be evaluated    ED Prescriptions   None    PDMP not reviewed this encounter.   Hans Eden, [place] 04/16/21 [id number]

## 2021-04-17 DIAGNOSIS — H25813 Combined forms of age-related cataract, bilateral: Secondary | ICD-10-CM | POA: Diagnosis not present

## 2021-04-17 DIAGNOSIS — E119 Type 2 diabetes mellitus without complications: Secondary | ICD-10-CM | POA: Diagnosis not present

## 2021-04-17 DIAGNOSIS — H35361 Drusen (degenerative) of macula, right eye: Secondary | ICD-10-CM | POA: Diagnosis not present

## 2021-04-17 DIAGNOSIS — H5051 Esophoria: Secondary | ICD-10-CM | POA: Diagnosis not present

## 2021-04-17 DIAGNOSIS — H532 Diplopia: Secondary | ICD-10-CM | POA: Diagnosis not present

## 2021-04-17 DIAGNOSIS — H35033 Hypertensive retinopathy, bilateral: Secondary | ICD-10-CM | POA: Diagnosis not present

## 2021-04-19 DIAGNOSIS — H53462 Homonymous bilateral field defects, left side: Secondary | ICD-10-CM | POA: Diagnosis not present

## 2021-04-19 DIAGNOSIS — H5022 Vertical strabismus, left eye: Secondary | ICD-10-CM | POA: Diagnosis not present

## 2021-04-19 DIAGNOSIS — H532 Diplopia: Secondary | ICD-10-CM | POA: Diagnosis not present

## 2021-04-19 DIAGNOSIS — H2513 Age-related nuclear cataract, bilateral: Secondary | ICD-10-CM | POA: Diagnosis not present

## 2021-05-07 ENCOUNTER — Ambulatory Visit (INDEPENDENT_AMBULATORY_CARE_PROVIDER_SITE_OTHER): Payer: Medicare Other

## 2021-05-07 ENCOUNTER — Encounter: Payer: Self-pay | Admitting: Student

## 2021-05-07 ENCOUNTER — Other Ambulatory Visit: Payer: Self-pay

## 2021-05-07 ENCOUNTER — Ambulatory Visit (INDEPENDENT_AMBULATORY_CARE_PROVIDER_SITE_OTHER): Payer: Medicare Other | Admitting: Student

## 2021-05-07 VITALS — BP 138/82 | HR 80 | Ht 62.0 in | Wt 255.0 lb

## 2021-05-07 DIAGNOSIS — Z23 Encounter for immunization: Secondary | ICD-10-CM

## 2021-05-07 DIAGNOSIS — E119 Type 2 diabetes mellitus without complications: Secondary | ICD-10-CM

## 2021-05-07 DIAGNOSIS — I10 Essential (primary) hypertension: Secondary | ICD-10-CM

## 2021-05-07 NOTE — Patient Instructions (Signed)
It was wonderful to see you today. Thank you for allowing me to be a part of your care. Below is a short summary of what we discussed at your visit today:  Blood pressure on recheck is improved.Continue your medications as prescribed.   Sleep apnea should improve with weight loss. Double vision from sleep study, will wait to get the result of your MRI and follow up in 2 weeks  Weight is unchanged. Will touch base with pharmacy to find alternative that will be covered by [facility]   Completed the PCV20 pneumococcal and Covid booster vaccine   Follow up in 4 weeks for double      If you have any questions or concerns, please do not hesitate to contact us via phone or MyChart message.   Alen Bleacher, MD [facility]

## 2021-05-07 NOTE — Progress Notes (Signed)
    SUBJECTIVE:   CHIEF COMPLAINT / HPI: Blood pressure and weight Follow  Patient presented today for a follow up of her blood pressure. She was recently started on HCTZ and amlodipine was discontinued at that time due to bilateral lower extremity edema.  Patient endorses resolution of swelling on both legs.  She denies having any headaches, dizziness, lightheadedness or muscle cramps.    Patient reports feeling well today other than double vision on the right.  Had double vision on the right eyes started about 2 to 3 weeks ago shortly after she completed her sleep study.  The morning after sleep study patient reported noticing the white lines on the road with a crooked on her way to work and rule signs of he had doubling on the right eye.  She reports her left eye to be normal.  Denies any headache dizziness or eye pain.  Patient says she saw an ophthalmologist who ruled out diabetic retinopathy and recommend MRI study which is scheduled for Wednesday 11/23. Patient reports non compliance with her thyroid medication  PERTINENT  PMH / PSH: HTN, T2DM, Hypothyroidism   OBJECTIVE:   BP 138/82   Pulse 80   Ht 5\' 2"  (1.575 m)   Wt 115.7 kg   SpO2 98%   BMI 46.64 kg/m     Physical Exam General: Alert, well appearing, NAD, Oriented x3 Eye: pupil equally reactive to light, centrally focused, normal ocular movement, no conjunctiva injection Cardiovascular: RRR, No Murmurs, Normal S2/S2 Respiratory: CTAB, No wheezing or Rales Abdomen: No distension or tenderness Extremities: No edema on extremities   Skin: Warm and dry Neuro: CN II-XII intact, no focal neuro deficit, muscle strength on all extremities 5/5 and sensation intact.  ASSESSMENT/PLAN:   Essential hypertension Blood pressure today was 150/80.  On recheck blood pressure was 138/82.  Patient reports her blood pressure will have been elevated due to anxiousness from her anticipation of getting her COVID and pneumococcal vaccine  shots.  She endorses taking her medication and denies having any lower extremity edema, dizziness, or muscle cramps. -Continue taking medications as prescribed -Recommend routine blood pressure checks at the pharmacy.  Right Eye Diplopia Patient with new onset right eye diplopia following her sleep study.  Recent ophthalmology's exam ruled out diabetic retinopathy. On exam patient has normal light reaction, pupils are equal in seize and centrally located.  Cranial nerve exam was normal and no focal neuro deficits appreciated on exam.  She denies any muscle weakness.  Some differentials to consider include multiple sclerosis and myasthenia gravis.  Did endorse clients with her thyroid medication, there is also consideration for thyroid disease process. -Follow-up with MRI studies scheduled for 11/23 -Follow-up in 4 weeks or sooner with worsening symptoms.  Patient received her Covid Booster and PCV 20 today.      Alen Bleacher, MD [place]

## 2021-05-07 NOTE — Assessment & Plan Note (Addendum)
Blood pressure today was 150/80.  On recheck blood pressure was 138/82.  Patient reports her blood pressure was elevated due to anxiousness from anticipation of getting her COVID and pneumococcal vaccine shots.  She endorses taking her medication and denies having any lower extremity edema, dizziness, or muscle cramps. -Continue taking medications as prescribed -Recommend routine blood pressure checks at the pharmacy.

## 2021-05-09 DIAGNOSIS — G9389 Other specified disorders of brain: Secondary | ICD-10-CM | POA: Diagnosis not present

## 2021-05-09 DIAGNOSIS — I6381 Other cerebral infarction due to occlusion or stenosis of small artery: Secondary | ICD-10-CM | POA: Diagnosis not present

## 2021-05-11 ENCOUNTER — Other Ambulatory Visit: Payer: Self-pay

## 2021-05-11 ENCOUNTER — Observation Stay (HOSPITAL_COMMUNITY)
Admission: EM | Admit: 2021-05-11 | Discharge: 2021-05-12 | Disposition: A | Discharge status: Home / Self Care | Payer: Medicare Other | Attending: Family Medicine | Admitting: Family Medicine

## 2021-05-11 ENCOUNTER — Other Ambulatory Visit (HOSPITAL_COMMUNITY): Payer: Self-pay

## 2021-05-11 ENCOUNTER — Emergency Department (HOSPITAL_COMMUNITY): Payer: Medicare Other

## 2021-05-11 ENCOUNTER — Encounter (HOSPITAL_COMMUNITY): Payer: Self-pay

## 2021-05-11 DIAGNOSIS — G4733 Obstructive sleep apnea (adult) (pediatric): Secondary | ICD-10-CM | POA: Diagnosis present

## 2021-05-11 DIAGNOSIS — N179 Acute kidney failure, unspecified: Secondary | ICD-10-CM | POA: Diagnosis not present

## 2021-05-11 DIAGNOSIS — E119 Type 2 diabetes mellitus without complications: Secondary | ICD-10-CM

## 2021-05-11 DIAGNOSIS — Z79899 Other long term (current) drug therapy: Secondary | ICD-10-CM | POA: Diagnosis not present

## 2021-05-11 DIAGNOSIS — G4739 Other sleep apnea: Secondary | ICD-10-CM | POA: Insufficient documentation

## 2021-05-11 DIAGNOSIS — D509 Iron deficiency anemia, unspecified: Secondary | ICD-10-CM | POA: Diagnosis present

## 2021-05-11 DIAGNOSIS — E876 Hypokalemia: Secondary | ICD-10-CM | POA: Insufficient documentation

## 2021-05-11 DIAGNOSIS — Z8673 Personal history of transient ischemic attack (TIA), and cerebral infarction without residual deficits: Secondary | ICD-10-CM | POA: Diagnosis not present

## 2021-05-11 DIAGNOSIS — N3941 Urge incontinence: Secondary | ICD-10-CM | POA: Insufficient documentation

## 2021-05-11 DIAGNOSIS — Z7984 Long term (current) use of oral hypoglycemic drugs: Secondary | ICD-10-CM | POA: Insufficient documentation

## 2021-05-11 DIAGNOSIS — I6389 Other cerebral infarction: Secondary | ICD-10-CM | POA: Diagnosis not present

## 2021-05-11 DIAGNOSIS — F32A Depression, unspecified: Secondary | ICD-10-CM | POA: Diagnosis present

## 2021-05-11 DIAGNOSIS — H538 Other visual disturbances: Secondary | ICD-10-CM | POA: Diagnosis present

## 2021-05-11 DIAGNOSIS — E785 Hyperlipidemia, unspecified: Secondary | ICD-10-CM | POA: Diagnosis not present

## 2021-05-11 DIAGNOSIS — I639 Cerebral infarction, unspecified: Principal | ICD-10-CM | POA: Insufficient documentation

## 2021-05-11 DIAGNOSIS — I10 Essential (primary) hypertension: Secondary | ICD-10-CM | POA: Diagnosis present

## 2021-05-11 DIAGNOSIS — G9389 Other specified disorders of brain: Secondary | ICD-10-CM

## 2021-05-11 DIAGNOSIS — Z20822 Contact with and (suspected) exposure to covid-19: Secondary | ICD-10-CM | POA: Insufficient documentation

## 2021-05-11 DIAGNOSIS — F339 Major depressive disorder, recurrent, unspecified: Secondary | ICD-10-CM | POA: Insufficient documentation

## 2021-05-11 DIAGNOSIS — D649 Anemia, unspecified: Secondary | ICD-10-CM | POA: Insufficient documentation

## 2021-05-11 DIAGNOSIS — Z7982 Long term (current) use of aspirin: Secondary | ICD-10-CM | POA: Insufficient documentation

## 2021-05-11 DIAGNOSIS — R519 Headache, unspecified: Secondary | ICD-10-CM | POA: Diagnosis not present

## 2021-05-11 DIAGNOSIS — E039 Hypothyroidism, unspecified: Secondary | ICD-10-CM | POA: Insufficient documentation

## 2021-05-11 LAB — PROTIME-INR
INR: 1 (ref 0.8–1.2)
Prothrombin Time: 12.9 seconds (ref 11.4–15.2)

## 2021-05-11 LAB — CBC
HCT: 34.7 % — ABNORMAL LOW (ref 36.0–46.0)
Hemoglobin: 9.9 g/dL — ABNORMAL LOW (ref 12.0–15.0)
MCH: 22.7 pg — ABNORMAL LOW (ref 26.0–34.0)
MCHC: 28.5 g/dL — ABNORMAL LOW (ref 30.0–36.0)
MCV: 79.4 fL — ABNORMAL LOW (ref 80.0–100.0)
Platelets: 305 10*3/uL (ref 150–400)
RBC: 4.37 MIL/uL (ref 3.87–5.11)
RDW: 18.7 % — ABNORMAL HIGH (ref 11.5–15.5)
WBC: 7.3 10*3/uL (ref 4.0–10.5)
nRBC: 0 % (ref 0.0–0.2)

## 2021-05-11 LAB — RAPID URINE DRUG SCREEN, HOSP PERFORMED
Amphetamines: NOT DETECTED
Barbiturates: NOT DETECTED
Benzodiazepines: NOT DETECTED
Cocaine: NOT DETECTED
Opiates: NOT DETECTED
Tetrahydrocannabinol: NOT DETECTED

## 2021-05-11 LAB — COMPREHENSIVE METABOLIC PANEL
ALT: 21 U/L (ref 0–44)
AST: 31 U/L (ref 15–41)
Albumin: 3.6 g/dL (ref 3.5–5.0)
Alkaline Phosphatase: 97 U/L (ref 38–126)
Anion gap: 12 (ref 5–15)
BUN: 16 mg/dL (ref 8–23)
CO2: 21 mmol/L — ABNORMAL LOW (ref 22–32)
Calcium: 8.3 mg/dL — ABNORMAL LOW (ref 8.9–10.3)
Chloride: 107 mmol/L (ref 98–111)
Creatinine, Ser: 1.07 mg/dL — ABNORMAL HIGH (ref 0.44–1.00)
GFR, Estimated: 58 mL/min — ABNORMAL LOW (ref >60)
Glucose, Bld: 125 mg/dL — ABNORMAL HIGH (ref 70–99)
Potassium: 3.2 mmol/L — ABNORMAL LOW (ref 3.5–5.1)
Sodium: 140 mmol/L (ref 135–145)
Total Bilirubin: 0.6 mg/dL (ref 0.3–1.2)
Total Protein: 7.5 g/dL (ref 6.5–8.1)

## 2021-05-11 LAB — RESP PANEL BY RT-PCR (FLU A&B, COVID) ARPGX2
Influenza A by PCR: NEGATIVE
Influenza B by PCR: NEGATIVE
SARS Coronavirus 2 by RT PCR: NEGATIVE

## 2021-05-11 LAB — DIFFERENTIAL
Abs Immature Granulocytes: 0.02 10*3/uL (ref 0.00–0.07)
Basophils Absolute: 0 10*3/uL (ref 0.0–0.1)
Basophils Relative: 0 %
Eosinophils Absolute: 0.1 10*3/uL (ref 0.0–0.5)
Eosinophils Relative: 1 %
Immature Granulocytes: 0 %
Lymphocytes Relative: 30 %
Lymphs Abs: 2.2 10*3/uL (ref 0.7–4.0)
Monocytes Absolute: 0.7 10*3/uL (ref 0.1–1.0)
Monocytes Relative: 10 %
Neutro Abs: 4.3 10*3/uL (ref 1.7–7.7)
Neutrophils Relative %: 59 %

## 2021-05-11 LAB — ETHANOL: Alcohol, Ethyl (B): <10 mg/dL (ref <10)

## 2021-05-11 LAB — APTT: aPTT: 29 seconds (ref 24–36)

## 2021-05-11 MED ORDER — POTASSIUM CHLORIDE CRYS ER 20 MEQ PO TBCR
40.0000 meq | EXTENDED_RELEASE_TABLET | Freq: Once | ORAL | Status: AC
  Administered 2021-05-11: 40 meq via ORAL
  Filled 2021-05-11: qty 2

## 2021-05-11 MED ORDER — CITALOPRAM HYDROBROMIDE 10 MG PO TABS
20.0000 mg | ORAL_TABLET | Freq: Every evening | ORAL | Status: DC
  Administered 2021-05-11 – 2021-05-12 (×2): 20 mg via ORAL
  Filled 2021-05-11 (×3): qty 2

## 2021-05-11 MED ORDER — ASPIRIN EC 81 MG PO TBEC
81.0000 mg | DELAYED_RELEASE_TABLET | Freq: Every day | ORAL | Status: DC
  Administered 2021-05-11 – 2021-05-12 (×2): 81 mg via ORAL
  Filled 2021-05-11 (×2): qty 1

## 2021-05-11 MED ORDER — ACETAMINOPHEN 325 MG PO TABS: 650.0000 mg | ORAL_TABLET | Freq: Four times a day (QID) | ORAL | Status: DC | PRN

## 2021-05-11 MED ORDER — ATORVASTATIN CALCIUM 80 MG PO TABS
80.0000 mg | ORAL_TABLET | Freq: Every day | ORAL | Status: DC
  Administered 2021-05-11 – 2021-05-12 (×2): 80 mg via ORAL
  Filled 2021-05-11: qty 1
  Filled 2021-05-11: qty 2

## 2021-05-11 MED ORDER — METFORMIN HCL 500 MG PO TABS: 500.0000 mg | ORAL_TABLET | Freq: Two times a day (BID) | ORAL | Status: DC

## 2021-05-11 MED ORDER — FERROUS SULFATE 325 (65 FE) MG PO TABS: 325.0000 mg | ORAL_TABLET | ORAL | Status: DC

## 2021-05-11 MED ORDER — POLYETHYLENE GLYCOL 3350 17 G PO PACK: 17.0000 g | PACK | Freq: Every day | ORAL | Status: DC | PRN

## 2021-05-11 MED ORDER — ATORVASTATIN CALCIUM 40 MG PO TABS: 80.0000 mg | ORAL_TABLET | Freq: Every day | ORAL | Status: DC

## 2021-05-11 MED ORDER — LEVOTHYROXINE SODIUM 25 MCG PO TABS
125.0000 ug | ORAL_TABLET | Freq: Every day | ORAL | Status: DC
  Administered 2021-05-12: 125 ug via ORAL
  Filled 2021-05-11: qty 1

## 2021-05-11 MED ORDER — FERROUS SULFATE 325 (65 FE) MG PO TABS
325.0000 mg | ORAL_TABLET | ORAL | Status: DC
  Administered 2021-05-12: 325 mg via ORAL
  Filled 2021-05-11: qty 1

## 2021-05-11 MED ORDER — HYDROCHLOROTHIAZIDE 12.5 MG PO CAPS
12.5000 mg | ORAL_CAPSULE | Freq: Every day | ORAL | Status: DC
  Administered 2021-05-12: 12.5 mg via ORAL
  Filled 2021-05-11: qty 1

## 2021-05-11 MED ORDER — ASPIRIN EC 81 MG PO TBEC: 81.0000 mg | DELAYED_RELEASE_TABLET | Freq: Every day | ORAL | Status: DC

## 2021-05-11 MED ORDER — ENOXAPARIN SODIUM 60 MG/0.6ML IJ SOSY
60.0000 mg | PREFILLED_SYRINGE | INTRAMUSCULAR | Status: DC
  Administered 2021-05-11: 60 mg via SUBCUTANEOUS
  Filled 2021-05-11: qty 0.6

## 2021-05-11 MED ORDER — STROKE: EARLY STAGES OF RECOVERY BOOK
Freq: Once | Status: AC
  Filled 2021-05-11: qty 1

## 2021-05-11 NOTE — ED Triage Notes (Signed)
Pt c.o vision changes for the past month, started out as double vision and now its blurred vision. Pt had outpatient MRI done (brought CD) and eye doctor called her today and told her to come to ED due to possible stroke. C.o temporal headache at this time, denies dizziness or nausea.

## 2021-05-11 NOTE — Hospital Course (Addendum)
Morgan Collins is a 65 y.o. female who was admitted to [facility] on 11/25 for double vision and blurry vision for the last months, found to have an acute/subacute right frontoparietal CVA.  Below is her hospital course listed by problem.  Refer to the H&P for additional information.  Acute/Subacute Right Frontal Parietal CVA  She was sent to the ED by her ophthalmologist.  Had an outpatient MRI on 11/23 which showed acute frontal parietal stroke and she was instructed to present to the ED.  Code stroke was not activated in the ED given the subacute timeline. in the ED, CT head was negative for acute infarct.  Neurology consulted and further workup demonstrated CTA head and neck demonstrated***. Echo ***. PT/OT/SLP were consulted and recommended ***. She was continued on single anti-platelet therapy with ASA and continued on statin (LDL ***).   Type 2 DM  Hemoglobin A1c 7.1.       Other conditions chronic and stable.  PCP Issues for Follow-Up BP follow up.  Normocytic anemia Urinary urgency incontinence Sleep study  Cardiac event monitor 30-d

## 2021-05-11 NOTE — Consult Note (Addendum)
Neurology Consultation  Reason for Consult: stroke on MRI brain Referring Physician: Dr. Karle Starch  CC: double vision and blurry vision for the last month  History is obtained from:patient and medical chart  HPI: Morgan Collins is a 65 y.o. female with past medical history of HTN, HLD, ICH in 2015, DM @, hypothyroidism and OSA who presents to the ED as requested by her ophthalmologist. She states she has been having vision problems for the last month, soon after her sleep study, first she had double vision (one on top of the other), then trouble with reading, now having blurry vision and problems with depth perception. She states she mostly is having trouble at night when driving, but these symptoms are not constant as they come and go. She denies any numbness or tingling, weakness, droop or slurred speech. Patient states she went to her ophthalmologist who ordered an MRI brain study and called today to tell her she had a a stroke and needed to come to the ED. Neurology called for consult.    LKW: last month  tpa given?: no, outside of window Premorbid modified Rankin scale (mRS):  1-No significant post stroke disability and can perform usual duties with stroke symptoms   ROS: Full ROS was performed and is negative except as noted in the HPI.    Past Medical History:  Diagnosis Date   Complication of anesthesia    encephalitis in coma 23 days- trach -reversed-no issues now   Depressed    Diabetes mellitus without complication (HCC)    type 2   High cholesterol    Hypertension    Hypothyroidism    Sleep apnea    do not use cpap   Stroke ([place]) 2015   slower ,but not a problem; right thalamic hemorrhage 07/2013   Thyroid disease      Essential (primary) hypertension, Type 2 diabetes mellitus with hyperglycemia , and Morbid Obesity(BMI > 40)   Family History  Problem Relation Age of Onset   Congestive Heart Failure Mother    Hypertension Mother    Alcohol abuse Father     Cancer Sister        Breast   Dementia Brother      Social History:   reports that she has never smoked. She has never used smokeless tobacco. She reports that she does not drink alcohol and does not use drugs.  Medications No current facility-administered medications for this encounter.  Current Outpatient Medications:    aspirin EC 81 MG tablet, Take 1 tablet (81 mg total) by mouth daily., Disp: 90 tablet, Rfl: 3   atorvastatin (LIPITOR) 80 MG tablet, Take 1 tablet (80 mg total) by mouth daily., Disp: 90 tablet, Rfl: 1   citalopram (CELEXA) 40 MG tablet, Take 0.5 tablets (20 mg total) by mouth every evening., Disp: 60 tablet, Rfl: 2   Coenzyme Q10 (MEGA COQ10 PO), Take 2 capsules by mouth daily., Disp: , Rfl:    Cyanocobalamin (B-12 PO), Take 1 tablet by mouth daily., Disp: , Rfl:    ECHINACEA PO, Take 1 capsule by mouth daily., Disp: , Rfl:    hydrochlorothiazide (MICROZIDE) 12.5 MG capsule, Take 1 capsule (12.5 mg total) by mouth daily., Disp: 30 capsule, Rfl: 2   levothyroxine (SYNTHROID) 125 MCG tablet, Take 125 mcg by mouth daily., Disp: , Rfl:    losartan (COZAAR) 100 MG tablet, Take 1 tablet (100 mg total) by mouth daily., Disp: 90 tablet, Rfl: 3   metFORMIN (GLUCOPHAGE) 500 MG tablet,  Take 1 tablet (500 mg total) by mouth 2 (two) times daily with a meal., Disp: 180 tablet, Rfl: 3   Dulaglutide (TRULICITY) 3.41 PF/7.9KW SOPN, Inject 0.75 mg into the skin once a week. (Patient not taking: Reported on 05/11/2021), Disp: 2 mL, Rfl: 0   ferrous sulfate (FERROUSUL) 325 (65 FE) MG tablet, Take 1 tablet (325 mg total) by mouth daily with breakfast. (Patient not taking: Reported on 05/11/2021), Disp: 30 tablet, Rfl: 2   levothyroxine (SYNTHROID) 112 MCG tablet, Take 1 tablet (112 mcg total) by mouth every morning. 30 minutes before food (Patient not taking: Reported on 05/11/2021), Disp: 90 tablet, Rfl: 3   Exam: Current vital signs: BP (!) 166/109   Pulse 71   Temp 98.6 F (37 C)    Resp (!) 24   Ht 5\' 1"  (1.549 m)   Wt 115.7 kg   SpO2 100%   BMI 48.18 kg/m  Vital signs in last 24 hours: Temp:  [98.6 F (37 C)] 98.6 F (37 C) (11/25 1249) Pulse Rate:  [70-79] 71 (11/25 1800) Resp:  [14-24] 24 (11/25 1800) BP: (149-172)/(93-109) 166/109 (11/25 1800) SpO2:  [100 %] 100 % (11/25 1800) Weight:  [115.7 kg] 115.7 kg (11/25 1259)  GENERAL: Awake, alert in NAD HEENT: - Normocephalic and atraumatic, dry mm LUNGS - Clear to auscultation bilaterally with no wheezes CV - S1S2 RRR, no m/r/g, equal pulses bilaterally. ABDOMEN - Soft, nontender, nondistended with normoactive BS Ext: warm, well perfused, intact peripheral pulses, no edema  NEURO:  Mental Status: AA&Ox3  Language: speech is clear.  Naming, repetition, fluency, and comprehension intact. Cranial Nerves: PERRL 72mm/brisk. EOMI, visual fields full, no facial asymmetry, facial sensation intact, hearing intact, tongue/uvula/soft palate midline, normal sternocleidomastoid and trapezius muscle strength. No evidence of tongue atrophy or fibrillations Motor: 5/5 in all 4 extremities Tone: is normal and bulk is normal Sensation- Intact to light touch bilaterally Coordination: FTN intact bilaterally, no ataxia in BLE. Gait- deferred  NIHSS 1a Level of Conscious.: 0 1b LOC Questions: 0 1c LOC Commands: 0 2 Best Gaze: 0 3 Visual: 0 4 Facial Palsy: 0 5a Motor Arm - left: 0 5b Motor Arm - Right: 0 6a Motor Leg - Left: 0 6b Motor Leg - Right: 0 7 Limb Ataxia: 0 8 Sensory: 0 9 Best Language: 0 10 Dysarthria: 0 11 Extinct. and Inatten.: 0 TOTAL: 0    CBC    Component Value Date/Time   WBC 7.3 05/11/2021 1325   RBC 4.37 05/11/2021 1325   HGB 9.9 (L) 05/11/2021 1325   HGB 10.2 (L) 03/05/2021 1509   HCT 34.7 (L) 05/11/2021 1325   HCT 34.0 03/05/2021 1509   PLT 305 05/11/2021 1325   PLT 350 03/05/2021 1509   MCV 79.4 (L) 05/11/2021 1325   MCV 72 (L) 03/05/2021 1509   MCH 22.7 (L) 05/11/2021 1325    MCHC 28.5 (L) 05/11/2021 1325   RDW 18.7 (H) 05/11/2021 1325   RDW 17.3 (H) 03/05/2021 1509   LYMPHSABS 2.2 05/11/2021 1325   MONOABS 0.7 05/11/2021 1325   EOSABS 0.1 05/11/2021 1325   BASOSABS 0.0 05/11/2021 1325    CMP     Component Value Date/Time   NA 140 05/11/2021 1325   NA 139 03/30/2021 1215   K 3.2 (L) 05/11/2021 1325   CL 107 05/11/2021 1325   CO2 21 (L) 05/11/2021 1325   GLUCOSE 125 (H) 05/11/2021 1325   BUN 16 05/11/2021 1325   BUN 17 03/30/2021 1215  CREATININE 1.07 (H) 05/11/2021 1325   CREATININE 1.08 (H) 04/21/2015 1653   CALCIUM 8.3 (L) 05/11/2021 1325   PROT 7.5 05/11/2021 1325   PROT 7.2 03/05/2021 1509   ALBUMIN 3.6 05/11/2021 1325   ALBUMIN 4.1 03/05/2021 1509   AST 31 05/11/2021 1325   ALT 21 05/11/2021 1325   ALKPHOS 97 05/11/2021 1325   BILITOT 0.6 05/11/2021 1325   BILITOT 0.2 03/05/2021 1509   GFRNONAA 58 (L) 05/11/2021 1325   GFRAA 106 12/28/2019 1726    Lipid Panel     Component Value Date/Time   CHOL 167 12/28/2019 1726   TRIG 74 12/28/2019 1726   HDL 45 12/28/2019 1726   CHOLHDL 3.7 12/28/2019 1726   CHOLHDL 3.6 07/26/2014 1602   VLDL 22 07/26/2014 1602   LDLCALC 108 (H) 12/28/2019 1726     Imaging Unable to review MRI Aaron Edelman as we do not have access to images.  MRI examination of the brain 11/23 @ [place] (in care everywhere) 1.  Small acute/subacute infarction in the white matter of the RIGHT frontal parietal region.  2.  Small chronic lacunar infarction in the RIGHT centrum semiovale ovale and mild leukocytosis, most likely due to chronic microvascular disease.  3.  Sequela from chronic images in the mesial RIGHT thalamus and LEFT occipital lobe.  4.  Encephalomalacia in the bilateral inferior cerebellum  Assessment:  Morgan Collins is a 65 y.o. female with past medical history of HTN, HLD, ICH in 2015, DM @, hypothyroidism and OSA who presents to the ED as requested by her ophthalmologist. She states she has been  having vision problems for the last month, soon after her sleep study, first she had double vision (one on top of the other), then trouble with reading, now having blurry vision and problems with depth perception.  Acute/subacute right frontal parietal infarct   Recommendations: - Medicine to admit  - HgbA1c, fasting lipid panel - Frequent neuro checks - Echocardiogram - CTA head and neck - Prophylactic therapy-Antiplatelet med: Aspirin - dose 81mg   - Risk factor modification - Telemetry monitoring - PT consult, OT consult, Speech consult - Continue Liptor 80mg   - Stroke team to follow   Beulah Gandy, NP

## 2021-05-11 NOTE — ED Notes (Signed)
Notified provider of elevated BP and pt is reporting 3/10 headache.

## 2021-05-11 NOTE — ED Notes (Signed)
Notified provider of pt's elevated BP. No neuro changes identified. Pt ambulatory to RR with no difficulty.

## 2021-05-11 NOTE — ED Notes (Signed)
Provided pt with a sandwich, apple sauce, graham crackers, and peanut butter per MD approval.

## 2021-05-11 NOTE — ED Provider Notes (Signed)
[facility] EMERGENCY DEPARTMENT Provider Note   CSN: [id number] Arrival date & time: 05/11/21  1245     History Chief Complaint  Patient presents with   Eye Problem    Vision changes    Morgan Collins is a 65 y.o. female.  HPI  Patient is a 65 year old female with a history of hyperlipidemia, hypertension, hypothyroidism, CVA, diabetes mellitus, who presents to the emergency department at the request of her ophthalmologist (Dr. Katy Fitch) due to a recent stroke.  Patient had an MRI of the brain with and without contrast on November 23 with findings as noted below:  IMPRESSION:  1.  Small acute/subacute infarction in the white matter of the RIGHT frontal parietal region.  2.  Small chronic lacunar infarction in the RIGHT centrum semiovale ovale and mild leukocytosis, most likely due to chronic microvascular disease.  3.  Sequela from chronic images in the mesial RIGHT thalamus and LEFT occipital lobe.  4.  Encephalomalacia in the bilateral inferior cerebellum.   Patient states that she has been experiencing double vision for about 2 weeks.  States that it occurs when both eyes are open and will resolve when closing either eye.  Reports tingling and numbness in the hands that is spontaneous and patient believes it is positional.  No other tingling or numbness.  Patient also notes that since the onset of her double vision she is having issues with depth perception and when using a pen she will have to use the fingers on her other hand to help guide where the pen will be located because she cannot tell on the paper.  Since coming to the emergency department she also notes a right-sided headache.  No chest pain, abdominal pain, shortness of breath, nausea, vomiting.  Patient notes a prior CVA in 2015 and denies any chronic deficits from this.     Past Medical History:  Diagnosis Date   Complication of anesthesia    encephalitis in coma 23 days- trach -reversed-no  issues now   Depressed    Diabetes mellitus without complication ([place])    type 2   High cholesterol    Hypertension    Hypothyroidism    Sleep apnea    do not use cpap   Stroke ([place]) 2015   slower ,but not a problem; right thalamic hemorrhage 07/2013   Thyroid disease     Patient Active Problem List   Diagnosis Date Noted   Iron deficiency anemia 02/23/2020   Urinary incontinence 12/28/2019   Leg swelling 09/14/2019   Carpal tunnel syndrome of right wrist 04/10/2018   History of colonic polyps 04/10/2018   OSA (obstructive sleep apnea) 09/19/2015   Type 2 diabetes mellitus, controlled ([place]) 03/23/2015   Hyperparathyroidism, primary ([place]) 02/09/2015   Vitamin D deficiency 02/09/2015   Osteoporosis 12/28/2014   Depression 10/25/2014   Nontraumatic subcortical hemorrhage of cerebral hemisphere ([place]) 03/08/2014   HLD (hyperlipidemia) 03/08/2014   Essential hypertension 03/08/2014   Obesities, morbid ([place]) 11/22/2013   Hypothyroidism 08/18/2013   Thalamic hemorrhage ([place]) 08/04/2013   Stroke ([place]) 07/27/2013   Obstructive hydrocephalus ([place]) 07/27/2013    Past Surgical History:  Procedure Laterality Date   ABDOMINAL HYSTERECTOMY     APPENDECTOMY     c sections     COLONOSCOPY WITH PROPOFOL N/A 12/30/2014   Procedure: COLONOSCOPY WITH PROPOFOL;  Surgeon: Carol Ada, MD;  Location: [facility];  Service: Endoscopy;  Laterality: N/A;   COLONOSCOPY WITH PROPOFOL N/A 07/03/2018   Procedure: COLONOSCOPY  WITH PROPOFOL;  Surgeon: Carol Ada, MD;  Location: [facility];  Service: Endoscopy;  Laterality: N/A;   PARATHYROIDECTOMY Right 02/18/2017   Procedure: RIGHT INFERIOR PARATHYROIDECTOMY;  Surgeon: Armandina Gemma, MD;  Location: [place];  Service: General;  Laterality: Right;   POLYPECTOMY  07/03/2018   Procedure: POLYPECTOMY;  Surgeon: Carol Ada, MD;  Location: [facility];  Service: Endoscopy;;   TRACHEOSTOMY     past history of early 20's- encephalitis coma for 23  days-1982     OB History   No obstetric history on file.     Family History  Problem Relation Age of Onset   Congestive Heart Failure Mother    Hypertension Mother    Alcohol abuse Father    Cancer Sister        Breast   Dementia Brother     Social History   Tobacco Use   Smoking status: Never   Smokeless tobacco: Never  Vaping Use   Vaping Use: Never used  Substance Use Topics   Alcohol use: No   Drug use: No    Home Medications Prior to Admission medications   Medication Sig Start Date End Date Taking? Authorizing Provider  aspirin EC 81 MG tablet Take 1 tablet (81 mg total) by mouth daily. 02/23/20  Yes [name], MD  atorvastatin (LIPITOR) 80 MG tablet Take 1 tablet (80 mg total) by mouth daily. 01/31/21  Yes Alen Bleacher, MD  citalopram (CELEXA) 40 MG tablet Take 0.5 tablets (20 mg total) by mouth every evening. 01/31/21  Yes Alen Bleacher, MD  Coenzyme Q10 (MEGA COQ10 PO) Take 2 capsules by mouth daily.   Yes [provider]  Cyanocobalamin (B-12 PO) Take 1 tablet by mouth daily.   Yes [provider]  ECHINACEA PO Take 1 capsule by mouth daily.   Yes [provider]  hydrochlorothiazide (MICROZIDE) 12.5 MG capsule Take 1 capsule (12.5 mg total) by mouth daily. 03/05/21 06/03/21 Yes Alen Bleacher, MD  levothyroxine (SYNTHROID) 125 MCG tablet Take 125 mcg by mouth daily. 04/28/21  Yes [provider]  losartan (COZAAR) 100 MG tablet Take 1 tablet (100 mg total) by mouth daily. 01/31/21  Yes Alen Bleacher, MD  metFORMIN (GLUCOPHAGE) 500 MG tablet Take 1 tablet (500 mg total) by mouth 2 (two) times daily with a meal. 01/31/21  Yes Alen Bleacher, MD  Dulaglutide (TRULICITY) 6.04 VW/0.9WJ SOPN Inject 0.75 mg into the skin once a week. Patient not taking: Reported on 05/11/2021 03/05/21   Alen Bleacher, MD  ferrous sulfate (FERROUSUL) 325 (65 FE) MG tablet Take 1 tablet (325 mg total) by mouth daily with breakfast. Patient not taking:  Reported on 05/11/2021 02/02/21 05/03/21  Alen Bleacher, MD  levothyroxine (SYNTHROID) 112 MCG tablet Take 1 tablet (112 mcg total) by mouth every morning. 30 minutes before food Patient not taking: Reported on 05/11/2021 03/07/21   Alen Bleacher, MD    Allergies    Patient has no known allergies.  Review of Systems   Review of Systems  All other systems reviewed and are negative. Ten systems reviewed and are negative for acute change, except as noted in the HPI.   Physical Exam Updated Vital Signs BP (!) 163/100   Pulse 71   Temp 98.6 F (37 C)   Resp 20   Ht 5\' 1"  (1.549 m)   Wt 115.7 kg   SpO2 98%   BMI 48.18 kg/m   Physical Exam Vitals and nursing note reviewed.  Constitutional:  General: She is not in acute distress.    Appearance: Normal appearance. She is not ill-appearing, toxic-appearing or diaphoretic.  HENT:     Head: Normocephalic and atraumatic.     Right Ear: External ear normal.     Left Ear: External ear normal.     Nose: Nose normal.     Mouth/Throat:     Mouth: Mucous membranes are moist.     Pharynx: Oropharynx is clear. No oropharyngeal exudate or posterior oropharyngeal erythema.  Eyes:     General: No scleral icterus.       Right eye: No discharge.        Left eye: No discharge.     Extraocular Movements: Extraocular movements intact.     Conjunctiva/sclera: Conjunctivae normal.     Pupils: Pupils are equal, round, and reactive to light.     Comments: PERRL. EOMI. visual fields grossly intact in both eyes.  Cardiovascular:     Rate and Rhythm: Normal rate and regular rhythm.     Pulses: Normal pulses.     Heart sounds: Normal heart sounds. No murmur heard.   No friction rub. No gallop.  Pulmonary:     Effort: Pulmonary effort is normal. No respiratory distress.     Breath sounds: Normal breath sounds. No stridor. No wheezing, rhonchi or rales.  Abdominal:     General: Abdomen is flat.     Tenderness: There is no abdominal tenderness.   Musculoskeletal:        General: Normal range of motion.     Cervical back: Normal range of motion and neck supple. No tenderness.  Skin:    General: Skin is warm and dry.  Neurological:     General: No focal deficit present.     Mental Status: She is alert and oriented to person, place, and time.     Comments: Patient is oriented to person, place, and time. Patient phonates in clear, complete, and coherent sentences. Finger to nose intact bilaterally with no visible signs of dysmetria. Strength is 5/5 in all four extremities. Distal sensation intact in all four extremities.  Psychiatric:        Mood and Affect: Mood normal.        Behavior: Behavior normal.   ED Results / Procedures / Treatments   Labs (all labs ordered are listed, but only abnormal results are displayed) Labs Reviewed  CBC - Abnormal; Notable for the following components:      Result Value   Hemoglobin 9.9 (*)    HCT 34.7 (*)    MCV 79.4 (*)    MCH 22.7 (*)    MCHC 28.5 (*)    RDW 18.7 (*)    All other components within normal limits  COMPREHENSIVE METABOLIC PANEL - Abnormal; Notable for the following components:   Potassium 3.2 (*)    CO2 21 (*)    Glucose, Bld 125 (*)    Creatinine, Ser 1.07 (*)    Calcium 8.3 (*)    GFR, Estimated 58 (*)    All other components within normal limits  RESP PANEL BY RT-PCR (FLU A&B, COVID) ARPGX2  ETHANOL  PROTIME-INR  APTT  DIFFERENTIAL  RAPID URINE DRUG SCREEN, HOSP PERFORMED  HEMOGLOBIN A1C  LIPID PANEL    EKG None  Radiology CT HEAD WO CONTRAST  Result Date: 05/11/2021 CLINICAL DATA:  Severe acute headache EXAM: CT HEAD WITHOUT CONTRAST TECHNIQUE: Contiguous axial images were obtained from the base of the skull through the vertex without  intravenous contrast. COMPARISON:  08/03/2013 FINDINGS: Brain: Patchy white matter microvascular ischemic changes throughout both cerebral hemispheres. No acute intracranial hemorrhage, new mass lesion, new infarction,  midline shift, herniation, hydrocephalus, or extra-axial fluid collection. No focal mass effect or edema. Cisterns are patent. No cerebellar abnormality. Vascular: No hyperdense vessel or unexpected calcification. Skull: Normal. Negative for fracture or focal lesion. Sinuses/Orbits: No acute finding. Other: None. IMPRESSION: Chronic white matter microvascular changes. No acute intracranial abnormality by noncontrast CT. Electronically Signed   By: Jerilynn Mages.  Shick M.D.   On: 05/11/2021 14:56    Procedures Procedures   Medications Ordered in ED Medications  atorvastatin (LIPITOR) tablet 80 mg (has no administration in time range)  aspirin EC tablet 81 mg (has no administration in time range)  potassium chloride SA (KLOR-CON) CR tablet 40 mEq (40 mEq Oral Given 05/11/21 1851)   stroke: mapping our early stages of recovery book ( Does not apply Given 05/11/21 1851)    ED Course  I have reviewed the triage vital signs and the nursing notes.  Pertinent labs & imaging results that were available during my care of the patient were reviewed by me and considered in my medical decision making (see chart for details).    MDM Rules/Calculators/A&P                          Pt is a 65 y.o. female who presents to the emergency department due to binocular diplopia.  MRI obtained by her ophthalmologist 2 days ago concerning for a right parietal infarct.  Labs: CBC, CMP, ethanol, PT/INR, APTT, UDS, respiratory panel with abnormalities as noted above.  Imaging: CT scan of the head without contrast shows chronic white matter microvascular changes.  No acute intracranial abnormality by noncontrast CT. CTA of the head and neck are pending.  I, Rayna Sexton, PA-C, personally reviewed and evaluated these images and lab results as part of my medical decision-making.  Patient discussed with and evaluated by her neurology team.  Please see their recommendations for further information.  They do recommend admission  to medicine.  We will discuss with the medicine team at this time.  Note: Portions of this report may have been transcribed using voice recognition software. Every effort was made to ensure accuracy; however, inadvertent computerized transcription errors may be present.   Final Clinical Impression(s) / ED Diagnoses Final diagnoses:  Cerebrovascular accident (CVA), unspecified mechanism [facility])   Rx / DC Orders ED Discharge Orders     None        Rayna Sexton, PA-C 05/11/21 1943    Truddie Hidden, MD 05/11/21 2230

## 2021-05-11 NOTE — ED Provider Notes (Addendum)
Emergency Medicine Provider Triage Evaluation Note  Morgan Collins , a 65 y.o. female  was evaluated in triage.  Pt who presents per ophthalmologist for acute frontal parietal stroke identified on outpatient MRI of the brain on 05/09/2021 which was obtained due to patient experiencing blurry vision and right upper extremity numbness and tingling.  Endorses frontal headache worse on the right side today. She is not anticoagulated Review of Systems  Positive: Headache, blurry vision, double vision, right upper extremity numbness and tingling Negative: Weakness, chest pain, palpitations.  Physical Exam  BP (!) 166/98 (BP Location: Right Arm)   Pulse 79   Temp 98.6 F (37 C)   Resp 18   Ht 5\' 1"  (1.549 m)   Wt 115.7 kg   SpO2 100%   BMI 48.18 kg/m  Gen:   Awake, no distress   Resp:  Normal effort  MSK:   Moves extremities without difficulty  Other:  PERRL, EOMI, cranial nerves are intact.  Symmetric grip strength bilaterally.  Vision appears grossly intact.  Medical Decision Making  Medically screening exam initiated at 1:32 PM.  Appropriate orders placed.  Veleta Miners was informed that the remainder of the evaluation will be completed by another provider, this initial triage assessment does not replace that evaluation, and the importance of remaining in the ED until their evaluation is complete.  We will proceed with stroke laboratory studies and CT scan given new frontal headache with recent stroke.  Code stroke not activated.  MRI results visible in care everywhere, patient with disc with images.  This chart was dictated using voice recognition software, Dragon. Despite the best efforts of this provider to proofread and correct errors, errors may still occur which can change documentation meaning.    Emeline Darling, PA-C 05/11/21 1333    Anjalee Cope, Gypsy Balsam, PA-C 05/11/21 1334    Jeanell Sparrow, DO 05/12/21 1616

## 2021-05-11 NOTE — H&P (Addendum)
[facility] Admission History and Physical Service Pager: [phone]  Patient name: Morgan Collins Medical record number: [id number] Date of birth: 10/04/1955 Age: 65 y.o. Gender: female  Primary Care Provider: Alen Bleacher, MD Consultants: Neurology Code Status: Full Code  Preferred Emergency Contact: Surie Suchocki (daughter) Mobile Phone: [phone]  Chief Complaint: Double and blurred vision  Assessment and Plan: Morgan Collins is a 65 y.o. female presenting with double and blurry vision, found to have acute/subacute fronto-parietal infarct noted on MRI (11/23). PMHx is significant for CAD (hx of CVA 2015), HLD, HTN, T2DM  Right fronto-parietal infarct  HLD Patient presented to ophthalmologist (Dr. Shanon Rosser) with complaints of double vision, received an MRI on 11/23, was found to have a right frontal parietal infarct, and then sent to the ED for CVA work-up.  Patient describes vertical and intermittent double vision has been present for about 1 month, with associated behavior change that patient and daughter described as " snappy and emotional" and intermittent and transient paresthesia in her fingers bilaterally R>L.  She denied dizziness, syncopal episodes, worsening vision, weakness, confusion, scanning speech, increased clumsiness, difficulty ambulating, or dysphagia. Patient has been seen by neuro in the ED. CT Head negative. She was started on single-anti-platelet therapy with aspirin. Neuro examination without abnormalities. Given patient has been symptomatic for 1 month and MRI 2 days prior, low likelihood of autonomic dysregulation requiring permissive hypertension. Will slowly normalize her BP by adding her HCTZ. Admitted to FPTS cardiac telemetry under attending Dr. Erin Hearing Neurology consulted, appreciate assistance and recommendations. Heart/Carb modified diet (has passed bedside swallow) SLP evaluation PT/OT evaluation Fall precautions ASA  81 mg daily, Atorvastatin 80 mg daily  Continue home atorvastatin 80 mg Gradually normalize BP over the next several days: will restart HCTZ Follow-up BMP, CBC, HIV, lipid panel, Hgb a1c Follow-up CT angio head and neck Follow-up echocardiogram Neuro checks q4h Fall precautions  Non-oliguric AKI, likely contrast-induced Cr (!) 1.07 with baseline 0.7.  Had recent MRI contrast study a couple days ago which could be the culprit. She reports hydrating well in attempts to "flush" it out. We will consider fluid bolus if it has not improved with repeat BMP in the a.m. Hold home metformin Avoid nephrotoxins as able Follow-up BMP Encourage PO hydration  T2DM: Chronic, well controlled A1c 7.1 (8/17) and last CBG 125 (IP CBG goal <180). Patient is on Trulicity 4.62 mg weekly and metformin 500 mg BID at home.  DM seems well controlled based on A1c, will defer sensitive SSI, will monitor with CBGs per below. Holding home metformin 500 mg BID given AKI CBGs BID; if remain stable x24 hours can d/c  Follow-up A1c  HTN: Chronic, stable  Blood pressures elevated on admission to 703J-009F systolic and 81W-299B diastolic. Admitted to not taking home BP medications. Patient is on HCTZ 12.5 mg daily and losartan 100 mg daily.  Was on amlodipine in the past, however DC'd due to bilateral lower extremity edema. Continue home HCTZ 12.5 mg daily Holding home losartan given AKI  Hyperlipidemia  Previous CVA  On Atorvastatin 80 mg.  -Continue statin and ASA  -F/u lipid panel  Hypokalemia: Acute  K+ 3.2, likely from home HCTZ.  We will replete. K+ 40 mEq x 2 doses Follow-up BMP  Microcytic anemia, asymptomatic Hb 9.9, MCV 79.4. Current baseline Hb ~10-11. Anemia onset seemed to be around 2018. Low ferritin level at 13 (09/08/2020).  Reported that she is supposed to be on iron supplement, however she has  not been taking any.  She endorses a family history of multiple cancers, and is at high risk for colon  cancer. Colonoscopy (2020 by Dr. Benson Norway) showing four 1-2 mm sessile polyps that were confirmed tubular adenoma by pathology.  She is due for repeat colonoscopy in January 2023. Unclear cause of her anemia at this time- reports no abdominal pains or symptoms concerning for h. Pylori or celiac.   -Monitor CBC and monitor clinically  -Will start ferrous sulfate qod with breakfast  -Due for repeat colonoscopy in January 2023  Urinary urge incontinence: Chronic, stable Patient reports wearing pull-ups.  Reports speaking to her PCP about it, which she refused medication at the time. -F/u outpatient  Hypothyroidism: Chronic, stable Last TSH in September was normal. Patient is on Synthroid 125 mcg daily. Continue home Synthroid 125 mcg in the a.m. 2 hours before food on an empty stomach  OSA: Chronic Last sleep study 04/17/2021.  Reports that she was advised to lose weight.  Does not have a CPAP at home. Unable to view sleep study report but supposedly patient reports she was told to use CPAP.  -Offer CPAP while inpatient  MDD: chronic Patient endorses selective compliance with this. Citalopram 20 mg daily  FEN/GI: NPO Prophylaxis: Lovenox  Disposition:  Med-tele  History of Present Illness:   Morgan Collins is a 65 y.o. female presenting with visual difficulties which started about a month ago after a sleep study.  Daughter at bedside.  She was seen by her Ophthalmologist (Dr. Shanon Rosser) for her double "up and down" and blurry vision. He ordered an MRI and it showed a stroke and she was prompted to come to the ED this a.m.   Admits to intermittent headaches, in the ED only.   States that the double vision has subsided. It used to be worse while driving. She was also seeing black spots, "I thought a fly was in here". It is in the right-side of her vision (in right eye). "It is floating". The spot seems to move and is not still.  Intermittent numbness in right hand in the morning, tends to  self-subside.   States that she was told she has iron deficiency anemia. She was previously on iron supplements but has not been taking them recently. She does not recall if anyone has found out why she is iron-deficient.   Lives at home with her daughter. She is independent, does all ADL.   Daughter feels that patients mood has changed for the last month. She has been more snappy and emotional. She has also not taken her depression medication.   ED course: VSS, afebrile, BP 166/98, satting at 100% on room air. CT Head negative. Hgb low at 9.9. K 3.2. EKG NSR, QTc 400s.  Review Of Systems:  Per HPI with the following additions: Review of Systems  HENT:  Negative for congestion.   Eyes:  Positive for visual disturbance.  Respiratory:  Negative for shortness of breath.   Cardiovascular:  Negative for chest pain.  Gastrointestinal:  Negative for abdominal pain, blood in stool, nausea and vomiting.  Genitourinary:  Positive for frequency. Negative for dysuria.  Neurological:  Positive for numbness and headaches. Negative for dizziness, tremors, seizures, syncope, facial asymmetry, weakness and light-headedness.    Patient Active Problem List   Diagnosis Date Noted   Parietal lobe infarction ([place]) 05/11/2021   Iron deficiency anemia 02/23/2020   Urinary incontinence 12/28/2019   Leg swelling 09/14/2019   Carpal tunnel syndrome of right  wrist 04/10/2018   History of colonic polyps 04/10/2018   OSA (obstructive sleep apnea) 09/19/2015   Type 2 diabetes mellitus, controlled ([place]) 03/23/2015   Hyperparathyroidism, primary ([place]) 02/09/2015   Vitamin D deficiency 02/09/2015   Osteoporosis 12/28/2014   Depression 10/25/2014   Nontraumatic subcortical hemorrhage of cerebral hemisphere ([place]) 03/08/2014   HLD (hyperlipidemia) 03/08/2014   Essential hypertension 03/08/2014   Obesities, morbid ([place]) 11/22/2013   Hypothyroidism 08/18/2013   Thalamic hemorrhage ([place]) 08/04/2013   Stroke ([place])  07/27/2013   Obstructive hydrocephalus ([place]) 07/27/2013   Past Medical History: Past Medical History:  Diagnosis Date   Complication of anesthesia    encephalitis in coma 23 days- trach -reversed-no issues now   Depressed    Diabetes mellitus without complication ([place])    type 2   High cholesterol    Hypertension    Hypothyroidism    Sleep apnea    do not use cpap   Stroke ([place]) 2015   slower ,but not a problem; right thalamic hemorrhage 07/2013   Thyroid disease    Past Surgical History: Past Surgical History:  Procedure Laterality Date   ABDOMINAL HYSTERECTOMY     APPENDECTOMY     c sections     COLONOSCOPY WITH PROPOFOL N/A 12/30/2014   Procedure: COLONOSCOPY WITH PROPOFOL;  Surgeon: Carol Ada, MD;  Location: [facility];  Service: Endoscopy;  Laterality: N/A;   COLONOSCOPY WITH PROPOFOL N/A 07/03/2018   Procedure: COLONOSCOPY WITH PROPOFOL;  Surgeon: Carol Ada, MD;  Location: [facility];  Service: Endoscopy;  Laterality: N/A;   PARATHYROIDECTOMY Right 02/18/2017   Procedure: RIGHT INFERIOR PARATHYROIDECTOMY;  Surgeon: Armandina Gemma, MD;  Location: [place];  Service: General;  Laterality: Right;   POLYPECTOMY  07/03/2018   Procedure: POLYPECTOMY;  Surgeon: Carol Ada, MD;  Location: [facility];  Service: Endoscopy;;   TRACHEOSTOMY     past history of early 20's- encephalitis coma for 23 days-1982   Social History: Social History   Tobacco Use   Smoking status: Never   Smokeless tobacco: Never  Vaping Use   Vaping Use: Never used  Substance Use Topics   Alcohol use: No   Drug use: No   Please also refer to relevant sections of EMR.  Family History: Family History  Problem Relation Age of Onset   Congestive Heart Failure Mother    Hypertension Mother    Alcohol abuse Father    Cancer Sister        Breast   Dementia Brother    Sister died of breast cancer. Brother died of colon cancer.   Allergies and Medications: No Known Allergies No current  facility-administered medications on file prior to encounter.   Current Outpatient Medications on File Prior to Encounter  Medication Sig Dispense Refill   aspirin EC 81 MG tablet Take 1 tablet (81 mg total) by mouth daily. 90 tablet 3   atorvastatin (LIPITOR) 80 MG tablet Take 1 tablet (80 mg total) by mouth daily. 90 tablet 1   citalopram (CELEXA) 40 MG tablet Take 0.5 tablets (20 mg total) by mouth every evening. 60 tablet 2   Coenzyme Q10 (MEGA COQ10 PO) Take 2 capsules by mouth daily.     Cyanocobalamin (B-12 PO) Take 1 tablet by mouth daily.     ECHINACEA PO Take 1 capsule by mouth daily.     hydrochlorothiazide (MICROZIDE) 12.5 MG capsule Take 1 capsule (12.5 mg total) by mouth daily. 30 capsule 2   levothyroxine (SYNTHROID) 125 MCG tablet Take  125 mcg by mouth daily.     losartan (COZAAR) 100 MG tablet Take 1 tablet (100 mg total) by mouth daily. 90 tablet 3   metFORMIN (GLUCOPHAGE) 500 MG tablet Take 1 tablet (500 mg total) by mouth 2 (two) times daily with a meal. 180 tablet 3   Dulaglutide (TRULICITY) 1.61 WR/6.0AV SOPN Inject 0.75 mg into the skin once a week. (Patient not taking: Reported on 05/11/2021) 2 mL 0   ferrous sulfate (FERROUSUL) 325 (65 FE) MG tablet Take 1 tablet (325 mg total) by mouth daily with breakfast. (Patient not taking: Reported on 05/11/2021) 30 tablet 2    Objective: BP (!) 164/105   Pulse 71   Temp 98.6 F (37 C)   Resp 19   Ht 5\' 1"  (1.549 m)   Wt 115.7 kg   SpO2 100%   BMI 48.18 kg/m  Exam: Physical Exam Vitals and nursing note reviewed.  Constitutional:      General: She is not in acute distress.    Appearance: She is obese. She is not ill-appearing, toxic-appearing or diaphoretic.  HENT:     Head: Normocephalic and atraumatic.  Eyes:     General: No visual field deficit or scleral icterus.       Right eye: No discharge.        Left eye: No discharge.     Extraocular Movements: Extraocular movements intact.     Conjunctiva/sclera:  Conjunctivae normal.     Pupils: Pupils are equal, round, and reactive to light.  Cardiovascular:     Rate and Rhythm: Normal rate and regular rhythm.     Heart sounds: Normal heart sounds. No murmur heard.   No friction rub. No gallop.  Pulmonary:     Effort: Pulmonary effort is normal. No respiratory distress.     Breath sounds: Normal breath sounds. No wheezing or rales.  Abdominal:     Palpations: Abdomen is soft.     Tenderness: There is no abdominal tenderness. There is no guarding or rebound.  Musculoskeletal:     Cervical back: Normal range of motion.  Neurological:     Mental Status: She is alert and oriented to person, place, and time. Mental status is at baseline.     GCS: GCS eye subscore is 4. GCS verbal subscore is 5. GCS motor subscore is 6.     Cranial Nerves: Cranial nerves 2-12 are intact. No dysarthria or facial asymmetry.     Sensory: Sensation is intact.     Motor: Motor function is intact. No weakness, tremor, atrophy, abnormal muscle tone or pronator drift.     Coordination: Coordination is intact. Finger-Nose-Finger Test and Heel to [place] Test normal.     Comments: Denied blurry vision or double vision during exam.  Endorsed small, black, dot in right periphery vision of the right thigh only, that moves when she looks at it.    Labs and Imaging: CBC BMET  Recent Labs  Lab 05/11/21 1325  WBC 7.3  HGB 9.9*  HCT 34.7*  PLT 305   Recent Labs  Lab 05/11/21 1325  NA 140  K 3.2*  CL 107  CO2 21*  BUN 16  CREATININE 1.07*  GLUCOSE 125*  CALCIUM 8.3*     EKG: NSR, QTc 449.   CT HEAD WO CONTRAST  Result Date: 05/11/2021 CLINICAL DATA:  Severe acute headache EXAM: CT HEAD WITHOUT CONTRAST TECHNIQUE: Contiguous axial images were obtained from the base of the skull through the vertex without intravenous  contrast. COMPARISON:  08/03/2013 FINDINGS: Brain: Patchy white matter microvascular ischemic changes throughout both cerebral hemispheres. No acute  intracranial hemorrhage, new mass lesion, new infarction, midline shift, herniation, hydrocephalus, or extra-axial fluid collection. No focal mass effect or edema. Cisterns are patent. No cerebellar abnormality. Vascular: No hyperdense vessel or unexpected calcification. Skull: Normal. Negative for fracture or focal lesion. Sinuses/Orbits: No acute finding. Other: None. IMPRESSION: Chronic white matter microvascular changes. No acute intracranial abnormality by noncontrast CT. Electronically Signed   By: Jerilynn Mages.  Shick M.D.   On: 05/11/2021 14:56     Merrily Brittle, DO 05/11/2021, 10:21 PM PGY-1, [facility] pager: [phone], text pages welcome   FPTS Upper-Level Resident Addendum   I have independently interviewed and examined the patient. I have discussed the above with the original author and agree with their documentation. My edits for correction/addition/clarification are included where appropriate. Please see also any attending notes.   Sharion Settler, DO PGY-2, [facility] 05/12/2021 12:01 AM  FPTS Service pager: [phone] (text pages welcome through [facility])

## 2021-05-12 ENCOUNTER — Encounter (HOSPITAL_COMMUNITY): Payer: Self-pay | Admitting: Student

## 2021-05-12 ENCOUNTER — Observation Stay (HOSPITAL_COMMUNITY): Payer: Medicare Other

## 2021-05-12 ENCOUNTER — Other Ambulatory Visit: Payer: Self-pay | Admitting: Family Medicine

## 2021-05-12 DIAGNOSIS — Z8679 Personal history of other diseases of the circulatory system: Secondary | ICD-10-CM

## 2021-05-12 DIAGNOSIS — F331 Major depressive disorder, recurrent, moderate: Secondary | ICD-10-CM

## 2021-05-12 DIAGNOSIS — E785 Hyperlipidemia, unspecified: Secondary | ICD-10-CM

## 2021-05-12 DIAGNOSIS — I63233 Cerebral infarction due to unspecified occlusion or stenosis of bilateral carotid arteries: Secondary | ICD-10-CM | POA: Diagnosis not present

## 2021-05-12 DIAGNOSIS — G9389 Other specified disorders of brain: Secondary | ICD-10-CM

## 2021-05-12 DIAGNOSIS — I10 Essential (primary) hypertension: Secondary | ICD-10-CM | POA: Diagnosis not present

## 2021-05-12 DIAGNOSIS — G4733 Obstructive sleep apnea (adult) (pediatric): Secondary | ICD-10-CM | POA: Diagnosis not present

## 2021-05-12 DIAGNOSIS — I6389 Other cerebral infarction: Secondary | ICD-10-CM

## 2021-05-12 DIAGNOSIS — R29818 Other symptoms and signs involving the nervous system: Secondary | ICD-10-CM | POA: Diagnosis not present

## 2021-05-12 DIAGNOSIS — E1159 Type 2 diabetes mellitus with other circulatory complications: Secondary | ICD-10-CM | POA: Diagnosis not present

## 2021-05-12 DIAGNOSIS — D508 Other iron deficiency anemias: Secondary | ICD-10-CM

## 2021-05-12 DIAGNOSIS — E782 Mixed hyperlipidemia: Secondary | ICD-10-CM

## 2021-05-12 DIAGNOSIS — E119 Type 2 diabetes mellitus without complications: Secondary | ICD-10-CM

## 2021-05-12 DIAGNOSIS — I672 Cerebral atherosclerosis: Secondary | ICD-10-CM | POA: Diagnosis not present

## 2021-05-12 LAB — CBC
HCT: 32.9 % — ABNORMAL LOW (ref 36.0–46.0)
Hemoglobin: 9.7 g/dL — ABNORMAL LOW (ref 12.0–15.0)
MCH: 22.7 pg — ABNORMAL LOW (ref 26.0–34.0)
MCHC: 29.5 g/dL — ABNORMAL LOW (ref 30.0–36.0)
MCV: 77 fL — ABNORMAL LOW (ref 80.0–100.0)
Platelets: 293 10*3/uL (ref 150–400)
RBC: 4.27 MIL/uL (ref 3.87–5.11)
RDW: 18.6 % — ABNORMAL HIGH (ref 11.5–15.5)
WBC: 7.9 10*3/uL (ref 4.0–10.5)
nRBC: 0 % (ref 0.0–0.2)

## 2021-05-12 LAB — LIPID PANEL
Cholesterol: 162 mg/dL (ref 0–200)
HDL: 45 mg/dL (ref >40)
LDL Cholesterol: 98 mg/dL (ref 0–99)
Total CHOL/HDL Ratio: 3.6 RATIO
Triglycerides: 97 mg/dL (ref <150)
VLDL: 19 mg/dL (ref 0–40)

## 2021-05-12 LAB — GLUCOSE, CAPILLARY: Glucose-Capillary: 107 mg/dL — ABNORMAL HIGH (ref 70–99)

## 2021-05-12 LAB — BASIC METABOLIC PANEL
Anion gap: 6 (ref 5–15)
BUN: 14 mg/dL (ref 8–23)
CO2: 25 mmol/L (ref 22–32)
Calcium: 8.8 mg/dL — ABNORMAL LOW (ref 8.9–10.3)
Chloride: 103 mmol/L (ref 98–111)
Creatinine, Ser: 0.71 mg/dL (ref 0.44–1.00)
GFR, Estimated: >60 mL/min (ref >60)
Glucose, Bld: 125 mg/dL — ABNORMAL HIGH (ref 70–99)
Potassium: 4.2 mmol/L (ref 3.5–5.1)
Sodium: 134 mmol/L — ABNORMAL LOW (ref 135–145)

## 2021-05-12 LAB — C-REACTIVE PROTEIN: CRP: 0.7 mg/dL (ref <1.0)

## 2021-05-12 LAB — HIV ANTIBODY (ROUTINE TESTING W REFLEX): HIV Screen 4th Generation wRfx: NONREACTIVE

## 2021-05-12 LAB — SEDIMENTATION RATE: Sed Rate: 57 mm/hr — ABNORMAL HIGH (ref 0–22)

## 2021-05-12 MED ORDER — IOHEXOL 350 MG/ML SOLN
75.0000 mL | Freq: Once | INTRAVENOUS | Status: AC | PRN
  Administered 2021-05-12: 75 mL via INTRAVENOUS

## 2021-05-12 MED ORDER — EZETIMIBE 10 MG PO TABS
10.0000 mg | ORAL_TABLET | Freq: Every day | ORAL | Status: DC
  Administered 2021-05-12: 10 mg via ORAL
  Filled 2021-05-12: qty 1

## 2021-05-12 MED ORDER — CLOPIDOGREL BISULFATE 75 MG PO TABS: 75.0000 mg | ORAL_TABLET | Freq: Every day | ORAL | 0 refills | Status: DC

## 2021-05-12 MED ORDER — CLOPIDOGREL BISULFATE 75 MG PO TABS
75.0000 mg | ORAL_TABLET | Freq: Every day | ORAL | Status: DC
  Administered 2021-05-12: 75 mg via ORAL
  Filled 2021-05-12: qty 1

## 2021-05-12 MED ORDER — EZETIMIBE 10 MG PO TABS: 10.0000 mg | ORAL_TABLET | Freq: Every day | ORAL | 0 refills | Status: DC

## 2021-05-12 NOTE — Discharge Instructions (Addendum)
You were hospitalized at [facility] due to your blurry vision.  We expect this may have been from a stroke. The neurologist, brain doctors, recommended for you to take aspirin and Plavix for the next three weeks then switch to taking Plavix daily. Be sure to follow up with the neurologists office when scheduled and your PCP next week.  Thank you for allowing [place] to take care of you.  We recommended that you get a sleep study to get a CPAP. Additionally, it is recommended that your get a heart monitor to wear for a 30-days. Someone will contact you about these findings.   Take care, [facility] Team

## 2021-05-12 NOTE — Progress Notes (Addendum)
STROKE TEAM PROGRESS NOTE   ATTENDING NOTE: I reviewed above note and agree with the assessment and plan. Pt was seen and examined.   65 year old female with history of morbid obesity, hypertension, hyperlipidemia, diabetes, history of ICH in 07/2013 admitted for diplopia 1 month ago after sleep study.  Per patient, she had a sleep study 04/12/2021, after that he had diplopia with objects "on top of each other".  It lasted about 2 to 3 weeks and resolved.  She went to see her PCP, urgent care and ophthalmologist, no acute finding but family MRI brain in [facility] reported acute right frontal parietal white matter small infarct (I cannot review the images but only see the report).  Also reported old right SO infarcts.  CT head and neck unremarkable.  2D echo pending, A1c pending, LDL 98, ESR 57, CRP 0.7.  UDS negative.  Creatinine 0.71.  Patient denies any history of migraine, no headache except yesterday ER she had a bilateral temporal mild headache, now resolved.  Patient stated that she saw ophthalmologist Dr. Carolynn Sayers recently and reported no issues.  Patient denies any heart palpitation, racing heart.  Patient had a history of ICH and IVH in 07/2013, CT showed right thalamic ICH with IVH and hydrocephalus.  Status post EVD.  MRI showed scattered microhemorrhages in central location, more consistent with uncontrolled hypertension.  LDL 263.  Patient was discharged with Lipitor.  Follow-up with our clinic over time and started aspirin 81 after hematoma resolution.  On exam, patient neurologically intact, no focal deficit, no diplopia or visual acuity deficit.  Patient vertical diplopia 1 month ago could be related to small brainstem infarct, but no longer visible on MRI over time.  Current right frontal parietal small DWI changes on MRI likely incidental finding.  However, given possible 2 different stroke locations, recommend 30-day cardiac event monitoring as outpatient to rule out A. fib.  Although  history of ICH, it is more remote and patient BP is under control, recommend aspirin 81 and Plavix 75 DAPT for 3 weeks and then Plavix alone.  Continue Lipitor 80 and will add Zetia 10 given persistent elevation of LDL above goal.  PT/OT no recommendation.  Patient without history of migraine, denies any headache or vision loss, no papilledema with ophthalmological exam, normal CRP, not consistent with complicated migraine, temporal arteritis or pseudotumor cerebri.  She does have severe OSA and recommended CPAP titration by her pulmonologist.  Recommend continue follow-up with her ophthalmologist and pulmonologist as outpatient.  For detailed assessment and plan, please refer to above as I have made changes wherever appropriate.   Neurology will sign off. Please call with questions. Pt will follow up with stroke clinic NP at [facility] in about 4 weeks. Thanks for the consult.   Rosalin Hawking, MD PhD Stroke Neurology 05/12/2021 12:08 PM  I had long discussion with patient and daughter at bedside, updated pt current condition, treatment plan and potential prognosis, and answered all the questions.  They expressed understanding and appreciation. I also discussed with primary team. I spent  35 minutes in total face-to-face time with the patient, more than 50% of which was spent in counseling and coordination of care, reviewing test results, images and medication, and discussing the diagnosis, treatment plan and potential prognosis. This patient's care requiresreview of multiple databases, neurological assessment, discussion with family, other specialists and medical decision making of high complexity.      INTERVAL HISTORY Patient is seen in her room with her daughter at the  bedside.  She states that approximately one month ago, after she completed a sleep study, she began experiencing vertical diplopia and a headache.  She has been seen multiple times for this by her primary care provider and by  ophthalmology and underwent an MRI yesterday for workup.  The MRI revealed a subacute stroke in the right frontal parietal region, so she was instructed to come to the hospital.  Patient states that the diplopia has now resolved and denies current headache, although she did have a tension-type headache last night in the ED.  Vitals:   05/12/21 0015 05/12/21 0215 05/12/21 0415 05/12/21 0615  BP: (!) 141/93 139/80 137/84 135/80  Pulse: 76 78 79 96  Resp: '20 20 18 18  ' Temp: 98.2 F (36.8 C) 98.2 F (36.8 C) 98.3 F (36.8 C) 98.2 F (36.8 C)  TempSrc: Oral Oral Oral Oral  SpO2: 96% 97% 96% 98%  Weight:      Height:       CBC:  Recent Labs  Lab 05/11/21 1325 05/12/21 0544  WBC 7.3 7.9  NEUTROABS 4.3  --   HGB 9.9* 9.7*  HCT 34.7* 32.9*  MCV 79.4* 77.0*  PLT 305 505   Basic Metabolic Panel:  Recent Labs  Lab 05/11/21 1325 05/12/21 0544  NA 140 134*  K 3.2* 4.2  CL 107 103  CO2 21* 25  GLUCOSE 125* 125*  BUN 16 14  CREATININE 1.07* 0.71  CALCIUM 8.3* 8.8*   Lipid Panel:  Recent Labs  Lab 05/12/21 0544  CHOL 162  TRIG 97  HDL 45  CHOLHDL 3.6  VLDL 19  LDLCALC 98   HgbA1c: No results for input(s): HGBA1C in the last 168 hours. Urine Drug Screen:  Recent Labs  Lab 05/11/21 1754  LABOPIA NONE DETECTED  COCAINSCRNUR NONE DETECTED  LABBENZ NONE DETECTED  AMPHETMU NONE DETECTED  THCU NONE DETECTED  LABBARB NONE DETECTED    Alcohol Level  Recent Labs  Lab 05/11/21 1325  ETH <10    IMAGING past 24 hours CT ANGIO HEAD W OR WO CONTRAST  Result Date: 05/12/2021 CLINICAL DATA:  Stroke workup. EXAM: CT ANGIOGRAPHY HEAD AND NECK TECHNIQUE: Multidetector CT imaging of the head and neck was performed using the standard protocol during bolus administration of intravenous contrast. Multiplanar CT image reconstructions and MIPs were obtained to evaluate the vascular anatomy. Carotid stenosis measurements (when applicable) are obtained utilizing NASCET criteria, using  the distal internal carotid diameter as the denominator. CONTRAST:  40m OMNIPAQUE IOHEXOL 350 MG/ML SOLN COMPARISON:  Head CT from yesterday FINDINGS: CTA NECK FINDINGS Aortic arch: Atheromatous plaque.  Three vessel branching. Right carotid system: Mild tortuosity and atheromatous plaque. No stenosis or ulceration. Negative for beading. Left carotid system: Calcified plaque specially along the posterior wall of the proximal ICA. No stenosis, ulceration, or beading. Vertebral arteries: No proximal subclavian stenosis. The vertebral arteries are smoothly contoured and widely patent. Right dominant vertebral artery. Skeleton: Normal Other neck: Partial retropharyngeal course of the right carotid. Thyroidectomy changes. Upper chest: Negative Review of the MIP images confirms the above findings CTA HEAD FINDINGS Anterior circulation: Major vessels are diffusely patent. Mild atheromatous irregularity best seen on MIPS at the level of the M1 and A2 vessels. No branch occlusion or beading. 1 mm upward projecting bulge from the distal right MCA. Minimal plaque at the carotid siphons. Posterior circulation: Small vertebral and basilar arteries given large posterior communicating arteries. No branch occlusion, beading, or aneurysm. Mild atheromatous irregularity of the  PCAs. Intracranial tortuosity. Venous sinuses: Unremarkable Anatomic variants: As above Review of the MIP images confirms the above findings IMPRESSION: 1. No emergent finding or flow limiting stenosis of major vessels. 2. Atherosclerosis. 3. 1 mm bulge from the superior right M1 segment which could be aneurysm or lenticulostriate infundibulum. Electronically Signed   By: Jorje Guild M.D.   On: 05/12/2021 06:31   CT HEAD WO CONTRAST  Result Date: 05/11/2021 CLINICAL DATA:  Severe acute headache EXAM: CT HEAD WITHOUT CONTRAST TECHNIQUE: Contiguous axial images were obtained from the base of the skull through the vertex without intravenous contrast.  COMPARISON:  08/03/2013 FINDINGS: Brain: Patchy white matter microvascular ischemic changes throughout both cerebral hemispheres. No acute intracranial hemorrhage, new mass lesion, new infarction, midline shift, herniation, hydrocephalus, or extra-axial fluid collection. No focal mass effect or edema. Cisterns are patent. No cerebellar abnormality. Vascular: No hyperdense vessel or unexpected calcification. Skull: Normal. Negative for fracture or focal lesion. Sinuses/Orbits: No acute finding. Other: None. IMPRESSION: Chronic white matter microvascular changes. No acute intracranial abnormality by noncontrast CT. Electronically Signed   By: Jerilynn Mages.  Shick M.D.   On: 05/11/2021 14:56   CT ANGIO NECK W OR WO CONTRAST  Result Date: 05/12/2021 CLINICAL DATA:  Stroke workup. EXAM: CT ANGIOGRAPHY HEAD AND NECK TECHNIQUE: Multidetector CT imaging of the head and neck was performed using the standard protocol during bolus administration of intravenous contrast. Multiplanar CT image reconstructions and MIPs were obtained to evaluate the vascular anatomy. Carotid stenosis measurements (when applicable) are obtained utilizing NASCET criteria, using the distal internal carotid diameter as the denominator. CONTRAST:  12m OMNIPAQUE IOHEXOL 350 MG/ML SOLN COMPARISON:  Head CT from yesterday FINDINGS: CTA NECK FINDINGS Aortic arch: Atheromatous plaque.  Three vessel branching. Right carotid system: Mild tortuosity and atheromatous plaque. No stenosis or ulceration. Negative for beading. Left carotid system: Calcified plaque specially along the posterior wall of the proximal ICA. No stenosis, ulceration, or beading. Vertebral arteries: No proximal subclavian stenosis. The vertebral arteries are smoothly contoured and widely patent. Right dominant vertebral artery. Skeleton: Normal Other neck: Partial retropharyngeal course of the right carotid. Thyroidectomy changes. Upper chest: Negative Review of the MIP images confirms the above  findings CTA HEAD FINDINGS Anterior circulation: Major vessels are diffusely patent. Mild atheromatous irregularity best seen on MIPS at the level of the M1 and A2 vessels. No branch occlusion or beading. 1 mm upward projecting bulge from the distal right MCA. Minimal plaque at the carotid siphons. Posterior circulation: Small vertebral and basilar arteries given large posterior communicating arteries. No branch occlusion, beading, or aneurysm. Mild atheromatous irregularity of the PCAs. Intracranial tortuosity. Venous sinuses: Unremarkable Anatomic variants: As above Review of the MIP images confirms the above findings IMPRESSION: 1. No emergent finding or flow limiting stenosis of major vessels. 2. Atherosclerosis. 3. 1 mm bulge from the superior right M1 segment which could be aneurysm or lenticulostriate infundibulum. Electronically Signed   By: JJorje GuildM.D.   On: 05/12/2021 06:31    PHYSICAL EXAM General:  Patient is a well-developed, well-nourished female in no acute distress  Temporal region is soft without palpable vessels.   NEURO:  Mental Status: AA&Ox3  Speech/Language: speech is without dysarthria or aphasia.  Repetition, fluency, and comprehension intact.  Cranial Nerves:  II: PERRL. Visual fields full.  III, IV, VI: EOMI. Eyelids elevate symmetrically.  V: Sensation is intact to light touch and symmetrical to face.  VII: Smile is symmetrical. Able to puff cheeks and raise eyebrows.  VIII: hearing intact to voice. IX, X: Phonation is normal.  XII: tongue is midline without fasciculations. Motor: 5/5 strength to all muscle groups tested.  Sensation- Intact to light touch bilaterally.  Coordination: FTN intact bilaterally, .No drift.  Gait- deferred   ASSESSMENT/PLAN Morgan Collins is a 65 y.o. female with history of HTN, HLD, DM, ICH in 2015, hypothyroidism and OSA presenting with a one month history of vertical diplopia, blurred vision and problems with depth  perception.  She has been seen multiple times for these issues by her primary care provider and opthalmology and underwent an MRI yesterday which revealed a right frontoparietal stroke.  As the location and timing of this stroke does not explain her visual problems, it is likely an incidental finding.  She may have had another stroke about one month ago which caused her diplopia.  She denies a history of migraines or frequent headaches and has no headache at this time. She did have a tension-type headache in the ED last night which was likely related to stress.  Plan is to initiate DAPT with ASA and Plavix for 3 weeks, then Plavix alone.  Patient will also need 30 day cardiac monitoring post discharge to rule out atrial fibrillation.  Stroke, incidental finding -  right frontoparietal WM small infarct, possibly embolic vs. small vessel disease secondary to hypertension CT head No acute abnormality.  CTA head & neck No flow limiting stenosis of major vessels, atherosclerosis and 35m bulge from superior right M1 segment representing aneurysm or infundibulum MRI  small acute/subacute infarct in right frontoparietal region, small chronic lacunar infarct in right centrum seimovale, chronic hemorrhages in right thalamus and left occipital lobe 2D Echo pending CRP 0.7 ESR 57 LDL 98 HgbA1c 7.1 VTE prophylaxis - lovenox aspirin 81 mg daily prior to admission, now on aspirin 81 mg and Plavix 75 daily DAPT for 3 weeks and then Plavix alone.  Therapy recommendations:  no needs identified Disposition:  to home  Hypertension Home meds:  HCTZ 12.5 mg daily, resumed in hospital Stable Long-term BP goal normotensive  Hyperlipidemia Home meds:  Atorvastatin 80 mg daily, resumed in hospital LDL 98, goal < 70 High intensity statin continued Continue statin at discharge Add Zetia 185mRecommend close follow up with PCP for better control of hyperlipidemia  Diabetes type II Uncontrolled Home meds:  metformin  50349g BID, Trulicity 0.1.79g weekly HgbA1c pending, goal < 7.0 CBGs SSI Recommend close follow up with PCP for better control of diabetes  OSA Patient recently underwent sleep study demonstrating need for CPAP Recommend follow up with PCP for CPAP titration study Encouraged exercise and weight loss  History of ICH/IVH Patient had an ICH and IVH in 07/2013 with hydrocephalus, status post EVD.  LDL 263. AS ICH is remote, DAPT remains appropriate  Other Stroke Risk Factors Advanced Age >/= 6555Morbid obesity, Body mass index is 48.18 kg/m., BMI >/= 30 associated with increased stroke risk, recommend weight loss, diet and exercise as appropriate  Obstructive sleep apnea, not on CPAP at home   Other Active Problems Hypothyroidism Continue home synthroid MDD Continue home [facility] Melvin MSN, AGACNP-BC [facility] See Amion for schedule and pager information 05/12/2021 11:21 AM    To contact Stroke Continuity provider, please refer to [email or web address] hours, contact General Neurology

## 2021-05-12 NOTE — Progress Notes (Signed)
PT Cancellation Note  Patient Details Name: Morgan Collins MRN: [id number] DOB: [birth year]   Cancelled Treatment:    Reason Eval/Treat Not Completed: PT screened, no needs identified, will sign off per OT, patient mobilizing without difficulty, seems to be at baseline level of function with no concerns related to PT/mobility. No skilled PT needs indicated at this time. Signing off, thank you for the opportunity to participate in her care!   Windell Norfolk, DPT, PN2   Supplemental Physical Therapist [place]    Pager [phone] Acute Rehab Office [phone]

## 2021-05-12 NOTE — TOC Transition Note (Signed)
Transition of Care [facility]) - CM/SW Discharge Note   Patient Details  Name: Morgan Collins MRN: [id number] Date of Birth: [birth year]  [facility] of [facility]) CM/SW Contact:  Konrad Penta, RN Phone Number: [phone] 05/12/2021, 5:01 PM   Clinical Narrative:   Patient will need CPAP machine. Spoke with patient who confirms she recently had sleep study in Oct. 2022. Discussed DME agency. Patient has no preference.  Telephone call made to [facility] with [employer] to make referral. Jermaine states CPAP will be delivered to the home. Confirmed address on file was correct with patient.   Made patient aware. Message sent to MD to make aware that home CPAP orders are needed as well.     Final next level of care: Home/Self Care Barriers to Discharge: No Barriers Identified   Patient Goals and CMS Choice Patient states their goals for this hospitalization and ongoing recovery are:: return home CMS Medicare.gov Compare Post Acute Care list provided to:: Patient Choice offered to / list presented to : Patient  Discharge Placement                       Discharge Plan and Services                  DME Agency: [employer] Date DME Agency Contacted: 05/12/21 Time DME Agency Contacted: [phone] Representative spoke with at DME Agency: [place] ([place]) Interventions     Readmission Risk Interventions No flowsheet data found.

## 2021-05-12 NOTE — Evaluation (Signed)
Occupational Therapy Evaluation Patient Details Name: Morgan Collins MRN: [id number] DOB: [birth year] Today's Date: 05/12/2021   History of Present Illness 65 y.o. F admitted on 11/25 due to Double and blurred Vision, with an MRI on 11/23 showing a R frontal parietal infarct. PMH significant for HTN, HLD, DM2, OSA, CVA in 2015, and osteoporosis.   Clinical Impression   Pt admitted for concerns listed above. PTA pt reported that she was independent with all ADL's and IADL's, including driving. At this time, physically pt is at her baseline with no balance, coordination, or strength concerns. Per pt report her double vision is not present at this time, she is however having some difficulties with depth perception, as she often over shoots her target. Pt was educated on the safety of not driving until she is able to follow up with an eye doctor, pt reluctant, but agreeable. She has no further OT needs at this time and acute OT will sign off.       Recommendations for follow up therapy are one component of a multi-disciplinary discharge planning process, led by the attending physician.  Recommendations may be updated based on patient status, additional functional criteria and insurance authorization.   Follow Up Recommendations  No OT follow up    Assistance Recommended at Discharge None  Functional Status Assessment  Patient has not had a recent decline in their functional status  Equipment Recommendations  None recommended by OT    Recommendations for Other Services       Precautions / Restrictions Precautions Precautions: None Restrictions Weight Bearing Restrictions: No      Mobility Bed Mobility Overal bed mobility: Modified Independent                  Transfers Overall transfer level: Modified independent Equipment used: None                      Balance Overall balance assessment: Mild deficits observed, not formally tested                                          ADL either performed or assessed with clinical judgement   ADL Overall ADL's : Independent;At baseline                                       General ADL Comments: Pt up in room, toileting, spongebathign and dressing with no difficulties.     Vision Baseline Vision/History: 1 Wears glasses Ability to See in Adequate Light: 0 Adequate Patient Visual Report: Diplopia;Blurring of vision Vision Assessment?: Vision impaired- to be further tested in functional context Additional Comments: Pt tracks well, reports no diplopia at this time, able to read menu with no difficulties. Does have some depth perception deficits, tends to overshoot.     Perception     Praxis      Pertinent Vitals/Pain Pain Assessment: No/denies pain     Hand Dominance Right   Extremity/Trunk Assessment Upper Extremity Assessment Upper Extremity Assessment: Overall WFL for tasks assessed   Lower Extremity Assessment Lower Extremity Assessment: Overall WFL for tasks assessed   Cervical / Trunk Assessment Cervical / Trunk Assessment: Normal   Communication Communication Communication: No difficulties   Cognition Arousal/Alertness: Awake/alert Behavior During Therapy: WFL for tasks assessed/performed Overall  Cognitive Status: Within Functional Limits for tasks assessed                                       General Comments       Exercises     Shoulder Instructions      Home Living Family/patient expects to be discharged to:: Private residence Living Arrangements: Children Available Help at Discharge: Family Type of Home: Apartment Home Access: Level entry     Home Layout: One level     Bathroom Shower/Tub: Teacher, early years/pre: : None          Prior Functioning/Environment Prior Level of Function : Independent/Modified Independent;Driving             Mobility Comments:  No difficulties ADLs Comments: Independent        OT Problem List: Decreased activity tolerance;Impaired balance (sitting and/or standing);Impaired vision/perception;Decreased coordination      OT Treatment/Interventions:      OT Goals(Current goals can be found in the care plan section) Acute Rehab OT Goals Patient Stated Goal: To go home OT Goal Formulation: With patient Time For Goal Achievement: 05/12/21 Potential to Achieve Goals: Good  OT Frequency:     Barriers to D/C:            Co-evaluation              AM-PAC OT "6 Clicks" Daily Activity     Outcome Measure Help from another person eating meals?: None Help from another person taking care of personal grooming?: None Help from another person toileting, which includes using toliet, bedpan, or urinal?: None Help from another person bathing (including washing, rinsing, drying)?: None Help from another person to put on and taking off regular upper body clothing?: None Help from another person to put on and taking off regular lower body clothing?: None 6 Click Score: 24   End of Session Nurse Communication: Mobility status  Activity Tolerance: Patient tolerated treatment well Patient left: in bed;with call bell/phone within reach  OT Visit Diagnosis: Muscle weakness (generalized) (M62.81);Low vision, both eyes (H54.2)                Time: 7672-0947 OT Time Calculation (min): 18 min Charges:  OT General Charges $OT Visit: 1 Visit OT Evaluation $OT Eval Low Complexity: [place]., OTR/L Acute Rehabilitation  Leonie Amacher Elane Yolanda Bonine 05/12/2021, 11:17 AM

## 2021-05-12 NOTE — Discharge Summary (Signed)
[facility] Discharge Summary  Patient name: Morgan Collins Medical record number: [id number] Date of birth: [birth year] Age: 65 y.o. Gender: female Date of Admission: 05/11/2021  Date of Discharge: 05/12/21  Admitting Physician: Merrily Brittle, DO  Primary Care Provider: Alen Bleacher, MD Consultants: Neurology   Indication for Hospitalization: CVA  Discharge Diagnoses/Problem List:  Right fronto-parietal infarct, history of stroke Hyperlipidemia Acute kidney injury Type 2 diabetes Hypertension Hypokalemia Anemia Urge incontinence Hypothyroidism Obstructive sleep apnea Major depressive disorder  Disposition: Home  Discharge Condition: Stable  Discharge Exam:   GEN: pleasant well appearing female, in no acute distress  CV: regular rate and rhythm  RESP: no increased work of breathing, clear to ascultation bilaterally ABD: Bowel sounds present. Soft, non-tender, non-distended.  MSK: 1+ LE edema bilaterally SKIN: warm, dry, no rash on visible skin NEURO: alert, moves all extremities appropriately PSYCH: Normal affect, appropriate speech and behavior    Brief Hospital Course:  Morgan Collins is a 65 y.o. female who was admitted to [facility] on 11/25 for double vision and blurry vision for the last months, found to have an acute/subacute right frontoparietal CVA.  Below is her hospital course listed by problem.    Acute/Subacute Right Frontal Parietal CVA  She was sent to the ED by her ophthalmologist (Dr. Katy Fitch).  Had an outpatient MRI on 11/23 which showed acute frontal parietal stroke and she was instructed to present to the ED.  Code stroke was not activated in the ED given the subacute timeline. In the ED, CT head was negative for acute infarct.  Neurology consulted and further workup demonstrated CTA head and neck demonstrated no emergent finding or flow-limiting stenosis of the major vessels however did show a 1 mm superior  right M1 segment bulge (likely an aneurysm). They were also concerned that she may have had a small brainstem infarct about a month ago that is no longer seen on MRI.  PT/OT/SLP were consulted and recommended no outpatient follow-up.  Neurology recommended a 30-day heart monitor outpatient. She is to follow-up with neurology in 4 weeks with [facility]. She was discharged on dual antiplatelet therapy (Plavix and aspirin) for the next 3 weeks and continued her high intensity statin.   Acute kidney injury Creatinine on admission 1.07 from baseline 0.7.  Patient likely had contrast-induced injury to her kidneys which resolved.  Serum creatinine 0.71 on the day of discharge.   Home medications were continued for other chronic diseases which remained stable.     Issues for Follow Up:  Per neurology, DAPT for 3 weeks then Plavix alone. Remind pt of neurology follow up in 4 weeks with [facility].  Needs cardiac event monitor. If patient does not have one setup. Place order at follow up.  Needs to follow-up with pulmonology for CPAP titration.  Sleep study was concerning for OSA.  CBC at follow up for normocytic anemia.  Evaluate pt for urinary incontinence.   Significant Procedures:   Significant Labs and Imaging:  Recent Labs  Lab 05/11/21 1325 05/12/21 0544  WBC 7.3 7.9  HGB 9.9* 9.7*  HCT 34.7* 32.9*  PLT 305 293   Recent Labs  Lab 05/11/21 1325 05/12/21 0544  NA 140 134*  K 3.2* 4.2  CL 107 103  CO2 21* 25  GLUCOSE 125* 125*  BUN 16 14  CREATININE 1.07* 0.71  CALCIUM 8.3* 8.8*  ALKPHOS 97  --   AST 31  --   ALT 21  --  ALBUMIN 3.6  --    Hemoglobin A1c 7.1.  CT ANGIO HEAD W OR WO CONTRAST  Result Date: 05/12/2021 CLINICAL DATA:  Stroke workup. EXAM: CT ANGIOGRAPHY HEAD AND NECK TECHNIQUE: Multidetector CT imaging of the head and neck was performed using the standard protocol during bolus administration of intravenous contrast. Multiplanar CT image  reconstructions and MIPs were obtained to evaluate the vascular anatomy. Carotid stenosis measurements (when applicable) are obtained utilizing NASCET criteria, using the distal internal carotid diameter as the denominator. CONTRAST:  78mL OMNIPAQUE IOHEXOL 350 MG/ML SOLN COMPARISON:  Head CT from yesterday FINDINGS: CTA NECK FINDINGS Aortic arch: Atheromatous plaque.  Three vessel branching. Right carotid system: Mild tortuosity and atheromatous plaque. No stenosis or ulceration. Negative for beading. Left carotid system: Calcified plaque specially along the posterior wall of the proximal ICA. No stenosis, ulceration, or beading. Vertebral arteries: No proximal subclavian stenosis. The vertebral arteries are smoothly contoured and widely patent. Right dominant vertebral artery. Skeleton: Normal Other neck: Partial retropharyngeal course of the right carotid. Thyroidectomy changes. Upper chest: Negative Review of the MIP images confirms the above findings CTA HEAD FINDINGS Anterior circulation: Major vessels are diffusely patent. Mild atheromatous irregularity best seen on MIPS at the level of the M1 and A2 vessels. No branch occlusion or beading. 1 mm upward projecting bulge from the distal right MCA. Minimal plaque at the carotid siphons. Posterior circulation: Small vertebral and basilar arteries given large posterior communicating arteries. No branch occlusion, beading, or aneurysm. Mild atheromatous irregularity of the PCAs. Intracranial tortuosity. Venous sinuses: Unremarkable Anatomic variants: As above Review of the MIP images confirms the above findings IMPRESSION: 1. No emergent finding or flow limiting stenosis of major vessels. 2. Atherosclerosis. 3. 1 mm bulge from the superior right M1 segment which could be aneurysm or lenticulostriate infundibulum. Electronically Signed   By: Jorje Guild M.D.   On: 05/12/2021 06:31   CT HEAD WO CONTRAST  Result Date: 05/11/2021 CLINICAL DATA:  Severe acute  headache EXAM: CT HEAD WITHOUT CONTRAST TECHNIQUE: Contiguous axial images were obtained from the base of the skull through the vertex without intravenous contrast. COMPARISON:  08/03/2013 FINDINGS: Brain: Patchy white matter microvascular ischemic changes throughout both cerebral hemispheres. No acute intracranial hemorrhage, new mass lesion, new infarction, midline shift, herniation, hydrocephalus, or extra-axial fluid collection. No focal mass effect or edema. Cisterns are patent. No cerebellar abnormality. Vascular: No hyperdense vessel or unexpected calcification. Skull: Normal. Negative for fracture or focal lesion. Sinuses/Orbits: No acute finding. Other: None. IMPRESSION: Chronic white matter microvascular changes. No acute intracranial abnormality by noncontrast CT. Electronically Signed   By: Jerilynn Mages.  Shick M.D.   On: 05/11/2021 14:56   CT ANGIO NECK W OR WO CONTRAST  Result Date: 05/12/2021 CLINICAL DATA:  Stroke workup. EXAM: CT ANGIOGRAPHY HEAD AND NECK TECHNIQUE: Multidetector CT imaging of the head and neck was performed using the standard protocol during bolus administration of intravenous contrast. Multiplanar CT image reconstructions and MIPs were obtained to evaluate the vascular anatomy. Carotid stenosis measurements (when applicable) are obtained utilizing NASCET criteria, using the distal internal carotid diameter as the denominator. CONTRAST:  46mL OMNIPAQUE IOHEXOL 350 MG/ML SOLN COMPARISON:  Head CT from yesterday FINDINGS: CTA NECK FINDINGS Aortic arch: Atheromatous plaque.  Three vessel branching. Right carotid system: Mild tortuosity and atheromatous plaque. No stenosis or ulceration. Negative for beading. Left carotid system: Calcified plaque specially along the posterior wall of the proximal ICA. No stenosis, ulceration, or beading. Vertebral arteries: No proximal subclavian stenosis.  The vertebral arteries are smoothly contoured and widely patent. Right dominant vertebral artery.  Skeleton: Normal Other neck: Partial retropharyngeal course of the right carotid. Thyroidectomy changes. Upper chest: Negative Review of the MIP images confirms the above findings CTA HEAD FINDINGS Anterior circulation: Major vessels are diffusely patent. Mild atheromatous irregularity best seen on MIPS at the level of the M1 and A2 vessels. No branch occlusion or beading. 1 mm upward projecting bulge from the distal right MCA. Minimal plaque at the carotid siphons. Posterior circulation: Small vertebral and basilar arteries given large posterior communicating arteries. No branch occlusion, beading, or aneurysm. Mild atheromatous irregularity of the PCAs. Intracranial tortuosity. Venous sinuses: Unremarkable Anatomic variants: As above Review of the MIP images confirms the above findings IMPRESSION: 1. No emergent finding or flow limiting stenosis of major vessels. 2. Atherosclerosis. 3. 1 mm bulge from the superior right M1 segment which could be aneurysm or lenticulostriate infundibulum. Electronically Signed   By: Jorje Guild M.D.   On: 05/12/2021 06:31      Results/Tests Pending at Time of Discharge:   Discharge Medications:  Allergies as of 05/12/2021   No Known Allergies      Medication List     TAKE these medications    aspirin EC 81 MG tablet Take 1 tablet (81 mg total) by mouth daily.   atorvastatin 80 MG tablet Commonly known as: LIPITOR Take 1 tablet (80 mg total) by mouth daily.   B-12 PO Take 1 tablet by mouth daily.   citalopram 40 MG tablet Commonly known as: CELEXA Take 0.5 tablets (20 mg total) by mouth every evening.   clopidogrel 75 MG tablet Commonly known as: PLAVIX Take 1 tablet (75 mg total) by mouth daily for 21 days.   ECHINACEA PO Take 1 capsule by mouth daily.   ezetimibe 10 MG tablet Commonly known as: ZETIA Take 1 tablet (10 mg total) by mouth daily. Start taking on: May 13, 2021   ferrous sulfate 325 (65 FE) MG tablet Commonly known  as: FerrouSul Take 1 tablet (325 mg total) by mouth daily with breakfast.   hydrochlorothiazide 12.5 MG capsule Commonly known as: Microzide Take 1 capsule (12.5 mg total) by mouth daily.   levothyroxine 125 MCG tablet Commonly known as: SYNTHROID Take 125 mcg by mouth daily.   losartan 100 MG tablet Commonly known as: COZAAR Take 1 tablet (100 mg total) by mouth daily.   MEGA COQ10 PO Take 2 capsules by mouth daily.   metFORMIN 500 MG tablet Commonly known as: GLUCOPHAGE Take 1 tablet (500 mg total) by mouth 2 (two) times daily with a meal.   Trulicity 8.12 XN/1.7GY Sopn Generic drug: Dulaglutide Inject 0.75 mg into the skin once a week.               Durable Medical Equipment  (From admission, onward)           Start     Ordered   05/12/21 1718  For home use only DME continuous positive airway pressure (CPAP)  Once       Question Answer Comment  Length of Need Lifetime   Patient has OSA or probable OSA Yes   Settings Autotitration   CPAP supplies needed Mask, headgear, cushions, filters, heated tubing and water chamber      05/12/21 1717            Discharge Instructions: Please refer to Patient Instructions section of EMR for full details.  Patient was counseled important signs  and symptoms that should prompt return to medical care, changes in medications, dietary instructions, activity restrictions, and follow up appointments.   Follow-Up Appointments:  Follow-up Information     [facility]. Schedule an appointment as soon as possible for a visit in 1 month(s).   Specialty: Neurology Why: stroke clinic Contact information: [address], [place], [place] 05/12/2021 [place]

## 2021-05-14 ENCOUNTER — Other Ambulatory Visit: Payer: Self-pay | Admitting: Student

## 2021-05-14 ENCOUNTER — Telehealth: Payer: Self-pay

## 2021-05-14 LAB — HEMOGLOBIN A1C
Hgb A1c MFr Bld: 7.8 % — ABNORMAL HIGH (ref 4.8–5.6)
Mean Plasma Glucose: 177 mg/dL

## 2021-05-14 NOTE — Telephone Encounter (Signed)
Patient calls nurse line requesting a refill on Levothyroxine 100.   Patient reports PCP changed her dose from 125.  Please advise and send in 100 if appropriate.

## 2021-05-15 ENCOUNTER — Other Ambulatory Visit: Payer: Self-pay | Admitting: Student

## 2021-05-15 DIAGNOSIS — E039 Hypothyroidism, unspecified: Secondary | ICD-10-CM

## 2021-05-15 MED ORDER — LEVOTHYROXINE SODIUM 112 MCG PO TABS: 112.0000 ug | ORAL_TABLET | Freq: Every day | ORAL | 2 refills | Status: DC

## 2021-05-15 NOTE — Progress Notes (Signed)
Refilled patient's Levothyroxine for hypothyroidism.

## 2021-05-16 ENCOUNTER — Telehealth: Payer: Self-pay

## 2021-05-16 NOTE — Telephone Encounter (Signed)
Called to discuss PREP, she wants to attend next class at [facility] starting 12/12, every M/W 11:30-12:45; assessment visit scheduled for 12/7 at 2pm.

## 2021-05-17 MED ORDER — LEVOTHYROXINE SODIUM 112 MCG PO TABS: 112.0000 ug | ORAL_TABLET | ORAL | 2 refills | Status: DC

## 2021-05-17 NOTE — Telephone Encounter (Signed)
Per chart review patient is to be on 135mcg.   PCP sent in prescription on 11/29, however it was set to "print."   I have resent this.

## 2021-05-22 ENCOUNTER — Other Ambulatory Visit (HOSPITAL_COMMUNITY): Payer: Self-pay

## 2021-05-22 NOTE — Telephone Encounter (Signed)
Shredded pending app, pt never returned from Oct 2022

## 2021-05-23 NOTE — Progress Notes (Signed)
[facility] PREP Evaluation  Patient Details  Name: NEVIN KOZUCH MRN: [id number] Date of Birth: 1955/06/24 Age: 65 y.o. PCP: Alen Bleacher, MD  Vitals:   05/23/21 1512  BP: 128/82  Pulse: 83  SpO2: 98%  Weight: 254 lb 8 oz (115.4 kg)     [facility] Eval - 05/23/21 1500       [facility] "PREP" Location   [facility] "PREP" Location [facility]      Referral    Referring Provider Adah Salvage    Reason for referral Diabetes;High Cholesterol;Hypertension;Inactivity;Obesitity/Overweight    Program Start Date 05/28/21      Measurement   Waist Circumference 51.5 inches    Hip Circumference 59 inches    Body fat --   E4 too high to measure     Information for Trainer   Goals --   Lose 10-15 pounds by end of program; establish exercise routine; decreased shortness of breath/increase stamina   Current Exercise none    Orthopedic Concerns NA    Pertinent Medical History --   OSA, HTN, hx strokes, diabetes   Current Barriers --   none     Timed Up and Go (TUGS)   Timed Up and Go Moderate risk 10-12 seconds      Mobility and Daily Activities   I find it easy to walk up or down two or more flights of stairs. 2    I have no trouble taking out the trash. 4    I do housework such as vacuuming and dusting on my own without difficulty. 4    I can easily lift a gallon of milk (8lbs). 4    I can easily walk a mile. 1    I have no trouble reaching into high cupboards or reaching down to pick up something from the floor. 1    I do not have trouble doing out-door work such as Armed forces logistics/support/administrative officer, raking leaves, or gardening. 1      Mobility and Daily Activities   I feel younger than my age. 4    I feel independent. 2    I feel energetic. 2    I live an active life.  4    I feel strong. 2    I feel healthy. 2    I feel active as other people my age. 1      How fit and strong are you.   Fit and Strong Total Score 34            Past Medical History:  Diagnosis Date   Complication of anesthesia     encephalitis in coma 23 days- trach -reversed-no issues now   Depressed    Diabetes mellitus without complication ([place])    type 2   High cholesterol    History of colonic polyps 04/10/2018   Her colonoscopy in 2016 revealed multiple polyps.  Per patient there were found to be benign.  She was told to follow-up for repeat colonoscopy in 3 to 5 years.   Hypertension    Hypothyroidism    Nontraumatic subcortical hemorrhage of cerebral hemisphere ([place]) 03/08/2014   Obstructive hydrocephalus ([place]) 07/27/2013   Sleep apnea    do not use cpap   Stroke ([place]) 2015   slower ,but not a problem; right thalamic hemorrhage 07/2013   Thalamic hemorrhage ([place]) 08/04/2013   Thyroid disease    Past Surgical History:  Procedure Laterality Date   ABDOMINAL HYSTERECTOMY     APPENDECTOMY  c sections     COLONOSCOPY WITH PROPOFOL N/A 12/30/2014   Procedure: COLONOSCOPY WITH PROPOFOL;  Surgeon: Carol Ada, MD;  Location: [facility];  Service: Endoscopy;  Laterality: N/A;   COLONOSCOPY WITH PROPOFOL N/A 07/03/2018   Procedure: COLONOSCOPY WITH PROPOFOL;  Surgeon: Carol Ada, MD;  Location: [facility];  Service: Endoscopy;  Laterality: N/A;   PARATHYROIDECTOMY Right 02/18/2017   Procedure: RIGHT INFERIOR PARATHYROIDECTOMY;  Surgeon: Armandina Gemma, MD;  Location: [place];  Service: General;  Laterality: Right;   POLYPECTOMY  07/03/2018   Procedure: POLYPECTOMY;  Surgeon: Carol Ada, MD;  Location: [facility];  Service: Endoscopy;;   TRACHEOSTOMY     past history of early 20's- encephalitis coma for 23 days-1982   Social History   Tobacco Use  Smoking Status Never  Smokeless Tobacco Never  To begin PREP class 12/12, every M/W 11:30-12:45 at [employer] [facility] 05/23/2021, 3:17 PM

## 2021-05-24 DIAGNOSIS — H5203 Hypermetropia, bilateral: Secondary | ICD-10-CM | POA: Diagnosis not present

## 2021-05-24 DIAGNOSIS — H53462 Homonymous bilateral field defects, left side: Secondary | ICD-10-CM | POA: Diagnosis not present

## 2021-05-24 DIAGNOSIS — H2513 Age-related nuclear cataract, bilateral: Secondary | ICD-10-CM | POA: Diagnosis not present

## 2021-05-28 NOTE — Progress Notes (Signed)
[facility] PREP Weekly Session  Patient Details  Name: Morgan Collins MRN: [id number] Date of Birth: [birth year] Age: 65 y.o. PCP: Alen Bleacher, MD  There were no vitals filed for this visit.   [facility] Weekly seesion - 05/28/21 1300       [facility] "PREP" Location   [facility] "PREP" Location [facility]      Weekly Session   Topic Discussed Goal setting and welcome to the program   tour of facility   Classes attended to date [place] 05/28/2021, 1:15 PM

## 2021-06-01 ENCOUNTER — Telehealth: Payer: Self-pay | Admitting: Student

## 2021-06-01 ENCOUNTER — Encounter: Payer: Self-pay | Admitting: Student

## 2021-06-01 ENCOUNTER — Ambulatory Visit (INDEPENDENT_AMBULATORY_CARE_PROVIDER_SITE_OTHER): Payer: Medicare Other | Admitting: Student

## 2021-06-01 ENCOUNTER — Other Ambulatory Visit: Payer: Self-pay

## 2021-06-01 VITALS — BP 125/81 | HR 93 | Ht 62.0 in | Wt 256.0 lb

## 2021-06-01 DIAGNOSIS — R32 Unspecified urinary incontinence: Secondary | ICD-10-CM

## 2021-06-01 DIAGNOSIS — E782 Mixed hyperlipidemia: Secondary | ICD-10-CM | POA: Diagnosis not present

## 2021-06-01 DIAGNOSIS — D508 Other iron deficiency anemias: Secondary | ICD-10-CM | POA: Diagnosis not present

## 2021-06-01 DIAGNOSIS — I6389 Other cerebral infarction: Secondary | ICD-10-CM

## 2021-06-01 MED ORDER — CLOPIDOGREL BISULFATE 75 MG PO TABS: 75.0000 mg | ORAL_TABLET | Freq: Every day | ORAL | 1 refills | Status: DC

## 2021-06-01 MED ORDER — EZETIMIBE 10 MG PO TABS: 10.0000 mg | ORAL_TABLET | Freq: Every day | ORAL | 0 refills | Status: DC

## 2021-06-01 NOTE — Telephone Encounter (Signed)
Called patient and recommended follow up in 2-3 weeks for hematuria and patient is amendable to plan.

## 2021-06-01 NOTE — Patient Instructions (Addendum)
It was wonderful to see you today. Thank you for allowing me to be a part of your care. Below is a short summary of what we discussed at your visit today:  Collected lab for your blood count  Placed referral to Urology to assess the blood in urine    If you have any questions or concerns, please do not hesitate to contact us via phone or MyChart message.   Alen Bleacher, MD [facility]

## 2021-06-01 NOTE — Assessment & Plan Note (Signed)
Patient report having Urinary incontinence since her first CVA event years ago. She reports no change in urinary urgency or frequency. Incontinence occur with sudden strong urge to void. No dysuria. Recommend patient practice scheduled voiding and not to wait until she has an urge to void. Patient is amendable to scheduled frequent voiding.

## 2021-06-01 NOTE — Progress Notes (Signed)
° ° °  SUBJECTIVE:   CHIEF COMPLAINT / HPI:   [name] is a 65 year old female who presents today for post hospitalization follow-up.  Was recently seen at the hospital and found to have 1 mm superior M1 aneurysm and possible small old brainstem infarct.  Initial complaint was blurry vision which prompted MRI studies.  She reported her blurry vision have since been resolved and she is doing well.  Patient denies any headaches, dizziness, or extremity weaknesses.  Urinary Incontinence Patient reported history of incontinence which was described at the hospital visit.  She reports her urinary incontinence started since her first CVA years ago. She currently wears pad and on HCTZ for BP control. She repo She denies any dysuria or frequency but reports her incontinence are mainly due to urgency when she starts feeling need to void. She noted bright red blood in her urine this morning. First episode and no prior history of hematuria or bladder cancer.   PERTINENT  PMH / PSH: CVA, HTN, T2DM  OBJECTIVE:   BP 125/81    Pulse 93    Ht 5\' 2"  (1.575 m)    Wt 256 lb (116.1 kg)    SpO2 100%    BMI 46.82 kg/m    Physical Exam General: Alert, well appearing, NAD Cardiovascular: RRR, No Murmurs, Normal S2/S2 Respiratory: CTAB, No wheezing or Rales Abdomen: No distension or tenderness Extremities: No edema on extremities   Skin: Warm and dry  ASSESSMENT/PLAN:   Urinary incontinence Patient report having Urinary incontinence since her first CVA event years ago. She reports no change in urinary urgency or frequency. Incontinence occur with sudden strong urge to void. No dysuria. Recommend patient practice scheduled voiding and not to wait until she has an urge to void. Patient is amendable to scheduled frequent voiding.  Hematuria Patient reported one isolated hematuria that occurred this morning. No prior history of hematuria or recent abdominal trauma. She denies any dysuria, fever, or  abdomina/pelvic pain. Given her new medication of Aspirin and Plavix., there is remote concern for possible GU bleeding. Other differential to consider is possible bladder cancer. Will send referral to Urology for further assessment. Discussed follow up in 2-3 weeks or earlier if patient experience reoccurrence. She is amendable to plan.   Sleep Study   CPAP Patient had a recent sleep study completed that recommended starting CPAP. There are no stated setting for her CPAP on the encounter note. Discussed with patient to reach out to Pulmonology and the sleep study team to discuss her CPAP needs.   Alen Bleacher, MD [place]

## 2021-06-01 NOTE — Telephone Encounter (Signed)
Pt is requesting doctor call her to let her know when to come back to see him for an appt. Ph # [phone]

## 2021-06-02 LAB — CBC
Hematocrit: 34.3 % (ref 34.0–46.6)
Hemoglobin: 10.4 g/dL — ABNORMAL LOW (ref 11.1–15.9)
MCH: 23 pg — ABNORMAL LOW (ref 26.6–33.0)
MCHC: 30.3 g/dL — ABNORMAL LOW (ref 31.5–35.7)
MCV: 76 fL — ABNORMAL LOW (ref 79–97)
Platelets: 320 10*3/uL (ref 150–450)
RBC: 4.53 x10E6/uL (ref 3.77–5.28)
RDW: 16.9 % — ABNORMAL HIGH (ref 11.7–15.4)
WBC: 9.6 10*3/uL (ref 3.4–10.8)

## 2021-06-04 ENCOUNTER — Other Ambulatory Visit: Payer: Self-pay | Admitting: Student

## 2021-06-04 DIAGNOSIS — D509 Iron deficiency anemia, unspecified: Secondary | ICD-10-CM

## 2021-06-04 NOTE — Progress Notes (Unsigned)
Ordered ferritin for decision of IV iron vs oral.

## 2021-06-04 NOTE — Progress Notes (Signed)
Known IDA, unknown cause, on iron. Called patient with results and will order a future ferritin to evaluate if there is need for IV iron supplementation. UTD on colonoscopy, ?h/o of hematuria. Pt takes her iron supplement ever day as instructed with no other active signs of bleeding.

## 2021-06-06 NOTE — Telephone Encounter (Signed)
Patient calls nurse line reporting increased blood in urine. Patient reports she has gone to the bathroom 3x since last night and has noticed a "teaspoon" amount in the toilet each time. Patient denies any fevers, abdominal pain, dysuria, urinary frequency, back or flank pain.   Patient scheduled for 12/23 as she out of town until tomorrow evening. Red flags discussed with patient. Patient encouraged to push fluids.

## 2021-06-08 ENCOUNTER — Ambulatory Visit: Payer: Medicare Other | Admitting: Family Medicine

## 2021-06-14 ENCOUNTER — Ambulatory Visit (INDEPENDENT_AMBULATORY_CARE_PROVIDER_SITE_OTHER): Payer: Medicare Other | Admitting: Student

## 2021-06-14 ENCOUNTER — Ambulatory Visit: Payer: Medicare Other | Admitting: Adult Health

## 2021-06-14 ENCOUNTER — Encounter: Payer: Self-pay | Admitting: Adult Health

## 2021-06-14 ENCOUNTER — Other Ambulatory Visit: Payer: Self-pay

## 2021-06-14 ENCOUNTER — Encounter: Payer: Self-pay | Admitting: Student

## 2021-06-14 VITALS — BP 115/82 | HR 93 | Ht 62.0 in | Wt 253.8 lb

## 2021-06-14 VITALS — BP 133/82 | HR 107 | Ht 62.0 in | Wt 254.0 lb

## 2021-06-14 DIAGNOSIS — R319 Hematuria, unspecified: Secondary | ICD-10-CM | POA: Diagnosis not present

## 2021-06-14 DIAGNOSIS — I6389 Other cerebral infarction: Secondary | ICD-10-CM | POA: Diagnosis not present

## 2021-06-14 DIAGNOSIS — D508 Other iron deficiency anemias: Secondary | ICD-10-CM | POA: Diagnosis not present

## 2021-06-14 DIAGNOSIS — I639 Cerebral infarction, unspecified: Secondary | ICD-10-CM

## 2021-06-14 DIAGNOSIS — B9689 Other specified bacterial agents as the cause of diseases classified elsewhere: Secondary | ICD-10-CM | POA: Diagnosis not present

## 2021-06-14 DIAGNOSIS — N760 Acute vaginitis: Secondary | ICD-10-CM

## 2021-06-14 DIAGNOSIS — I10 Essential (primary) hypertension: Secondary | ICD-10-CM | POA: Diagnosis not present

## 2021-06-14 LAB — POCT UA - MICROSCOPIC ONLY
RBC, Urine, Miroscopic: >20 (ref 0–2)
WBC, Ur, HPF, POC: >20 (ref 0–5)

## 2021-06-14 LAB — POCT URINALYSIS DIP (MANUAL ENTRY)
Bilirubin, UA: NEGATIVE
Glucose, UA: NEGATIVE mg/dL
Ketones, POC UA: NEGATIVE mg/dL
Nitrite, UA: NEGATIVE
Protein Ur, POC: 100 mg/dL — AB
Spec Grav, UA: 1.02 (ref 1.010–1.025)
Urobilinogen, UA: 2 E.U./dL — AB
pH, UA: 7 (ref 5.0–8.0)

## 2021-06-14 MED ORDER — CLOPIDOGREL BISULFATE 75 MG PO TABS: 75.0000 mg | ORAL_TABLET | Freq: Every day | ORAL | 1 refills | Status: DC

## 2021-06-14 MED ORDER — METRONIDAZOLE 500 MG PO TABS: 500.0000 mg | ORAL_TABLET | Freq: Two times a day (BID) | ORAL | 0 refills | Status: AC

## 2021-06-14 MED ORDER — HYDROCHLOROTHIAZIDE 12.5 MG PO CAPS: 12.5000 mg | ORAL_CAPSULE | Freq: Every day | ORAL | 2 refills | Status: DC

## 2021-06-14 MED ORDER — EZETIMIBE 10 MG PO TABS: 10.0000 mg | ORAL_TABLET | Freq: Every day | ORAL | 0 refills | Status: DC

## 2021-06-14 NOTE — Patient Instructions (Addendum)
It was wonderful to meet you today. Thank you for allowing me to be a part of your care. Below is a short summary of what we discussed at your visit today:  Which continued blood in the urine with primary referral to see urology.  No urine analysis that was done today showed blood in urine and moderate white blood cells.  Sent in prescription for Flagyl to be taken twice daily for 7 days to cover for bacterial vaginosis.  [facility] [address].  Please bring all of your medications to every appointment!  If you have any questions or concerns, please do not hesitate to contact us via phone or MyChart message.   Alen Bleacher, MD [facility]

## 2021-06-14 NOTE — Patient Instructions (Addendum)
Continue clopidogrel 75 mg daily  and atorvastatin and Zetia for secondary stroke prevention  Please stop aspirin at this time as no longer needed  Please ensure follow-up with pulmonology to start CPAP as untreated severe sleep apnea greatly increases risk of additional strokes: [place] were seen by Dr. Annamaria Boots Office number: [phone]    You will be called to set up a 30 day cardiac monitor by cardiology  Continue to follow up with PCP regarding cholesterol, blood pressure and diabetes management  Maintain strict control of hypertension with blood pressure goal below 130/90, diabetes with hemoglobin A1c goal below 7.0 % and cholesterol with LDL cholesterol (bad cholesterol) goal below 70 mg/dL.   Signs of a Stroke? Follow the BEFAST method:  Balance Watch for a sudden loss of balance, trouble with coordination or vertigo Eyes Is there a sudden loss of vision in one or both eyes? Or double vision?  Face: Ask the person to smile. Does one side of the face droop or is it numb?  Arms: Ask the person to raise both arms. Does one arm drift downward? Is there weakness or numbness of a leg? Speech: Ask the person to repeat a simple phrase. Does the speech sound slurred/strange? Is the person confused ? Time: If you observe any of these signs, call 911.    Follow-up in 6 months or call earlier if needed    Thank you for coming to see [place] at [facility]. I hope we have been able to provide you high quality care today.  You may receive a patient satisfaction survey over the next few weeks. We would appreciate your feedback and comments so that we may continue to improve ourselves and the health of our patients.   Stroke Prevention Some medical conditions and lifestyle choices can lead to a higher risk for a stroke. You can help to prevent a stroke by eating healthy foods and exercising. It also helps to not smoke and to manage any health  problems you may have. How can this condition affect me? A stroke is an emergency. It should be treated right away. A stroke can lead to brain damage or threaten your life. There is a better chance of surviving and getting better after a stroke if you get medical help right away. What can increase my risk? The following medical conditions may increase your risk of a stroke: Diseases of the heart and blood vessels (cardiovascular disease). High blood pressure (hypertension). Diabetes. High cholesterol. Sickle cell disease. Problems with blood clotting. Being very overweight. Sleeping problems (obstructivesleep apnea). Other risk factors include: Being older than age 44. A history of blood clots, stroke, or mini-stroke (TIA). Race, ethnic background, or a family history of stroke. Smoking or using tobacco products. Taking birth control pills, especially if you smoke. Heavy alcohol and drug use. Not being active. What actions can I take to prevent this? Manage your health conditions High cholesterol. Eat a healthy diet. If this is not enough to manage your cholesterol, you may need to take medicines. Take medicines as told by your doctor. High blood pressure. Try to keep your blood pressure below 130/80. If your blood pressure cannot be managed through a healthy diet and regular exercise, you may need to take medicines. Take medicines as told by your doctor. Ask your doctor if you should check your blood pressure at home. Have your blood pressure checked every year. Diabetes. Eat a healthy diet and get regular exercise.  If your blood sugar (glucose) cannot be managed through diet and exercise, you may need to take medicines. Take medicines as told by your doctor. Talk to your doctor about getting checked for sleeping problems. Signs of a problem can include: Snoring a lot. Feeling very tired. Make sure that you manage any other conditions you have. Nutrition  Follow instructions  from your doctor about what to eat or drink. You may be told to: Eat and drink fewer calories each day. Limit how much salt (sodium) you use to 1,500 milligrams (mg) each day. Use only healthy fats for cooking, such as olive oil, canola oil, and sunflower oil. Eat healthy foods. To do this: Choose foods that are high in fiber. These include whole grains, and fresh fruits and vegetables. Eat at least 5 servings of fruits and vegetables a day. Try to fill one-half of your plate with fruits and vegetables at each meal. Choose low-fat (lean) proteins. These include low-fat cuts of meat, chicken without skin, fish, tofu, beans, and nuts. Eat low-fat dairy products. Avoid foods that: Are high in salt. Have saturated fat. Have trans fat. Have cholesterol. Are processed or pre-made. Count how many carbohydrates you eat and drink each day. Lifestyle If you drink alcohol: Limit how much you have to: 0-1 drink a day for women who are not pregnant. 0-2 drinks a day for men. Know how much alcohol is in your drink. In the [place] one drink equals one 12 oz bottle of beer (327mL), one 5 oz glass of wine (117mL), or one 1 oz glass of hard liquor (48mL). Do not smoke or use any products that have nicotine or tobacco. If you need help quitting, ask your doctor. Avoid secondhand smoke. Do not use drugs. Activity  Try to stay at a healthy weight. Get at least 30 minutes of exercise on most days, such as: Fast walking. Biking. Swimming. Medicines Take over-the-counter and prescription medicines only as told by your doctor. Avoid taking birth control pills. Talk to your doctor about the risks of taking birth control pills if: You are over 8 years old. You smoke. You get very bad headaches. You have had a blood clot. Where to find more information American Stroke Association: www.strokeassociation.org Get help right away if: You or a loved one has any signs of a stroke. "BE FAST" is an easy way to  remember the warning signs: B - Balance. Dizziness, sudden trouble walking, or loss of balance. E - Eyes. Trouble seeing or a change in how you see. F - Face. Sudden weakness or loss of feeling of the face. The face or eyelid may droop on one side. A - Arms. Weakness or loss of feeling in an arm. This happens all of a sudden and most often on one side of the body. S - Speech. Sudden trouble speaking, slurred speech, or trouble understanding what people say. T - Time. Time to call emergency services. Write down what time symptoms started. You or a loved one has other signs of a stroke, such as: A sudden, very bad headache with no known cause. Feeling like you may vomit (nausea). Vomiting. A seizure. These symptoms may be an emergency. Get help right away. Call your local emergency services (911 in the [place] Do not wait to see if the symptoms will go away. Do not drive yourself to the hospital. Summary You can help to prevent a stroke by eating healthy, exercising, and not smoking. It also helps to manage any health problems  you have. Do not smoke or use any products that contain nicotine or tobacco. Get help right away if you or a loved one has any signs of a stroke. This information is not intended to replace advice given to you by your health care provider. Make sure you discuss any questions you have with your health care provider. Document Revised: 01/03/2020 Document Reviewed: 01/03/2020 Elsevier Patient Education  [place].

## 2021-06-14 NOTE — Assessment & Plan Note (Addendum)
Patient reports continud hematuria since first episode on 06/01/21. She denies any dysuria or increased urinary frequency. No recent pelvic or abdominal trauma. UA was positive for large RBC. Patient is currently on DAPT of Aspirin and Plavix for recent CVA event which could be contributory to her hematuria. Although no history of tobacco use, given her age there is consideration for possible bladder cancer. Will send in Urology referral for further assessment.

## 2021-06-14 NOTE — Progress Notes (Signed)
[facility] [address] [place]. [place] [address] [phone]       HOSPITAL FOLLOW UP NOTE  [name] Date of Birth:  Jul 07, 1955 Medical Record Number:  [id number]   Reason for Referral:  hospital stroke follow up    SUBJECTIVE:   CHIEF COMPLAINT:  Chief Complaint  Patient presents with   Follow-up    RM 3 alone Pt is well and stable,  no stroke concerns     HPI:   Morgan Collins is a 65 year old female with history of morbid obesity, hypertension, hyperlipidemia, diabetes, history of ICH in 07/2013 who presented on 05/11/2021 for 1 mo onset of diplopia. Workup outpatient and after MRI showed acute/subacute stroke, sent to ED by her ophthalmologist Dr. Katy Fitch.  Personally reviewed hospitalization pertinent progress notes, lab work and imaging.  Evaluated by Dr. Erlinda Hong - MRI showed right frontal parietal stroke felt to be incidental finding as not related to symptoms of diplopia which may have been from small brainstem stroke no longer visible on MRI. MRI also showed small chronic lacunar infarcts in the right centrum semiovale and chronic hemorrhages in the right thalamus and left occipital lobe.  CTA head/neck negative LVO.  LDL 98.  A1c 7.1.  CRP 0.7.  ESR 57.  Etiology for acute infarct possibly embolic vs small vessel disease secondary to hypertension.  Recommended 30-day cardiac event monitoring outpatient to rule out A. fib.  Recommended DAPT for 3 weeks and Plavix alone.  HTN stable.  Added Zetia in addition to atorvastatin 80 mg daily and to ensure follow-up with PCP for both uncontrolled HLD and DM.  Recently completed sleep study which preceded diplopia complaints - dx'd with severe OSA - advised f/u for recommended titration study.  PT/OT no therapy needs and discharged home.    Today, 06/14/2021, patient being seen for initial hospital follow-up unaccompanied.  Overall stable.  Denies new or reoccurring stroke/TIA symptoms.  Remains on  aspirin as well as atorvastatin and Zetia. She was only supplied a 3-week duration of Plavix at recent discharge and currently waiting for a new prescription supplied by PCP - she plans on restarting plavix and stopping aspirin once she picks this up.  Is awaiting further evaluation with urology for new onset of hematuria.  Blood pressure today 133/82.  She has not yet initiated CPAP - she reports never receiving a call regarding the results and unsure who to call to obtain these. She is concerned that she was never call to do cardiac monitor - upon review of discharge notes, they recommended order be placed at follow-up for unknown reason.  No further concerns at this time       [place]  Per hospital follow-up CT head No acute abnormality.  CTA head & neck No flow limiting stenosis of major vessels, atherosclerosis and 5m bulge from superior right M1 segment representing aneurysm or infundibulum MRI  small acute/subacute infarct in right frontoparietal region, small chronic lacunar infarct in right centrum seimovale, chronic hemorrhages in right thalamus and left occipital lobe CRP 0.7 ESR 57 LDL 98 HgbA1c 7.1    ROS:   14 system review of systems performed and negative with exception of those listed in HPI  PMH:  Past Medical History:  Diagnosis Date   Complication of anesthesia    encephalitis in coma 23 days- trach -reversed-no issues now   Depressed    Diabetes mellitus without complication (HCC)    type 2   High cholesterol  History of colonic polyps 04/10/2018   Her colonoscopy in 2016 revealed multiple polyps.  Per patient there were found to be benign.  She was told to follow-up for repeat colonoscopy in 3 to 5 years.   Hypertension    Hypothyroidism    Nontraumatic subcortical hemorrhage of cerebral hemisphere ([place]) 03/08/2014   Obstructive hydrocephalus ([place]) 07/27/2013   Sleep apnea    do not use cpap   Stroke ([place]) 2015   slower ,but not a problem;  right thalamic hemorrhage 07/2013   Thalamic hemorrhage ([place]) 08/04/2013   Thyroid disease     PSH:  Past Surgical History:  Procedure Laterality Date   ABDOMINAL HYSTERECTOMY     APPENDECTOMY     c sections     COLONOSCOPY WITH PROPOFOL N/A 12/30/2014   Procedure: COLONOSCOPY WITH PROPOFOL;  Surgeon: Carol Ada, MD;  Location: [facility];  Service: Endoscopy;  Laterality: N/A;   COLONOSCOPY WITH PROPOFOL N/A 07/03/2018   Procedure: COLONOSCOPY WITH PROPOFOL;  Surgeon: Carol Ada, MD;  Location: [facility];  Service: Endoscopy;  Laterality: N/A;   PARATHYROIDECTOMY Right 02/18/2017   Procedure: RIGHT INFERIOR PARATHYROIDECTOMY;  Surgeon: Armandina Gemma, MD;  Location: [place];  Service: General;  Laterality: Right;   POLYPECTOMY  07/03/2018   Procedure: POLYPECTOMY;  Surgeon: Carol Ada, MD;  Location: [facility];  Service: Endoscopy;;   TRACHEOSTOMY     past history of early 20's- encephalitis coma for 23 days-1982    Social History:  Social History   Socioeconomic History   Marital status: Single    Spouse name: Not on file   Number of children: 2   Years of education: college   Highest education level: Not on file  Occupational History   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    Employer: [employer]  Tobacco Use   Smoking status: Never   Smokeless tobacco: Never  Vaping Use   Vaping Use: Never used  Substance and Sexual Activity   Alcohol use: No   Drug use: No   Sexual activity: Not Currently  Other Topics Concern   Not on file  Social History Narrative         Current Social History 04/15/2017           Patient lives with daughter, Verdie Drown in one level home 04/15/2017   Transportation: Patient has own vehicle and drives herself 37/90/2409   Important Relationships "My family, sister, nieces and nephews and friend 04/15/2017    Pets: None 04/15/2017   Education / Work:  One year of [employer] 04/15/2017   Interests / Fun: "Take care of  everyone else." 04/15/2017   Current Stressors: "I want to be on my own (not living with daughter)" 04/15/2017   Religious / Personal Beliefs: I believe in God 04/15/2017   Other: "I do for others before taking care of myself." 04/15/2017   L. Silvano Rusk, RN, BSN                                                                                                 Social Determinants of Health  Financial Resource Strain: Not on file  Food Insecurity: Not on file  Transportation Needs: Not on file  Physical Activity: Not on file  Stress: Not on file  Social Connections: Not on file  Intimate Partner Violence: Not on file    Family History:  Family History  Problem Relation Age of Onset   Congestive Heart Failure Mother    Hypertension Mother    Alcohol abuse Father    Cancer Sister        Breast   Dementia Brother     Medications:   Current Outpatient Medications on File Prior to Visit  Medication Sig Dispense Refill   aspirin EC 81 MG tablet Take 1 tablet (81 mg total) by mouth daily. 90 tablet 3   atorvastatin (LIPITOR) 80 MG tablet Take 1 tablet (80 mg total) by mouth daily. 90 tablet 1   citalopram (CELEXA) 40 MG tablet Take 0.5 tablets (20 mg total) by mouth every evening. 60 tablet 2   Coenzyme Q10 (MEGA COQ10 PO) Take 2 capsules by mouth daily.     Cyanocobalamin (B-12 PO) Take 1 tablet by mouth daily.     ECHINACEA PO Take 1 capsule by mouth daily.     ferrous sulfate (FERROUSUL) 325 (65 FE) MG tablet Take 1 tablet (325 mg total) by mouth daily with breakfast. 30 tablet 2   levothyroxine (SYNTHROID) 112 MCG tablet Take 1 tablet (112 mcg total) by mouth every morning. 30 minutes before food 30 tablet 2   losartan (COZAAR) 100 MG tablet Take 1 tablet (100 mg total) by mouth daily. 90 tablet 3   metFORMIN (GLUCOPHAGE) 500 MG tablet Take 1 tablet (500 mg total) by mouth 2 (two) times daily with a meal. 180 tablet 3   No current facility-administered medications on file prior to  visit.    Allergies:  No Known Allergies    OBJECTIVE:  Physical Exam  Vitals:   06/14/21 1412  BP: 133/82  Pulse: (!) 107  Weight: 254 lb (115.2 kg)  Height: '5\' 2"'  (1.575 m)   Body mass index is 46.46 kg/m. No results found.  Post stroke PHQ 2/9 Depression screen PHQ 2/9 06/14/2021  Decreased Interest 1  Down, Depressed, Hopeless 0  PHQ - 2 Score 1  Altered sleeping 2  Tired, decreased energy 1  Change in appetite 0  Feeling bad or failure about yourself  0  Trouble concentrating 2  Moving slowly or fidgety/restless 1  Suicidal thoughts 0  PHQ-9 Score 7  Difficult doing work/chores Not difficult at all  Some recent data might be hidden     General: Morbidly obese very pleasant middle-aged African-American female, seated, in no evident distress Head: head normocephalic and atraumatic.   Neck: supple with no carotid or supraclavicular bruits Cardiovascular: regular rate and rhythm, no murmurs Musculoskeletal: no deformity Skin:  no rash/petichiae Vascular:  Normal pulses all extremities   Neurologic Exam Mental Status: Awake and fully alert.  Fluent speech and language.  Oriented to place and time. Recent and remote memory intact. Attention span, concentration and fund of knowledge appropriate. Mood and affect appropriate.  Cranial Nerves: Fundoscopic exam reveals sharp disc margins. Pupils equal, briskly reactive to light. Extraocular movements full without nystagmus. Visual fields full to confrontation. Hearing intact. Facial sensation intact. Face, tongue, palate moves normally and symmetrically.  Motor: Normal bulk and tone. Normal strength in all tested extremity muscles Sensory.: intact to touch , pinprick , position and vibratory sensation.  Coordination: Rapid alternating movements  normal in all extremities. Finger-to-nose and heel-to-shin performed accurately bilaterally. Gait and Station: Arises from chair without difficulty. Stance is normal. Gait  demonstrates normal stride length and balance without use of assistive device.  Difficulty performing tandem walk and heel toe (due to body habitus).  Reflexes: 1+ and symmetric. Toes downgoing.     NIHSS  0 Modified Rankin  0      ASSESSMENT: KHALIAH BARNICK is a 65 y.o. year old female with recent incidental finding of acute/subacute right frontal parietal white matter small infarct secondary to SVD vs possibly embolic found on MRI on 22/07/5425 during evaluation for 1 mo onset of diplopia - stroke location does not explain diplopia symptoms -possibly small brainstem infarct no longer visualized on MRI. Vascular risk factors include hx of ICH/IVH 2015 s/p EVD, morbid obesity (BMI 48), new dx of severe OSA, HTN, HLD, DM and prior stroke on imaging.      PLAN:  R frontal WM stroke :  No residual deficit.   Restart clopidogrel 75 mg daily and stop aspirin and continue atorvastatin 80 mg daily and Zetia for secondary stroke prevention.   Order placed for a 30-day cardiac event monitor as requested by Dr. Erlinda Hong Discussed secondary stroke prevention measures and importance of close PCP follow up for aggressive stroke risk factor management. I have gone over the pathophysiology of stroke, warning signs and symptoms, risk factors and their management in some detail with instructions to go to the closest emergency room for symptoms of concern. HTN: BP goal <130/90.  Stable on hydrochlorothiazide per PCP HLD: LDL goal <70. Recent LDL 98.  Continue atorvastatin 80 mg daily and Zetia 10 mg daily managed by PCP DMII: A1c goal<7.0. Recent A1c 7.1.  Monitored by PCP Severe OSA: Followed by pulmonology - recent sleep study showed AHI of 45.3/h -discussed importance of initiating CPAP - provided office number information from sleep clinic to go over results    Follow up in 6 months or call earlier if needed   CC:  [place] provider: Dr. Leonie Man PCP: Alen Bleacher, MD    I spent 57 minutes of  face-to-face and non-face-to-face time with patient.  This included previsit chart review including review of recent hospitalization, lab review, study review, order entry, electronic health record documentation, patient education regarding recent stroke including possible etiologies, secondary stroke prevention measures and importance of managing stroke risk factors and answered all other questions to patient satisfaction  Frann Rider, AGNP-BC  [facility] [address] [place],  [address]  Phone [phone] Fax [phone] Note: This document was prepared with digital dictation and possible smart phrase technology. Any transcriptional errors that result from this process are unintentional.

## 2021-06-14 NOTE — Progress Notes (Signed)
° ° °  SUBJECTIVE:   CHIEF COMPLAINT / HPI:   [name] is a 65 year old female who presented for follow-up of hematuria.  Currently on aspirin and Plavix for recent CVA. She first reported hematuria on 12/16.  Patient today says she continues to have hematuria but denies any increased frequency or dysuria. No recent pelvic or abdominal trauma.  She report noticing recent fishy odor smell with voiding, otherwise has no other complaints. Patient is not sexual activity and denies any vaginal discharge or irritation.    PERTINENT  PMH / PSH: T2DM, HTN, CVA, Hypothyroidism,.  OBJECTIVE:   BP 115/82    Pulse 93    Ht 5\' 2"  (1.575 m)    Wt 253 lb 12.8 oz (115.1 kg)    SpO2 96%    BMI 46.42 kg/m    Physical Exam General: Alert, well appearing, NAD Cardiovascular: RRR, No Murmurs, Normal S2/S2 Respiratory: CTAB, No wheezing or Rales Abdomen: No distension or tenderness Extremities: No edema on extremities   Skin: Warm and dry  ASSESSMENT/PLAN:   Hematuria Patient reports continud hematuria since first episode on 06/01/21. She denies any dysuria or increased urinary frequency. No recent pelvic or abdominal trauma. UA was positive for large RBC. Patient is currently on DAPT of Aspirin and Plavix for recent CVA event which could be contributory to her hematuria. Although no history of tobacco use, given her age there is consideration for possible bladder cancer. Will send in Urology referral for further assessment.   Possible Bacteria Vaginosis Patient reports recent history of fish order smell with voiding.  UA showed moderate leukocytosis and pH of 7.  Will start patient on metronidazole 500 mg twice daily for 7 days.  Follow-up in 4 weeks for hematuria and vaginal fishy odor.Alen Bleacher, MD [place]

## 2021-06-15 LAB — FERRITIN: Ferritin: 26 ng/mL (ref 15–150)

## 2021-06-18 NOTE — Progress Notes (Signed)
I agree with the above plan 

## 2021-06-18 NOTE — Progress Notes (Signed)
[facility] PREP Weekly Session  Patient Details  Name: Morgan Collins MRN: [id number] Date of Birth: [birth year] Age: 66 y.o. PCP: Alen Bleacher, MD  Vitals:   06/18/21 1448  Weight: 253 lb 3.2 oz (114.9 kg)     [facility] Weekly seesion - 06/18/21 1400       [facility] "PREP" Location   [facility] "PREP" Location [facility]      Weekly Session   Topic Discussed Healthy eating tips    Classes attended to date [place] 06/18/2021, 2:50 PM

## 2021-06-22 ENCOUNTER — Other Ambulatory Visit: Payer: Self-pay | Admitting: Student

## 2021-06-22 DIAGNOSIS — R7303 Prediabetes: Secondary | ICD-10-CM

## 2021-06-25 NOTE — Progress Notes (Signed)
[facility] PREP Weekly Session  Patient Details  Name: Morgan Collins MRN: [id number] Date of Birth: Nov 03, 1955 Age: 67 y.o. PCP: Alen Bleacher, MD  Vitals:   06/25/21 1304  Weight: 251 lb (113.9 kg)     [facility] Weekly seesion - 06/25/21 1300       [facility] "PREP" Location   [facility] "PREP" Location [facility]      Weekly Session   Topic Discussed Health habits   sugar demo   Classes attended to date [place] 06/25/2021, 1:05 PM

## 2021-07-02 NOTE — Progress Notes (Signed)
[facility] PREP Weekly Session  Patient Details  Name: Morgan Collins MRN: [id number] Date of Birth: March 06, 1956 Age: 66 y.o. PCP: Alen Bleacher, MD  Vitals:   07/02/21 1513  Weight: 248 lb 6.4 oz (112.7 kg)     [facility] Weekly seesion - 07/02/21 1500       [facility] "PREP" Location   [facility] "PREP" Location [facility]      Weekly Session   Topic Discussed Restaurant Eating   Salt demo; limit sodium intake to 1500-2300 mg. daily   Minutes exercised this week 10 minutes    Classes attended to date [place] 07/02/2021, 3:13 PM

## 2021-07-13 NOTE — Progress Notes (Signed)
CARDIOLOGY CONSULT NOTE       Patient ID: Morgan Collins MRN: [id number] DOB/AGE: 1956-04-24 66 y.o.  Admit date: (Not on file) Referring Physician: [place] Primary Physician: Alen Bleacher, MD Primary Cardiologist: New Reason for Consultation: Recurrent Stroke  Active Problems:   * No active hospital problems. *   HPI:  66 y.o. referred by Dr McDiarmid for recurrent strokes Hospitalized 05/12/21 with double vision and subacute right frontoparietal CVA. CTA with No carotid dx. Seen by neurology and outpatient monitor recommended D/c with high dose statin and DAT plavix / asa She has OSA and uses CPAP DM-2 and anemia No echo done She had echo 04/24/17 for stroke that was normal ECG 05/21/21 SR rate 75 LVH otherwise normal   Seen by Frann Rider NP with neurology 06/14/21 indicates ICH in 2015 she appears to have had small vessel dx with lacunar infarct in right cedntrum semiovale and right thalamus seems more consistent with small vessel dx rather than embolic to my read   She has had some hematuria when on DAT A1c elevated at 7.1   She is frustrated by lack of coordinated care and fact she still has not gotten monitor presumably to r/o PAF   ROS All other systems reviewed and negative except as noted above  Past Medical History:  Diagnosis Date   Complication of anesthesia    encephalitis in coma 23 days- trach -reversed-no issues now   Depressed    Diabetes mellitus without complication ([place])    type 2   High cholesterol    History of colonic polyps 04/10/2018   Her colonoscopy in 2016 revealed multiple polyps.  Per patient there were found to be benign.  She was told to follow-up for repeat colonoscopy in 3 to 5 years.   Hypertension    Hypothyroidism    Nontraumatic subcortical hemorrhage of cerebral hemisphere ([place]) 03/08/2014   Obstructive hydrocephalus ([place]) 07/27/2013   Sleep apnea    do not use cpap   Stroke ([place]) 2015   slower ,but not a problem; right  thalamic hemorrhage 07/2013   Thalamic hemorrhage ([place]) 08/04/2013   Thyroid disease     Family History  Problem Relation Age of Onset   Congestive Heart Failure Mother    Hypertension Mother    Alcohol abuse Father    Cancer Sister        Breast   Dementia Brother     Social History   Socioeconomic History   Marital status: Single    Spouse name: Not on file   Number of children: 2   Years of education: college   Highest education level: Not on file  Occupational History   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    Employer: [employer]  Tobacco Use   Smoking status: Never   Smokeless tobacco: Never  Vaping Use   Vaping Use: Never used  Substance and Sexual Activity   Alcohol use: No   Drug use: No   Sexual activity: Not Currently  Other Topics Concern   Not on file  Social History Narrative         Current Social History 04/15/2017           Patient lives with daughter, Verdie Drown in one level home 04/15/2017   Transportation: Patient has own vehicle and drives herself 66/44/0347   Important Relationships "My family, sister, nieces and nephews and friend 04/15/2017    Pets: None 04/15/2017   Education / Work:  One year of [employer]  Youth Dept 04/15/2017   Interests / Fun: "Take care of everyone else." 04/15/2017   Current Stressors: "I want to be on my own (not living with daughter)" 04/15/2017   Religious / Personal Beliefs: I believe in God 04/15/2017   Other: "I do for others before taking care of myself." 04/15/2017   L. Silvano Rusk, RN, BSN                                                                                                 Social Determinants of Health   Financial Resource Strain: Not on file  Food Insecurity: Not on file  Transportation Needs: Not on file  Physical Activity: Not on file  Stress: Not on file  Social Connections: Not on file  Intimate Partner Violence: Not on file    Past Surgical History:  Procedure Laterality Date    ABDOMINAL HYSTERECTOMY     APPENDECTOMY     c sections     COLONOSCOPY WITH PROPOFOL N/A 12/30/2014   Procedure: COLONOSCOPY WITH PROPOFOL;  Surgeon: Carol Ada, MD;  Location: [facility];  Service: Endoscopy;  Laterality: N/A;   COLONOSCOPY WITH PROPOFOL N/A 07/03/2018   Procedure: COLONOSCOPY WITH PROPOFOL;  Surgeon: Carol Ada, MD;  Location[facility];  Service: Endoscopy;  Laterality: N/A;   PARATHYROIDECTOMY Right 02/18/2017   Procedure: RIGHT INFERIOR PARATHYROIDECTOMY;  Surgeon: Armandina Gemma, MD;  Location: [place];  Service: General;  Laterality: Right;   POLYPECTOMY  07/03/2018   Procedure: POLYPECTOMY;  Surgeon: Carol Ada, MD;  [facility];  Service: Endoscopy;;   TRACHEOSTOMY     past history of early 20's- encephalitis coma for 23 days-1982      Current Outpatient Medications:    aspirin EC 81 MG tablet, Take 1 tablet (81 mg total) by mouth daily., Disp: 90 tablet, Rfl: 3   atorvastatin (LIPITOR) 80 MG tablet, Take 1 tablet (80 mg total) by mouth daily., Disp: 90 tablet, Rfl: 1   citalopram (CELEXA) 40 MG tablet, Take 0.5 tablets (20 mg total) by mouth every evening., Disp: 60 tablet, Rfl: 2   clopidogrel (PLAVIX) 75 MG tablet, Take 1 tablet (75 mg total) by mouth daily., Disp: 30 tablet, Rfl: 1   Coenzyme Q10 (MEGA COQ10 PO), Take 2 capsules by mouth daily., Disp: , Rfl:    Cyanocobalamin (B-12 PO), Take 1 tablet by mouth daily., Disp: , Rfl:    ECHINACEA PO, Take 1 capsule by mouth daily., Disp: , Rfl:    ezetimibe (ZETIA) 10 MG tablet, Take 1 tablet (10 mg total) by mouth daily., Disp: 30 tablet, Rfl: 0   ferrous sulfate (FERROUSUL) 325 (65 FE) MG tablet, Take 1 tablet (325 mg total) by mouth daily with breakfast., Disp: 30 tablet, Rfl: 2   hydrochlorothiazide (MICROZIDE) 12.5 MG capsule, Take 1 capsule (12.5 mg total) by mouth daily., Disp: 30 capsule, Rfl: 2   levothyroxine (SYNTHROID) 112 MCG tablet, Take 1 tablet (112 mcg total) by mouth every morning.  30 minutes before food, Disp: 30 tablet, Rfl: 2   losartan (COZAAR) 100 MG tablet, Take 1 tablet (100 mg total)  by mouth daily., Disp: 90 tablet, Rfl: 3   metFORMIN (GLUCOPHAGE) 500 MG tablet, Take 1 tablet (500 mg total) by mouth 2 (two) times daily with a meal., Disp: 180 tablet, Rfl: 3    Physical Exam: Blood pressure 128/80, pulse 90, height 5\' 1"  (1.549 m), weight 250 lb (113.4 kg), SpO2 96 %.    Affect appropriate Chronically ill obese female  HEENT: normal Neck supple with no adenopathy JVP normal no bruits no thyromegaly Lungs clear with no wheezing and good diaphragmatic motion Heart:  S1/S2 no murmur, no rub, gallop or click PMI normal Abdomen: benighn, BS positve, no tenderness, no AAA no bruit.  No HSM or HJR Distal pulses intact with no bruits No edema Neuro non-focal Skin warm and dry No muscular weakness   Labs:   Lab Results  Component Value Date   WBC 9.6 06/01/2021   HGB 10.4 (L) 06/01/2021   HCT 34.3 06/01/2021   MCV 76 (L) 06/01/2021   PLT 320 06/01/2021   No results for input(s): NA, K, CL, CO2, BUN, CREATININE, CALCIUM, PROT, BILITOT, ALKPHOS, ALT, AST, GLUCOSE in the last 168 hours.  Invalid input(s): LABALBU Lab Results  Component Value Date   TROPONINI <0.30 01/19/2014    Lab Results  Component Value Date   CHOL 162 05/12/2021   CHOL 167 12/28/2019   CHOL 181 04/22/2019   Lab Results  Component Value Date   HDL 45 05/12/2021   HDL 45 12/28/2019   HDL 53 04/22/2019   Lab Results  Component Value Date   LDLCALC 98 05/12/2021   LDLCALC 108 (H) 12/28/2019   LDLCALC 108 (H) 04/22/2019   Lab Results  Component Value Date   TRIG 97 05/12/2021   TRIG 74 12/28/2019   TRIG 110 04/22/2019   Lab Results  Component Value Date   CHOLHDL 3.6 05/12/2021   CHOLHDL 3.7 12/28/2019   CHOLHDL 3.4 04/22/2019   No results found for: LDLDIRECT    Radiology: No results found.  EKG: See HPI NSR LVH no acute changes    ASSESSMENT AND PLAN:    Recurrent stroke:  neuro placed order for 30 day monitor ECG ok no palpitations to suspect PAF and none in hospital Needs updated echo with bubble study Her strokes seem mostly to be lacunar and small vessel not consistent with PAF  Plavix Rx F/U with neurology HLD:  continue statin and zetia f/u primary  DM:  Discussed low carb diet.  Target hemoglobin A1c is 6.5 or less.  Continue current medications HTN:  Well controlled.  Continue current medications and low sodium Dash type diet.   OSA:  needs CPAP titration and weight loss per neuro  Thyroid:  continue synthroid replacement  Anemia:  on ferrous sulfate labs with primary Anxiety/Depression:  on Celexa   Updated echo with bubble study Not clear why this was not done in hospital Also delay in getting monitor ordered by neuro will arrange  No need for general cardiology f/u unless monitor shows PAF   Signed: Jenkins [place] 07/16/2021, 11:04 AM

## 2021-07-16 ENCOUNTER — Encounter: Payer: Self-pay | Admitting: Cardiovascular Disease

## 2021-07-16 ENCOUNTER — Ambulatory Visit: Payer: Medicare HMO | Admitting: Cardiovascular Disease

## 2021-07-16 ENCOUNTER — Other Ambulatory Visit: Payer: Self-pay

## 2021-07-16 VITALS — BP 128/80 | HR 90 | Ht 61.0 in | Wt 250.0 lb

## 2021-07-16 DIAGNOSIS — E782 Mixed hyperlipidemia: Secondary | ICD-10-CM | POA: Diagnosis not present

## 2021-07-16 DIAGNOSIS — G4733 Obstructive sleep apnea (adult) (pediatric): Secondary | ICD-10-CM

## 2021-07-16 DIAGNOSIS — I10 Essential (primary) hypertension: Secondary | ICD-10-CM

## 2021-07-16 DIAGNOSIS — G459 Transient cerebral ischemic attack, unspecified: Secondary | ICD-10-CM | POA: Diagnosis not present

## 2021-07-16 NOTE — Addendum Note (Signed)
Addended by: Aris Georgia, Deakon Frix L on: 07/16/2021 11:17 AM   Modules accepted: Orders

## 2021-07-16 NOTE — Patient Instructions (Signed)
Medication Instructions:  *If you need a refill on your cardiac medications before your next appointment, please call your pharmacy*  Lab Work: If you have labs (blood work) drawn today and your tests are completely normal, you will receive your results only by: [place] (if you have MyChart) OR A paper copy in the mail If you have any lab test that is abnormal or we need to change your treatment, we will call you to review the results.  Testing/Procedures: Your physician has recommended that you wear an event monitor. Event monitors are medical devices that record the hearts electrical activity. Doctors most often [place] these monitors to diagnose arrhythmias. Arrhythmias are problems with the speed or rhythm of the heartbeat. The monitor is a small, portable device. You can wear one while you do your normal daily activities. This is usually used to diagnose what is causing palpitations/syncope (passing out).  Your physician has requested that you have an echocardiogram with bubble study. Echocardiography is a painless test that uses sound waves to create images of your heart. It provides your doctor with information about the size and shape of your heart and how well your hearts chambers and valves are working. This procedure takes approximately one hour. There are no restrictions for this procedure.  Follow-Up: At [facility], you and your health needs are our priority.  As part of our continuing mission to provide you with exceptional heart care, we have created designated Provider Care Teams.  These Care Teams include your primary Cardiologist (physician) and Advanced Practice Providers (APPs -  Physician Assistants and Nurse Practitioners) who all work together to provide you with the care you need, when you need it.  We recommend signing up for the patient portal called "MyChart".  Sign up information is provided on this After Visit Summary.  MyChart is used to connect with patients  for Virtual Visits (Telemedicine).  Patients are able to view lab/test results, encounter notes, upcoming appointments, etc.  Non-urgent messages can be sent to your provider as well.   To learn more about what you can do with MyChart, go to [email or web address].    Your next appointment:   As needed

## 2021-07-19 ENCOUNTER — Ambulatory Visit (INDEPENDENT_AMBULATORY_CARE_PROVIDER_SITE_OTHER): Payer: Medicare HMO | Admitting: Student

## 2021-07-19 ENCOUNTER — Other Ambulatory Visit: Payer: Self-pay

## 2021-07-19 ENCOUNTER — Encounter: Payer: Self-pay | Admitting: Student

## 2021-07-19 DIAGNOSIS — D508 Other iron deficiency anemias: Secondary | ICD-10-CM | POA: Diagnosis not present

## 2021-07-19 DIAGNOSIS — R319 Hematuria, unspecified: Secondary | ICD-10-CM | POA: Diagnosis not present

## 2021-07-19 MED ORDER — EZETIMIBE 10 MG PO TABS: 10.0000 mg | ORAL_TABLET | Freq: Every day | ORAL | 2 refills | Status: DC

## 2021-07-19 NOTE — Progress Notes (Signed)
° ° °  SUBJECTIVE:   CHIEF COMPLAINT / HPI: Hematuria follow-up  [name] presented today for follow-up concerning hematuria.  She reports her hematuria has cleared and denies any increased urinary frequency, dysuria or abdominal pain.  Patient said the hematuria cleared after completing metronidazole which was given to have for treatment of bacterial vaginosis.  Patient reports she has not and the urologist and have not gotten any calls yet from them.  She does not pick up calls from unknown numbers due to excess cost from telecommunication companies.  PERTINENT  PMH / PSH: HTN, thyroidism, T2DM, CVA.  OBJECTIVE:   BP (!) 137/91    Pulse 75    Wt 249 lb 3.2 oz (113 kg)    SpO2 100%    BMI 47.09 kg/m     Physical Exam General: Alert, well appearing, NAD, Cardiovascular: RRR, No Murmurs Respiratory: CTAB  Abdomen: No distension or tenderness Extremities: No edema on extremities    ASSESSMENT/PLAN:   Hematuria Last CBC showed hemoglobin of 10.6 and MCV of 76 consistent with microcytic anemia.  Patient had 3 weeks of hematuria which today she said has resolved following treatment with metronidazole for bacterial vaginosis.  Denies any dysuria, hematuria or increased urinary frequency. Although  hematuria seems to be resolved and might be due to infectious process, given her age will provide patient contact to urology for further assessment.    Health maintenance Patient's colonoscopy is overdue.  Sent in referral for colonoscopy.    Alen Bleacher, MD [place]

## 2021-07-19 NOTE — Assessment & Plan Note (Addendum)
Last CBC showed hemoglobin of 10.6 and MCV of 76 consistent with microcytic anemia.  Patient had 3 weeks of hematuria which today she said has resolved following treatment with metronidazole for bacterial vaginosis.  Denies any dysuria, hematuria or increased urinary frequency. Although  hematuria seems to be resolved and might be due to infectious process, given her age will provide patient contact to urology for further assessment.

## 2021-07-19 NOTE — Patient Instructions (Addendum)
It was wonderful to see you today. Thank you for allowing me to be a part of your care. Below is a short summary of what we discussed at your visit today:  Glad to hear blood in urine has cleared.  Follow-up with your urology referral.  Contact for [facility] is [phone]  Sent in referral for colonoscopy with GI.  Follow-up in 3 months or earlier as needed.  Please bring all of your medications to every appointment!  If you have any questions or concerns, please do not hesitate to contact us via phone or MyChart message.   Alen Bleacher, MD [facility]

## 2021-07-23 NOTE — Progress Notes (Signed)
[facility] PREP Weekly Session  Patient Details  Name: Morgan Collins MRN: [id number] Date of Birth: 20-Jan-1956 Age: 66 y.o. PCP: Alen Bleacher, MD  Vitals:   07/23/21 1249  Weight: 250 lb (113.4 kg)     [facility] Weekly seesion - 07/23/21 1200       [facility] "PREP" Location   [facility] "PREP" Location [facility]      Weekly Session   Topic Discussed Other   Portion size matters; visualize your portion size demo; review of food labels   Classes attended to date [place] 07/23/2021, 12:50 PM

## 2021-07-24 ENCOUNTER — Other Ambulatory Visit: Payer: Self-pay | Admitting: Student

## 2021-07-24 DIAGNOSIS — Z1231 Encounter for screening mammogram for malignant neoplasm of breast: Secondary | ICD-10-CM

## 2021-07-25 ENCOUNTER — Ambulatory Visit: Payer: Medicare HMO

## 2021-07-25 ENCOUNTER — Other Ambulatory Visit: Payer: Self-pay

## 2021-07-25 ENCOUNTER — Other Ambulatory Visit: Payer: Self-pay | Admitting: Cardiovascular Disease

## 2021-07-25 ENCOUNTER — Ambulatory Visit
Admission: RE | Admit: 2021-07-25 | Discharge: 2021-07-25 | Disposition: A | Discharge status: Home / Self Care | Payer: Medicare HMO | Source: Ambulatory Visit

## 2021-07-25 ENCOUNTER — Ambulatory Visit (INDEPENDENT_AMBULATORY_CARE_PROVIDER_SITE_OTHER): Payer: Medicare HMO

## 2021-07-25 DIAGNOSIS — G459 Transient cerebral ischemic attack, unspecified: Secondary | ICD-10-CM | POA: Diagnosis not present

## 2021-07-25 DIAGNOSIS — I4891 Unspecified atrial fibrillation: Secondary | ICD-10-CM | POA: Diagnosis not present

## 2021-07-25 DIAGNOSIS — E782 Mixed hyperlipidemia: Secondary | ICD-10-CM

## 2021-07-25 DIAGNOSIS — I10 Essential (primary) hypertension: Secondary | ICD-10-CM

## 2021-07-25 DIAGNOSIS — G4733 Obstructive sleep apnea (adult) (pediatric): Secondary | ICD-10-CM

## 2021-07-25 DIAGNOSIS — Z1231 Encounter for screening mammogram for malignant neoplasm of breast: Secondary | ICD-10-CM

## 2021-07-26 ENCOUNTER — Telehealth: Payer: Self-pay | Admitting: Student

## 2021-07-26 NOTE — Telephone Encounter (Signed)
Inform patient her mammogram results are back and negative.  No evidence of malignancy.

## 2021-07-27 ENCOUNTER — Other Ambulatory Visit: Payer: Self-pay

## 2021-07-27 ENCOUNTER — Ambulatory Visit (HOSPITAL_COMMUNITY): Payer: Medicare HMO

## 2021-07-27 ENCOUNTER — Encounter (HOSPITAL_COMMUNITY): Payer: Self-pay

## 2021-08-03 DIAGNOSIS — H524 Presbyopia: Secondary | ICD-10-CM | POA: Diagnosis not present

## 2021-08-03 DIAGNOSIS — Z0100 Encounter for examination of eyes and vision without abnormal findings: Secondary | ICD-10-CM | POA: Diagnosis not present

## 2021-08-06 ENCOUNTER — Telehealth: Payer: Self-pay

## 2021-08-06 NOTE — Progress Notes (Signed)
[facility] PREP Weekly Session  Patient Details  Name: Morgan Collins MRN: [id number] Date of Birth: 1955/12/27 Age: 66 y.o. PCP: Alen Bleacher, MD  Vitals:   08/06/21 1251  Weight: 255 lb (115.7 kg)     [facility] Weekly seesion - 08/06/21 1200       [facility] "PREP" Location   [facility] "PREP" Location [facility]      Weekly Session   Topic Discussed Calorie breakdown   Membership talk by [employer]; Plan to next 90 days after this program ends; importance of carbs/fats/protein/quality and nutrient dense foods   Minutes exercised this week 60 minutes    Classes attended to date [place] 08/06/2021, 12:52 PM

## 2021-08-07 ENCOUNTER — Other Ambulatory Visit: Payer: Self-pay

## 2021-08-07 ENCOUNTER — Ambulatory Visit (HOSPITAL_COMMUNITY): Payer: Medicare HMO | Attending: Cardiovascular Disease

## 2021-08-07 DIAGNOSIS — E782 Mixed hyperlipidemia: Secondary | ICD-10-CM | POA: Insufficient documentation

## 2021-08-07 DIAGNOSIS — G4733 Obstructive sleep apnea (adult) (pediatric): Secondary | ICD-10-CM | POA: Insufficient documentation

## 2021-08-07 DIAGNOSIS — I639 Cerebral infarction, unspecified: Secondary | ICD-10-CM | POA: Insufficient documentation

## 2021-08-07 DIAGNOSIS — I10 Essential (primary) hypertension: Secondary | ICD-10-CM | POA: Diagnosis not present

## 2021-08-07 DIAGNOSIS — G459 Transient cerebral ischemic attack, unspecified: Secondary | ICD-10-CM | POA: Diagnosis not present

## 2021-08-07 DIAGNOSIS — I6389 Other cerebral infarction: Secondary | ICD-10-CM | POA: Diagnosis not present

## 2021-08-07 LAB — ECHOCARDIOGRAM COMPLETE BUBBLE STUDY
Area-P 1/2: 3.49 cm2
MV M vel: 6.13 m/s
MV Peak grad: 150.3 mmHg
S' Lateral: 3.33 cm

## 2021-08-07 MED ORDER — SODIUM CHLORIDE 0.9% FLUSH: 10.0000 mL | INTRAVENOUS | Status: AC | PRN

## 2021-08-07 MED ORDER — PERFLUTREN LIPID MICROSPHERE
1.0000 mL | INTRAVENOUS | Status: AC | PRN
  Administered 2021-08-07: 2 mL via INTRAVENOUS

## 2021-08-09 NOTE — Telephone Encounter (Signed)
error 

## 2021-08-12 ENCOUNTER — Other Ambulatory Visit: Payer: Self-pay | Admitting: Student

## 2021-08-12 DIAGNOSIS — I6389 Other cerebral infarction: Secondary | ICD-10-CM

## 2021-08-13 NOTE — Progress Notes (Signed)
[facility] PREP Weekly Session  Patient Details  Name: Morgan Collins MRN: [id number] Date of Birth: December 15, 1955 Age: 66 y.o. PCP: Alen Bleacher, MD  Vitals:   08/13/21 1543  Weight: 250 lb 3.2 oz (113.5 kg)     [facility] Weekly seesion - 08/13/21 1500       [facility] "PREP" Location   [facility] "PREP" Location [facility]      Weekly Session   Topic Discussed Hitting roadblocks   Reviewed goals and activity plan for next 90 days after PRPE classes end; to bring to final assessment visit next week.   Classes attended to date [place] 08/13/2021, 3:44 PM

## 2021-08-20 NOTE — Progress Notes (Signed)
[facility] PREP Weekly Session ? ?Patient Details  ?Name: Morgan Collins ?MRN: [id number] ?Date of Birth: [birth year] ?Age: 66 y.o. ?PCP: Alen Bleacher, MD ? ?There were no vitals filed for this visit. ? ? [facility] Weekly seesion - 08/20/21 1200   ? ?  ? [facility] "PREP" Location  ? [facility]PREP[facility]   ?  ? Weekly Session  ? Topic Discussed Other   Fit testing completed, final assessment visit scheduled for Wednesday; to bring goals and acitivity plan and PREP survey to final visit.  ? ?  ?  ? ?  ? ? ?Morgan Collins ?08/20/2021, 12:33 PM ? ? ?

## 2021-08-22 NOTE — Progress Notes (Signed)
[facility] PREP Evaluation ? ?Patient Details  ?Name: Morgan Collins ?MRN: [id number] ?Date of Birth: Jan 06, 1956 ?Age: 66 y.o. ?PCP: Alen Bleacher, MD ? ?Vitals:  ? 08/22/21 1127  ?BP: 122/80  ?Pulse: 82  ?SpO2: 97%  ?Weight: 253 lb (114.8 kg)  ? ? ? [facility] Eval - 08/22/21 1100   ? ?  ? [facility] "PREP" Location  ? [facility]PREP[facility]   ?  ? Referral   ? Program Start Date 08/22/21   Program end date  ?  ? Measurement  ? Waist Circumference 49 inches   ? Hip Circumference 55 inches   ? Body fat --   E4  ?  ? Mobility and Daily Activities  ? I find it easy to walk up or down two or more flights of stairs. 3   ? I have no trouble taking out the trash. 4   ? I do housework such as vacuuming and dusting on my own without difficulty. 4   ? I can easily lift a gallon of milk (8lbs). 4   ? I can easily walk a mile. 4   ? I have no trouble reaching into high cupboards or reaching down to pick up something from the floor. 1   ? 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 2   ?  ? Mobility and Daily Activities  ? I feel younger than my age. 2   ? I feel independent. 4   ? I feel energetic. 2   ? I live an active life.  2   ? I feel strong. 2   ? I feel healthy. 2   ? I feel active as other people my age. 3   ?  ? How fit and strong are you.  ? Fit and Strong Total Score 39   ? ?  ?  ? ?  ? ?Past Medical History:  ?Diagnosis Date  ? Complication of anesthesia   ? encephalitis in coma 23 days- trach -reversed-no issues now  ? Depressed   ? Diabetes mellitus without complication ([place])   ? type 2  ? High cholesterol   ? History of colonic polyps 04/10/2018  ? Her colonoscopy in 2016 revealed multiple polyps.  Per patient there were found to be benign.  She was told to follow-up for repeat colonoscopy in 3 to 5 years.  ? Hypertension   ? Hypothyroidism   ? Nontraumatic subcortical hemorrhage of cerebral hemisphere ([place]) 03/08/2014  ? Obstructive hydrocephalus ([place])  07/27/2013  ? Sleep apnea   ? do not use cpap  ? Stroke [facility]) 2015  ? slower ,but not a problem; right thalamic hemorrhage 07/2013  ? Thalamic hemorrhage ([place]) 08/04/2013  ? Thyroid disease   ? ?Past Surgical History:  ?Procedure Laterality Date  ? ABDOMINAL HYSTERECTOMY    ? APPENDECTOMY    ? c sections    ? COLONOSCOPY WITH PROPOFOL N/A 12/30/2014  ? Procedure: COLONOSCOPY WITH PROPOFOL;  Surgeon: Carol Ada, MD;  [facility];  Service: Endoscopy;  Laterality: N/A;  ? COLONOSCOPY WITH PROPOFOL N/A 07/03/2018  ? Procedure: COLONOSCOPY WITH PROPOFOL;  Surgeon: Carol Ada, MD;  [facility];  Service: Endoscopy;  Laterality: N/A;  ? PARATHYROIDECTOMY Right 02/18/2017  ? Procedure: RIGHT INFERIOR PARATHYROIDECTOMY;  Surgeon: Armandina Gemma, MD;  Location: [place];  Service: General;  Laterality: Right;  ? POLYPECTOMY  07/03/2018  ? Procedure: POLYPECTOMY;  Surgeon:  Carol Ada, MD;  Location: [facility];  Service: Endoscopy;;  ? TRACHEOSTOMY    ? past history of early 20's- encephalitis coma for 23 days-1982  ? ?Social History  ? ?Tobacco Use  ?Smoking Status Never  ?Smokeless Tobacco Never  ?PREP Class Final Assessment visit: ?How fit and strong survey: ?05/23/21: 34 ?08/22/21: 39 ?Education sessions completed: 9 ?Workout sessions completed: 9 ? ? ?[place] ?08/22/2021, 11:30 AM ? ? ?

## 2021-08-31 ENCOUNTER — Telehealth: Payer: Self-pay

## 2021-08-31 NOTE — Telephone Encounter (Signed)
Error

## 2021-09-04 ENCOUNTER — Telehealth: Payer: Self-pay | Admitting: Student

## 2021-09-04 NOTE — Telephone Encounter (Signed)
Spoke with patient on the phone. Informed patient the cardiology group called her but unable to reach her. They said her EKG showed no PAF or arhythmia. Informed patient we can reorder her sleep study since the last one didn't have an order with titration or we can do an autopap machine. Patient said she will be ok with repeating the sleep study to get a titration. Informed patient I will place an order to repeat the sleep study and she is amendable to plan.  ?

## 2021-09-05 ENCOUNTER — Other Ambulatory Visit: Payer: Self-pay | Admitting: Student

## 2021-09-05 DIAGNOSIS — G4733 Obstructive sleep apnea (adult) (pediatric): Secondary | ICD-10-CM

## 2021-09-06 DIAGNOSIS — R351 Nocturia: Secondary | ICD-10-CM | POA: Diagnosis not present

## 2021-09-06 DIAGNOSIS — R310 Gross hematuria: Secondary | ICD-10-CM | POA: Diagnosis not present

## 2021-09-06 DIAGNOSIS — N3946 Mixed incontinence: Secondary | ICD-10-CM | POA: Diagnosis not present

## 2021-09-07 NOTE — Progress Notes (Signed)
Schedule sleep study for OSA ?

## 2021-09-17 ENCOUNTER — Other Ambulatory Visit: Payer: Self-pay | Admitting: Student

## 2021-09-17 DIAGNOSIS — I10 Essential (primary) hypertension: Secondary | ICD-10-CM

## 2021-09-25 DIAGNOSIS — R310 Gross hematuria: Secondary | ICD-10-CM | POA: Diagnosis not present

## 2021-09-25 DIAGNOSIS — R319 Hematuria, unspecified: Secondary | ICD-10-CM | POA: Diagnosis not present

## 2021-09-25 DIAGNOSIS — I700 Atherosclerosis of aorta: Secondary | ICD-10-CM | POA: Diagnosis not present

## 2021-10-03 DIAGNOSIS — R310 Gross hematuria: Secondary | ICD-10-CM | POA: Diagnosis not present

## 2021-10-12 ENCOUNTER — Other Ambulatory Visit: Payer: Self-pay | Admitting: Student

## 2021-10-12 DIAGNOSIS — D508 Other iron deficiency anemias: Secondary | ICD-10-CM

## 2021-10-16 ENCOUNTER — Other Ambulatory Visit: Payer: Self-pay | Admitting: Student

## 2021-10-16 DIAGNOSIS — I6389 Other cerebral infarction: Secondary | ICD-10-CM

## 2021-11-19 ENCOUNTER — Telehealth: Payer: Self-pay | Admitting: Student

## 2021-11-19 NOTE — Telephone Encounter (Signed)
Called patient to inform her the repeat sleep study was denies by her insurance  and next plan is to send a referral to pulmonology to order her CPAP with auto setting. Was unable to reach patient but left a generic voicemail with plan to call back.

## 2021-11-20 ENCOUNTER — Encounter: Payer: Self-pay | Admitting: *Deleted

## 2021-11-22 ENCOUNTER — Other Ambulatory Visit: Payer: Self-pay | Admitting: Student

## 2021-12-03 ENCOUNTER — Other Ambulatory Visit: Payer: Self-pay | Admitting: Internal Medicine

## 2021-12-03 DIAGNOSIS — Z1231 Encounter for screening mammogram for malignant neoplasm of breast: Secondary | ICD-10-CM

## 2021-12-04 ENCOUNTER — Ambulatory Visit
Admission: RE | Admit: 2021-12-04 | Discharge: 2021-12-04 | Disposition: A | Discharge status: Home / Self Care | Payer: Medicare HMO | Source: Ambulatory Visit | Attending: Internal Medicine | Admitting: Internal Medicine

## 2021-12-04 ENCOUNTER — Other Ambulatory Visit: Payer: Self-pay | Admitting: Family Medicine

## 2021-12-04 DIAGNOSIS — R52 Pain, unspecified: Secondary | ICD-10-CM

## 2021-12-11 NOTE — Progress Notes (Signed)
[facility] [address] [place]. [place] [address] [phone]       STROKE FOLLOW UP NOTE  Morgan Collins Date of Birth:  [birth year] Medical Record Number:  [id number]   Reason for Referral: stroke follow up    SUBJECTIVE:   CHIEF COMPLAINT:  Chief Complaint  Patient presents with   Infarction    Rm 3, 6 month FU  "no new concerns"    HPI:   Update 12/12/2021 JM: Patient returns for 17-monthstroke follow-up.  Overall stable without new or reoccurring stroke/TIA symptoms.  Compliant on Plavix and atorvastatin and Zetia, denies side effects.  Blood pressure today 133/88.  Completed cardiac monitor which showed no concerning arrhythmias.  She continues to have difficulty obtaining CPAP machine. Has since establish care with new PCP who advised her that she will need to repeat sleep study as her prior sleep study 6 months ago but patient reluctant to do so.  Previously seen by Dr. YAnnamaria Bootspulmonology  with completion of sleep study in 04/2021 which showed severe sleep apnea.  She has been working on weight loss with approx 10 lb loss since prior visit.  No further concerns at this time.    History provided for reference purposes only Initial visit 06/14/2021 JM:  Patient being seen for initial hospital follow-up unaccompanied.  Overall stable.  Denies new or reoccurring stroke/TIA symptoms.  Remains on aspirin as well as atorvastatin and Zetia. She was only supplied a 3-week duration of Plavix at recent discharge and currently waiting for a new prescription supplied by PCP - she plans on restarting plavix and stopping aspirin once she picks this up.  Is awaiting further evaluation with urology for new onset of hematuria.  Blood pressure today 133/82.  She has not yet initiated CPAP - she reports never receiving a call regarding the results and unsure who to call to obtain these. She is concerned that she was never call to do cardiac monitor - upon review of  discharge notes, they recommended order be placed at follow-up for unknown reason.  No further concerns at this time  Stroke admission 05/11/2021 VTIERRE GERARDis a 66year old female with history of morbid obesity, hypertension, hyperlipidemia, diabetes, history of ICH in 07/2013 who presented on 05/11/2021 for 1 mo onset of diplopia. Workup outpatient and after MRI showed acute/subacute stroke, sent to ED by her ophthalmologist Dr. GKaty Fitch  Personally reviewed hospitalization pertinent progress notes, lab work and imaging.  Evaluated by Dr. XErlinda Hong- MRI showed right frontal parietal stroke felt to be incidental finding as not related to symptoms of diplopia which may have been from small brainstem stroke no longer visible on MRI. MRI also showed small chronic lacunar infarcts in the right centrum semiovale and chronic hemorrhages in the right thalamus and left occipital lobe.  CTA head/neck negative LVO.  LDL 98.  A1c 7.1.  CRP 0.7.  ESR 57.  Etiology for acute infarct possibly embolic vs small vessel disease secondary to hypertension.  Recommended 30-day cardiac event monitoring outpatient to rule out A. fib.  Recommended DAPT for 3 weeks and Plavix alone.  HTN stable.  Added Zetia in addition to atorvastatin 80 mg daily and to ensure follow-up with PCP for both uncontrolled HLD and DM.  Recently completed sleep study which preceded diplopia complaints - dx'd with severe OSA - advised f/u for recommended titration study.  PT/OT no therapy needs and discharged home.        PERTINENT IMAGING  Per hospital follow-up CT head No acute abnormality.  CTA head & neck No flow limiting stenosis of major vessels, atherosclerosis and 70m bulge from superior right M1 segment representing aneurysm or infundibulum MRI  small acute/subacute infarct in right frontoparietal region, small chronic lacunar infarct in right centrum seimovale, chronic hemorrhages in right thalamus and left occipital lobe CRP 0.7 ESR  57 LDL 98 HgbA1c 7.1    ROS:   14 system review of systems performed and negative with exception of those listed in HPI  PMH:  Past Medical History:  Diagnosis Date   Complication of anesthesia    encephalitis in coma 23 days- trach -reversed-no issues now   Depressed    Diabetes mellitus without complication ([place] [place]    type 2   High cholesterol    History of colonic polyps 04/10/2018   Her colonoscopy in 2016 revealed multiple polyps.  Per patient there were found to be benign.  She was told to follow-up for repeat colonoscopy in 3 to 5 years.   Hypertension    Hypothyroidism    Nontraumatic subcortical hemorrhage of cerebral hemisphere ([facility] [place] 03/08/2014   Ob[place]ive [place]us ([place] 07/27/2013   Sleep apnea    do not use cpap   Stroke ([facility] 2015   slower ,but not a problem; right thalamic hemorrhage 07/2013   Thalamic hemorrhage ([facility] 08/04/2013   Thyroid disease     PSH:  Past Surgical History:  Procedure Laterality Date   ABDOMINAL HYSTERECTOMY     APPENDECTOMY     c sections     COLONOSCOPY WITH PROPOFOL N/A 12/30/2014   Procedure: COLONOSCOPY WITH PROPOFOL;  Surgeon: PCarol Ada MD;  Location: [facility];  Service: Endoscopy;  Laterality: N/A;   COLONOSCOPY WITH PROPOFOL N/A 07/03/2018   Procedure: COLONOSCOPY WITH PROPOFOL;  Surgeon: HCarol Ada MD;  Location: [facility];  Service: Endoscopy;  Laterality: N/A;   PARATHYROIDECTOMY Right 02/18/2017   Procedure: RIGHT INFERIOR PARATHYROIDECTOMY;  Surgeon: GArmandina Gemma MD;  [facility]: [facility]  Service: General;  Laterality: Right;   POLYPECTOMY  07/03/2018   Procedure: POLYPECTOMY;  Surgeon: HCarol Ada MD;  Location: [facility];  Service: Endoscopy;;   TRACHEOSTOMY     past history of early 20's- encephalitis coma for 23 days-1982    Social History:  Social History   Socioeconomic History   Marital status: Single    Spouse name: Not on file   Number of children: 2   Years of education: college    Highest education level: Not on file  Occupational History   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    Employer: [employer] Tobacco Use   Smoking status: Never   Smokeless tobacco: Never  Vaping Use   Vaping Use: Never used  Substance and Sexual Activity   Alcohol use: No   Drug use: No   Sexual activity: Not Currently  Other Topics Concern   Not on file  Social History Narrative         Current Social History 04/15/2017           Patient lives with daughter, JVerdie Drownin one level home 04/15/2017   Transportation: Patient has own vehicle and drives herself [id number]  Important Relationships "My family, sister, nieces and nephews and friend 04/15/2017    Pets: None 04/15/2017   Education / Work:  One year of college/ [employer] 04/15/2017   Interests / Fun: "Take care of everyone else." 04/15/2017   Current Stressors: "I want to be on my  own (not living with daughter)" 04/15/2017   Religious / Personal Beliefs: I believe in God 04/15/2017   Other: "I do for others before taking care of myself." 04/15/2017   L. Ducatte, RN, BSN                                                                                                 Social Determinants of Health   Financial Resource Strain: Not on file  Food Insecurity: Not on file  Transportation Needs: Not on file  Physical Activity: Not on file  Stress: Not on file  Social Connections: Not on file  Intimate Partner Violence: Not on file    Family History:  Family History  Problem Relation Age of Onset   Congestive Heart Failure Mother    Hypertension Mother    Alcohol abuse Father    Cancer Sister        Breast   Dementia Brother     Medications:   Current Outpatient Medications on File Prior to Visit  Medication Sig Dispense Refill   atorvastatin (LIPITOR) 80 MG tablet Take 1 tablet (80 mg total) by mouth daily. 90 tablet 1   citalopram (CELEXA) 40 MG tablet Take 0.5 tablets (20 mg total) by mouth every  evening. 60 tablet 2   clopidogrel (PLAVIX) 75 MG tablet TAKE 1 TABLET BY MOUTH EVERY DAY 30 tablet 1   Coenzyme Q10 (MEGA COQ10 PO) Take 2 capsules by mouth daily.     Cyanocobalamin (B-12 PO) Take 1 tablet by mouth daily.     ECHINACEA PO Take 1 capsule by mouth daily.     ezetimibe (ZETIA) 10 MG tablet TAKE 1 TABLET BY MOUTH EVERY DAY 30 tablet 2   ferrous sulfate (FERROUSUL) 325 (65 FE) MG tablet Take 1 tablet (325 mg total) by mouth daily with breakfast. 30 tablet 2   hydrochlorothiazide (MICROZIDE) 12.5 MG capsule TAKE 1 CAPSULE BY MOUTH EVERY DAY 30 capsule 2   levothyroxine (SYNTHROID) 112 MCG tablet TAKE 1 TABLET IN THE MORNING 30 MIN BEFORE FOOD 30 tablet 2   losartan (COZAAR) 100 MG tablet Take 1 tablet (100 mg total) by mouth daily. 90 tablet 3   metFORMIN (GLUCOPHAGE) 500 MG tablet Take 1 tablet (500 mg total) by mouth 2 (two) times daily with a meal. 180 tablet 3   Current Facility-Administered Medications on File Prior to Visit  Medication Dose Route Frequency Provider Last Rate Last Admin   sodium chloride flush (NS) 0.9 % injection 10 mL  10 mL Intravenous PRN Josue Hector, MD        Allergies:  No Known Allergies    OBJECTIVE:  Physical Exam  Vitals:   12/12/21 1317  BP: 133/88  Pulse: 87  Weight: 246 lb (111.6 kg)  Height: '5\' 1"'  (1.549 m)   Body mass index is 46.48 kg/m. No results found.   General: Morbidly obese very pleasant middle-aged African-American female, seated, in no evident distress Head: head normocephalic and atraumatic.   Neck: supple with no carotid or supraclavicular bruits Cardiovascular: regular rate and rhythm, no murmurs Musculoskeletal: no  deformity Skin:  no rash/petichiae Vascular:  Normal pulses all extremities   Neurologic Exam Mental Status: Awake and fully alert.  Fluent speech and language.  Oriented to place and time. Recent and remote memory intact. Attention span, concentration and fund of knowledge appropriate. Mood  and affect appropriate.  Cranial Nerves: Pupils equal, briskly reactive to light. Extraocular movements full without nystagmus. Visual fields full to confrontation. Hearing intact. Facial sensation intact. Face, tongue, palate moves normally and symmetrically.  Motor: Normal bulk and tone. Normal strength in all tested extremity muscles Sensory.: intact to touch , pinprick , position and vibratory sensation.  Coordination: Rapid alternating movements normal in all extremities. Finger-to-nose and heel-to-shin performed accurately bilaterally. Gait and Station: Arises from chair without difficulty. Stance is normal. Gait demonstrates normal stride length and balance without use of assistive device.  Difficulty performing tandem walk and heel toe (due to body habitus).  Reflexes: 1+ and symmetric. Toes downgoing.          ASSESSMENT: Morgan Collins is a 66 y.o. year old female with recent incidental finding of acute/subacute right frontal parietal white matter small infarct secondary to SVD vs possibly embolic found on MRI on 57/32/2025 during evaluation for 1 mo onset of diplopia - stroke location does not explain diplopia symptoms -possibly small brainstem infarct no longer visualized on MRI. Vascular risk factors include hx of ICH/IVH 2015 s/p EVD, morbid obesity (BMI 48), new dx of severe OSA, HTN, HLD, DM and prior stroke on imaging.      PLAN:  R frontal WM stroke :  No residual deficit.   Continue Plavix, atorvastatin 80 mg daily and Zetia for secondary stroke prevention.  All medications managed by PCP Cardiac monitor negative for A-fib Discussed secondary stroke prevention measures and importance of close PCP follow up for aggressive stroke risk factor management including BP goal<130/90, HLD with LDL goal<70 and DM with A1c.<7.  Stroke labs 04/2021: LDL 98, A1c 7.1 I have gone over the pathophysiology of stroke, warning signs and symptoms, risk factors and their management in  some detail with instructions to go to the closest emergency room for symptoms of concern.  Severe OSA: Initial diagnosis by Dr. Annamaria Boots pulmonology 04/2021. Discussed importance of treating severe apnea as untreated apnea increases risk of additional strokes and cardiovascular disease.  Do not see  a clear indication for repeat sleep study as prior sleep study 7 months ago as advised by PCP unless this is a specific insurance requirement - looks like attempted to repeat back in March to pursue titration but insurance denied. Referral placed to pulmonology to initiate likely AutoPap and ongoing monitoring/management    Doing well from stroke standpoint without further recommendations and risk factors are managed by PCP. She may follow up PRN, as usual for our patients who are strictly being followed for stroke. If any new neurological issues should arise, request PCP place referral for evaluation by one of our neurologists. Thank you.     CC:  PCP: Antony Blackbird, MD    I spent 34 minutes of face-to-face and non-face-to-face time with patient.  This included previsit chart review, lab review, study review, order entry, electronic health record documentation, patient education regarding prior stroke and, secondary stroke prevention measures and importance of managing stroke risk factors, importance of apnea treatment and answered all other questions to patient satisfaction  Frann Rider, [facility]  [facility] [address] [place], [place] [address]  Phone [phone] Fax [phone] Note: This  document was prepared with digital dictation and possible smart phrase technology. Any transcriptional errors that result from this process are unintentional.

## 2021-12-12 ENCOUNTER — Encounter: Payer: Self-pay | Admitting: Adult Health

## 2021-12-12 ENCOUNTER — Ambulatory Visit (INDEPENDENT_AMBULATORY_CARE_PROVIDER_SITE_OTHER): Payer: Medicare HMO | Admitting: Adult Health

## 2021-12-12 VITALS — BP 133/88 | HR 87 | Ht 61.0 in | Wt 246.0 lb

## 2021-12-12 DIAGNOSIS — G4730 Sleep apnea, unspecified: Secondary | ICD-10-CM | POA: Diagnosis not present

## 2021-12-12 DIAGNOSIS — I6389 Other cerebral infarction: Secondary | ICD-10-CM | POA: Diagnosis not present

## 2021-12-12 DIAGNOSIS — Z8673 Personal history of transient ischemic attack (TIA), and cerebral infarction without residual deficits: Secondary | ICD-10-CM

## 2021-12-12 NOTE — Patient Instructions (Signed)
Please contact your PCP Dr. Adah Salvage regarding referral to pulmonology to obtain CPAP machine   Continue Plavix, atorvastatin and Zetia for secondary stroke prevention measures - all will be refilled/managed by your PCP  Continue to follow with PCP for aggressive stroke risk factor management     As you have been stable from stroke standpoint, recommend follow-up on an as-needed basis

## 2021-12-22 ENCOUNTER — Other Ambulatory Visit: Payer: Self-pay | Admitting: Student

## 2021-12-22 DIAGNOSIS — I10 Essential (primary) hypertension: Secondary | ICD-10-CM

## 2022-01-21 ENCOUNTER — Other Ambulatory Visit: Payer: Self-pay | Admitting: Student

## 2022-01-21 DIAGNOSIS — D508 Other iron deficiency anemias: Secondary | ICD-10-CM

## 2022-01-22 ENCOUNTER — Other Ambulatory Visit: Payer: Self-pay | Admitting: Gastroenterology

## 2022-02-01 ENCOUNTER — Other Ambulatory Visit: Payer: Self-pay | Admitting: Student

## 2022-04-22 ENCOUNTER — Other Ambulatory Visit: Payer: Self-pay | Admitting: Student

## 2022-04-22 DIAGNOSIS — I6389 Other cerebral infarction: Secondary | ICD-10-CM

## 2022-06-07 ENCOUNTER — Other Ambulatory Visit: Payer: Self-pay | Admitting: Student

## 2022-06-07 DIAGNOSIS — I6389 Other cerebral infarction: Secondary | ICD-10-CM

## 2022-07-10 ENCOUNTER — Other Ambulatory Visit: Payer: Self-pay | Admitting: Family

## 2022-07-10 ENCOUNTER — Ambulatory Visit
Admission: RE | Admit: 2022-07-10 | Discharge: 2022-07-10 | Disposition: A | Discharge status: Home / Self Care | Payer: Medicare PPO | Source: Ambulatory Visit | Attending: Family | Admitting: Family

## 2022-07-10 DIAGNOSIS — R059 Cough, unspecified: Secondary | ICD-10-CM

## 2022-07-22 ENCOUNTER — Ambulatory Visit
Admission: RE | Admit: 2022-07-22 | Discharge: 2022-07-22 | Disposition: A | Discharge status: Home / Self Care | Payer: Medicare PPO | Source: Ambulatory Visit | Attending: Internal Medicine | Admitting: Internal Medicine

## 2022-07-22 DIAGNOSIS — Z1231 Encounter for screening mammogram for malignant neoplasm of breast: Secondary | ICD-10-CM

## 2022-07-25 ENCOUNTER — Encounter (HOSPITAL_COMMUNITY): Payer: Self-pay | Admitting: Gastroenterology

## 2022-07-25 NOTE — Progress Notes (Signed)
Attempted to obtain medical history via telephone, unable to reach at this time. HIPAA compliant voicemail message left requesting return call to pre surgical testing department. 

## 2022-08-02 ENCOUNTER — Encounter (HOSPITAL_COMMUNITY): Payer: Self-pay | Admitting: Gastroenterology

## 2022-08-02 ENCOUNTER — Other Ambulatory Visit: Payer: Self-pay

## 2022-08-02 ENCOUNTER — Encounter (HOSPITAL_COMMUNITY)
Admission: RE | Disposition: A | Discharge status: Home / Self Care | Payer: Self-pay | Source: Home / Self Care | Attending: Gastroenterology

## 2022-08-02 ENCOUNTER — Ambulatory Visit (HOSPITAL_COMMUNITY)
Admission: RE | Admit: 2022-08-02 | Discharge: 2022-08-02 | Disposition: A | Discharge status: Home / Self Care | Payer: Medicare PPO | Attending: Gastroenterology | Admitting: Gastroenterology

## 2022-08-02 ENCOUNTER — Ambulatory Visit (HOSPITAL_BASED_OUTPATIENT_CLINIC_OR_DEPARTMENT_OTHER): Payer: Medicare PPO | Admitting: Certified Registered Nurse Anesthetist

## 2022-08-02 ENCOUNTER — Ambulatory Visit (HOSPITAL_COMMUNITY): Payer: Medicare PPO | Admitting: Certified Registered Nurse Anesthetist

## 2022-08-02 DIAGNOSIS — Z7984 Long term (current) use of oral hypoglycemic drugs: Secondary | ICD-10-CM | POA: Insufficient documentation

## 2022-08-02 DIAGNOSIS — Z7902 Long term (current) use of antithrombotics/antiplatelets: Secondary | ICD-10-CM | POA: Insufficient documentation

## 2022-08-02 DIAGNOSIS — E039 Hypothyroidism, unspecified: Secondary | ICD-10-CM

## 2022-08-02 DIAGNOSIS — E119 Type 2 diabetes mellitus without complications: Secondary | ICD-10-CM | POA: Insufficient documentation

## 2022-08-02 DIAGNOSIS — Z1211 Encounter for screening for malignant neoplasm of colon: Secondary | ICD-10-CM | POA: Insufficient documentation

## 2022-08-02 DIAGNOSIS — Z86010 Personal history of colonic polyps: Secondary | ICD-10-CM

## 2022-08-02 DIAGNOSIS — G4730 Sleep apnea, unspecified: Secondary | ICD-10-CM | POA: Diagnosis not present

## 2022-08-02 DIAGNOSIS — I10 Essential (primary) hypertension: Secondary | ICD-10-CM

## 2022-08-02 DIAGNOSIS — Z8249 Family history of ischemic heart disease and other diseases of the circulatory system: Secondary | ICD-10-CM | POA: Diagnosis not present

## 2022-08-02 DIAGNOSIS — D123 Benign neoplasm of transverse colon: Secondary | ICD-10-CM | POA: Diagnosis not present

## 2022-08-02 DIAGNOSIS — D122 Benign neoplasm of ascending colon: Secondary | ICD-10-CM

## 2022-08-02 DIAGNOSIS — E890 Postprocedural hypothyroidism: Secondary | ICD-10-CM | POA: Insufficient documentation

## 2022-08-02 HISTORY — PX: POLYPECTOMY: SHX5525

## 2022-08-02 HISTORY — PX: COLONOSCOPY WITH PROPOFOL: SHX5780

## 2022-08-02 LAB — GLUCOSE, CAPILLARY: Glucose-Capillary: 128 mg/dL — ABNORMAL HIGH (ref 70–99)

## 2022-08-02 SURGERY — COLONOSCOPY WITH PROPOFOL
Anesthesia: Monitor Anesthesia Care

## 2022-08-02 MED ORDER — SODIUM CHLORIDE 0.9 % IV SOLN: INTRAVENOUS | Status: DC

## 2022-08-02 MED ORDER — PHENYLEPHRINE 80 MCG/ML (10ML) SYRINGE FOR IV PUSH (FOR BLOOD PRESSURE SUPPORT)
PREFILLED_SYRINGE | INTRAVENOUS | Status: DC | PRN
  Administered 2022-08-02: 80 ug via INTRAVENOUS

## 2022-08-02 MED ORDER — LACTATED RINGERS IV SOLN: INTRAVENOUS | Status: DC

## 2022-08-02 MED ORDER — PROPOFOL 500 MG/50ML IV EMUL
INTRAVENOUS | Status: DC | PRN
  Administered 2022-08-02: 125 ug/kg/min via INTRAVENOUS

## 2022-08-02 MED ORDER — PROPOFOL 10 MG/ML IV BOLUS
INTRAVENOUS | Status: DC | PRN
  Administered 2022-08-02: 20 mg via INTRAVENOUS

## 2022-08-02 MED ORDER — LIDOCAINE 2% (20 MG/ML) 5 ML SYRINGE
INTRAMUSCULAR | Status: DC | PRN
  Administered 2022-08-02: 100 mg via INTRAVENOUS

## 2022-08-02 SURGICAL SUPPLY — 22 items

## 2022-08-02 NOTE — Op Note (Signed)
[name] Patient Name: Morgan Collins Procedure Date: 08/02/2022 MRN: [id number] Attending MD: Carol Ada , MD, [id number] Date of Birth: 03/16/1956 CSN: [id number] Age: 67 Admit Type: Outpatient Procedure:                Colonoscopy Indications:              High risk colon cancer surveillance: Personal                            history of colonic polyps Providers:                Carol Ada, MD, Carlyn Reichert, RN, William Dalton, Technician, Tyrone Apple, Technician,                            Christell Faith, CRNA Referring MD:              Medicines:                Propofol per Anesthesia Complications:            No immediate complications. Estimated Blood Loss:     Estimated blood loss: none. Procedure:                Pre-Anesthesia Assessment:                           - Prior to the procedure, a History and Physical                            was performed, and patient medications and                            allergies were reviewed. The patient's tolerance of                            previous anesthesia was also reviewed. The risks                            and benefits of the procedure and the sedation                            options and risks were discussed with the patient.                            All questions were answered, and informed consent                            was obtained. Prior Anticoagulants: The patient has                            taken no anticoagulant or antiplatelet agents. ASA                            Grade Assessment: III -  A patient with severe                            systemic disease. After reviewing the risks and                            benefits, the patient was deemed in satisfactory                            condition to undergo the procedure.                           - Sedation was administered by an anesthesia                            professional. Deep sedation was  attained.                           After obtaining informed consent, the colonoscope                            was passed under direct vision. Throughout the                            procedure, the patient's blood pressure, pulse, and                            oxygen saturations were monitored continuously. The                            [id number] [id number]) Olympus colonoscope was                            introduced through the anus and advanced to the the                            cecum, identified by appendiceal orifice and                            ileocecal valve. The colonoscopy was performed                            without difficulty. The patient tolerated the                            procedure well. The quality of the bowel                            preparation was evaluated using the BBPS [facility]                            Bowel Preparation Scale) with scores of: Right                            Colon =  3, Transverse Colon = 3 and Left Colon = 3                            (entire mucosa seen well with no residual staining,                            small fragments of stool or opaque liquid). The                            total BBPS score equals 9. The ileocecal valve,                            appendiceal orifice, and rectum were photographed. Scope In: 10:39:01 AM Scope Out: 10:55:09 AM Scope Withdrawal Time: 0 hours 11 minutes 3 seconds  Total Procedure Duration: 0 hours 16 minutes 8 seconds  Findings:      Three sessile polyps were found in the transverse colon and ascending       colon. The polyps were 2 to 3 mm in size. These polyps were removed with       a cold snare. Resection and retrieval were complete. Impression:               - Three 2 to 3 mm polyps in the transverse colon                            and in the ascending colon, removed with a cold                            snare. Resected and retrieved. Moderate Sedation:      Not Applicable -  Patient had care per Anesthesia. Recommendation:           - Patient has a contact number available for                            emergencies. The signs and symptoms of potential                            delayed complications were discussed with the                            patient. Return to normal activities tomorrow.                            Written discharge instructions were provided to the                            patient.                           - Resume previous diet.                           - Continue present medications.                           -  Await pathology results.                           - Repeat colonoscopy in 5 years for surveillance. Procedure Code(s):        --- Professional ---                           [phone], Colonoscopy, flexible; with removal of                            tumor(s), polyp(s), or other lesion(s) by snare                            technique Diagnosis Code(s):        --- Professional ---                           D12.3, Benign neoplasm of transverse colon (hepatic                            flexure or splenic flexure)                           Z86.010, Personal history of colonic polyps                           D12.2, Benign neoplasm of ascending colon CPT copyright 2022 American Medical Association. All rights reserved. The codes documented in this report are preliminary and upon coder review may  be revised to meet current compliance requirements. Carol Ada, MD Carol Ada, MD 08/02/2022 11:00:26 AM This report has been signed electronically. Number of Addenda: 0

## 2022-08-02 NOTE — Anesthesia Preprocedure Evaluation (Signed)
Anesthesia Evaluation  Patient identified by MRN, date of birth, ID band Patient awake    Reviewed: Allergy & Precautions, NPO status , Patient's Chart, lab work & pertinent test results  Airway Mallampati: II       Dental no notable dental hx.    Pulmonary sleep apnea    Pulmonary exam normal        Cardiovascular hypertension, Pt. on medications  Rhythm:Regular Rate:Normal     Neuro/Psych    Depression    CVA    GI/Hepatic Neg liver ROS,,,screening   Endo/Other  diabetes, Type 2, Oral Hypoglycemic AgentsHypothyroidism    Renal/GU   negative genitourinary   Musculoskeletal negative musculoskeletal ROS (+)    Abdominal Normal abdominal exam  (+)   Peds  Hematology   Anesthesia Other Findings   Reproductive/Obstetrics                             Anesthesia Physical Anesthesia Plan  ASA: 3  Anesthesia Plan: MAC   Post-op Pain Management:    Induction: Intravenous  PONV Risk Score and Plan: Propofol infusion and Treatment may vary due to age or medical condition  Airway Management Planned: Natural Airway, Simple Face Mask and Nasal Cannula  Additional Equipment: None  Intra-op Plan:   Post-operative Plan:   Informed Consent: I have reviewed the patients History and Physical, chart, labs and discussed the procedure including the risks, benefits and alternatives for the proposed anesthesia with the patient or authorized representative who has indicated his/her understanding and acceptance.     Dental advisory given  Plan Discussed with: CRNA  Anesthesia Plan Comments:        Anesthesia Quick Evaluation

## 2022-08-02 NOTE — Anesthesia Procedure Notes (Signed)
Procedure Name: MAC Date/Time: 08/02/2022 10:32 AM  Performed by: West Pugh, CRNAPre-anesthesia Checklist: Patient identified, Emergency Drugs available, Suction available, Patient being monitored and Timeout performed Patient Re-evaluated:Patient Re-evaluated prior to induction Oxygen Delivery Method: Simple face mask Preoxygenation: Pre-oxygenation with 100% oxygen Induction Type: IV induction Number of attempts: 1 Placement Confirmation: positive ETCO2 Dental Injury: Teeth and Oropharynx as per pre-operative assessment

## 2022-08-02 NOTE — Discharge Instructions (Signed)

## 2022-08-02 NOTE — Transfer of Care (Signed)
Immediate Anesthesia Transfer of Care Note  Patient: Morgan Collins  Procedure(s) Performed: COLONOSCOPY WITH PROPOFOL POLYPECTOMY  Patient Location: PACU and Endoscopy Unit  Anesthesia Type:MAC  Level of Consciousness: awake and patient cooperative  Airway & Oxygen Therapy: Patient Spontanous Breathing and Patient connected to nasal cannula oxygen  Post-op Assessment: Report given to RN and Post -op Vital signs reviewed and stable  Post vital signs: Reviewed and stable  Last Vitals:  Vitals Value Taken Time  BP 115/44 08/02/22 1102  Temp 36.3 C 08/02/22 1102  Pulse 97 08/02/22 1103  Resp 27 08/02/22 1103  SpO2 99 % 08/02/22 1103  Vitals shown include unvalidated device data.  Last Pain:  Vitals:   08/02/22 1102  TempSrc: Temporal  PainSc:          Complications: No notable events documented.

## 2022-08-02 NOTE — H&P (Signed)
Morgan Collins HPI: At this time the patient denies any problems with nausea, vomiting, fevers, chills, abdominal pain, diarrhea, constipation, hematochezia, melena, GERD, or dysphagia. The patient denies any known family history of colon cancers. No complaints of chest pain, SOB, MI, or sleep apnea.  This past November Morgan Collins was diagnosed with a TIA and Morgan Collins is well at this titme.  The patient was started on Plavix.  Her colonoscopy in 2020 was positive for 4 adenomas.   Past Medical History:  Diagnosis Date   Complication of anesthesia    encephalitis in coma 23 days- trach -reversed-no issues now   Depressed    Diabetes mellitus without complication ([place])    type 2   High cholesterol    History of colonic polyps 04/10/2018   Her colonoscopy in 2016 revealed multiple polyps.  Per patient there were found to be benign.  Morgan Collins was told to follow-up for repeat colonoscopy in 3 to 5 years.   Hypertension    Hypothyroidism    Nontraumatic subcortical hemorrhage of cerebral hemisphere ([place]) 03/08/2014   Obstructive hydrocephalus ([place]) 07/27/2013   Sleep apnea    do not use cpap   Stroke ([place]) 2015   slower ,but not a problem; right thalamic hemorrhage 07/2013   Thalamic hemorrhage ([place]) 08/04/2013   Thyroid disease     Past Surgical History:  Procedure Laterality Date   ABDOMINAL HYSTERECTOMY     APPENDECTOMY     c sections     COLONOSCOPY WITH PROPOFOL N/A 12/30/2014   Procedure: COLONOSCOPY WITH PROPOFOL;  Surgeon: Carol Ada, MD;  Location: [facility];  Service: Endoscopy;  Laterality: N/A;   COLONOSCOPY WITH PROPOFOL N/A 07/03/2018   Procedure: COLONOSCOPY WITH PROPOFOL;  Surgeon: Carol Ada, MD;  Location: [facility];  Service: Endoscopy;  Laterality: N/A;   PARATHYROIDECTOMY Right 02/18/2017   Procedure: RIGHT INFERIOR PARATHYROIDECTOMY;  Surgeon: Armandina Gemma, MD;  Location: [facility];  Service: General;  Laterality: Right;   POLYPECTOMY  07/03/2018   Procedure: POLYPECTOMY;   Surgeon: Carol Ada, MD;  Location: [facility];  Service: Endoscopy;;   TRACHEOSTOMY     past history of early 20's- encephalitis coma for 23 days-1982    Family History  Problem Relation Age of Onset   Congestive Heart Failure Mother    Hypertension Mother    Alcohol abuse Father    Cancer Sister        Breast   Dementia Brother     Social History:  reports that Morgan Collins has never smoked. Morgan Collins has never used smokeless tobacco. Morgan Collins reports that Morgan Collins does not drink alcohol and does not use drugs.  Allergies: No Known Allergies  Medications: Scheduled: Continuous:  No results found for this or any previous visit (from the past 24 hour(s)).   No results found.  ROS:  As stated above in the HPI otherwise negative.  Height 5' 1"$  (1.549 m), weight 110.7 kg.    PE: Gen: NAD, Alert and Oriented HEENT:  [place]/AT, EOMI Neck: Supple, no LAD Lungs: CTA Bilaterally CV: RRR without M/G/R ABD: Soft, NTND, +BS Ext: No C/C/E  Assessment/Plan: 1) Personal history of polyps - colonoscopy.  Jerek Meulemans D 08/02/2022, 9:56 AM

## 2022-08-02 NOTE — Anesthesia Postprocedure Evaluation (Signed)
Anesthesia Post Note  Patient: Morgan Collins  Procedure(s) Performed: COLONOSCOPY WITH PROPOFOL POLYPECTOMY     Patient location during evaluation: PACU Anesthesia Type: MAC Level of consciousness: awake and alert Pain management: pain level controlled Vital Signs Assessment: post-procedure vital signs reviewed and stable Respiratory status: spontaneous breathing, nonlabored ventilation, respiratory function stable and patient connected to nasal cannula oxygen Cardiovascular status: stable and blood pressure returned to baseline Postop Assessment: no apparent nausea or vomiting Anesthetic complications: no   No notable events documented.  Last Vitals:  Vitals:   08/02/22 1110 08/02/22 1120  BP: 113/75 134/79  Pulse: 99 83  Resp: (!) 23 (!) 23  Temp:    SpO2: 95% 97%    Last Pain:  Vitals:   08/02/22 1120  TempSrc:   PainSc: 0-No pain                 Belenda Cruise P Dacey Milberger

## 2022-08-04 ENCOUNTER — Encounter (HOSPITAL_COMMUNITY): Payer: Self-pay | Admitting: Gastroenterology

## 2022-08-06 LAB — SURGICAL PATHOLOGY

## 2023-02-11 ENCOUNTER — Other Ambulatory Visit: Payer: Self-pay | Admitting: Family

## 2023-02-11 ENCOUNTER — Encounter: Payer: Self-pay | Admitting: Family

## 2023-02-11 DIAGNOSIS — N63 Unspecified lump in unspecified breast: Secondary | ICD-10-CM

## 2023-02-20 ENCOUNTER — Ambulatory Visit
Admission: RE | Admit: 2023-02-20 | Discharge: 2023-02-20 | Disposition: A | Discharge status: Home / Self Care | Payer: Medicare PPO | Source: Ambulatory Visit | Attending: Family | Admitting: Family

## 2023-02-20 DIAGNOSIS — N63 Unspecified lump in unspecified breast: Secondary | ICD-10-CM

## 2023-06-24 DIAGNOSIS — G4733 Obstructive sleep apnea (adult) (pediatric): Secondary | ICD-10-CM | POA: Diagnosis not present

## 2023-06-30 DIAGNOSIS — I10 Essential (primary) hypertension: Secondary | ICD-10-CM | POA: Diagnosis not present

## 2023-07-15 DIAGNOSIS — R059 Cough, unspecified: Secondary | ICD-10-CM | POA: Diagnosis not present

## 2023-07-15 DIAGNOSIS — J329 Chronic sinusitis, unspecified: Secondary | ICD-10-CM | POA: Diagnosis not present

## 2023-07-15 DIAGNOSIS — R062 Wheezing: Secondary | ICD-10-CM | POA: Diagnosis not present

## 2023-09-22 ENCOUNTER — Other Ambulatory Visit: Payer: Self-pay | Admitting: Family

## 2023-09-22 DIAGNOSIS — R5381 Other malaise: Secondary | ICD-10-CM

## 2023-09-23 ENCOUNTER — Other Ambulatory Visit: Payer: Self-pay | Admitting: Family

## 2023-09-23 DIAGNOSIS — Z1231 Encounter for screening mammogram for malignant neoplasm of breast: Secondary | ICD-10-CM

## 2024-05-04 ENCOUNTER — Other Ambulatory Visit (HOSPITAL_BASED_OUTPATIENT_CLINIC_OR_DEPARTMENT_OTHER)

## 2024-05-17 ENCOUNTER — Ambulatory Visit: Payer: Self-pay | Admitting: Emergency Medicine

## 2024-05-17 ENCOUNTER — Encounter: Payer: Self-pay | Admitting: Emergency Medicine

## 2024-05-17 ENCOUNTER — Ambulatory Visit (INDEPENDENT_AMBULATORY_CARE_PROVIDER_SITE_OTHER)

## 2024-05-17 ENCOUNTER — Ambulatory Visit: Admission: EM | Admit: 2024-05-17 | Discharge: 2024-05-17 | Disposition: A | Discharge status: Home / Self Care

## 2024-05-17 DIAGNOSIS — M25361 Other instability, right knee: Secondary | ICD-10-CM

## 2024-05-17 DIAGNOSIS — M25561 Pain in right knee: Secondary | ICD-10-CM | POA: Diagnosis not present

## 2024-05-17 DIAGNOSIS — G459 Transient cerebral ischemic attack, unspecified: Secondary | ICD-10-CM | POA: Insufficient documentation

## 2024-05-17 NOTE — ED Provider Notes (Signed)
 [facility]    CSN: [id number] Arrival date & time: 05/17/24  1049      History   Chief Complaint Chief Complaint  Patient presents with   Knee Pain    HPI Morgan Collins is a 68 y.o. female.   Patient presents to clinic with her daughter over concern of right knee feelings of instability.  Might be going on for the past 3, 4 months maybe longer.  She has not had any trauma or falls.  Denies any injuries or swelling or bruising.  Has not tried any medications or interventions for this.  Reports she contacted her PCP and they advised her to get an x-ray.  She does not have any specific pain.  Feels like her knee is unstable when she goes up or down the stairs.  She is not able to bear weight solely on this knee over concern that she may fall.  The history is provided by the patient and medical records.  Knee Pain   Past Medical History:  Diagnosis Date   Complication of anesthesia    encephalitis in coma 23 days- trach -reversed-no issues now   Depressed    Diabetes mellitus without complication ([facility])    type 2   High cholesterol    History of colonic polyps 04/10/2018   Her colonoscopy in 2016 revealed multiple polyps.  Per patient there were found to be benign.  She was told to follow-up for repeat colonoscopy in 3 to 5 years.   Hypertension    Hypothyroidism    Nontraumatic subcortical hemorrhage of cerebral hemisphere ([facility]) 03/08/2014   Obstructive hydrocephalus ([facility]) 07/27/2013   Sleep apnea    do not use cpap   Stroke ([facility]) 2015   slower ,but not a problem; right thalamic hemorrhage 07/2013   Thalamic hemorrhage ([facility]) 08/04/2013   Thyroid  disease     Patient Active Problem List   Diagnosis Date Noted   Transient ischemic attack 05/17/2024   Hematuria 06/14/2021   Encephalomalacia on imaging study 05/12/2021   Parietal lobe infarction ([facility]) 05/11/2021   Iron deficiency anemia 02/23/2020   Urinary incontinence 12/28/2019   Leg swelling  09/14/2019   Carpal tunnel syndrome of right wrist 04/10/2018   History of colonic polyps 04/10/2018   OSA (obstructive sleep apnea) 09/19/2015   Type 2 diabetes mellitus without complications (H[facility]) 03/23/2015   Hyperparathyroidism, primary 02/09/2015   Vitamin D  deficiency 02/09/2015   Osteoporosis 12/28/2014   Depression 10/25/2014   HLD (hyperlipidemia) 03/08/2014   Benign hypertension 03/08/2014   Obesities, morbid (HCC) 11/22/2013   Hypothyroidism 08/18/2013    Past Surgical History:  Procedure Laterality Date   ABDOMINAL HYSTERECTOMY     APPENDECTOMY     c sections     COLONOSCOPY WITH PROPOFOL  N/A 12/30/2014   Procedure: COLONOSCOPY WITH PROPOFOL ;  Surgeon: Belvie Just, MD;  Location: [facility];  Service: Endoscopy;  Laterality: N/A;   COLONOSCOPY WITH PROPOFOL  N/A 07/03/2018   Procedure: COLONOSCOPY WITH PROPOFOL ;  Surgeon: Just Belvie, MD;  Location: [facility];  Service: Endoscopy;  Laterality: N/A;   COLONOSCOPY WITH PROPOFOL  N/A 08/02/2022   Procedure: COLONOSCOPY WITH PROPOFOL ;  Surgeon: Just Belvie, MD;  Location: [facility];  Service: Gastroenterology;  Laterality: N/A;   PARATHYROIDECTOMY Right 02/18/2017   Procedure: RIGHT INFERIOR PARATHYROIDECTOMY;  Surgeon: Eletha Boas, MD;  Location: [facility] OR;  Service: General;  Laterality: Right;   POLYPECTOMY  07/03/2018   Procedure: POLYPECTOMY;  Surgeon: Just Belvie, MD;  [facility];  Service: Endoscopy;;   POLYPECTOMY  08/02/2022   Procedure: POLYPECTOMY;  Surgeon: Rollin Dover, MD;  [facility]: [facility];  Service: Gastroenterology;;   TRACHEOSTOMY     past history of early 20's- encephalitis coma for 23 days-1982    OB History   No obstetric history on file.      Home Medications    Prior to Admission medications   Medication Sig Start Date End Date Taking? Authorizing Provider  ascorbic acid (VITAMIN C) 500 MG tablet Take 500 mg by mouth daily.   Yes [provider]  atorvastatin   (LIPITOR ) 80 MG tablet Take 1 tablet (80 mg total) by mouth daily. Patient taking differently: Take 80 mg by mouth every evening. 01/31/21  Yes Rosendo Norleen BROCKS, MD  Calcium -Magnesium-Zinc (CAL-MAG-ZINC PO) Take 1 tablet by mouth daily.   Yes [provider]  cholecalciferol (VITAMIN D3) 25 MCG (1000 UNIT) tablet Take 1,000 Units by mouth daily. 01/14/24  Yes [provider]  citalopram  (CELEXA ) 40 MG tablet Take 0.5 tablets (20 mg total) by mouth every evening. Patient taking differently: Take 40 mg by mouth every evening. 01/31/21  Yes Rosendo Norleen BROCKS, MD  clopidogrel  (PLAVIX ) 75 MG tablet TAKE 1 TABLET BY MOUTH EVERY DAY 10/16/21  Yes Rosendo Norleen BROCKS, MD  Cyanocobalamin (B-12 PO) Take 1,000 mcg by mouth daily.   Yes [provider]  ezetimibe  (ZETIA ) 10 MG tablet TAKE 1 TABLET BY MOUTH EVERY DAY Patient taking differently: Take 10 mg by mouth every evening. 10/12/21  Yes Rosendo Norleen BROCKS, MD  ferrous sulfate  [facility]) 325 (65 FE) MG tablet Take 1 tablet (325 mg total) by mouth daily with breakfast. 02/02/21 05/17/24 Yes Norbert, John C, MD  glipiZIDE 2.5 MG TABS Take 1 tablet by mouth daily. 05/06/24  Yes [provider]  hydrochlorothiazide  (HYDRODIURIL ) 12.5 MG tablet Take 12.5 mg by mouth in the morning. 07/06/22  Yes [provider]  levothyroxine  (SYNTHROID ) 112 MCG tablet TAKE 1 TABLET IN THE MORNING 30 MIN BEFORE FOOD 11/22/21  Yes Rosendo Norleen BROCKS, MD  losartan  (COZAAR ) 100 MG tablet Take 1 tablet (100 mg total) by mouth daily. 01/31/21  Yes Rosendo Norleen BROCKS, MD  metFORMIN  (GLUCOPHAGE ) 500 MG tablet Take 1 tablet (500 mg total) by mouth 2 (two) times daily with a meal. 01/31/21  Yes Rosendo Norleen BROCKS, MD  albuterol  (VENTOLIN  HFA) 108 (90 Base) MCG/ACT inhaler Inhale 1-2 puffs into the lungs every 6 (six) hours as needed for wheezing or shortness of breath. Patient not taking: Reported on 05/17/2024 07/10/22   [provider]  ECHINACEA PO Take 1 capsule  by mouth daily. Patient not taking: Reported on 05/17/2024    [provider]  fluticasone (FLONASE) 50 MCG/ACT nasal spray Place 2 sprays into both nostrils daily as needed for allergies. Patient not taking: Reported on 05/17/2024 06/27/22   [provider]  Multiple Vitamin (MULTIVITAMIN WITH MINERALS) TABS tablet Take 1 tablet by mouth in the morning. Patient not taking: Reported on 05/17/2024    [provider]  SUPREP BOWEL PREP KIT 17.5-3.13-1.6 GM/177ML SOLN See admin instructions. Patient not taking: Reported on 05/17/2024 03/21/22   [provider]  tiZANidine (ZANAFLEX) 2 MG tablet Take 2 mg by mouth 3 (three) times daily as needed for muscle spasms. Patient not taking: Reported on 05/17/2024 07/24/22   [provider]    Family History Family History  Problem Relation Age of Onset   Congestive Heart Failure Mother    Hypertension  Mother    Alcohol abuse Father    Breast cancer Sister    Dementia Brother     Social History Social History   Tobacco Use   Smoking status: Never   Smokeless tobacco: Never  Vaping Use   Vaping status: Never Used  Substance Use Topics   Alcohol use: No   Drug use: No     Allergies   Patient has no known allergies.   Review of Systems Review of Systems  Per HPI  Physical Exam Triage Vital Signs ED Triage Vitals  Encounter Vitals Group     BP 05/17/24 1144 117/80     Girls Systolic BP Percentile --      Girls Diastolic BP Percentile --      Boys Systolic BP Percentile --      Boys Diastolic BP Percentile --      Pulse Rate 05/17/24 1144 77     Resp 05/17/24 1144 14     Temp 05/17/24 1144 97.8 F (36.6 C)     Temp Source 05/17/24 1144 Oral     SpO2 05/17/24 1144 98 %     Weight --      Height --      Head Circumference --      Peak Flow --      Pain Score 05/17/24 1145 0     Pain Loc --      Pain Education --      Exclude from Growth Chart --    No data found.  Updated Vital  Signs BP 117/80 (BP Location: Left Arm)   Pulse 77   Temp 97.8 F (36.6 C) (Oral)   Resp 14   SpO2 98%   Visual Acuity Right Eye Distance:   Left Eye Distance:   Bilateral Distance:    Right Eye Near:   Left Eye Near:    Bilateral Near:     Physical Exam Vitals and nursing note reviewed.  Constitutional:      Appearance: Normal appearance.  HENT:     Head: Normocephalic and atraumatic.     Right Ear: External ear normal.     Left Ear: External ear normal.     Nose: Nose normal.     Mouth/Throat:     Mouth: Mucous membranes are moist.  Eyes:     Conjunctiva/sclera: Conjunctivae normal.  Cardiovascular:     Rate and Rhythm: Normal rate.  Pulmonary:     Effort: Pulmonary effort is normal. No respiratory distress.  Musculoskeletal:        General: No swelling, tenderness or signs of injury.     Right knee: No swelling, deformity or bony tenderness. Normal range of motion. No tenderness.     Instability Tests: Anterior drawer test negative. Posterior drawer test negative.  Skin:    General: Skin is warm and dry.  Neurological:     General: No focal deficit present.     Mental Status: She is alert and oriented to person, place, and time.  Psychiatric:        Mood and Affect: Mood normal.        Behavior: Behavior normal. Behavior is cooperative.      UC Treatments / Results  Labs (all labs ordered are listed, but only abnormal results are displayed) Labs Reviewed - No data to display  EKG   Radiology No results found.  Procedures Procedures (including critical care time)  Medications Ordered in UC Medications - No data to display  Initial Impression / Assessment and Plan / UC Course  I have reviewed the triage vital signs and the nursing notes.  Pertinent labs & imaging results that were available during my care of the patient were reviewed by me and considered in my medical decision making (see chart for details).  Vitals and triage reviewed, patient  is hemodynamically stable.  Right knee with full range of motion.  Negative anterior and posterior drawer.  Without tenderness or swelling.  Imaging by my interpretation does not show acute bony abnormality.  Unclear etiology to concern over knee instability.  Encouraged orthopedic follow-up, can consider physical therapy and advanced imaging as needed.  Knee sleeve given in clinic for support.  Plan of care, follow-up care return precautions given, no questions at this time.   Final Clinical Impressions(s) / UC Diagnoses   Final diagnoses:  Right knee pain, unspecified chronicity  Knee instability, right     Discharge Instructions      I did not see any obvious bony abnormalities with your x-rays.  Wear the sleeve to help provide support when going up and down the steps.  Please follow-up with orthopedic for further evaluation of your knee pain and instability.  You can take 500 mg of Tylenol  every 6-8 hours if you develop any pain.  Return to clinic for any new or urgent symptoms.    ED Prescriptions   None    PDMP not reviewed this encounter.   Dreama Sena SAILOR, FNP 05/17/24 1228

## 2024-05-17 NOTE — ED Notes (Signed)
 Ace wrap provided with instruction. Patient will place on knee (if needed) and follow up with Ortho. Clinic. Advised [facility] location for walk in (M-F, 8-5).

## 2024-05-17 NOTE — ED Triage Notes (Addendum)
 Pt reports R knee issue x3-4 months. Denies pain in the knee. Denies swelling or bruising. The issue is with going up stairs - states when she tries to get her R knee going it just gives out with total weakness. Cannot put pressure on the R knee when stepping on stair. Unable to lift up onto the stairs. Pt reports no issues with any other movements or walking. No medication use, ice, or heat for symptoms. Pt requests xray of the R knee - [facility] and asked her to come for xray.

## 2024-05-17 NOTE — Discharge Instructions (Signed)
 I did not see any obvious bony abnormalities with your x-rays.  Wear the sleeve to help provide support when going up and down the steps.  Please follow-up with orthopedic for further evaluation of your knee pain and instability.  You can take 500 mg of Tylenol  every 6-8 hours if you develop any pain.  Return to clinic for any new or urgent symptoms.

## 2024-05-17 NOTE — Progress Notes (Signed)
 Mild osteoarthritis.  No acute bony abnormalities.  Continue treatment plan.

## 2024-05-18 ENCOUNTER — Ambulatory Visit

## 2024-07-02 ENCOUNTER — Ambulatory Visit

## 2024-07-02 ENCOUNTER — Ambulatory Visit
Admission: RE | Admit: 2024-07-02 | Discharge: 2024-07-02 | Disposition: A | Discharge status: Home / Self Care | Source: Ambulatory Visit | Attending: Family | Admitting: Family

## 2024-07-02 ENCOUNTER — Other Ambulatory Visit

## 2024-07-02 DIAGNOSIS — Z1231 Encounter for screening mammogram for malignant neoplasm of breast: Secondary | ICD-10-CM
